# Patient Record
Sex: Female | Born: 1935 | Race: Black or African American | Hispanic: No | Marital: Single | Smoking: Former smoker
Health system: Southern US, Community
[De-identification: ages and names within clinical notes are randomized; demographics above are authoritative.]

## PROBLEM LIST (undated history)

## (undated) DIAGNOSIS — I639 Cerebral infarction, unspecified: Secondary | ICD-10-CM

## (undated) DIAGNOSIS — F32A Depression, unspecified: Secondary | ICD-10-CM

## (undated) DIAGNOSIS — F419 Anxiety disorder, unspecified: Secondary | ICD-10-CM

## (undated) DIAGNOSIS — J45909 Unspecified asthma, uncomplicated: Secondary | ICD-10-CM

## (undated) DIAGNOSIS — M069 Rheumatoid arthritis, unspecified: Secondary | ICD-10-CM

## (undated) DIAGNOSIS — K219 Gastro-esophageal reflux disease without esophagitis: Secondary | ICD-10-CM

## (undated) DIAGNOSIS — M810 Age-related osteoporosis without current pathological fracture: Secondary | ICD-10-CM

## (undated) DIAGNOSIS — T7840XA Allergy, unspecified, initial encounter: Secondary | ICD-10-CM

## (undated) DIAGNOSIS — I10 Essential (primary) hypertension: Secondary | ICD-10-CM

## (undated) DIAGNOSIS — F329 Major depressive disorder, single episode, unspecified: Secondary | ICD-10-CM

## (undated) DIAGNOSIS — E7800 Pure hypercholesterolemia, unspecified: Secondary | ICD-10-CM

## (undated) DIAGNOSIS — H9190 Unspecified hearing loss, unspecified ear: Secondary | ICD-10-CM

## (undated) DIAGNOSIS — M1990 Unspecified osteoarthritis, unspecified site: Secondary | ICD-10-CM

## (undated) HISTORY — DX: Unspecified osteoarthritis, unspecified site: M19.90

## (undated) HISTORY — PX: REPLACEMENT TOTAL KNEE BILATERAL: SUR1225

## (undated) HISTORY — DX: Allergy, unspecified, initial encounter: T78.40XA

## (undated) HISTORY — DX: Age-related osteoporosis without current pathological fracture: M81.0

## (undated) HISTORY — PX: WRIST SURGERY: SHX841

## (undated) HISTORY — PX: KNEE ARTHROPLASTY: SHX992

## (undated) HISTORY — DX: Anxiety disorder, unspecified: F41.9

## (undated) HISTORY — DX: Unspecified hearing loss, unspecified ear: H91.90

## (undated) HISTORY — DX: Rheumatoid arthritis, unspecified: M06.9

## (undated) HISTORY — DX: Unspecified asthma, uncomplicated: J45.909

## (undated) HISTORY — DX: Depression, unspecified: F32.A

## (undated) HISTORY — DX: Major depressive disorder, single episode, unspecified: F32.9

## (undated) HISTORY — PX: ABDOMINAL HYSTERECTOMY: SHX81

---

## 2014-10-02 ENCOUNTER — Other Ambulatory Visit: Payer: Self-pay

## 2014-10-02 DIAGNOSIS — Z1231 Encounter for screening mammogram for malignant neoplasm of breast: Secondary | ICD-10-CM

## 2014-10-22 ENCOUNTER — Emergency Department (INDEPENDENT_AMBULATORY_CARE_PROVIDER_SITE_OTHER)
Admission: EM | Admit: 2014-10-22 | Discharge: 2014-10-22 | Disposition: A | Discharge status: Home / Self Care | Payer: Medicare Other | Source: Home / Self Care | Attending: Family Medicine | Admitting: Family Medicine

## 2014-10-22 ENCOUNTER — Encounter (HOSPITAL_COMMUNITY): Payer: Self-pay

## 2014-10-22 DIAGNOSIS — K0500 Acute gingivitis, plaque induced: Secondary | ICD-10-CM | POA: Diagnosis not present

## 2014-10-22 HISTORY — DX: Cerebral infarction, unspecified: I63.9

## 2014-10-22 HISTORY — DX: Gastro-esophageal reflux disease without esophagitis: K21.9

## 2014-10-22 HISTORY — DX: Essential (primary) hypertension: I10

## 2014-10-22 HISTORY — DX: Pure hypercholesterolemia, unspecified: E78.00

## 2014-10-22 MED ORDER — CHLORHEXIDINE GLUCONATE 0.12 % MT SOLN: 15.0000 mL | Freq: Four times a day (QID) | OROMUCOSAL | Status: DC

## 2014-10-22 MED ORDER — CLINDAMYCIN HCL 150 MG PO CAPS: 150.0000 mg | ORAL_CAPSULE | Freq: Four times a day (QID) | ORAL | Status: DC

## 2014-10-22 NOTE — Discharge Instructions (Signed)
Take medicine as prescribed, see your dentist as soon as possible °

## 2014-10-22 NOTE — ED Provider Notes (Signed)
CSN: [id number]     Arrival date & time 10/22/14  1433 History   First MD Initiated Contact with Patient 10/22/14 1503     Chief Complaint  Patient presents with  . Dental Pain   (Consider location/radiation/quality/duration/timing/severity/associated sxs/prior Treatment) Patient is a 81 y.o. female presenting with tooth pain. The history is provided by the patient and a relative.  Dental Pain Location:  Upper Upper teeth location:  6/RU cuspid Quality:  Burning and throbbing Severity:  Mild Onset quality:  Sudden Duration:  4 days Progression:  Worsening Chronicity:  New Context: poor dentition   Context comment:  Denture polygrip irritation of gum trying to remove., now gum swollen.   Past Medical History  Diagnosis Date  . GERD (gastroesophageal reflux disease)   . Hypertension   . Stroke   . High cholesterol    History reviewed. No pertinent past surgical history. History reviewed. No pertinent family history. History  Substance Use Topics  . Smoking status: Never Smoker   . Smokeless tobacco: Not on file  . Alcohol Use: Not on file   OB History    No data available     Review of Systems  Constitutional: Negative.   HENT: Positive for dental problem.     Allergies  Penicillins  Home Medications   Prior to Admission medications   Medication Sig Start Date End Date Taking? Authorizing Provider  lisinopril (PRINIVIL,ZESTRIL) 10 MG tablet Take 10 mg by mouth daily.   Yes Historical Provider, MD  omeprazole (PRILOSEC) 10 MG capsule Take 10 mg by mouth daily.   Yes Historical Provider, MD  simvastatin (ZOCOR) 10 MG tablet Take 10 mg by mouth daily.   Yes Historical Provider, MD  thyroid (ARMOUR) 120 MG tablet Take 120 mg by mouth daily before breakfast.   Yes Historical Provider, MD  chlorhexidine (PERIDEX) 0.12 % solution Use as directed 15 mLs in the mouth or throat 4 (four) times daily. 10/22/14   Linna Hoff, MD  clindamycin (CLEOCIN) 150 MG capsule Take 1  capsule (150 mg total) by mouth 4 (four) times daily. 10/22/14   Linna Hoff, MD   BP 140/78 mmHg  Pulse 63  Temp(Src) 98.2 F (36.8 C) (Oral)  Resp 16  SpO2 100% Physical Exam  Constitutional: She appears well-developed and well-nourished. No distress.  HENT:  Right Ear: External ear normal.  Left Ear: External ear normal.  Mouth/Throat:    Nursing note and vitals reviewed.   ED Course  Procedures (including critical care time) Labs Review Labs Reviewed - No data to display  Imaging Review No results found.   MDM   1. Acute gingivitis        Linna Hoff, MD 10/22/14 1538

## 2014-10-22 NOTE — ED Notes (Signed)
Recently relocated  to area, but has no dentist. Has a lot of dental pain

## 2014-10-29 ENCOUNTER — Ambulatory Visit
Admission: RE | Admit: 2014-10-29 | Discharge: 2014-10-29 | Disposition: A | Discharge status: Home / Self Care | Payer: Medicare Other | Source: Ambulatory Visit

## 2014-10-29 DIAGNOSIS — Z1231 Encounter for screening mammogram for malignant neoplasm of breast: Secondary | ICD-10-CM

## 2015-03-31 ENCOUNTER — Emergency Department (HOSPITAL_COMMUNITY): Payer: Medicare Other

## 2015-03-31 ENCOUNTER — Emergency Department (HOSPITAL_COMMUNITY)
Admission: EM | Admit: 2015-03-31 | Discharge: 2015-03-31 | Disposition: A | Discharge status: Home / Self Care | Payer: Medicare Other | Attending: Emergency Medicine | Admitting: Emergency Medicine

## 2015-03-31 ENCOUNTER — Encounter (HOSPITAL_COMMUNITY): Payer: Self-pay | Admitting: Emergency Medicine

## 2015-03-31 DIAGNOSIS — H409 Unspecified glaucoma: Secondary | ICD-10-CM | POA: Diagnosis not present

## 2015-03-31 DIAGNOSIS — Z880 Allergy status to penicillin: Secondary | ICD-10-CM | POA: Diagnosis not present

## 2015-03-31 DIAGNOSIS — R51 Headache: Secondary | ICD-10-CM | POA: Diagnosis not present

## 2015-03-31 DIAGNOSIS — Z8673 Personal history of transient ischemic attack (TIA), and cerebral infarction without residual deficits: Secondary | ICD-10-CM | POA: Insufficient documentation

## 2015-03-31 DIAGNOSIS — J45901 Unspecified asthma with (acute) exacerbation: Secondary | ICD-10-CM | POA: Insufficient documentation

## 2015-03-31 DIAGNOSIS — R079 Chest pain, unspecified: Secondary | ICD-10-CM | POA: Diagnosis not present

## 2015-03-31 DIAGNOSIS — R109 Unspecified abdominal pain: Secondary | ICD-10-CM | POA: Diagnosis not present

## 2015-03-31 DIAGNOSIS — R0789 Other chest pain: Secondary | ICD-10-CM | POA: Diagnosis not present

## 2015-03-31 DIAGNOSIS — E782 Mixed hyperlipidemia: Secondary | ICD-10-CM | POA: Insufficient documentation

## 2015-03-31 DIAGNOSIS — Z79899 Other long term (current) drug therapy: Secondary | ICD-10-CM | POA: Insufficient documentation

## 2015-03-31 DIAGNOSIS — I10 Essential (primary) hypertension: Secondary | ICD-10-CM | POA: Insufficient documentation

## 2015-03-31 DIAGNOSIS — R0602 Shortness of breath: Secondary | ICD-10-CM | POA: Diagnosis not present

## 2015-03-31 DIAGNOSIS — Z7982 Long term (current) use of aspirin: Secondary | ICD-10-CM | POA: Insufficient documentation

## 2015-03-31 DIAGNOSIS — R001 Bradycardia, unspecified: Secondary | ICD-10-CM | POA: Insufficient documentation

## 2015-03-31 DIAGNOSIS — K219 Gastro-esophageal reflux disease without esophagitis: Secondary | ICD-10-CM | POA: Diagnosis not present

## 2015-03-31 LAB — I-STAT ARTERIAL BLOOD GAS, ED
Bicarbonate: 23.3 mEq/L (ref 20.0–24.0)
O2 Saturation: 98 %
TCO2: 24 mmol/L (ref 0–100)
pCO2 arterial: 32.5 mmHg — ABNORMAL LOW (ref 35.0–45.0)
pH, Arterial: 7.464 — ABNORMAL HIGH (ref 7.350–7.450)
pO2, Arterial: 103 mmHg — ABNORMAL HIGH (ref 80.0–100.0)

## 2015-03-31 LAB — CBC
HCT: 30 % — ABNORMAL LOW (ref 36.0–46.0)
Hemoglobin: 9.8 g/dL — ABNORMAL LOW (ref 12.0–15.0)
MCH: 21.3 pg — ABNORMAL LOW (ref 26.0–34.0)
MCHC: 32.7 g/dL (ref 30.0–36.0)
MCV: 65.1 fL — ABNORMAL LOW (ref 78.0–100.0)
Platelets: 406 10*3/uL — ABNORMAL HIGH (ref 150–400)
RBC: 4.61 MIL/uL (ref 3.87–5.11)
RDW: 18.4 % — ABNORMAL HIGH (ref 11.5–15.5)
WBC: 8.5 10*3/uL (ref 4.0–10.5)

## 2015-03-31 LAB — BASIC METABOLIC PANEL
Anion gap: 8 (ref 5–15)
BUN: 16 mg/dL (ref 6–20)
CO2: 26 mmol/L (ref 22–32)
Calcium: 9.4 mg/dL (ref 8.9–10.3)
Chloride: 102 mmol/L (ref 101–111)
Creatinine, Ser: 1.45 mg/dL — ABNORMAL HIGH (ref 0.44–1.00)
GFR calc Af Amer: 39 mL/min — ABNORMAL LOW (ref >60)
GFR calc non Af Amer: 33 mL/min — ABNORMAL LOW (ref >60)
Glucose, Bld: 78 mg/dL (ref 65–99)
Potassium: 4.7 mmol/L (ref 3.5–5.1)
Sodium: 136 mmol/L (ref 135–145)

## 2015-03-31 LAB — TROPONIN I
Troponin I: <0.03 ng/mL (ref <0.031)
Troponin I: <0.03 ng/mL (ref <0.031)

## 2015-03-31 LAB — D-DIMER, QUANTITATIVE: D-Dimer, Quant: 5.05 ug/mL-FEU — ABNORMAL HIGH (ref 0.00–0.48)

## 2015-03-31 MED ORDER — HYDROCODONE-ACETAMINOPHEN 5-325 MG PO TABS
1.0000 | ORAL_TABLET | Freq: Once | ORAL | Status: AC
  Administered 2015-03-31: 1 via ORAL
  Filled 2015-03-31: qty 1

## 2015-03-31 MED ORDER — HYDROCODONE-ACETAMINOPHEN 7.5-325 MG PO TABS: 1.0000 | ORAL_TABLET | Freq: Four times a day (QID) | ORAL | Status: DC | PRN

## 2015-03-31 MED ORDER — IOHEXOL 350 MG/ML SOLN
100.0000 mL | Freq: Once | INTRAVENOUS | Status: AC | PRN
  Administered 2015-03-31: 80 mL via INTRAVENOUS

## 2015-03-31 NOTE — ED Provider Notes (Signed)
CSN: [id number]     Arrival date & time 03/31/15  0731 History   First MD Initiated Contact with Patient 03/31/15 [phone]     Chief Complaint  Patient presents with  . Chest Pain   HPI:  Patient is a 81 y.o. female presenting with chest pain.  Chest Pain Pain location:  Substernal area Pain quality: pressure and sharp   Pain radiates to:  Does not radiate Pain radiates to the back: yes   Pain severity:  Mild Onset quality:  Sudden Duration:  1 day Timing:  Constant Chronicity:  New Context: at rest   Worsened by:  Coughing Associated symptoms: abdominal pain, cough, fatigue, headache and shortness of breath   Associated symptoms: no fever   Associated symptoms comment:  Cold Chest pain was relieved by Norco.  Patient does have history of GERD and takes medication daily. However, states that symptoms are still present despite medications.   Of note, patient says she has been stressed lately due to lifestyle changes. She lost her vision and is no longer able to dribve due to glaucoma.   Past Medical History  Diagnosis Date  . GERD (gastroesophageal reflux disease)   . Hypertension   . Stroke   . High cholesterol   Rheumatoid arthritis Asthma Glaucoma  History reviewed. No pertinent past surgical history. History reviewed. No pertinent family history. Social History  Substance Use Topics  . Smoking status: Never Smoker   . Smokeless tobacco: None  . Alcohol Use: No   OB History    No data available     Review of Systems  Constitutional: Positive for fatigue. Negative for fever.  Respiratory: Positive for cough and shortness of breath.   Cardiovascular: Positive for chest pain.  Gastrointestinal: Positive for abdominal pain.  Neurological: Positive for headaches.  Also per HPI  Allergies  Penicillins  Home Medications   Prior to Admission medications   Medication Sig Start Date End Date Taking? Authorizing Provider  albuterol (PROVENTIL HFA;VENTOLIN HFA) 108  (90 BASE) MCG/ACT inhaler Inhale 2 puffs into the lungs every 6 (six) hours as needed for wheezing or shortness of breath.   Yes Historical Provider, MD  aspirin 81 MG tablet Take 81 mg by mouth daily.   Yes Historical Provider, MD  brimonidine-timolol (COMBIGAN) 0.2-0.5 % ophthalmic solution Place 1 drop into both eyes every 12 (twelve) hours.   Yes Historical Provider, MD  Cholecalciferol (VITAMIN D PO) Take 1 tablet by mouth daily.   Yes Historical Provider, MD  etanercept (ENBREL) 50 MG/ML injection Inject 50 mg into the skin once a week.   Yes Historical Provider, MD  fosinopril (MONOPRIL) 40 MG tablet Take 40 mg by mouth daily.   Yes Historical Provider, MD  HYDROcodone-acetaminophen (NORCO) 7.5-325 MG per tablet Take 1 tablet by mouth every 6 (six) hours as needed for moderate pain.   Yes Historical Provider, MD  ibuprofen (ADVIL,MOTRIN) 200 MG tablet Take 800 mg by mouth every 6 (six) hours as needed (pain).   Yes Historical Provider, MD  latanoprost (XALATAN) 0.005 % ophthalmic solution Place 1 drop into both eyes at bedtime.   Yes Historical Provider, MD  levothyroxine (SYNTHROID, LEVOTHROID) 75 MCG tablet Take 75 mcg by mouth daily before breakfast.   Yes Historical Provider, MD  memantine (NAMENDA) 10 MG tablet Take 10 mg by mouth 2 (two) times daily.   Yes Historical Provider, MD  Multiple Vitamins-Minerals (MULTIVITAL PO) Take 1 tablet by mouth daily.   Yes Historical Provider, MD  omeprazole (PRILOSEC) 20 MG capsule Take 20 mg by mouth daily.   Yes Historical Provider, MD  simvastatin (ZOCOR) 40 MG tablet Take 40 mg by mouth daily.   Yes Historical Provider, MD   BP 145/61 mmHg  Pulse 54  Temp(Src) 97.3 F (36.3 C) (Oral)  Resp 15  SpO2 100% Physical Exam  Constitutional: She is oriented to person, place, and time. She appears well-developed and well-nourished. No distress.  Cardiovascular: Regular rhythm, normal heart sounds and normal pulses.  Bradycardia present.    Pulmonary/Chest: Effort normal and breath sounds normal. She exhibits tenderness.  Abdominal: Soft. Bowel sounds are normal. There is no splenomegaly or hepatomegaly. There is tenderness.  Musculoskeletal: Normal range of motion. She exhibits no edema.  Neurological: She is alert and oriented to person, place, and time.  Skin: Skin is warm and dry.    ED Course  Procedures (including critical care time) Labs Review Labs Reviewed  BASIC METABOLIC PANEL - Abnormal; Notable for the following:    Creatinine, Ser 1.45 (*)    GFR calc non Af Amer 33 (*)    GFR calc Af Amer 39 (*)    All other components within normal limits  CBC - Abnormal; Notable for the following:    Hemoglobin 9.8 (*)    HCT 30.0 (*)    MCV 65.1 (*)    MCH 21.3 (*)    RDW 18.4 (*)    Platelets 406 (*)    All other components within normal limits  D-DIMER, QUANTITATIVE (NOT AT [facility]) - Abnormal; Notable for the following:    D-Dimer, Quant 5.05 (*)    All other components within normal limits  I-STAT ARTERIAL BLOOD GAS, ED - Abnormal; Notable for the following:    pH, Arterial 7.464 (*)    pCO2 arterial 32.5 (*)    pO2, Arterial 103.0 (*)    All other components within normal limits  TROPONIN I    Imaging Review Dg Chest 2 View  03/31/2015   CLINICAL DATA:  Mid chest pain.  Shortness of breath.  EXAM: CHEST  2 VIEW  COMPARISON:  None.  FINDINGS: Enlarged cardiac silhouette and mediastinal contours with atherosclerotic plaque within the thoracic aorta. There is mild diffuse slightly nodular thickening of the pulmonary interstitium. There is a punctate (approximately 4 mm) nodular opacity overlying the peripheral aspect the right mid lung. No focal airspace opacities. There is mild elevation/eventration the anterior aspect the right hemidiaphragm. No pleural effusion or pneumothorax. No evidence of edema. No acute osseus abnormalities.  IMPRESSION: 1. Mild bronchitic change without acute cardiopulmonary disease. 2.  Indeterminate punctate (approximately 4 mm) nodular opacity overlying the peripheral aspect the right mid lung - potentially artifactual secondary to the confluence of overlying osseous structures though a discrete underlying pulmonary nodule is not excluded. Correlation with prior outside examinations (if available) is recommended. Otherwise, follow-up chest CT is recommended. These results will be called to the ordering clinician or representative by the Radiologist Assistant, and communication documented in the PACS or zVision Dashboard.   Electronically Signed   By: Simonne Come M.D.   On: 03/31/2015 09:55   I have personally reviewed and evaluated these images and lab results as part of my medical decision-making.   EKG Interpretation   Date/Time:  Tuesday March 31 2015 07:45:19 EDT Ventricular Rate:  46 PR Interval:  118 QRS Duration: 78 QT Interval:  472 QTC Calculation: 413 R Axis:   66 Text Interpretation:  Sinus bradycardia Borderline short  PR interval  Minimal ST elevation, inferior leads No old tracing to compare Confirmed  by CAMPOS  MD, KEVIN ([id number]) on 03/31/2015 7:53:01 AM      MDM   Final diagnoses:  Chest pain, unspecified chest pain type   Patient with chest pain. R/o ACS with negative troponin x2 and EKG without ischemic changes. CXR with no signs of active cardiopulmonary disease; does have signs of bronchitic changes and possible pulmonary nodule. D-dimer returned elevated. CT Angio ordered to r/o PE. Patient initially thought to be hypoxia but was not. Ambulated without drop in oxygen saturation.   Patient was noted to have anemia on blood work consistent with iron deficiency. Other labs unremarkable. Vitals stable.   Will send her home with list of providers in area so patient can follow-up with a PCP.    Caryl Ada, DO PGY-2, [facility]      Pincus Large, DO 03/31/15 1527  Azalia Bilis, MD 03/31/15 [phone]

## 2015-03-31 NOTE — ED Notes (Signed)
Pt given warm packs for hands -- pulse ox increased to 100% on room air.

## 2015-03-31 NOTE — Discharge Instructions (Signed)
Chest Pain (Nonspecific) It is often hard to give a diagnosis for the cause of chest pain. There is always a chance that your pain could be related to something serious, such as a heart attack or a blood clot in the lungs. You need to follow up with your doctor. HOME CARE  If antibiotic medicine was given, take it as directed by your doctor. Finish the medicine even if you start to feel better.  For the next few days, avoid activities that bring on chest pain. Continue physical activities as told by your doctor.  Do not use any tobacco products. This includes cigarettes, chewing tobacco, and e-cigarettes.  Avoid drinking alcohol.  Only take medicine as told by your doctor.  Follow your doctor's suggestions for more testing if your chest pain does not go away.  Keep all doctor visits you made. GET HELP IF:  Your chest pain does not go away, even after treatment.  You have a rash with blisters on your chest.  You have a fever. GET HELP RIGHT AWAY IF:   You have more pain or pain that spreads to your arm, neck, jaw, back, or belly (abdomen).  You have shortness of breath.  You cough more than usual or cough up blood.  You have very bad back or belly pain.  You feel sick to your stomach (nauseous) or throw up (vomit).  You have very bad weakness.  You pass out (faint).  You have chills. This is an emergency. Do not wait to see if the problems will go away. Call your local emergency services (911 in U.S.). Do not drive yourself to the hospital. MAKE SURE YOU:   Understand these instructions.  Will watch your condition.  Will get help right away if you are not doing well or get worse. Document Released: 12/28/2007 Document Revised: 07/16/2013 Document Reviewed: 12/28/2007 [facility] Patient Information 2015 [place], [place]. This information is not intended to replace advice given to you by your health care provider. Make sure you discuss any questions you have with  your health care provider.    Emergency Department Resource Guide 1) Find a Doctor and Pay Out of Pocket Although you won't have to find out who is covered by your insurance plan, it is a good idea to ask around and get recommendations. You will then need to call the office and see if the doctor you have chosen will accept you as a new patient and what types of options they offer for patients who are self-pay. Some doctors offer discounts or will set up payment plans for their patients who do not have insurance, but you will need to ask so you aren't surprised when you get to your appointment.  2) Contact Your Local Health Department Not all health departments have doctors that can see patients for sick visits, but many do, so it is worth a call to see if yours does. If you don't know where your local health department is, you can check in your phone book. The CDC also has a tool to help you locate your state's health department, and many state websites also have listings of all of their local health departments.  3) Find a Walk-in Clinic If your illness is not likely to be very severe or complicated, you may want to try a walk in clinic. These are popping up all over the country in pharmacies, drugstores, and shopping centers. They're usually staffed by nurse practitioners or physician assistants that have been  trained to treat common illnesses and complaints. They're usually fairly quick and inexpensive. However, if you have serious medical issues or chronic medical problems, these are probably not your best option.  No Primary Care Doctor: - Call Health Connect at  [phone] - they can help you locate a primary care doctor that  accepts your insurance, provides certain services, etc. - Physician Referral Service- [phone]  Chronic Pain Problems: Organization         Address  Phone   Notes  [facility]  [phone] Patients need to be referred by their primary care  doctor.   Medication Assistance: Organization         Address  Phone   Notes  [facility] Medication [facility] [address] [place], [place] [address] [phone] --Must be a resident of [facility] -- Must have NO insurance coverage whatsoever (no Medicaid/ Medicare, etc.) -- The pt. MUST have a primary care doctor that directs their care regularly and follows them in the community   [facility]  [phone]   [employer]  [phone]    Agencies that provide inexpensive medical care: Organization         Address  Phone   Notes  [facility]  [phone]   [facility]    [phone]   [facility] [address] [place], [place] [address] [phone]   [facility] [address] [place] [place] [phone]   Planned Parenthood    [phone]   [facility]    [phone]   [facility]  [address], [place] Phone:  [phone], Fax:  [phone] Hours of Operation:  9 am - 6 pm, M-F.  Also accepts Medicaid/Medicare and self-pay.  [facility]  [address], [place] Phone: [phone], Fax: [phone]. Hours of Operation:  8:30 am - 5:30 pm, M-F.  Also accepts Medicaid and self-pay.  [facility] [place] [address] [place] Phone: [phone]   [facility] [address] Natasha Bence [place], [place] [phone], Ext. [phone] Mondays & Thursdays: 7-9 AM.  First 15 patients are seen on a first come, first serve basis.    Medicaid-accepting [facility] Providers:  Organization         Address  Phone   Notes  [facility] [address], [place] [phone] Also accepts self-pay patients.  [facility] [address] Laurell Josephs [place], [place]  [phone]   [facility] [address], [place] [phone]   [facility] [address], [place] [phone]   Renaye Rakers [address], [place]   [phone] Only accepts [place] Access [place] patients after they have their name applied to their card.   Self-Pay (no insurance) in [facility]:  Organization         Address  Phone   Notes  Sickle Cell Patients, [facility] Internal Medicine [address] [place], [place] [phone]   [facility] [address] [place], [place] [phone]   [facility] [place]  [address], [place] [phone]   [facility]/Dr. Osei-Bonsu  [address], [place] or [place]  [address] Laure[address], [place] [phone] Phone number for both [facility] and [place] locations is the same.  [facility] [address], [place] [phone]   [facility] [address], [place] or [address] [phone]   [facility] [address] [place] [phone], phone; [phone], fax Sees patients 1st and 3rd Saturday of every month.  Must not qualify for public or private insurance (i.e. Medicaid, Medicare, [place] Health Choice, Veterans' Benefits)  Household income should be no more than 200% of the poverty level The clinic cannot treat you if you are pregnant or think you are pregnant  Sexually transmitted diseases are not treated at the clinic.    Dental Care: Organization         Address  Phone  Notes  [facility] Department of [facility] [address] [place], [place] [phone] Accepts children up to age 63 who are enrolled in [place] or [place] Health Choice; pregnant women with a Medicaid card; and children who have applied for Medicaid or [place] Health Choice, but were declined, whose parents can pay a reduced fee at time of  service.  [facility] of [facility]  [address], [place] [phone] Accepts children up to age 59 who are enrolled in [place] or [place] Health Choice; pregnant women with a Medicaid card; and children who have applied for Medicaid or [place] Health Choice, but were declined, whose parents can pay a reduced fee at time of service.  [facility]  [address] [place], [place] [phone] Patients are seen by appointment only. Walk-ins are not accepted. [facility] will see patients 53 years of age and older. Monday - Tuesday (8am-5pm) Most Wednesdays (8:30-5pm) $30 per visit, cash only  [facility]  [address], [facility] [phone] Patients are seen by appointment only. Walk-ins are not accepted. [facility] will see patients 20 years of age and older. One Wednesday Evening (Monthly: Volunteer Based).  $30 per visit, cash only  [employer]  [phone] for adults; Children under age 1, call Graduate Pediatric Dentistry at [phone]. Children aged 35-14, please call [phone] to request a pediatric application.  Dental services are provided in all areas of dental care including fillings, crowns and bridges, complete and partial dentures, implants, gum treatment, root canals, and extractions. Preventive care is also provided. Treatment is provided to both adults and children. Patients are selected via a lottery and there is often a waiting list.   [facility] [address], [place]  [phone] [email or web address]   [facility] [address] [place], [place] [phone], Ext. [phone] Second and Fourth Thursday of each month, opens at 6:30 AM; Clinic ends at 9 AM.  Patients are seen on a first-come first-served basis, and a limited number are seen during each clinic.   [facility]  [address] Ether Griffins [place],  [place] [phone]   Eligibility Requirements You must have lived in [place], [place], or [place] counties for at least the last three months.   You cannot be eligible for state or federal sponsored [place], including [employer], [place], or [employer].   You generally cannot be eligible for healthcare insurance through your employer.    How to apply: Eligibility screenings are  held every Tuesday and Wednesday afternoon from 1:00 pm until 4:00 pm. You do not need an appointment for the interview!  [facility] [address] [place], [place] [phone]   [facility] Health Department  [phone]   [facility] Health Department  [phone]   [facility] Health Department  [phone]    Behavioral Health Resources in the Community: Intensive Outpatient Programs Organization         Address  Phone  Notes  [facility] Services [address] [place], [place] [phone]   [facility] Outpatient [address] [place], [place] [phone]   [facility] [address], [place], [place]  [phone]   [facility] Mental Health [address],  [place], [place] [phone] or [phone]   Substance Abuse Resources Organization         Address  Phone  Notes  [facility]  [phone]   [facility]  [phone]   [facility] [place]  [phone]   Floydene Flock  [phone]   Residential & Outpatient Substance Abuse Program  [phone]   Psychological Services Organization         Address  Phone  Notes  [facility] Health  [phone]   [facility]  [phone]   [facility] Mental Health [address] [place] [phone] or [phone]    Mobile Crisis Teams Organization         Address  Phone  Notes  [facility], Mobile Crisis Care Unit  [phone]   [facility]  [address] [place], [place] [phone]   Doristine Locks [address] [place] [phone]    Self-Help/Support Groups Organization         Address  Phone             Notes  [facility] [place] - variety of support groups  [phone]- [phone] Call for more information  Narcotics Anonymous (NA), [facility] [address], [employer] [place]  2 meetings at this location   Statistician         Address  Phone  Notes  [facility] [address],    [place]  [phone]   [facility]  [address] [place] [address], [place], [place] [phone]   [facility] [address] [place], [place] [phone] Admissions: 8am-3pm M-F  [facility] [address]    [place], [place] [phone]   [facility] [address] [place], [place], [place] [phone]   [facility] [address],  [place], [place] [phone]   [facility] - Intensive Outpatient [address] Laurell [address], [place], [place] [phone]   [facility] ([facility] [address] [place]  [place], [place] [phone] or [phone]   [facility] ([facility]) [address], [place], [place] [phone] Accepts Medicaid  [facility] [address],  [place] [phone] Substance Abuse/Addiction Treatment   [facility] Organization         Address  Phone  Notes  [facility]  [phone]   Angie Fava, PhD [address] Ervin Knack [place], [place]   [phone] or [phone]   [facility] Behavioral   [address] [place] [address] [place], [place] [phone]   [facility] [address] [place], [place] [phone] Insurance/Medicaid/sponsorship through  [facility]  [facility] [address]                                    [place], [place] [phone]  Therapy/tele-psych/case  [facility] [address]   [place], [place] [phone]    Dr. Lolly Mustache  [phone]   [facility] [place]  [employer] [facility] Dept. 1) [address],  2) [address] [place] 3)  [address], [place] [phone]  [phone]   [facility] Child Abuse Hotline 902-124-6465 or [phone] (After Hours)     Chest Pain (Nonspecific) It is often hard to give a specific diagnosis for the cause of chest pain. There is always a chance that your pain could be related to something serious, such as a heart attack or a blood clot in the lungs. You need to follow up with your health care provider for further evaluation. CAUSES   Heartburn.  Pneumonia or bronchitis.  Anxiety or stress.  Inflammation around your heart (pericarditis) or lung (pleuritis or pleurisy).  A blood clot in the lung.  A collapsed lung (pneumothorax). It can develop suddenly on its own (spontaneous pneumothorax) or from trauma to the chest.  Shingles infection (herpes zoster virus). The chest wall is composed of bones, muscles, and cartilage. Any of these can be the source of the pain.  The bones can be bruised by injury.  The muscles or cartilage can be strained by coughing or overwork.  The cartilage can be affected by inflammation and become sore (costochondritis). DIAGNOSIS  Lab tests or other studies may be needed to find the cause of your pain. Your health care provider may have you take a test called an ambulatory electrocardiogram (ECG). An ECG records your heartbeat patterns over a 24-hour period. You may also have other tests, such as:  Transthoracic echocardiogram (TTE). During echocardiography, sound waves are used to evaluate how blood flows through your heart.  Transesophageal echocardiogram (TEE).  Cardiac monitoring. This allows your health care provider to monitor your heart rate and  rhythm in real time.  Holter monitor. This is a portable device that records your heartbeat and can help diagnose heart arrhythmias. It allows your health care provider to track your heart activity for several days, if needed.  Stress tests by exercise or by giving medicine that makes the heart beat faster. TREATMENT   Treatment depends on what may be causing your chest pain. Treatment may include:  Acid blockers for heartburn.  Anti-inflammatory medicine.  Pain medicine for inflammatory conditions.  Antibiotics if an infection is present.  You may be advised to change lifestyle habits. This includes stopping smoking and avoiding alcohol, caffeine, and chocolate.  You may be advised to keep your head raised (elevated) when sleeping. This reduces the chance of acid going backward from your stomach into your esophagus. Most of the time, nonspecific chest pain will improve within 2-3 days with rest and mild pain medicine.  HOME CARE INSTRUCTIONS   If antibiotics were prescribed, take them as directed. Finish them even if you start to feel better.  For the next few days, avoid physical activities that bring on chest pain. Continue physical activities as directed.  Do not use any tobacco products, including cigarettes, chewing tobacco, or electronic cigarettes.  Avoid drinking alcohol.  Only take medicine  as directed by your health care provider.  Follow your health care provider's suggestions for further testing if your chest pain does not go away.  Keep any follow-up appointments you made. If you do not go to an appointment, you could develop lasting (chronic) problems with pain. If there is any problem keeping an appointment, call to reschedule. SEEK MEDICAL CARE IF:   Your chest pain does not go away, even after treatment.  You have a rash with blisters on your chest.  You have a fever. SEEK IMMEDIATE MEDICAL CARE IF:   You have increased chest pain or pain that spreads to  your arm, neck, jaw, back, or abdomen.  You have shortness of breath.  You have an increasing cough, or you cough up blood.  You have severe back or abdominal pain.  You feel nauseous or vomit.  You have severe weakness.  You faint.  You have chills. This is an emergency. Do not wait to see if the pain will go away. Get medical help at once. Call your local emergency services (911 in [place].S.). Do not drive yourself to the hospital. MAKE SURE YOU:   Understand these instructions.  Will watch your condition.  Will get help right away if you are not doing well or get worse. Document Released: 04/20/2005 Document Revised: 07/16/2013 Document Reviewed: 02/14/2008 [facility] Patient Information 2015 [place], [place]. This information is not intended to replace advice given to you by your health care provider. Make sure you discuss any questions you have with your health care provider.

## 2015-03-31 NOTE — ED Notes (Signed)
Notified CT patient has an [place] IV left arm.

## 2015-03-31 NOTE — ED Notes (Signed)
Pt ambulated well, O2 was 100% while ambulating on room air.

## 2015-03-31 NOTE — ED Notes (Signed)
Pt returned from CT °

## 2015-03-31 NOTE — ED Notes (Signed)
Pt transported to CT ?

## 2015-03-31 NOTE — ED Notes (Signed)
Pt to ED with son, with multiple c/o's-- right jaw pain, with eating, poor apatite. Pt's son states pt has been complaining of chest pain, intermittently for the past few days. Pt has no local physician, moved here from [place] in December. Pt is alert/oriented x 4, w/d.  Pt's O2 sats on room air = 77%, placed on 3L/m/[place].  O2 sats increased to 100%.

## 2015-03-31 NOTE — ED Notes (Signed)
Pt used bedside commode without difficulty.

## 2015-06-10 DIAGNOSIS — H6121 Impacted cerumen, right ear: Secondary | ICD-10-CM | POA: Diagnosis not present

## 2015-06-10 DIAGNOSIS — M069 Rheumatoid arthritis, unspecified: Secondary | ICD-10-CM | POA: Diagnosis not present

## 2015-06-10 DIAGNOSIS — J069 Acute upper respiratory infection, unspecified: Secondary | ICD-10-CM | POA: Diagnosis not present

## 2015-06-17 DIAGNOSIS — H44512 Absolute glaucoma, left eye: Secondary | ICD-10-CM | POA: Diagnosis not present

## 2015-06-17 DIAGNOSIS — H401113 Primary open-angle glaucoma, right eye, severe stage: Secondary | ICD-10-CM | POA: Diagnosis not present

## 2015-06-17 DIAGNOSIS — H548 Legal blindness, as defined in USA: Secondary | ICD-10-CM | POA: Diagnosis not present

## 2015-08-25 DIAGNOSIS — E039 Hypothyroidism, unspecified: Secondary | ICD-10-CM | POA: Diagnosis not present

## 2015-08-25 DIAGNOSIS — H6123 Impacted cerumen, bilateral: Secondary | ICD-10-CM | POA: Diagnosis not present

## 2015-08-25 DIAGNOSIS — M2550 Pain in unspecified joint: Secondary | ICD-10-CM | POA: Diagnosis not present

## 2015-08-25 DIAGNOSIS — E782 Mixed hyperlipidemia: Secondary | ICD-10-CM | POA: Diagnosis not present

## 2015-08-25 DIAGNOSIS — I10 Essential (primary) hypertension: Secondary | ICD-10-CM | POA: Diagnosis not present

## 2015-10-14 DIAGNOSIS — E039 Hypothyroidism, unspecified: Secondary | ICD-10-CM | POA: Diagnosis not present

## 2015-10-14 DIAGNOSIS — M069 Rheumatoid arthritis, unspecified: Secondary | ICD-10-CM | POA: Diagnosis not present

## 2015-10-14 DIAGNOSIS — Z0000 Encounter for general adult medical examination without abnormal findings: Secondary | ICD-10-CM | POA: Diagnosis not present

## 2015-12-18 DIAGNOSIS — M79672 Pain in left foot: Secondary | ICD-10-CM | POA: Diagnosis not present

## 2015-12-18 DIAGNOSIS — Z79899 Other long term (current) drug therapy: Secondary | ICD-10-CM | POA: Diagnosis not present

## 2015-12-18 DIAGNOSIS — R300 Dysuria: Secondary | ICD-10-CM | POA: Diagnosis not present

## 2015-12-18 DIAGNOSIS — M79641 Pain in right hand: Secondary | ICD-10-CM | POA: Diagnosis not present

## 2015-12-18 DIAGNOSIS — R5381 Other malaise: Secondary | ICD-10-CM | POA: Diagnosis not present

## 2015-12-18 DIAGNOSIS — M79642 Pain in left hand: Secondary | ICD-10-CM | POA: Diagnosis not present

## 2015-12-18 DIAGNOSIS — R52 Pain, unspecified: Secondary | ICD-10-CM | POA: Diagnosis not present

## 2015-12-18 DIAGNOSIS — M25552 Pain in left hip: Secondary | ICD-10-CM | POA: Diagnosis not present

## 2015-12-18 DIAGNOSIS — M25551 Pain in right hip: Secondary | ICD-10-CM | POA: Diagnosis not present

## 2015-12-18 DIAGNOSIS — M25511 Pain in right shoulder: Secondary | ICD-10-CM | POA: Diagnosis not present

## 2015-12-18 DIAGNOSIS — M25572 Pain in left ankle and joints of left foot: Secondary | ICD-10-CM | POA: Diagnosis not present

## 2015-12-18 DIAGNOSIS — M2550 Pain in unspecified joint: Secondary | ICD-10-CM | POA: Diagnosis not present

## 2015-12-18 DIAGNOSIS — M79671 Pain in right foot: Secondary | ICD-10-CM | POA: Diagnosis not present

## 2015-12-23 DIAGNOSIS — Z79899 Other long term (current) drug therapy: Secondary | ICD-10-CM | POA: Diagnosis not present

## 2015-12-23 DIAGNOSIS — R5381 Other malaise: Secondary | ICD-10-CM | POA: Diagnosis not present

## 2015-12-23 DIAGNOSIS — R52 Pain, unspecified: Secondary | ICD-10-CM | POA: Diagnosis not present

## 2015-12-23 DIAGNOSIS — M2550 Pain in unspecified joint: Secondary | ICD-10-CM | POA: Diagnosis not present

## 2015-12-23 DIAGNOSIS — R300 Dysuria: Secondary | ICD-10-CM | POA: Diagnosis not present

## 2015-12-24 DIAGNOSIS — N390 Urinary tract infection, site not specified: Secondary | ICD-10-CM | POA: Diagnosis not present

## 2015-12-24 DIAGNOSIS — E039 Hypothyroidism, unspecified: Secondary | ICD-10-CM | POA: Diagnosis not present

## 2015-12-24 DIAGNOSIS — M2550 Pain in unspecified joint: Secondary | ICD-10-CM | POA: Diagnosis not present

## 2016-01-20 DIAGNOSIS — M25561 Pain in right knee: Secondary | ICD-10-CM | POA: Diagnosis not present

## 2016-01-20 DIAGNOSIS — M0579 Rheumatoid arthritis with rheumatoid factor of multiple sites without organ or systems involvement: Secondary | ICD-10-CM | POA: Diagnosis not present

## 2016-01-20 DIAGNOSIS — Z09 Encounter for follow-up examination after completed treatment for conditions other than malignant neoplasm: Secondary | ICD-10-CM | POA: Diagnosis not present

## 2016-01-20 DIAGNOSIS — N390 Urinary tract infection, site not specified: Secondary | ICD-10-CM | POA: Diagnosis not present

## 2016-01-20 DIAGNOSIS — M79641 Pain in right hand: Secondary | ICD-10-CM | POA: Diagnosis not present

## 2016-06-28 ENCOUNTER — Other Ambulatory Visit: Payer: Self-pay | Admitting: Rheumatology

## 2016-06-30 ENCOUNTER — Telehealth: Payer: Self-pay | Admitting: Rheumatology

## 2016-06-30 NOTE — Telephone Encounter (Signed)
Last Visit: 03/15/16 Next Visit: 08/18/16 Labs: 06/27/16 WNL  Okay to refill Enbrel?

## 2016-06-30 NOTE — Telephone Encounter (Signed)
Prescription sent to the pharmacy.

## 2016-06-30 NOTE — Telephone Encounter (Signed)
Ok, Pl check TB gold status.

## 2016-06-30 NOTE — Telephone Encounter (Signed)
Patient's daughter called to let you know pt's PCP will be faxing [place] her lab results.

## 2016-06-30 NOTE — Telephone Encounter (Signed)
Please send in rx.  

## 2016-07-01 NOTE — Telephone Encounter (Signed)
TB Gold: 12/2015 Neg

## 2016-07-01 NOTE — Telephone Encounter (Signed)
ok 

## 2016-07-25 ENCOUNTER — Other Ambulatory Visit: Payer: Self-pay | Admitting: Rheumatology

## 2016-07-26 NOTE — Telephone Encounter (Signed)
Last Visit: 03/15/16 Next Visit: 08/18/16 Labs: 06/27/16 WNL Ok to refill per Dr Corliss Skains

## 2016-07-28 ENCOUNTER — Other Ambulatory Visit: Payer: Self-pay | Admitting: Rheumatology

## 2016-07-29 NOTE — Telephone Encounter (Signed)
Last Visit: 03/15/16 Next visit: 08/18/16 Labs: 06/27/16 WNL TB Gold: 12/24/15 Neg  Okay to refill Enbrel?

## 2016-08-17 DIAGNOSIS — Z8669 Personal history of other diseases of the nervous system and sense organs: Secondary | ICD-10-CM | POA: Insufficient documentation

## 2016-08-17 DIAGNOSIS — M47816 Spondylosis without myelopathy or radiculopathy, lumbar region: Secondary | ICD-10-CM | POA: Insufficient documentation

## 2016-08-17 DIAGNOSIS — Z8709 Personal history of other diseases of the respiratory system: Secondary | ICD-10-CM | POA: Insufficient documentation

## 2016-08-17 DIAGNOSIS — Z96653 Presence of artificial knee joint, bilateral: Secondary | ICD-10-CM | POA: Insufficient documentation

## 2016-08-17 DIAGNOSIS — Z79899 Other long term (current) drug therapy: Secondary | ICD-10-CM | POA: Insufficient documentation

## 2016-08-17 DIAGNOSIS — M0579 Rheumatoid arthritis with rheumatoid factor of multiple sites without organ or systems involvement: Secondary | ICD-10-CM | POA: Insufficient documentation

## 2016-08-17 DIAGNOSIS — Z8639 Personal history of other endocrine, nutritional and metabolic disease: Secondary | ICD-10-CM | POA: Insufficient documentation

## 2016-08-17 DIAGNOSIS — Z87898 Personal history of other specified conditions: Secondary | ICD-10-CM | POA: Insufficient documentation

## 2016-08-17 DIAGNOSIS — Z8679 Personal history of other diseases of the circulatory system: Secondary | ICD-10-CM | POA: Insufficient documentation

## 2016-08-17 DIAGNOSIS — Z8719 Personal history of other diseases of the digestive system: Secondary | ICD-10-CM | POA: Insufficient documentation

## 2016-08-17 NOTE — Progress Notes (Signed)
Office Visit Note  Patient: Jordan [name]             Date of Birth: [birth year]           MRN: [id number]             PCP: Zachery Dauer, FNP Referring: Zachery Dauer, FNP Visit Date: 08/18/2016 Occupation: @GUAROCC @    Subjective:  Left knee pain   History of Present Illness: Jordan [name] is a 83 y.o. female with history of rheumatoid arthritis. She states she's been in a lot of discomfort. She describes pain in her elbow left wrist and left hand. Left knee has been painful as well. She continues to have some lower back pain. Her left knee joint has been swollen. She's been having intermittent swelling of her left hand and left wrist as well.  Activities of Daily Living:  Patient reports morning stiffness for 2 hours.   Patient Reports nocturnal pain.  Difficulty dressing/grooming: Reports Difficulty climbing stairs: Reports Difficulty getting out of chair: Reports Difficulty using hands for taps, buttons, cutlery, and/or writing: reports   Review of Systems  Constitutional: Positive for fatigue. Negative for night sweats, weight gain, weight loss and weakness.  HENT: Negative for mouth sores, trouble swallowing, trouble swallowing, mouth dryness and nose dryness.   Eyes: Negative for pain, redness, visual disturbance and dryness.  Respiratory: Negative for cough, shortness of breath and difficulty breathing.   Cardiovascular: Negative for chest pain, palpitations, hypertension, irregular heartbeat and swelling in legs/feet.  Gastrointestinal: Positive for constipation. Negative for blood in stool and diarrhea.  Endocrine: Negative for increased urination.  Genitourinary: Negative for vaginal dryness.  Musculoskeletal: Positive for arthralgias, joint pain, joint swelling and morning stiffness. Negative for myalgias, muscle weakness, muscle tenderness and myalgias.  Skin: Negative for color change, rash, hair loss, skin tightness, ulcers and sensitivity to sunlight.    Allergic/Immunologic: Negative for susceptible to infections.  Neurological: Negative for dizziness, memory loss and night sweats.  Hematological: Negative for swollen glands.  Psychiatric/Behavioral: Positive for depressed mood and sleep disturbance. The patient is not nervous/anxious.     PMFS History:  Patient Active Problem List   Diagnosis Date Noted  . Rheumatoid arthritis involving multiple sites with positive rheumatoid factor (HCC) 08/17/2016  . High risk medication use 08/17/2016  . H/O total knee replacement, bilateral 08/17/2016  . Spondylosis of lumbar region without myelopathy or radiculopathy 08/17/2016  . History of glaucoma/ patient is legally blind 08/17/2016  . History of gastroesophageal reflux (GERD) 08/17/2016  . History of diverticulosis 08/17/2016  . History of hypothyroidism 08/17/2016  . history of asthmatic bronchitis 08/17/2016  . History of memory loss 08/17/2016  . History of atherosclerosis 08/17/2016    Past Medical History:  Diagnosis Date  . GERD (gastroesophageal reflux disease)   . High cholesterol   . Hypertension   . Stroke [facility])     History reviewed. No pertinent family history. Past Surgical History:  Procedure Laterality Date  . ABDOMINAL HYSTERECTOMY    . REPLACEMENT TOTAL KNEE BILATERAL Bilateral   . WRIST SURGERY Left    Social History   Social History Narrative  . No narrative on file     Objective: Vital Signs: BP (!) 172/74 (BP Location: Left Arm, Patient Position: Sitting, Cuff Size: Small)   Pulse (!) 51   Resp 12   Ht 5\' 1"  (1.549 m)   Wt 128 lb (58.1 kg)   BMI 24.19 kg/m    Physical Exam  Constitutional: She is oriented to person, place, and time. She appears well-developed and well-nourished.  HENT:  Head: Normocephalic and atraumatic.  Eyes: Conjunctivae and EOM are normal.  Neck: Normal range of motion.  Cardiovascular: Normal rate, regular rhythm, normal heart sounds and intact distal pulses.    Pulmonary/Chest: Effort normal and breath sounds normal.  Abdominal: Soft. Bowel sounds are normal.  Lymphadenopathy:    She has no cervical adenopathy.  Neurological: She is alert and oriented to person, place, and time.  Skin: Skin is warm and dry. Capillary refill takes less than 2 seconds.  Psychiatric: She has a normal mood and affect. Her behavior is normal.  Nursing note and vitals reviewed.    Musculoskeletal Exam: C-spine and thoracic spine limited range of motion. She has good range of motion of her shoulder joints and elbow joints. She has some swelling on palpation of bilateral wrist joints bilateral MCPs and PIP joints as shown. She has bilateral total knee replacement. Some warmth in the left knee joint was noted. Ankle joints are good range of motion.  CDAI Exam: CDAI Homunculus Exam:   Tenderness:  RUE: wrist LUE: ulnohumeral and radiohumeral and wrist Right hand: 2nd MCP, 3rd MCP and 3rd PIP Left hand: 2nd MCP and 3rd MCP LLE: tibiofemoral  Swelling:  RUE: wrist LUE: wrist Right hand: 3rd MCP and 3rd PIP Left hand: 2nd MCP  Joint Counts:  CDAI Tender Joint count: 9 CDAI Swollen Joint count: 5  Global Assessments:  Patient Global Assessment: 8 Provider Global Assessment: 6  CDAI Calculated Score: 28    Investigation: Findings:   Labs from 03/11/2016 show CBC with diff is normal except for moderate anemia with hemoglobin at 9.4, hematocrit at 29.8.  CMP with GFR is normal except for elevated creatinine at 1.01 and decreased albumin at 3.5.  As a result of the abnormal kidney function, we will decrease her methotrexate to 4 per week, and we educated the patient at length regarding this, and patient is agreeable.  Labs from 12/18/2015 shows CMP with GFR normal except for AST low at 4.  CBC with diff is normal except for hemoglobin slightly low at 10.  MCV low at 68.  Hematocrit low at 32.  MCH low at 22.  Sed rate is elevated at 105.  Acute hepatitis panel  is negative.  HIV 1 and 2 are negative.  IgA, IgM are normal but IgG is elevated at 1886.  SPEP is negative.  Urinalysis shows a positive nitrite and positive leukocyte estrace.  Microscopic shows many bacteria and 10-20 WBCs.  TB Gold is negative.  12/18/2015 X-ray of her left ankle joint, 2 views today, was within normal limits with a small calcaneal spur.  Bilateral feet x-ray showed PIP/DIP narrowing, bilateral 1st MTP narrowing, but no erosive changes were noted.  There was a questionable cyst or erosive change in her right 1st MTP joint and screws were noted in bilateral 1st MTP joint from bunionectomy.  Bilateral hand x-rays showed bilateral PIP/DIP narrowing.  No MCP changes.  Mild intracarpal joint space and narrowing and right radius hardware was noted.  Left hip joint x-ray, 2 views, were within normal limits.      Imaging: No results found.  Speciality Comments: No specialty comments available.    Procedures:  No procedures performed Allergies: Penicillins   Assessment / Plan:     Visit Diagnoses: Rheumatoid arthritis involving multiple sites with positive rheumatoid factor (HCC) - RF positive, CCP positive. She she is  having a flare with increased pain and swelling in multiple joints. We discussed a short course of prednisone taper. I spoke with patient and her daughter on her phone and also her knees to complete her. Indications CONTRAINDICATIONS were discussed. The plan is to start her on prednisone 20 mg for 2 days, 15 for 2 days, 10 for 2 days, 5 for 2 days and 2.5 mg for 2 days.  High risk medication use - Enbrel 50 mg sq qwk, MTX 4 tablets every week, Folic acid . Her labs from 06/27/2016 were within normal limits which were done by her PCP. Her TB gold was done in May 2017. We will check her labs and March and every 3 months to monitor for drug toxicity.  H/O total knee replacement, bilateral: She's been having increased pain in her left knee.  DDD Lumbar spine: She has  chronic lower back pain.  History of glaucoma/ patient is legally blind  Iridocyclitis - Chronic  History of gastroesophageal reflux (GERD)  History of diverticulosis  History of hypothyroidism  history of asthmatic bronchitis  History of memory loss  History of atherosclerosis  Her other medical problems include history of hypertension, dyslipidemia, chronic anemia, lactose intolerance, abdominal aneurysm, history of bunionectomy history of right wrist fracture  Orders: Orders Placed This Encounter  Procedures  . CBC with Differential/Platelet  . COMPLETE METABOLIC PANEL WITH GFR   Meds ordered this encounter  Medications  . predniSONE (DELTASONE) 5 MG tablet    Sig: Take 20 mg x 2 days, then 15 mg x 2 days, then 10 mg x 2 days, 5 mg x 2 days then 2.5 mg x 2 days.    Dispense:  21 tablet    Refill:  0    Face-to-face time spent with patient was 30 minutes. 50% of time was spent in counseling and coordination of care.  Follow-Up Instructions: Return in about 4 months (around 12/16/2016) for Rheumatoid arthritis.   Pollyann Savoy, MD  Note - This record has been created using Animal nutritionist.  Chart creation errors have been sought, but may not always  have been located. Such creation errors do not reflect on  the standard of medical care.

## 2016-08-18 ENCOUNTER — Ambulatory Visit (INDEPENDENT_AMBULATORY_CARE_PROVIDER_SITE_OTHER): Payer: Medicare Other | Admitting: Rheumatology

## 2016-08-18 ENCOUNTER — Encounter: Payer: Self-pay | Admitting: Rheumatology

## 2016-08-18 VITALS — BP 172/74 | HR 51 | Resp 12 | Ht 61.0 in | Wt 128.0 lb

## 2016-08-18 DIAGNOSIS — Z8679 Personal history of other diseases of the circulatory system: Secondary | ICD-10-CM

## 2016-08-18 DIAGNOSIS — H209 Unspecified iridocyclitis: Secondary | ICD-10-CM | POA: Diagnosis not present

## 2016-08-18 DIAGNOSIS — M0579 Rheumatoid arthritis with rheumatoid factor of multiple sites without organ or systems involvement: Secondary | ICD-10-CM

## 2016-08-18 DIAGNOSIS — Z87898 Personal history of other specified conditions: Secondary | ICD-10-CM

## 2016-08-18 DIAGNOSIS — Z8669 Personal history of other diseases of the nervous system and sense organs: Secondary | ICD-10-CM

## 2016-08-18 DIAGNOSIS — Z8659 Personal history of other mental and behavioral disorders: Secondary | ICD-10-CM

## 2016-08-18 DIAGNOSIS — Z8709 Personal history of other diseases of the respiratory system: Secondary | ICD-10-CM

## 2016-08-18 DIAGNOSIS — Z79899 Other long term (current) drug therapy: Secondary | ICD-10-CM | POA: Diagnosis not present

## 2016-08-18 DIAGNOSIS — Z96653 Presence of artificial knee joint, bilateral: Secondary | ICD-10-CM

## 2016-08-18 DIAGNOSIS — M47816 Spondylosis without myelopathy or radiculopathy, lumbar region: Secondary | ICD-10-CM | POA: Diagnosis not present

## 2016-08-18 DIAGNOSIS — Z8719 Personal history of other diseases of the digestive system: Secondary | ICD-10-CM

## 2016-08-18 DIAGNOSIS — Z8639 Personal history of other endocrine, nutritional and metabolic disease: Secondary | ICD-10-CM | POA: Diagnosis not present

## 2016-08-18 MED ORDER — PREDNISONE 5 MG PO TABS: ORAL_TABLET | ORAL | 0 refills | Status: DC

## 2016-08-18 NOTE — Patient Instructions (Signed)
Standing Labs We placed an order today for your standing lab work.    Please come back and get your standing labs in March and every 3 months  We have open lab Monday through Friday from 8:30-11:30 AM and 1:30-4 PM at the office of Dr. Sameka Bagent/Naitik Panwala, PA.   The office is located at [address], [address], [place], [place] [address] No appointment is necessary.   Labs are drawn by [facility].  You may receive a bill from [facility] for your lab work.    

## 2016-08-26 ENCOUNTER — Other Ambulatory Visit: Payer: Self-pay | Admitting: Rheumatology

## 2016-08-26 NOTE — Telephone Encounter (Signed)
Last Visit: 08/18/16 Next Visit: 12/14/16 Labs: 06/27/16 WNL TB Gold: 11/2015 neg  Okay to refill Enbrel?

## 2016-08-29 ENCOUNTER — Other Ambulatory Visit: Payer: Self-pay | Admitting: Rheumatology

## 2016-08-29 NOTE — Telephone Encounter (Signed)
Last Visit: 08/18/16 Next Visit: 12/14/16 Labs: 06/27/16 WNL  Okay to refill MTX?

## 2016-10-03 ENCOUNTER — Other Ambulatory Visit: Payer: Self-pay | Admitting: Rheumatology

## 2016-10-04 NOTE — Telephone Encounter (Signed)
Last Visit: 08/18/16 Next Visit: 12/14/16 Labs: 06/27/16 WNL TB Gold: 11/2015 neg  Left message for patient to advise her she is due for labs.   Okay to refill Enbrel?

## 2016-10-04 NOTE — Telephone Encounter (Signed)
ok 

## 2016-10-05 ENCOUNTER — Other Ambulatory Visit: Payer: Self-pay | Admitting: *Deleted

## 2016-10-05 DIAGNOSIS — Z79899 Other long term (current) drug therapy: Secondary | ICD-10-CM

## 2016-10-05 LAB — CBC WITH DIFFERENTIAL/PLATELET
Basophils Absolute: 0 cells/uL (ref 0–200)
Basophils Relative: 0 %
Eosinophils Absolute: 59 cells/uL (ref 15–500)
Eosinophils Relative: 1 %
HCT: 37.2 % (ref 35.0–45.0)
Hemoglobin: 12.1 g/dL (ref 11.7–15.5)
Lymphocytes Relative: 49 %
Lymphs Abs: 2891 cells/uL (ref 850–3900)
MCH: 24.3 pg — ABNORMAL LOW (ref 27.0–33.0)
MCHC: 32.5 g/dL (ref 32.0–36.0)
MCV: 74.7 fL — ABNORMAL LOW (ref 80.0–100.0)
MPV: 10.4 fL (ref 7.5–12.5)
Monocytes Absolute: 590 cells/uL (ref 200–950)
Monocytes Relative: 10 %
Neutro Abs: 2360 cells/uL (ref 1500–7800)
Neutrophils Relative %: 40 %
Platelets: 194 10*3/uL (ref 140–400)
RBC: 4.98 MIL/uL (ref 3.80–5.10)
RDW: 18.2 % — ABNORMAL HIGH (ref 11.0–15.0)
WBC: 5.9 10*3/uL (ref 3.8–10.8)

## 2016-10-05 LAB — COMPLETE METABOLIC PANEL WITH GFR
ALT: 7 U/L (ref 6–29)
AST: 15 U/L (ref 10–35)
Albumin: 4.1 g/dL (ref 3.6–5.1)
Alkaline Phosphatase: 48 U/L (ref 33–130)
BUN: 17 mg/dL (ref 7–25)
CO2: 31 mmol/L (ref 20–31)
Calcium: 10 mg/dL (ref 8.6–10.4)
Chloride: 102 mmol/L (ref 98–110)
Creat: 1.2 mg/dL — ABNORMAL HIGH (ref 0.60–0.88)
GFR, Est African American: 49 mL/min — ABNORMAL LOW (ref >=60)
GFR, Est Non African American: 43 mL/min — ABNORMAL LOW (ref >=60)
Glucose, Bld: 90 mg/dL (ref 65–99)
Potassium: 4.9 mmol/L (ref 3.5–5.3)
Sodium: 139 mmol/L (ref 135–146)
Total Bilirubin: 0.6 mg/dL (ref 0.2–1.2)
Total Protein: 7.3 g/dL (ref 6.1–8.1)

## 2016-10-05 NOTE — Progress Notes (Signed)
Labs stable

## 2016-10-10 DIAGNOSIS — I10 Essential (primary) hypertension: Secondary | ICD-10-CM | POA: Diagnosis not present

## 2016-10-10 DIAGNOSIS — E782 Mixed hyperlipidemia: Secondary | ICD-10-CM | POA: Diagnosis not present

## 2016-10-10 DIAGNOSIS — E039 Hypothyroidism, unspecified: Secondary | ICD-10-CM | POA: Diagnosis not present

## 2016-10-10 DIAGNOSIS — J309 Allergic rhinitis, unspecified: Secondary | ICD-10-CM | POA: Diagnosis not present

## 2016-10-24 ENCOUNTER — Other Ambulatory Visit: Payer: Self-pay | Admitting: Rheumatology

## 2016-10-25 ENCOUNTER — Other Ambulatory Visit: Payer: Self-pay | Admitting: Rheumatology

## 2016-10-25 MED ORDER — METHOTREXATE 2.5 MG PO TABS: 10.0000 mg | ORAL_TABLET | ORAL | 0 refills | Status: DC

## 2016-10-25 NOTE — Telephone Encounter (Signed)
Last Visit: 08/18/16 Next Visit: 12/14/16 Labs: 10/05/16 Stable TB Gold: 11/2015 neg  Okay to refill Enbrel?

## 2016-10-25 NOTE — Telephone Encounter (Signed)
ok 

## 2016-10-25 NOTE — Telephone Encounter (Signed)
Last Visit: 08/18/16 Next Visit: 12/14/16 Labs: 10/05/16 Stable  Okay to refill MTX?

## 2016-10-25 NOTE — Telephone Encounter (Signed)
Patient's daughter called requesting refill of the medication the patient takes on Thursday (she takes 4 pills every Thursday). She did not know the name of the medication or anything else about it. Please send to [facility] on [address]

## 2016-11-22 ENCOUNTER — Ambulatory Visit (INDEPENDENT_AMBULATORY_CARE_PROVIDER_SITE_OTHER): Payer: Medicare Other | Admitting: Rheumatology

## 2016-11-22 ENCOUNTER — Encounter: Payer: Self-pay | Admitting: Rheumatology

## 2016-11-22 VITALS — BP 90/45 | HR 50 | Resp 15 | Ht 60.0 in | Wt 111.0 lb

## 2016-11-22 DIAGNOSIS — M0579 Rheumatoid arthritis with rheumatoid factor of multiple sites without organ or systems involvement: Secondary | ICD-10-CM | POA: Diagnosis not present

## 2016-11-22 DIAGNOSIS — M65342 Trigger finger, left ring finger: Secondary | ICD-10-CM

## 2016-11-22 DIAGNOSIS — M47816 Spondylosis without myelopathy or radiculopathy, lumbar region: Secondary | ICD-10-CM

## 2016-11-22 DIAGNOSIS — Z8639 Personal history of other endocrine, nutritional and metabolic disease: Secondary | ICD-10-CM

## 2016-11-22 DIAGNOSIS — Z8679 Personal history of other diseases of the circulatory system: Secondary | ICD-10-CM | POA: Diagnosis not present

## 2016-11-22 DIAGNOSIS — Z8719 Personal history of other diseases of the digestive system: Secondary | ICD-10-CM | POA: Diagnosis not present

## 2016-11-22 DIAGNOSIS — Z8669 Personal history of other diseases of the nervous system and sense organs: Secondary | ICD-10-CM | POA: Diagnosis not present

## 2016-11-22 DIAGNOSIS — Z96653 Presence of artificial knee joint, bilateral: Secondary | ICD-10-CM | POA: Diagnosis not present

## 2016-11-22 DIAGNOSIS — M65332 Trigger finger, left middle finger: Secondary | ICD-10-CM | POA: Diagnosis not present

## 2016-11-22 DIAGNOSIS — Z87898 Personal history of other specified conditions: Secondary | ICD-10-CM | POA: Diagnosis not present

## 2016-11-22 DIAGNOSIS — Z79899 Other long term (current) drug therapy: Secondary | ICD-10-CM | POA: Diagnosis not present

## 2016-11-22 DIAGNOSIS — Z8709 Personal history of other diseases of the respiratory system: Secondary | ICD-10-CM | POA: Diagnosis not present

## 2016-11-22 MED ORDER — FOLIC ACID 1 MG PO TABS: 1.0000 mg | ORAL_TABLET | Freq: Every day | ORAL | 3 refills | Status: DC

## 2016-11-22 NOTE — Progress Notes (Signed)
Rheumatology Medication Review by a Pharmacist Treva is a pleasant 83 yo F who presents for follow up of rheumatoid arthritis.  She has dementia and has very poor recall of her medications.  Patient is accompanied by her niece, Burna Mortimer, who reports patient's son comes to the house every day to give patient her medications.  Patient's son was at work and was unable to talk during patient's visit.  I have asked patient to bring all of her medications or a list with her to her next appointment.  Patient voiced understanding. Patient reports she is taking Enbrel 50 mg every week and methotrexate 4 tablets weekly.  She cannot recall if she is taking folic acid.  Will send in folic acid refill today.  Counseled patient on importance of taking folic acid daily with methotrexate.  Most recent standing labs were on 10/05/16.  She will be due for standing labs again in June 2018.  Most recent TB Gold negative on 12/18/15.  Will get TB Gold with next labs.  Patient denies any questions or concerns regarding her medications at this time.  Lilla Shook, Pharm.D., BCPS, CPP Clinical Pharmacist Pager: [phone] Phone: [phone] 11/22/2016 12:33 PM

## 2016-11-22 NOTE — Patient Instructions (Signed)
Standing Labs We placed an order today for your standing lab work.    Please come back and get your standing labs in June 2018 and every 3 months.    We have open lab Monday through Friday from 8:30-11:30 AM and 1:30-4 PM at the office of Dr. Shaili Deveshwar/Naitik Panwala, PA.   The office is located at [address], [address], [place], [place] [address] No appointment is necessary.   Labs are drawn by [facility].  You may receive a bill from [facility] for your lab work.     

## 2016-11-22 NOTE — Progress Notes (Signed)
Office Visit Note  Patient: Jordan [name]             Date of Birth: May 12, 1934           MRN: [id number]             PCP: Zachery Dauer, FNP Referring: Zachery Dauer, FNP Visit Date: 11/22/2016 Occupation: @GUAROCC @    Subjective:  Pain and swelling in hands and knees   History of Present Illness: Jordan [name] is a 83 y.o. female with history of rheumatoid arthritis. She states she's been having increased pain and discomfort in her shoulders, hands and knee joints. She also complains of swelling in her hands and knee joints. Lower back pain is tolerable. She states she's been taking Enbrel and methotrexate on a weekly basis. She has not missed any doses. She continues to have some discomfort in her bilateral total knee replacement.   Activities of Daily Living:  Patient reports morning stiffness for 4 hours.   Patient Reports nocturnal pain.  Difficulty dressing/grooming: Reports Difficulty climbing stairs: Reports Difficulty getting out of chair: Reports Difficulty using hands for taps, buttons, cutlery, and/or writing: Reports   Review of Systems  Constitutional: Positive for fatigue. Negative for night sweats, weight gain, weight loss and weakness.  HENT: Positive for mouth dryness. Negative for mouth sores, trouble swallowing, trouble swallowing and nose dryness.   Eyes: Negative for pain, redness, visual disturbance and dryness.  Respiratory: Negative for cough, shortness of breath and difficulty breathing.   Cardiovascular: Positive for palpitations. Negative for chest pain, hypertension, irregular heartbeat and swelling in legs/feet.  Gastrointestinal: Positive for diarrhea. Negative for blood in stool and constipation.  Endocrine: Negative for increased urination.  Genitourinary: Negative for vaginal dryness.  Musculoskeletal: Positive for arthralgias, joint pain, joint swelling and morning stiffness. Negative for myalgias, muscle weakness, muscle tenderness and  myalgias.  Skin: Negative for color change, rash, hair loss, skin tightness, ulcers and sensitivity to sunlight.  Allergic/Immunologic: Negative for susceptible to infections.  Neurological: Negative for dizziness, memory loss and night sweats.  Hematological: Negative for swollen glands.  Psychiatric/Behavioral: Positive for depressed mood and sleep disturbance. The patient is nervous/anxious.     PMFS History:  Patient Active Problem List   Diagnosis Date Noted  . Rheumatoid arthritis involving multiple sites with positive rheumatoid factor (HCC) 08/17/2016  . High risk medication use 08/17/2016  . H/O total knee replacement, bilateral 08/17/2016  . Spondylosis of lumbar region without myelopathy or radiculopathy 08/17/2016  . History of glaucoma/ patient is legally blind 08/17/2016  . History of gastroesophageal reflux (GERD) 08/17/2016  . History of diverticulosis 08/17/2016  . History of hypothyroidism 08/17/2016  . history of asthmatic bronchitis 08/17/2016  . History of memory loss 08/17/2016  . History of atherosclerosis 08/17/2016    Past Medical History:  Diagnosis Date  . GERD (gastroesophageal reflux disease)   . High cholesterol   . Hypertension   . Stroke [facility])     History reviewed. No pertinent family history. Past Surgical History:  Procedure Laterality Date  . ABDOMINAL HYSTERECTOMY    . REPLACEMENT TOTAL KNEE BILATERAL Bilateral   . WRIST SURGERY Left    Social History   Social History Narrative  . No narrative on file     Objective: Vital Signs: BP (!) 90/45 (BP Location: Left Arm, Patient Position: Sitting, Cuff Size: Normal)   Pulse (!) 50   Resp 15   Ht 5' (1.524 m)   Wt 111 lb (50.3  kg)   BMI 21.68 kg/m    Physical Exam  Constitutional: She is oriented to person, place, and time. She appears well-developed and well-nourished.  HENT:  Head: Normocephalic and atraumatic.  Eyes: Conjunctivae are normal.  She is blind due to glaucoma    Neck: Normal range of motion.  Cardiovascular: Normal rate, regular rhythm, normal heart sounds and intact distal pulses.   Pulmonary/Chest: Effort normal and breath sounds normal.  Abdominal: Soft. Bowel sounds are normal.  Lymphadenopathy:    She has no cervical adenopathy.  Neurological: She is alert and oriented to person, place, and time.  Skin: Skin is warm and dry. Capillary refill takes less than 2 seconds.  Psychiatric: She has a normal mood and affect. Her behavior is normal.  Nursing note and vitals reviewed.    Musculoskeletal Exam: C-spine and thoracic lumbar spine limitation of range of motion. She had good range of motion of her shoulder joints with discomfort. Elbow joints wrist joints are good range of motion. She is some synovial thickening over bilateral second MCP joints. She has DIP PIP thickening. No synovitis was noted. Hip joints knee joints are good range of motion in her bilateral total knee replacement is doing well. Ankle joints MTPs PIPs with good range of motion with no synovitis. She has flexor tendon thickening of her left third and fourth fingers.  CDAI Exam: No CDAI exam completed.    Investigation: Findings:  10/05/2016 CBC normal, CMP 1.20 and GFR 49. Her last TB gold was negative on 12/18/2015.    Imaging: No results found.  Speciality Comments: No specialty comments available.    Procedures:  No procedures performed Allergies: Penicillins   Assessment / Plan:     Visit Diagnoses: Rheumatoid arthritis involving multiple sites with positive rheumatoid factor (HCC): She is doing very well as regards to rheumatoid arthritis she has some synovial thickening but no synovitis on examination today. She is some underlying osteoarthritis. She continues to have some chronic pain.  High risk medication use - Enbrel 50 mg sq  qweek, MTX 4tabs po qwk, Folic acid 1mg  po qd - Plan: Quantiferon tb gold assay (blood). Her labs were within normal limits. We  will check her labs again in June and then every 3 months to monitor for drug toxicity.  Trigger middle finger and ring finger of left hand: We'll schedule ultrasound-guided injection in June.  Spondylosis of lumbar region without myelopathy or radiculopathy: Chronic pain  H/O total knee replacement, bilateral: Doing fairly well  Other medical problems are as follows  History of glaucoma/ patient is legally blind  History of gastroesophageal reflux (GERD)  History of diverticulosis  History of hypothyroidism  history of asthmatic bronchitis  History of memory loss  History of atherosclerosis    Orders: Orders Placed This Encounter  Procedures  . Quantiferon tb gold assay (blood)   Meds ordered this encounter  Medications  . folic acid (FOLVITE) 1 MG tablet    Sig: Take 1 tablet (1 mg total) by mouth daily.    Dispense:  90 tablet    Refill:  3    Face-to-face time spent with patient was 30 minutes. 50% of time was spent in counseling and coordination of care.  Follow-Up Instructions: Return in about 6 months (around 05/25/2017) for Rheumatoid arthritis.   13/07/2016, MD  Note - This record has been created using Pollyann Savoy.  Chart creation errors have been sought, but may not always  have been located. Such creation  errors do not reflect on  the standard of medical care.

## 2016-11-23 ENCOUNTER — Ambulatory Visit: Payer: Medicare Other | Admitting: Rheumatology

## 2016-11-24 ENCOUNTER — Other Ambulatory Visit: Payer: Self-pay | Admitting: Rheumatology

## 2016-11-24 NOTE — Telephone Encounter (Signed)
ok 

## 2016-11-24 NOTE — Telephone Encounter (Signed)
Last Visit: 11/22/16 Next Visit: 01/04/17 Labs: 10/05/16 Stable TB Gold: 12/18/15 Neg   Okay to refill Enbrel?

## 2016-11-30 DIAGNOSIS — E039 Hypothyroidism, unspecified: Secondary | ICD-10-CM | POA: Diagnosis not present

## 2016-11-30 DIAGNOSIS — Z0000 Encounter for general adult medical examination without abnormal findings: Secondary | ICD-10-CM | POA: Diagnosis not present

## 2016-11-30 DIAGNOSIS — E782 Mixed hyperlipidemia: Secondary | ICD-10-CM | POA: Diagnosis not present

## 2016-11-30 DIAGNOSIS — I10 Essential (primary) hypertension: Secondary | ICD-10-CM | POA: Diagnosis not present

## 2016-11-30 DIAGNOSIS — M069 Rheumatoid arthritis, unspecified: Secondary | ICD-10-CM | POA: Diagnosis not present

## 2016-12-14 ENCOUNTER — Ambulatory Visit: Payer: Medicare Other | Admitting: Rheumatology

## 2016-12-16 ENCOUNTER — Ambulatory Visit: Payer: Medicare Other | Admitting: Rheumatology

## 2016-12-30 NOTE — Progress Notes (Deleted)
Assessment / Plan:     Visit Diagnoses: Rheumatoid arthritis involving multiple sites with positive rheumatoid factor (HCC): She is doing very well as regards to rheumatoid arthritis she has some synovial thickening but no synovitis on examination today. She is some underlying osteoarthritis. She continues to have some chronic pain.  High risk medication use - Enbrel 50 mg sq  qweek, MTX 4tabs po qwk, Folic acid 1mg  po qd - Plan: Quantiferon tb gold assay (blood). Her labs were within normal limits. We will check her labs again in June and then every 3 months to monitor for drug toxicity.  Trigger middle finger and ring finger of left hand: We'll schedule ultrasound-guided injection in June.  Spondylosis of lumbar region without myelopathy or radiculopathy: Chronic pain  H/O total knee replacement, bilateral: Doing fairly well  Other medical problems are as follows  History of glaucoma/ patient is legally blind  History of gastroesophageal reflux (GERD)  History of diverticulosis  History of hypothyroidism  history of asthmatic bronchitis  History of memory loss  History of atherosclerosis

## 2017-01-02 DIAGNOSIS — H44512 Absolute glaucoma, left eye: Secondary | ICD-10-CM | POA: Diagnosis not present

## 2017-01-02 DIAGNOSIS — H401113 Primary open-angle glaucoma, right eye, severe stage: Secondary | ICD-10-CM | POA: Diagnosis not present

## 2017-01-04 ENCOUNTER — Ambulatory Visit: Payer: Medicare Other | Admitting: Rheumatology

## 2017-01-05 NOTE — Progress Notes (Deleted)
Office Visit Note  Patient: Jordan [name]             Date of Birth: [birth year]           MRN: [id number]             PCP: Zachery Dauer, FNP Referring: Zachery Dauer, FNP Visit Date: 01/09/2017 Occupation: @GUAROCC @    Subjective:  No chief complaint on file.   History of Present Illness: Jordan [name] is a 83 y.o. female ***   Activities of Daily Living:  Patient reports morning stiffness for *** {minute/hour:19697}.   Patient {ACTIONS;DENIES/REPORTS:21021675::"Denies"} nocturnal pain.  Difficulty dressing/grooming: {ACTIONS;DENIES/REPORTS:21021675::"Denies"} Difficulty climbing stairs: {ACTIONS;DENIES/REPORTS:21021675::"Denies"} Difficulty getting out of chair: {ACTIONS;DENIES/REPORTS:21021675::"Denies"} Difficulty using hands for taps, buttons, cutlery, and/or writing: {ACTIONS;DENIES/REPORTS:21021675::"Denies"}   No Rheumatology ROS completed.   PMFS History:  Patient Active Problem List   Diagnosis Date Noted  . Rheumatoid arthritis involving multiple sites with positive rheumatoid factor (HCC) 08/17/2016  . High risk medication use 08/17/2016  . H/O total knee replacement, bilateral 08/17/2016  . Spondylosis of lumbar region without myelopathy or radiculopathy 08/17/2016  . History of glaucoma/ patient is legally blind 08/17/2016  . History of gastroesophageal reflux (GERD) 08/17/2016  . History of diverticulosis 08/17/2016  . History of hypothyroidism 08/17/2016  . history of asthmatic bronchitis 08/17/2016  . History of memory loss 08/17/2016  . History of atherosclerosis 08/17/2016    Past Medical History:  Diagnosis Date  . GERD (gastroesophageal reflux disease)   . High cholesterol   . Hypertension   . Stroke [facility])     No family history on file. Past Surgical History:  Procedure Laterality Date  . ABDOMINAL HYSTERECTOMY    . REPLACEMENT TOTAL KNEE BILATERAL Bilateral   . WRIST SURGERY Left    Social History   Social History Narrative  .  No narrative on file     Objective: Vital Signs: There were no vitals taken for this visit.   Physical Exam   Musculoskeletal Exam: ***  CDAI Exam: No CDAI exam completed.    Investigation: Findings:  TB gold negative 12/2015   CBC Latest Ref Rng & Units 10/05/2016 03/31/2015  WBC 3.8 - 10.8 K/uL 5.9 8.5  Hemoglobin 11.7 - 15.5 g/dL 05/31/2015 26.8)  Hematocrit 35.0 - 45.0 % 37.2 30.0(L)  Platelets 140 - 400 K/uL 194 406(H)   CMP Latest Ref Rng & Units 10/05/2016 03/31/2015  Glucose 65 - 99 mg/dL 90 78  BUN 7 - 25 mg/dL 17 16  Creatinine 05/31/2015 - 0.88 mg/dL 9.62) 2.29(N)  Sodium 135 - 146 mmol/L 139 136  Potassium 3.5 - 5.3 mmol/L 4.9 4.7  Chloride 98 - 110 mmol/L 102 102  CO2 20 - 31 mmol/L 31 26  Calcium 8.6 - 10.4 mg/dL 9.89(Q 9.4  Total Protein 6.1 - 8.1 g/dL 7.3 -  Total Bilirubin 0.2 - 1.2 mg/dL 0.6 -  Alkaline Phos 33 - 130 U/L 48 -  AST 10 - 35 U/L 15 -  ALT 6 - 29 U/L 7 -     Imaging: No results found.  Speciality Comments: No specialty comments available.    Procedures:  No procedures performed Allergies: Penicillins   Assessment / Plan:     Visit Diagnoses: Rheumatoid arthritis involving multiple sites with positive rheumatoid factor (HCC)  High risk medication use  H/O total knee replacement, bilateral  Spondylosis of lumbar region without myelopathy or radiculopathy  History of glaucoma/ patient is legally blind  History of gastroesophageal reflux (  GERD)  History of diverticulosis  History of hypothyroidism  history of asthmatic bronchitis  History of memory loss  History of atherosclerosis    Orders: No orders of the defined types were placed in this encounter.  No orders of the defined types were placed in this encounter.   Face-to-face time spent with patient was *** minutes. 50% of time was spent in counseling and coordination of care.  Follow-Up Instructions: No Follow-up on file.   Glenna Brunkow, RT  Note - This record has  been created using [employer].  Chart creation errors have been sought, but may not always  have been located. Such creation errors do not reflect on  the standard of medical care.

## 2017-01-05 NOTE — Progress Notes (Signed)
Visit Diagnoses: Rheumatoid arthritis involving multiple sites with positive rheumatoid factor (HCC): She is doing very well as regards to rheumatoid arthritis she has some synovial thickening but no synovitis on examination today. She is some underlying osteoarthritis. She continues to have some chronic pain.  High risk medication use - Enbrel 50 mg sq  qweek, MTX 4tabs po qwk, Folic acid 1mg  po qd - Plan: Quantiferon tb gold assay (blood). Her labs were within normal limits. We will check her labs again in June and then every 3 months to monitor for drug toxicity.  Trigger middle finger and ring finger of left hand: We'll schedule ultrasound-guided injection in June.

## 2017-01-09 ENCOUNTER — Ambulatory Visit (INDEPENDENT_AMBULATORY_CARE_PROVIDER_SITE_OTHER): Payer: Medicare Other | Admitting: Rheumatology

## 2017-01-09 DIAGNOSIS — Z79899 Other long term (current) drug therapy: Secondary | ICD-10-CM

## 2017-01-09 DIAGNOSIS — M65332 Trigger finger, left middle finger: Secondary | ICD-10-CM

## 2017-01-09 DIAGNOSIS — M65342 Trigger finger, left ring finger: Secondary | ICD-10-CM

## 2017-01-09 LAB — CBC WITH DIFFERENTIAL/PLATELET
Basophils Absolute: 0 cells/uL (ref 0–200)
Basophils Relative: 0 %
Eosinophils Absolute: 0 cells/uL — ABNORMAL LOW (ref 15–500)
Eosinophils Relative: 0 %
HCT: 35 % (ref 35.0–45.0)
Hemoglobin: 11.2 g/dL — ABNORMAL LOW (ref 11.7–15.5)
Lymphocytes Relative: 30 %
Lymphs Abs: 2010 cells/uL (ref 850–3900)
MCH: 24 pg — ABNORMAL LOW (ref 27.0–33.0)
MCHC: 32 g/dL (ref 32.0–36.0)
MCV: 75.1 fL — ABNORMAL LOW (ref 80.0–100.0)
MPV: 10.9 fL (ref 7.5–12.5)
Monocytes Absolute: 268 cells/uL (ref 200–950)
Monocytes Relative: 4 %
Neutro Abs: 4422 cells/uL (ref 1500–7800)
Neutrophils Relative %: 66 %
Platelets: 248 10*3/uL (ref 140–400)
RBC: 4.66 MIL/uL (ref 3.80–5.10)
RDW: 17.1 % — ABNORMAL HIGH (ref 11.0–15.0)
WBC: 6.7 10*3/uL (ref 3.8–10.8)

## 2017-01-09 MED ORDER — TRIAMCINOLONE ACETONIDE 40 MG/ML IJ SUSP
10.0000 mg | INTRAMUSCULAR | Status: AC | PRN
  Administered 2017-01-09: 10 mg

## 2017-01-09 MED ORDER — LIDOCAINE HCL 1 % IJ SOLN
0.5000 mL | INTRAMUSCULAR | Status: AC | PRN
  Administered 2017-01-09: .5 mL

## 2017-01-09 NOTE — Progress Notes (Signed)
   Procedure Note  Patient: Jordan [name]             Date of Birth: 1933-09-22           MRN: [id number]             Visit Date: 01/09/2017  Procedures: Visit Diagnoses: High risk medication use - Plan: CBC with Differential/Platelet, COMPLETE METABOLIC PANEL WITH GFR, Quantiferon tb gold assay (blood)  Hand/UE Inj Date/Time: 01/09/2017 2:22 PM Performed by: Pollyann Savoy Authorized by: Pollyann Savoy   Consent Given by:  Patient Site marked: the procedure site was marked   Timeout: prior to procedure the correct patient, procedure, and site was verified   Indications:  Therapeutic and tendon swelling Condition: trigger finger   Location:  Long finger Site:  L long A1 Prep: patient was prepped and draped in usual sterile fashion   Needle Size:  27 G Approach:  Volar Ultrasound Guidance: Yes   Medications:  0.5 mL lidocaine 1 %; 10 mg triamcinolone acetonide 40 MG/ML Aspirate amount (mL):  0 Patient tolerance:  Patient tolerated the procedure well with no immediate complications Hand/UE Inj Date/Time: 01/09/2017 2:23 PM Performed by: Pollyann Savoy Authorized by: Pollyann Savoy   Consent Given by:  Patient Site marked: the procedure site was marked   Timeout: prior to procedure the correct patient, procedure, and site was verified   Indications:  Therapeutic and tendon swelling Condition: trigger finger   Location:  Ring finger Site:  L ring A1 Prep: patient was prepped and draped in usual sterile fashion   Needle Size:  27 G Approach:  Volar Ultrasound Guidance: Yes   Medications:  0.5 mL lidocaine 1 %; 10 mg triamcinolone acetonide 40 MG/ML Aspirate amount (mL):  0 Patient tolerance:  Patient tolerated the procedure well with no immediate complications   Pollyann Savoy, MD

## 2017-01-10 LAB — COMPLETE METABOLIC PANEL WITH GFR
ALT: 9 U/L (ref 6–29)
AST: 14 U/L (ref 10–35)
Albumin: 4 g/dL (ref 3.6–5.1)
Alkaline Phosphatase: 65 U/L (ref 33–130)
BUN: 14 mg/dL (ref 7–25)
CO2: 26 mmol/L (ref 20–31)
Calcium: 9.4 mg/dL (ref 8.6–10.4)
Chloride: 102 mmol/L (ref 98–110)
Creat: 1.01 mg/dL — ABNORMAL HIGH (ref 0.60–0.88)
GFR, Est African American: 60 mL/min (ref >=60)
GFR, Est Non African American: 52 mL/min — ABNORMAL LOW (ref >=60)
Glucose, Bld: 107 mg/dL — ABNORMAL HIGH (ref 65–99)
Potassium: 4.8 mmol/L (ref 3.5–5.3)
Sodium: 139 mmol/L (ref 135–146)
Total Bilirubin: 0.6 mg/dL (ref 0.2–1.2)
Total Protein: 6.6 g/dL (ref 6.1–8.1)

## 2017-01-11 LAB — QUANTIFERON TB GOLD ASSAY (BLOOD)
Interferon Gamma Release Assay: NEGATIVE
Mitogen-Nil: 4.37 IU/mL
Quantiferon Nil Value: 0.03 IU/mL
Quantiferon Tb Ag Minus Nil Value: 0.02 IU/mL

## 2017-01-11 NOTE — Progress Notes (Signed)
WNL

## 2017-01-17 ENCOUNTER — Telehealth: Payer: Self-pay | Admitting: Radiology

## 2017-01-17 NOTE — Telephone Encounter (Signed)
I have called patient to advise labs are normal  

## 2017-01-17 NOTE — Telephone Encounter (Signed)
-----   Message from Pollyann Savoy, MD sent at 01/11/2017  2:57 PM EDT ----- WNL

## 2017-01-27 ENCOUNTER — Other Ambulatory Visit: Payer: Self-pay | Admitting: Rheumatology

## 2017-01-27 NOTE — Telephone Encounter (Signed)
Last Visit: 01/09/17 Next Visit: 05/25/17 Labs: 01/09/17 WNL  Okay to refill per Dr. Corliss Skains

## 2017-02-01 ENCOUNTER — Telehealth: Payer: Self-pay | Admitting: Rheumatology

## 2017-02-01 NOTE — Telephone Encounter (Signed)
Patient's daughter called in regards to her mother's pain medicine.  CB#[phone].  Thank you.

## 2017-02-01 NOTE — Telephone Encounter (Signed)
Patient's daughter [name]) called wanting to know if Dr. Corliss Skains will call something in for her mother, she is in a lot of pain.  She uses [facility] .  CB#[phone].

## 2017-02-02 ENCOUNTER — Telehealth: Payer: Self-pay | Admitting: Radiology

## 2017-02-02 MED ORDER — PREDNISONE 5 MG PO TABS: ORAL_TABLET | ORAL | 0 refills | Status: DC

## 2017-02-02 NOTE — Telephone Encounter (Signed)
Patient has called to state she has been off Methotrexate  for 2 weeks, and has given me verbal consent to speak to her daughter [name]. I have told her to sign HIPPA form when she comes in for her next visit, she agrees.    Since she has been off MTX she has had a flare, and would like something sent to [facility], please advise

## 2017-02-02 NOTE — Telephone Encounter (Signed)
Discussed with Dr Corliss Skains / ok for pred taper  Will call patient

## 2017-02-02 NOTE — Telephone Encounter (Signed)
We do not have a HIPAA consent on file, patient has been advised multiple times we do not prescribe narcotics at our office. I called daughter to advise can not discuss anything with her since I do not have a signed consent on file.

## 2017-02-03 NOTE — Telephone Encounter (Signed)
Left message to advise patient prednisone taper sent to the pharmacy

## 2017-02-10 ENCOUNTER — Other Ambulatory Visit: Payer: Self-pay | Admitting: Rheumatology

## 2017-02-16 ENCOUNTER — Other Ambulatory Visit: Payer: Self-pay | Admitting: Rheumatology

## 2017-02-16 DIAGNOSIS — G4700 Insomnia, unspecified: Secondary | ICD-10-CM | POA: Diagnosis not present

## 2017-02-16 DIAGNOSIS — Z1389 Encounter for screening for other disorder: Secondary | ICD-10-CM | POA: Diagnosis not present

## 2017-02-16 DIAGNOSIS — R52 Pain, unspecified: Secondary | ICD-10-CM | POA: Diagnosis not present

## 2017-02-16 NOTE — Telephone Encounter (Signed)
Last Visit: 01/09/17 Next Visit: 05/25/17 Labs: 01/09/17 WNL TB Gold: 01/09/17 Neg   Okay to refill per Dr. Corliss Skains

## 2017-03-09 DIAGNOSIS — I10 Essential (primary) hypertension: Secondary | ICD-10-CM | POA: Diagnosis not present

## 2017-03-09 DIAGNOSIS — E039 Hypothyroidism, unspecified: Secondary | ICD-10-CM | POA: Diagnosis not present

## 2017-03-09 DIAGNOSIS — G4700 Insomnia, unspecified: Secondary | ICD-10-CM | POA: Diagnosis not present

## 2017-04-12 ENCOUNTER — Encounter: Payer: Self-pay | Admitting: Neurology

## 2017-04-12 ENCOUNTER — Ambulatory Visit (INDEPENDENT_AMBULATORY_CARE_PROVIDER_SITE_OTHER): Payer: Medicare Other | Admitting: Neurology

## 2017-04-12 VITALS — BP 240/102 | HR 60 | Ht 60.0 in | Wt 119.5 lb

## 2017-04-12 DIAGNOSIS — F329 Major depressive disorder, single episode, unspecified: Secondary | ICD-10-CM | POA: Diagnosis not present

## 2017-04-12 DIAGNOSIS — R413 Other amnesia: Secondary | ICD-10-CM

## 2017-04-12 DIAGNOSIS — F32A Depression, unspecified: Secondary | ICD-10-CM

## 2017-04-12 MED ORDER — LISINOPRIL 20 MG PO TABS: 20.0000 mg | ORAL_TABLET | Freq: Two times a day (BID) | ORAL | 11 refills | Status: DC

## 2017-04-12 MED ORDER — QUETIAPINE FUMARATE 25 MG PO TABS: 50.0000 mg | ORAL_TABLET | Freq: Every day | ORAL | 11 refills | Status: DC

## 2017-04-12 NOTE — Progress Notes (Signed)
PATIENT: Jordan [name] DOB: 06/09/1934  Chief Complaint  Patient presents with  . Depression/Hallucinations    She is here with her neice, Mariann Laster.  She has been experiencing severe depression along with auditory and visual hallucinations.  She has not been evaluated by psychiatry yet.     HISTORICAL  Jordan [name] is 83 year old female, seen in refer by her primary care nurse practitioner [name] for evaluation of depression hallucinations, she is accompanied by her niece Mariann Laster at visit, initial evaluation was on April 12 2017.  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  She complains of depression, sad about her losing her independency, she has irregular sleeping pattern, has difficulty falling to sleep, despite taking Restoril 14m every night,  she was also noted to have mild memory loss.   Laboratory evaluations in 2018 showed normal CMP, CBC, negative Quantiferon TB antibody, normal ESR 31,  REVIEW OF SYSTEMS: Full 14 system review of systems performed and notable only for as above   ALLERGIES: Allergies  Allergen Reactions  . Penicillins Hives    HOME MEDICATIONS: Current Outpatient Prescriptions  Medication Sig Dispense Refill  . aspirin 81 MG tablet Take 81 mg by mouth  daily.    . brimonidine-timolol (COMBIGAN) 0.2-0.5 % ophthalmic solution Place 1 drop into both eyes every 12 (twelve) hours.    . calcium carbonate (CALCIUM 600) 600 MG TABS tablet Take by mouth.    . Cholecalciferol (VITAMIN D PO) Take 1 tablet by mouth daily.    . ENBREL SURECLICK 50 MG/ML injection USE 1 DEVICE TO INJECT UNDER THE SKIN ONCE A WEEK 116.10mL 0  . folic acid (FOLVITE) 1 MG tablet Take 1 tablet (1 mg total) by mouth daily. 90 tablet 3  . fosinopril (MONOPRIL) 40 MG tablet Take 40 mg by mouth daily.    .Marland Kitchengabapentin (NEURONTIN) 100 MG capsule Take 100 mg by mouth.    .Marland Kitchenibuprofen (ADVIL,MOTRIN) 200 MG tablet Take 800 mg by mouth every 6 (six) hours as needed (pain).    .Marland Kitchenipratropium (ATROVENT) 0.06 % nasal spray Place into the nose.    . latanoprost (XALATAN) 0.005 % ophthalmic solution Place 1 drop into both eyes nightly.    . levothyroxine (SYNTHROID, LEVOTHROID) 75 MCG tablet Take 75 mcg by mouth daily before breakfast.    . memantine (NAMENDA) 10 MG tablet Take 10 mg by mouth 2 (two) times daily.    . methotrexate (RHEUMATREX) 2.5 MG tablet TAKE 4 TABLETS BY MOUTH ONCE A WEEK, CAUTION:CHEMOTHERAPY, PROTECT FROM LIGHT 48 tablet 0  . Multiple Vitamins-Minerals (MULTIVITAL PO) Take 1 tablet by mouth daily.    .Marland Kitchenomeprazole (PRILOSEC) 20 MG capsule Take 20 mg by mouth daily.    . predniSONE (DELTASONE) 5 MG tablet 4 tab q am  x4days, 3 tab q am x4d, 2 tab q amx4 days, 1 tab q am x4d ,1/2 tab qam x4d then  d/c 42 tablet 0  . simvastatin (ZOCOR) 40 MG tablet Take 40 mg by mouth daily.     No current facility-administered medications for this visit.     PAST MEDICAL HISTORY: Past Medical History:  Diagnosis Date  . Depression   . GERD (gastroesophageal reflux disease)   . High cholesterol   . Hypertension   . Stroke [facility])     PAST SURGICAL HISTORY: Past Surgical History:  Procedure Laterality Date  . ABDOMINAL HYSTERECTOMY    . REPLACEMENT TOTAL KNEE BILATERAL Bilateral     . WRIST SURGERY Left     FAMILY HISTORY: History reviewed. No pertinent family history.  SOCIAL HISTORY:  Social History   Social History  . Marital status: Single    Spouse name: N/A  . Number of children: 2  . Years of education: 8th   Occupational History  . Retired    Social History Main Topics  . Smoking status: Former Smoker    Packs/day: 0.25    Years: 13.00    Types: Cigarettes    Quit date: 08/18/2006  . Smokeless tobacco: Never Used  . Alcohol use No  . Drug use: No  . Sexual activity: Not on file   Other Topics Concern  . Not on file   Social History Narrative   Lives at home with grandson.   Right-handed.   2-3 cups coffee per day.     PHYSICAL EXAM   Vitals:   04/12/17 1000  BP: (!) 240/102  Pulse: 60  Weight: 119 lb 8 oz (54.2 kg)  Height: 5' (1.524 m)    Not recorded      Body mass index is 23.34 kg/m.  PHYSICAL EXAMNIATION:  Gen: NAD, conversant, well nourised, obese, well groomed                     Cardiovascular: Regular rate rhythm, no peripheral edema, warm, nontender. Eyes: Conjunctivae clear without exudates or hemorrhage Neck: Supple, no carotid bruits. Pulmonary: Clear to auscultation bilaterally   NEUROLOGICAL EXAM:  MENTAL STATUS: Speech:    Speech is normal; fluent and spontaneous with normal comprehension.  Cognition:     Orientation to time, place and person     Missed 3/3 recalls.     Normal Attention span and concentration     Normal Language, naming, repeating,spontaneous speech     Fund of knowledge   CRANIAL NERVES: CN II:  . Pupils are irregular CN III, IV, VI: extraocular movement are normal. No ptosis. CN V: Facial sensation is intact to pinprick in all 3 divisions bilaterally. Corneal responses are intact.  CN VII: Face is symmetric with normal eye closure and smile. CN VIII: Hearing is normal to rubbing fingers CN IX, X: Palate elevates symmetrically. Phonation is normal. CN XI: Head turning  and shoulder shrug are intact CN XII: Tongue is midline with normal movements and no atrophy.  MOTOR: There is no pronator drift of out-stretched arms. Muscle bulk and tone are normal. Muscle strength is normal.  REFLEXES: Reflexes are 2+ and symmetric at the biceps, triceps, knees, and ankles. Plantar responses are flexor.  SENSORY: Intact to light touch, pinprick, positional sensation and vibratory sensation are intact in fingers and toes.  COORDINATION: Rapid alternating movements and fine finger movements are intact. There is no dysmetria on finger-to-nose and heel-knee-shin.  GAIT/STANCE: Need to push on chair to getup, cautious gait.   DIAGNOSTIC DATA (LABS, IMAGING, TESTING) - I reviewed patient records, labs, notes, testing and imaging myself where available.   ASSESSMENT AND PLAN  Jordan [name] is a 83 y.o. female   Mild cognitive impairment  MRI of the brain  Laboratory evaluations  Depression insomnia  Starting seroquel 25 mg titrating to 50 mg every night  Elevated blood pressure  Add on Lisinopril 20 mg twice a day, but continue follow-up with primary care  Marcial Pacas, M.D. Ph.D.  [facility] [address] [place], [place] [address] Ph: [phone] Fax: [phone]  CC: [name]

## 2017-04-14 ENCOUNTER — Telehealth: Payer: Self-pay | Admitting: Neurology

## 2017-04-14 LAB — VITAMIN B12: Vitamin B-12: 622 pg/mL (ref 232–1245)

## 2017-04-14 LAB — RPR, QUANT+TP ABS (REFLEX)
Rapid Plasma Reagin, Quant: 1:1 {titer} — ABNORMAL HIGH
T Pallidum Abs: POSITIVE — AB

## 2017-04-14 LAB — FOLATE: Folate: >20 ng/mL (ref >3.0)

## 2017-04-14 LAB — RPR: RPR Ser Ql: REACTIVE — AB

## 2017-04-14 NOTE — Telephone Encounter (Signed)
Spoke to her daughter - aware of results and verbalized understanding.

## 2017-04-14 NOTE — Telephone Encounter (Signed)
Laboratory evaluations showed low titer positive RPR,  Will have repeat laboratory evaluations at her next follow-up visit, rest of the laboratory evaluations were within normal limit.

## 2017-04-14 NOTE — Telephone Encounter (Signed)
Left message for her daughter, [name] (on HIPAA) to return our call.

## 2017-04-29 ENCOUNTER — Other Ambulatory Visit: Payer: Self-pay | Admitting: Rheumatology

## 2017-05-01 NOTE — Telephone Encounter (Signed)
Patient's daughter returned call and states she will update labs this week.

## 2017-05-01 NOTE — Telephone Encounter (Signed)
Last Visit: 01/09/17 Next Visit: 05/25/17 Labs: 01/09/17 WNL TB Gold: 01/09/17 Neg   Left message to advise patient she is due for labs and to call with plan.   Okay to refill  30 day supply per Dr. Corliss Skains

## 2017-05-04 ENCOUNTER — Other Ambulatory Visit: Payer: Medicare Other

## 2017-05-14 NOTE — Progress Notes (Deleted)
Office Visit Note  Patient: Jordan [name]             Date of Birth: 1934/01/11           MRN: [id number]             PCP: Arnette Felts Referring: Zachery Dauer, FNP Visit Date: 05/25/2017 Occupation: @GUAROCC @    Subjective:  No chief complaint on file.   History of Present Illness: Jordan [name] is a 83 y.o. female ***   Activities of Daily Living:  Patient reports morning stiffness for *** {minute/hour:19697}.   Patient {ACTIONS;DENIES/REPORTS:21021675::"Denies"} nocturnal pain.  Difficulty dressing/grooming: {ACTIONS;DENIES/REPORTS:21021675::"Denies"} Difficulty climbing stairs: {ACTIONS;DENIES/REPORTS:21021675::"Denies"} Difficulty getting out of chair: {ACTIONS;DENIES/REPORTS:21021675::"Denies"} Difficulty using hands for taps, buttons, cutlery, and/or writing: {ACTIONS;DENIES/REPORTS:21021675::"Denies"}   No Rheumatology ROS completed.   PMFS History:  Patient Active Problem List   Diagnosis Date Noted  . Memory loss 04/12/2017  . Depression 04/12/2017  . Rheumatoid arthritis involving multiple sites with positive rheumatoid factor (HCC) 08/17/2016  . High risk medication use 08/17/2016  . H/O total knee replacement, bilateral 08/17/2016  . Spondylosis of lumbar region without myelopathy or radiculopathy 08/17/2016  . History of glaucoma/ patient is legally blind 08/17/2016  . History of gastroesophageal reflux (GERD) 08/17/2016  . History of diverticulosis 08/17/2016  . History of hypothyroidism 08/17/2016  . history of asthmatic bronchitis 08/17/2016  . History of memory loss 08/17/2016  . History of atherosclerosis 08/17/2016    Past Medical History:  Diagnosis Date  . Depression   . GERD (gastroesophageal reflux disease)   . High cholesterol   . Hypertension   . Stroke [facility])     No family history on file. Past Surgical History:  Procedure Laterality Date  . ABDOMINAL HYSTERECTOMY    . REPLACEMENT TOTAL KNEE BILATERAL Bilateral   . WRIST  SURGERY Left    Social History   Social History Narrative   Lives at home with grandson.   Right-handed.   2-3 cups coffee per day.     Objective: Vital Signs: There were no vitals taken for this visit.   Physical Exam   Musculoskeletal Exam: ***  CDAI Exam: No CDAI exam completed.    Investigation: No additional findings.TB Gold: Negative in 12/2016 CBC Latest Ref Rng & Units 01/09/2017 10/05/2016 03/31/2015  WBC 3.8 - 10.8 K/uL 6.7 5.9 8.5  Hemoglobin 11.7 - 15.5 g/dL 11.2(L) 12.1 9.8(L)  Hematocrit 35.0 - 45.0 % 35.0 37.2 30.0(L)  Platelets 140 - 400 K/uL 248 194 406(H)   CMP Latest Ref Rng & Units 01/09/2017 10/05/2016 03/31/2015  Glucose 65 - 99 mg/dL 05/31/2015) 90 78  BUN 7 - 25 mg/dL 14 17 16   Creatinine 0.60 - 0.88 mg/dL 809(X) ) 8.33(A)  Sodium 135 - 146 mmol/L 139 139 136  Potassium 3.5 - 5.3 mmol/L 4.8 4.9 4.7  Chloride 98 - 110 mmol/L 102 102 102  CO2 20 - 31 mmol/L 26 31 26   Calcium 8.6 - 10.4 mg/dL 9.4 2.50(N 9.4  Total Protein 6.1 - 8.1 g/dL 6.6 7.3 -  Total Bilirubin 0.2 - 1.2 mg/dL 0.6 0.6 -  Alkaline Phos 33 - 130 U/L 65 48 -  AST 10 - 35 U/L 14 15 -  ALT 6 - 29 U/L 9 7 -    Imaging: No results found.  Speciality Comments: No specialty comments available.    Procedures:  No procedures performed Allergies: Penicillins   Assessment / Plan:     Visit Diagnoses: No diagnosis found.  Orders: No orders of the defined types were placed in this encounter.  No orders of the defined types were placed in this encounter.   Face-to-face time spent with patient was *** minutes. 50% of time was spent in counseling and coordination of care.  Follow-Up Instructions: No Follow-up on file.   Earnestine Mealing, NT  Note - This record has been created using [employer].  Chart creation errors have been sought, but may not always  have been located. Such creation errors do not reflect on  the standard of medical care.

## 2017-05-18 ENCOUNTER — Ambulatory Visit
Admission: RE | Admit: 2017-05-18 | Discharge: 2017-05-18 | Disposition: A | Discharge status: Home / Self Care | Payer: Medicaid Other | Source: Ambulatory Visit | Attending: Neurology | Admitting: Neurology

## 2017-05-18 DIAGNOSIS — R413 Other amnesia: Secondary | ICD-10-CM

## 2017-05-22 ENCOUNTER — Telehealth: Payer: Self-pay | Admitting: *Deleted

## 2017-05-22 ENCOUNTER — Encounter: Payer: Self-pay | Admitting: Rheumatology

## 2017-05-22 ENCOUNTER — Ambulatory Visit (INDEPENDENT_AMBULATORY_CARE_PROVIDER_SITE_OTHER): Payer: Medicare Other | Admitting: Rheumatology

## 2017-05-22 VITALS — Wt 120.0 lb

## 2017-05-22 DIAGNOSIS — M0579 Rheumatoid arthritis with rheumatoid factor of multiple sites without organ or systems involvement: Secondary | ICD-10-CM

## 2017-05-22 DIAGNOSIS — Z96653 Presence of artificial knee joint, bilateral: Secondary | ICD-10-CM | POA: Diagnosis not present

## 2017-05-22 DIAGNOSIS — Z79899 Other long term (current) drug therapy: Secondary | ICD-10-CM

## 2017-05-22 DIAGNOSIS — Z780 Asymptomatic menopausal state: Secondary | ICD-10-CM | POA: Diagnosis not present

## 2017-05-22 DIAGNOSIS — M19041 Primary osteoarthritis, right hand: Secondary | ICD-10-CM

## 2017-05-22 DIAGNOSIS — M19042 Primary osteoarthritis, left hand: Secondary | ICD-10-CM

## 2017-05-22 NOTE — Telephone Encounter (Signed)
-----   Message from Levert Feinstein, MD sent at 05/19/2017  9:01 AM EDT ----- Please call pt for MRI of the brain showed mild age-related changes there was no acute abnormality.

## 2017-05-22 NOTE — Telephone Encounter (Signed)
Left patient a detailed message, with results, on voicemail.  Provided our number to call back with any questions.  

## 2017-05-22 NOTE — Progress Notes (Signed)
Office Visit Note  Patient: Jordan [name]             Date of Birth: [birth year]           MRN: [id number]             PCP: [name] Referring: Boyce Medici, FNP Visit Date: 05/22/2017 Occupation: @GUAROCC @    Subjective:  No chief complaint on file.   History of Present Illness: Jordan [name] is a 83 y.o. female   who states that she is doing well overall. She's been prescribed methotrexate 4 pills every week and folic acid 1 mg every day. Adequate response.  She continues to complain about bilateral knee joint discomfort. She has a history of bilateral knee replacement  She also complains of swelling to lower legs consistent with peripheral edema. She will discuss this with her PCP and seeif they can treat her. I've advised her that she may benefit from compression stockings.  Patient started seeing our office sometime in 2017. She used to live in [place]. Patient reports that she has not had a bone density done in years. She is definitely not had any since she moved to [place].  Activities of Daily Living:  Patient reports morning stiffness for 15 minutes.   Patient Reports nocturnal pain.  Difficulty dressing/grooming: Reports Difficulty climbing stairs: Reports Difficulty getting out of chair: Reports Difficulty using hands for taps, buttons, cutlery, and/or writing: Reports   Review of Systems  Constitutional: Negative for fatigue.  HENT: Negative for mouth sores and mouth dryness.   Eyes: Negative for dryness.  Respiratory: Negative for shortness of breath.   Gastrointestinal: Negative for constipation and diarrhea.  Musculoskeletal: Negative for myalgias and myalgias.  Skin: Negative for sensitivity to sunlight.  Psychiatric/Behavioral: Negative for decreased concentration and sleep disturbance.    PMFS History:  Patient Active Problem List   Diagnosis Date Noted  . Memory loss 04/12/2017  . Depression 04/12/2017  . Rheumatoid arthritis  involving multiple sites with positive rheumatoid factor ([place]) 08/17/2016  . High risk medication use 08/17/2016  . H/O total knee replacement, bilateral 08/17/2016  . Spondylosis of lumbar region without myelopathy or radiculopathy 08/17/2016  . History of glaucoma/ patient is legally blind 08/17/2016  . History of gastroesophageal reflux (GERD) 08/17/2016  . History of diverticulosis 08/17/2016  . History of hypothyroidism 08/17/2016  . history of asthmatic bronchitis 08/17/2016  . History of memory loss 08/17/2016  . History of atherosclerosis 08/17/2016    Past Medical History:  Diagnosis Date  . Depression   . GERD (gastroesophageal reflux disease)   . High cholesterol   . Hypertension   . Stroke [facility])     History reviewed. No pertinent family history. Past Surgical History:  Procedure Laterality Date  . ABDOMINAL HYSTERECTOMY    . REPLACEMENT TOTAL KNEE BILATERAL Bilateral   . WRIST SURGERY Left    Social History   Social History Narrative   Lives at home with grandson.   Right-handed.   2-3 cups coffee per day.     Objective: Vital Signs: Wt 120 lb (54.4 kg)   BMI 23.44 kg/m    Physical Exam  Constitutional: She is oriented to person, place, and time. She appears well-developed and well-nourished.  HENT:  Head: Normocephalic and atraumatic.  Eyes: Pupils are equal, round, and reactive to light. EOM are normal.  Cardiovascular: Normal rate, regular rhythm and normal heart sounds.  Exam reveals no gallop and no friction rub.   No  murmur heard. Pulmonary/Chest: Effort normal and breath sounds normal. She has no wheezes. She has no rales.  Abdominal: Soft. Bowel sounds are normal. She exhibits no distension. There is no tenderness. There is no guarding. No hernia.  Musculoskeletal: Normal range of motion. She exhibits no edema, tenderness or deformity.  Lymphadenopathy:    She has no cervical adenopathy.  Neurological: She is alert and oriented to person,  place, and time. Coordination normal.  Skin: Skin is warm and dry. Capillary refill takes less than 2 seconds. No rash noted.  Psychiatric: She has a normal mood and affect. Her behavior is normal.  Nursing note and vitals reviewed.    Musculoskeletal Exam:  Full range ofaterally For myalgia tender points are all absent  CDAI Exam: CDAI Homunculus Exam:   Tenderness:  Right hand: 3rd PIP Left hand: 1st MCP and 2nd MCP  Joint Counts:  CDAI Tender Joint count: 3 CDAI Swollen Joint count: 0  left first and second MCP with synovial thickening, right third PIP with synovial thickening. No synovitis on any joints. No ulnar deviation to any MCPs.   Investigation: No additional findings. Office Visit on 04/12/2017  Component Date Value Ref Range Status  . Vitamin B-12 04/12/2017 622  232 - 1,245 pg/mL Final  . RPR Ser Ql 04/12/2017 Reactive* Non Reactive Final  . Folate 04/12/2017 >20.0  >3.0 ng/mL Final   Comment: A serum folate concentration of less than 3.1 ng/mL is considered to represent clinical deficiency.   . Rapid Plasma Reagin, Quant 04/12/2017 1:1* NonRea<1:1 Final  . T Pallidum Abs 04/12/2017 Positive* Negative Final  Office Visit on 01/09/2017  Component Date Value Ref Range Status  . WBC 01/09/2017 6.7  3.8 - 10.8 K/uL Final  . RBC 01/09/2017 4.66  3.80 - 5.10 MIL/uL Final  . Hemoglobin 01/09/2017 11.2* 11.7 - 15.5 g/dL Final  . HCT 01/09/2017 35.0  35.0 - 45.0 % Final  . MCV 01/09/2017 75.1* 80.0 - 100.0 fL Final  . MCH 01/09/2017 24.0* 27.0 - 33.0 pg Final  . MCHC 01/09/2017 32.0  32.0 - 36.0 g/dL Final  . RDW 01/09/2017 17.1* 11.0 - 15.0 % Final  . Platelets 01/09/2017 248  140 - 400 K/uL Final  . MPV 01/09/2017 10.9  7.5 - 12.5 fL Final  . Neutro Abs 01/09/2017 4422  1,500 - 7,800 cells/uL Final  . Lymphs Abs 01/09/2017 2010  850 - 3,900 cells/uL Final  . Monocytes Absolute 01/09/2017 268  200 - 950 cells/uL Final  . Eosinophils Absolute 01/09/2017 0* 15  - 500 cells/uL Final  . Basophils Absolute 01/09/2017 0  0 - 200 cells/uL Final  . Neutrophils Relative % 01/09/2017 66  % Final  . Lymphocytes Relative 01/09/2017 30  % Final  . Monocytes Relative 01/09/2017 4  % Final  . Eosinophils Relative 01/09/2017 0  % Final  . Basophils Relative 01/09/2017 0  % Final  . Smear Review 01/09/2017 Criteria for review not met   Final  . Sodium 01/09/2017 139  135 - 146 mmol/L Final  . Potassium 01/09/2017 4.8  3.5 - 5.3 mmol/L Final  . Chloride 01/09/2017 102  98 - 110 mmol/L Final  . CO2 01/09/2017 26  20 - 31 mmol/L Final  . Glucose, Bld 01/09/2017 107* 65 - 99 mg/dL Final  . BUN 01/09/2017 14  7 - 25 mg/dL Final  . Creat 01/09/2017 1.01* 0.60 - 0.88 mg/dL Final   Comment:   For patients > or = 81 years  of age: The upper reference limit for Creatinine is approximately 13% higher for people identified as African-American.     . Total Bilirubin 01/09/2017 0.6  0.2 - 1.2 mg/dL Final  . Alkaline Phosphatase 01/09/2017 65  33 - 130 U/L Final  . AST 01/09/2017 14  10 - 35 U/L Final  . ALT 01/09/2017 9  6 - 29 U/L Final  . Total Protein 01/09/2017 6.6  6.1 - 8.1 g/dL Final  . Albumin 01/09/2017 4.0  3.6 - 5.1 g/dL Final  . Calcium 01/09/2017 9.4  8.6 - 10.4 mg/dL Final  . GFR, Est African American 01/09/2017 60  >=60 mL/min Final  . GFR, Est Non African American 01/09/2017 52* >=60 mL/min Final  . Interferon Gamma Release Assay 01/09/2017 NEGATIVE  NEGATIVE Final   Negative test result. M. tuberculosis complex infection unlikely.  . Quantiferon Nil Value 01/09/2017 0.03  IU/mL Final  . Mitogen-Nil 01/09/2017 4.37  IU/mL Final  . Quantiferon Tb Ag Minus Nil Value 01/09/2017 0.02  IU/mL Final   Comment:   The Nil tube value is used to determine if the patient has a preexisting immune response which could cause a false-positive reading on the test. In order for a test to be valid, the Nil tube must have a value of less than or equal to 8.0 IU/mL.     The mitogen control tube is used to assure the patient has a healthy immune status and also serves as a control for correct blood handling and incubation. It is used to detect false-negative readings. The mitogen tube must have a gamma interferon value of greater than or equal to 0.5 IU/mL higher than the value of the Nil tube.   The TB antigen tube is coated with the M. tuberculosis specific antigens. For a test to be considered positive, the TB antigen tube value minus the Nil tube value must be greater than or equal to 0.35 IU/mL.   For additional information, please refer to http://education.questdiagnostics.com/faq/QFT (This link is being provided for informational/educational purposes only.)      Imaging:      Mr Brain Wo Contrast  Result Date: 05/18/2017 [facility] NEUROIMAGING REPORT STUDY DATE: 05/18/17 PATIENT NAME: Olinda Nola DOB: Oct 22, 1933 MRN: [id number] ORDERING CLINICIAN: Marcial Pacas, MD PhD CLINICAL HISTORY: 83 year old female with memory loss. EXAM: MRI brain (without) TECHNIQUE: MRI of the brain without contrast was obtained utilizing 5 mm axial slices with T1, T2, T2 flair, SWI and diffusion weighted views.  T1 sagittal and T2 coronal views were obtained. CONTRAST: no COMPARISON: none IMAGING SITE: [facility] [address] (1.5 Tesla MRI)  FINDINGS: No abnormal lesions are seen on diffusion-weighted views to suggest acute ischemia. The cortical sulci, fissures and cisterns are normal in size and appearance. Lateral, third and fourth ventricle are normal in size and appearance. No extra-axial fluid collections are seen. No evidence of mass effect or midline shift. Mild scattered periventricular and subcortical chronic small vessel ischemic disease. On sagittal views the posterior fossa, pituitary gland and corpus callosum are unremarkable. No evidence of intracranial hemorrhage on SWI views. The orbits and their contents, paranasal  sinuses and calvarium are notable for bilateral lens extractions.  Intracranial flow voids are present.   MRI brain (without) demonstrating: 1. Mild scattered periventricular and subcortical non-specific gliosis, likely chronic small vessel ischemic disease. 2. No acute findings. INTERPRETING PHYSICIAN: Penni Bombard, MD Certified in Neurology, Neurophysiology and Neuroimaging [facility] [address]  [place], [place] [address] [phone]    Speciality Comments: No specialty comments available.    Procedures:  No procedures performed Allergies: Penicillins   Assessment / Plan:     Visit Diagnoses: Post-menopausal - Plan: DG Bone Density, VITAMIN D 25 Hydroxy (Vit-D Deficiency, Fractures)  Rheumatoid arthritis involving multiple sites with positive rheumatoid factor ([place]) - Plan: CBC with Differential/Platelet, COMPLETE METABOLIC PANEL WITH GFR, CBC with Differential/Platelet, COMPLETE METABOLIC PANEL WITH GFR  High risk medication use - Plan: CBC with Differential/Platelet, COMPLETE METABOLIC PANEL WITH GFR, CBC with Differential/Platelet, COMPLETE METABOLIC PANEL WITH GFR  Primary osteoarthritis of both hands  History of total knee replacement, bilateral   Plan: #1: Rheumatoid arthritis. Doing well.  #2: High risk prescription on methotrexate 4 pills per week Folic acid 2 mg every day CBC with differential CMP with GFR todayand we will add vitamin D  #3:  Postmenopausal Patient needs an updated bone density. Patient states that she has not had a bone density done since she's been in [place]. She is moved from [place].  #4: Bilateral knee joint pain. Patient has a history of bilateral knee total replacement  #5: We tried to to draw labs today but we were unsuccessful. patient was advised to drink plenty of water prior to the lab draw. Patient is agreeable  Orders: Orders Placed This Encounter  Procedures  . DG Bone Density  .  CBC with Differential/Platelet  . COMPLETE METABOLIC PANEL WITH GFR  . VITAMIN D 25 Hydroxy (Vit-D Deficiency, Fractures)   No orders of the defined types were placed in this encounter.   Face-to-face time spent with patient was 30 minutes. 50% of time was spent in counseling and coordination of care.  Follow-Up Instructions: Return in about 5 months (around 10/20/2017) for RA,mtx 4/wk, folic 2 /d , oa hands, bil knee pain.   Eliezer Lofts, PA-C  Note - This record has been created using [employer].  Chart creation errors have been sought, but may not always  have been located. Such creation errors do not reflect on  the standard of medical care.

## 2017-05-25 ENCOUNTER — Ambulatory Visit: Payer: Medicare Other | Admitting: Rheumatology

## 2017-05-30 ENCOUNTER — Other Ambulatory Visit: Payer: Self-pay | Admitting: Rheumatology

## 2017-05-30 NOTE — Telephone Encounter (Addendum)
Last Visit: 05/22/17 Next Visit: 10/24/17 Labs: 06/01/17 stable TB Gold: 01/09/17 Neg   Okay to refill per Dr. Corliss Skains

## 2017-06-01 ENCOUNTER — Other Ambulatory Visit: Payer: Self-pay

## 2017-06-01 DIAGNOSIS — Z79899 Other long term (current) drug therapy: Secondary | ICD-10-CM

## 2017-06-01 LAB — CBC WITH DIFFERENTIAL/PLATELET
Basophils Absolute: 52 cells/uL (ref 0–200)
Basophils Relative: 0.8 %
Eosinophils Absolute: 52 cells/uL (ref 15–500)
Eosinophils Relative: 0.8 %
HCT: 38.1 % (ref 35.0–45.0)
Hemoglobin: 12.4 g/dL (ref 11.7–15.5)
Lymphs Abs: 2802 cells/uL (ref 850–3900)
MCH: 23.6 pg — ABNORMAL LOW (ref 27.0–33.0)
MCHC: 32.5 g/dL (ref 32.0–36.0)
MCV: 72.6 fL — ABNORMAL LOW (ref 80.0–100.0)
MPV: 11.9 fL (ref 7.5–12.5)
Monocytes Relative: 8.5 %
Neutro Abs: 3042 cells/uL (ref 1500–7800)
Neutrophils Relative %: 46.8 %
Platelets: 199 10*3/uL (ref 140–400)
RBC: 5.25 10*6/uL — ABNORMAL HIGH (ref 3.80–5.10)
RDW: 17.1 % — ABNORMAL HIGH (ref 11.0–15.0)
Total Lymphocyte: 43.1 %
WBC mixed population: 553 cells/uL (ref 200–950)
WBC: 6.5 10*3/uL (ref 3.8–10.8)

## 2017-06-01 LAB — COMPLETE METABOLIC PANEL WITH GFR
AG Ratio: 1.4 (calc) (ref 1.0–2.5)
ALT: 8 U/L (ref 6–29)
AST: 14 U/L (ref 10–35)
Albumin: 4.1 g/dL (ref 3.6–5.1)
Alkaline phosphatase (APISO): 68 U/L (ref 33–130)
BUN: 11 mg/dL (ref 7–25)
CO2: 33 mmol/L — ABNORMAL HIGH (ref 20–32)
Calcium: 9.3 mg/dL (ref 8.6–10.4)
Chloride: 102 mmol/L (ref 98–110)
Creat: 0.85 mg/dL (ref 0.60–0.88)
GFR, Est African American: 74 mL/min/{1.73_m2} (ref >=60)
GFR, Est Non African American: 64 mL/min/{1.73_m2} (ref >=60)
Globulin: 3 g/dL (calc) (ref 1.9–3.7)
Glucose, Bld: 85 mg/dL (ref 65–99)
Potassium: 4.7 mmol/L (ref 3.5–5.3)
Sodium: 141 mmol/L (ref 135–146)
Total Bilirubin: 0.4 mg/dL (ref 0.2–1.2)
Total Protein: 7.1 g/dL (ref 6.1–8.1)

## 2017-06-19 ENCOUNTER — Encounter: Payer: Self-pay | Admitting: Rheumatology

## 2017-06-19 DIAGNOSIS — Z1231 Encounter for screening mammogram for malignant neoplasm of breast: Secondary | ICD-10-CM | POA: Diagnosis not present

## 2017-06-19 DIAGNOSIS — M810 Age-related osteoporosis without current pathological fracture: Secondary | ICD-10-CM | POA: Diagnosis not present

## 2017-06-30 ENCOUNTER — Telehealth: Payer: Self-pay | Admitting: *Deleted

## 2017-06-30 NOTE — Telephone Encounter (Signed)
Left message to advise patient bone density scan show Osteoporosis. Advised patient to call with any questions or concerns.

## 2017-07-13 ENCOUNTER — Ambulatory Visit (INDEPENDENT_AMBULATORY_CARE_PROVIDER_SITE_OTHER): Payer: Medicare Other | Admitting: Neurology

## 2017-07-13 ENCOUNTER — Encounter: Payer: Self-pay | Admitting: Neurology

## 2017-07-13 VITALS — BP 109/63 | HR 58 | Ht 60.0 in | Wt 114.5 lb

## 2017-07-13 DIAGNOSIS — F329 Major depressive disorder, single episode, unspecified: Secondary | ICD-10-CM

## 2017-07-13 DIAGNOSIS — R413 Other amnesia: Secondary | ICD-10-CM | POA: Diagnosis not present

## 2017-07-13 DIAGNOSIS — F32A Depression, unspecified: Secondary | ICD-10-CM

## 2017-07-13 NOTE — Progress Notes (Signed)
PATIENT: Jordan [name] DOB: 03/07/1934  Chief Complaint  Patient presents with  . Hallucinations    She is here with her neice, Jordan [name].  Feels her hallucinations have improved.  She is still having difficulty sleeping.    [name] Hypertension    She has an appointment to see her PCP concerning her BP tomorrow.  They have not been checking it at home.   . Memory Loss    MMSE 25/30 - 5 animals.     HISTORICAL  Jordan [name] is 83 year old female, seen in refer by her primary care nurse practitioner Jordan [name] for evaluation of depression hallucinations, she is accompanied by her niece Jordan [name] at visit, initial evaluation was on April 12 2017.  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  She complains of depression, sad about her losing her independency, she has irregular sleeping pattern, has difficulty falling to sleep, despite taking Restoril 59m every night,  she was also noted to have mild memory loss.   Laboratory evaluations in 2018 showed normal CMP, CBC, negative Quantiferon TB antibody, normal ESR 31,  UPDATE Jul 13 2017: She is accompanied by her caregiver at today's clinical visit, also discussed her medical condition with her daughter Jordan [name] the phone,   She is overall  stable, continue complains of difficulty sleeping, taking Seroquel 25 mg 2 tablets every night, Personally reviewed MRI of the brain in October 2018, mild generalized atrophy, supratentorium small vessel disease. Laboratory evaluation in September 2018, normal vitamin BV85 folic acid, mild low titer positive RPR, FTA antibody,  CMP, CBC showed Hg 12.4  REVIEW OF SYSTEMS: Full 14 system review of systems performed and notable only for as above   ALLERGIES: Allergies  Allergen Reactions  . Penicillins Hives    HOME MEDICATIONS: Current Outpatient Medications  Medication Sig Dispense Refill  . albuterol (PROVENTIL HFA;VENTOLIN HFA) 108 (90 Base) MCG/ACT inhaler Inhale 1 puff into the lungs every 6 (six) hours as needed for wheezing or shortness of breath.    . brimonidine-timolol (COMBIGAN) 0.2-0.5 % ophthalmic solution Place 1 drop into both eyes every 12 (twelve) hours.    . citalopram (CELEXA) 10 MG tablet TK 1 T PO QD  2  . ENBREL SURECLICK 50 MG/ML injection USE 1 DEVICE TO INJECT UNDER THE SKIN ONCE A WEEK 11.76 mL 0  . fluticasone (FLONASE) 50 MCG/ACT nasal spray INHALE 2 SPRAYS  IN EACH NOSTRIL  Q DAY  1  . HYDROcodone-acetaminophen (NORCO) 10-325 MG tablet Take 1 tablet by mouth every 6 (six) hours as needed.    . latanoprost (XALATAN) 0.005 % ophthalmic solution Place 1 drop into both eyes nightly.    . levothyroxine (SYNTHROID, LEVOTHROID) 100 MCG tablet TK 1 T PO QD  2  . lisinopril (PRINIVIL,ZESTRIL) 20 MG  tablet Take 1 tablet (20 mg total) by mouth 2 (two) times daily. Hold blood pressure medication if BP is less than 130/80 60 tablet 11  . loratadine (CLARITIN) 10 MG tablet TK 1 T PO  QD.  1  . methotrexate (RHEUMATREX) 2.5 MG tablet TAKE 4 TABLETS BY MOUTH ONCE A WEEK 48 tablet 0  . NAMZARIC 28-10 MG CP24 TK 1 C PO QPM  2  . omeprazole (PRILOSEC) 20 MG capsule Take 20 mg by mouth daily.    . QUEtiapine (SEROQUEL) 25 MG tablet Take 2 tablets (50 mg total) by mouth at bedtime.  60 tablet 11  . simvastatin (ZOCOR) 40 MG tablet Take 40 mg by mouth daily.    . temazepam (RESTORIL) 15 MG capsule Take 15 mg by mouth at bedtime as needed for sleep.     No current facility-administered medications for this visit.     PAST MEDICAL HISTORY: Past Medical History:  Diagnosis Date  . Depression   . GERD (gastroesophageal reflux disease)   . High cholesterol   . Hypertension   . Stroke [facility])     PAST SURGICAL HISTORY: Past Surgical History:  Procedure Laterality Date  . ABDOMINAL HYSTERECTOMY    . REPLACEMENT TOTAL KNEE BILATERAL Bilateral   . WRIST SURGERY Left     FAMILY HISTORY: History reviewed. No pertinent family history.  SOCIAL HISTORY:  Social History   Socioeconomic History  . Marital status: Single    Spouse name: Not on file  . Number of children: 2  . Years of education: 8th  . Highest education level: Not on file  Social Needs  . Financial resource strain: Not on file  . Food insecurity - worry: Not on file  . Food insecurity - inability: Not on file  . Transportation needs - medical: Not on file  . Transportation needs - non-medical: Not on file  Occupational History  . Occupation: Retired  Tobacco Use  . Smoking status: Former Smoker    Packs/day: 0.25    Years: 13.00    Pack years: 3.25    Types: Cigarettes    Last attempt to quit: 08/18/2006    Years since quitting: 10.9  . Smokeless tobacco: Never Used  Substance and Sexual Activity  . Alcohol use: No  . Drug use: No  . Sexual activity: Not on file  Other Topics Concern  . Not on file  Social History Narrative   Lives at home with grandson.   Right-handed.   2-3 cups coffee per day.     PHYSICAL EXAM   Vitals:   07/13/17 1201  BP: 109/63  Pulse: (!) 58  Weight: 114 lb 8 oz (51.9 kg)  Height: 5' (1.524 m)    Not recorded      Body mass index is 22.36 kg/m.  PHYSICAL EXAMNIATION:  Gen: NAD, conversant, well nourised, obese, well groomed                      Cardiovascular: Regular rate rhythm, no peripheral edema, warm, nontender. Eyes: Conjunctivae clear without exudates or hemorrhage Neck: Supple, no carotid bruits. Pulmonary: Clear to auscultation bilaterally   NEUROLOGICAL EXAM:  MMSE - Mini Mental State Exam 07/13/2017  Orientation to time 3  Orientation to Place 5  Registration 3  Attention/ Calculation 5  Recall 0  Language- name 2 objects 2  Language- repeat 1  Language- follow 3 step command 3  Language- read & follow direction 1  Write  a sentence 1  Copy design 1  Total score 25  animal naming 5.    CRANIAL NERVES: CN II:  . Pupils are irregular CN III, IV, VI: extraocular movement are normal. No ptosis. CN V: Facial sensation is intact to pinprick in all 3 divisions bilaterally. Corneal responses are intact.  CN VII: Face is symmetric with normal eye closure and smile. CN VIII: Hearing is normal to rubbing fingers CN IX, X: Palate elevates symmetrically. Phonation is normal. CN XI: Head turning and shoulder shrug are intact CN XII: Tongue is midline with normal movements and no atrophy.  MOTOR: There is no pronator drift of out-stretched arms. Muscle bulk and tone are normal. Muscle strength is normal.  REFLEXES: Reflexes are 2+ and symmetric at the biceps, triceps, knees, and ankles. Plantar responses are flexor.  SENSORY: Intact to light touch, pinprick, positional sensation and vibratory sensation are intact in fingers and toes.  COORDINATION: Rapid alternating movements and fine finger movements are intact. There is no dysmetria on finger-to-nose and heel-knee-shin.    GAIT/STANCE: Need to push on chair to getup, cautious gait.   DIAGNOSTIC DATA (LABS, IMAGING, TESTING) - I reviewed patient records, labs, notes, testing and imaging myself where available.   ASSESSMENT AND PLAN  Jordan [name] is a 83 y.o. female   Mild cognitive impairment  MRI of the brain showed mild mild generalized  atrophy  Laboratory evaluations showed low titer positive RPR, positive FTA antibody  If she has sudden worsening, may consider extensive evaluation such as lumbar puncture, we will hold it off for right now  Depression insomnia  Keep seroquel 25 mg titrating to 50 mg every night   Marcial Pacas, M.D. Ph.D.  [facility] [address] [place], [place] [address] Ph: [phone] Fax: [phone]  CC: Jordan [name]

## 2017-07-26 DIAGNOSIS — J029 Acute pharyngitis, unspecified: Secondary | ICD-10-CM | POA: Diagnosis not present

## 2017-07-26 DIAGNOSIS — E039 Hypothyroidism, unspecified: Secondary | ICD-10-CM | POA: Diagnosis not present

## 2017-07-26 DIAGNOSIS — R51 Headache: Secondary | ICD-10-CM | POA: Diagnosis not present

## 2017-07-26 DIAGNOSIS — E782 Mixed hyperlipidemia: Secondary | ICD-10-CM | POA: Diagnosis not present

## 2017-07-26 DIAGNOSIS — H409 Unspecified glaucoma: Secondary | ICD-10-CM | POA: Diagnosis not present

## 2017-07-31 DIAGNOSIS — H44512 Absolute glaucoma, left eye: Secondary | ICD-10-CM | POA: Diagnosis not present

## 2017-07-31 DIAGNOSIS — H401113 Primary open-angle glaucoma, right eye, severe stage: Secondary | ICD-10-CM | POA: Diagnosis not present

## 2017-08-22 ENCOUNTER — Other Ambulatory Visit: Payer: Self-pay | Admitting: Rheumatology

## 2017-08-22 NOTE — Telephone Encounter (Signed)
Last Visit: 05/22/17 Next Visit: 10/24/17 Labs: 06/01/18 stable  Okay to refill per Dr. Corliss Skains

## 2017-08-24 ENCOUNTER — Other Ambulatory Visit: Payer: Self-pay | Admitting: Rheumatology

## 2017-08-24 NOTE — Telephone Encounter (Signed)
Last Visit: 05/22/17 Next Visit: 10/24/17 Labs: 06/01/18 stable TB Gold: 01/09/17 Neg   Okay to refill per Dr. Corliss Skains

## 2017-10-20 NOTE — Progress Notes (Deleted)
Office Visit Note  Patient: Jordan [name]             Date of Birth: 28-Aug-1933           MRN: [id number]             PCP: Arnette Felts Referring: Arnette Felts, FNP Visit Date: 10/24/2017 Occupation: @GUAROCC @    Subjective:  No chief complaint on file.   History of Present Illness: Jordan [name] is a 84 y.o. female ***   Activities of Daily Living:  Patient reports morning stiffness for *** {minute/hour:19697}.   Patient {ACTIONS;DENIES/REPORTS:21021675::"Denies"} nocturnal pain.  Difficulty dressing/grooming: {ACTIONS;DENIES/REPORTS:21021675::"Denies"} Difficulty climbing stairs: {ACTIONS;DENIES/REPORTS:21021675::"Denies"} Difficulty getting out of chair: {ACTIONS;DENIES/REPORTS:21021675::"Denies"} Difficulty using hands for taps, buttons, cutlery, and/or writing: {ACTIONS;DENIES/REPORTS:21021675::"Denies"}   No Rheumatology ROS completed.   PMFS History:  Patient Active Problem List   Diagnosis Date Noted  . Memory loss 04/12/2017  . Depression 04/12/2017  . Rheumatoid arthritis involving multiple sites with positive rheumatoid factor (HCC) 08/17/2016  . High risk medication use 08/17/2016  . H/O total knee replacement, bilateral 08/17/2016  . Spondylosis of lumbar region without myelopathy or radiculopathy 08/17/2016  . History of glaucoma/ patient is legally blind 08/17/2016  . History of gastroesophageal reflux (GERD) 08/17/2016  . History of diverticulosis 08/17/2016  . History of hypothyroidism 08/17/2016  . history of asthmatic bronchitis 08/17/2016  . History of memory loss 08/17/2016  . History of atherosclerosis 08/17/2016    Past Medical History:  Diagnosis Date  . Depression   . GERD (gastroesophageal reflux disease)   . High cholesterol   . Hypertension   . Stroke [facility])     No family history on file. Past Surgical History:  Procedure Laterality Date  . ABDOMINAL HYSTERECTOMY    . REPLACEMENT TOTAL KNEE BILATERAL Bilateral   . WRIST  SURGERY Left    Social History   Social History Narrative   Lives at home with grandson.   Right-handed.   2-3 cups coffee per day.     Objective: Vital Signs: There were no vitals taken for this visit.   Physical Exam   Musculoskeletal Exam: ***  CDAI Exam: No CDAI exam completed.    Investigation: No additional findings.TB Gold: 01/09/2017 Negative  CBC Latest Ref Rng & Units 06/01/2017 01/09/2017 10/05/2016  WBC 3.8 - 10.8 Thousand/uL 6.5 6.7 5.9  Hemoglobin 11.7 - 15.5 g/dL 10/07/2016 11.2(L) 12.1  Hematocrit 35.0 - 45.0 % 38.1 35.0 37.2  Platelets 140 - 400 Thousand/uL 199 248 194   CMP Latest Ref Rng & Units 06/01/2017 01/09/2017 10/05/2016  Glucose 65 - 99 mg/dL 85 10/07/2016) 90  BUN 7 - 25 mg/dL 11 14 17   Creatinine 0.60 - 0.88 mg/dL 915(A ) 5.69)  Sodium 135 - 146 mmol/L 141 139 139  Potassium 3.5 - 5.3 mmol/L 4.7 4.8 4.9  Chloride 98 - 110 mmol/L 102 102 102  CO2 20 - 32 mmol/L 33(H) 26 31  Calcium 8.6 - 10.4 mg/dL 9.3 9.4 7.94(I  Total Protein 6.1 - 8.1 g/dL 7.1 6.6 7.3  Total Bilirubin 0.2 - 1.2 mg/dL 0.4 0.6 0.6  Alkaline Phos 33 - 130 U/L - 65 48  AST 10 - 35 U/L 14 14 15   ALT 6 - 29 U/L 8 9 7     Imaging: No results found.  Speciality Comments: Tb Gold: 01/09/17 Neg    Procedures:  No procedures performed Allergies: Penicillins   Assessment / Plan:     Visit Diagnoses: No diagnosis found.  Orders: No orders of the defined types were placed in this encounter.  No orders of the defined types were placed in this encounter.   Face-to-face time spent with patient was *** minutes. 50% of time was spent in counseling and coordination of care.  Follow-Up Instructions: No follow-ups on file.   Ellen Henri, CMA  Note - This record has been created using Animal nutritionist.  Chart creation errors have been sought, but may not always  have been located. Such creation errors do not reflect on  the standard of medical care.

## 2017-10-24 ENCOUNTER — Ambulatory Visit: Payer: Medicare Other | Admitting: Rheumatology

## 2017-10-24 NOTE — Progress Notes (Signed)
Office Visit Note  Patient: Jordan [name]             Date of Birth: 01/01/1934           MRN: [id number]             PCP: Arnette Felts Referring: Arnette Felts, FNP Visit Date: 10/25/2017 Occupation: @GUAROCC @    Subjective:  Trigger fingers   History of Present Illness: Jordan [name] is a 84 y.o. female with history of seropositive rheumatoid arthritis spondylosis of the lumbar spine.  Patient states that she continues to inject Enbrel every week, takes methotrexate 4 tablets every week and folic acid 1 mg daily.  She denies missing any doses of her medications.  She states that she feels like she has been doing well on the combination of Enbrel and methotrexate.  She denies any rheumatoid arthritis flares recently.  She denies any joint swelling at this time.  She states that she has occasional discomfort in her left hand.  She states that she quite a bit of joint stiffness especially in her left hand in the mornings.  She states that she has had recurrent left ringer and middle trigger fingers and has had cortisone injections in the past.  She states that these fingers continue to lock up and she would like a cortisone injection.  She states that her bilateral knee replacements are doing well.  She denies any swelling in these joints.  She states she walks with a cane.  She says that her lower back has been doing well.   Activities of Daily Living:  Patient reports morning stiffness for 2-3  hours.   Patient Reports nocturnal pain.  Difficulty dressing/grooming: Denies Difficulty climbing stairs: Denies Difficulty getting out of chair: Denies Difficulty using hands for taps, buttons, cutlery, and/or writing: Reports   Review of Systems  Constitutional: Positive for fatigue.  HENT: Negative for mouth sores, trouble swallowing, trouble swallowing, mouth dryness and nose dryness.   Eyes: Positive for redness and dryness. Negative for pain and visual disturbance.  Respiratory:  Positive for cough (Dry cough). Negative for hemoptysis, shortness of breath and difficulty breathing.   Cardiovascular: Negative for chest pain, palpitations, hypertension and swelling in legs/feet.  Gastrointestinal: Negative for blood in stool, constipation and diarrhea.  Endocrine: Negative for increased urination.  Genitourinary: Negative for painful urination.  Musculoskeletal: Positive for arthralgias, joint pain and morning stiffness. Negative for joint swelling, myalgias, muscle weakness, muscle tenderness and myalgias.  Skin: Negative for color change, pallor, rash, hair loss, nodules/bumps, skin tightness, ulcers and sensitivity to sunlight.  Allergic/Immunologic: Negative for susceptible to infections.  Neurological: Negative for dizziness, numbness, headaches and weakness.  Hematological: Negative for swollen glands.  Psychiatric/Behavioral: Positive for sleep disturbance. Negative for depressed mood. The patient is not nervous/anxious.     PMFS History:  Patient Active Problem List   Diagnosis Date Noted  . Memory loss 04/12/2017  . Depression 04/12/2017  . Rheumatoid arthritis involving multiple sites with positive rheumatoid factor (HCC) 08/17/2016  . High risk medication use 08/17/2016  . H/O total knee replacement, bilateral 08/17/2016  . Spondylosis of lumbar region without myelopathy or radiculopathy 08/17/2016  . History of glaucoma/ patient is legally blind 08/17/2016  . History of gastroesophageal reflux (GERD) 08/17/2016  . History of diverticulosis 08/17/2016  . History of hypothyroidism 08/17/2016  . history of asthmatic bronchitis 08/17/2016  . History of memory loss 08/17/2016  . History of atherosclerosis 08/17/2016    Past Medical History:  Diagnosis Date  . Depression   . GERD (gastroesophageal reflux disease)   . High cholesterol   . Hypertension   . Stroke [facility])     History reviewed. No pertinent family history. Past Surgical History:    Procedure Laterality Date  . ABDOMINAL HYSTERECTOMY    . REPLACEMENT TOTAL KNEE BILATERAL Bilateral   . WRIST SURGERY Left    Social History   Social History Narrative   Lives at home with grandson.   Right-handed.   2-3 cups coffee per day.     Objective: Vital Signs: BP (!) 155/92 (BP Location: Left Arm, Patient Position: Sitting, Cuff Size: Normal)   Pulse 77   Resp 14   Ht 5\' 1"  (1.549 m)   Wt 116 lb (52.6 kg)   BMI 21.92 kg/m    Physical Exam  Constitutional: She is oriented to person, place, and time. She appears well-developed and well-nourished.  HENT:  Head: Normocephalic and atraumatic.  Eyes: Conjunctivae and EOM are normal.  Neck: Normal range of motion.  Cardiovascular: Normal rate, regular rhythm, normal heart sounds and intact distal pulses.  Pulmonary/Chest: Effort normal. She has wheezes.  Abdominal: Soft. Bowel sounds are normal.  Lymphadenopathy:    She has no cervical adenopathy.  Neurological: She is alert and oriented to person, place, and time.  Skin: Skin is warm and dry. Capillary refill takes less than 2 seconds.  Psychiatric: She has a normal mood and affect. Her behavior is normal.  Nursing note and vitals reviewed.    Musculoskeletal Exam: C-spine some limited range of motion with lateral rotation.  Thoracic kyphosis.  Limited ROM of lumbar spine.  No midline spinal tenderness.  No SI joint tenderness.  Shoulder joints, elbow joints, wrist joints, MCPs, PIPs, and DIPs good ROM with no synovitis.  She has tenderness of all MCPs.  PIP and DIP synovial thickening consistent with osteoarthritis. Trigger finger left ring and middle finger noted.  Hip joints, knee joints, ankle joints, MTPs, PIPs, and DIPs good ROM with no synovitis.  No tenderness of trochanteric bursa.  No tenderness of MTPs.  No warmth or effusion of bilateral knees.     CDAI Exam: CDAI Homunculus Exam:   Swelling:  Right hand: 3rd PIP Left hand: 2nd MCP  Joint Counts:   CDAI Tender Joint count: 0 CDAI Swollen Joint count: 2  Global Assessments:  Patient Global Assessment: 8 Provider Global Assessment: 3  CDAI Calculated Score: 13    Investigation: No additional findings. CBC Latest Ref Rng & Units 06/01/2017 01/09/2017 10/05/2016  WBC 3.8 - 10.8 Thousand/uL 6.5 6.7 5.9  Hemoglobin 11.7 - 15.5 g/dL 61.4 11.2(L) 12.1  Hematocrit 35.0 - 45.0 % 38.1 35.0 37.2  Platelets 140 - 400 Thousand/uL 199 248 194   CMP Latest Ref Rng & Units 06/01/2017 01/09/2017 10/05/2016  Glucose 65 - 99 mg/dL 85 431(V) 90  BUN 7 - 25 mg/dL 11 14 17   Creatinine 0.60 - 0.88 mg/dL 4.00 8.67(Y) 1.95(K)  Sodium 135 - 146 mmol/L 141 139 139  Potassium 3.5 - 5.3 mmol/L 4.7 4.8 4.9  Chloride 98 - 110 mmol/L 102 102 102  CO2 20 - 32 mmol/L 33(H) 26 31  Calcium 8.6 - 10.4 mg/dL 9.3 9.4 93.2  Total Protein 6.1 - 8.1 g/dL 7.1 6.6 7.3  Total Bilirubin 0.2 - 1.2 mg/dL 0.4 0.6 0.6  Alkaline Phos 33 - 130 U/L - 65 48  AST 10 - 35 U/L 14 14 15   ALT 6 - 29  U/L 8 9 7     Imaging: No results found.  Speciality Comments: Tb Gold: 01/09/17 Neg    Procedures:  No procedures performed Allergies: Penicillins   Assessment / Plan:     Visit Diagnoses: Rheumatoid arthritis involving multiple sites with positive rheumatoid factor (HCC): She has no synovitis on exam.  She has some tenderness of her MCP joints.  No tenderness of MTPs.  She has not had any recent flares of her rheumatoid arthritis.  She has not missed any doses of her medications.  She is clinically doing well on Enbrel every week, methotrexate 4 tablets weekly and folic acid 1 mg daily.  High risk medication use - Enbrel 50 mg sq  qweek, MTX 4tabs po qwk, Folic acid 1mg  po qd CBC and CBC BMP will be drawn today to monitor for drug toxicity.  She will return in July and every 3 months for lab work.- Plan: CBC with Differential/Platelet, COMPLETE METABOLIC PANEL WITH GFR  H/O total knee replacement, bilateral: Doing well.  No  warmth or effusion on exam.   Spondylosis of lumbar region without myelopathy or radiculopathy: No midline spinal tenderness or discomfort at this time.   Other medical conditions are listed as follows:  History of glaucoma/ patient is legally blind  History of gastroesophageal reflux (GERD)  History of hypothyroidism  history of asthmatic bronchitis  History of memory loss  History of diverticulosis  History of atherosclerosis  Trigger finger, left middle finger  Trigger finger, left ring finger    Orders: Orders Placed This Encounter  Procedures  . CBC with Differential/Platelet  . COMPLETE METABOLIC PANEL WITH GFR   No orders of the defined types were placed in this encounter.   Face-to-face time spent with patient was 30 minutes. >50% of time was spent in counseling and coordination of care.  Follow-Up Instructions: Return in about 5 months (around 03/27/2018) for Rheumatoid arthritis.   August, PA-C   I examined and evaluated the patient with 05/27/2018 PA. The plan of care was discussed as noted above.  Gearldine Bienenstock, MD  Note - This record has been created using Sherron Ales.  Chart creation errors have been sought, but may not always  have been located. Such creation errors do not reflect on  the standard of medical care.

## 2017-10-25 ENCOUNTER — Encounter: Payer: Self-pay | Admitting: Physician Assistant

## 2017-10-25 ENCOUNTER — Ambulatory Visit (INDEPENDENT_AMBULATORY_CARE_PROVIDER_SITE_OTHER): Payer: Medicare Other | Admitting: Physician Assistant

## 2017-10-25 VITALS — BP 155/92 | HR 77 | Resp 14 | Ht 61.0 in | Wt 116.0 lb

## 2017-10-25 DIAGNOSIS — Z8669 Personal history of other diseases of the nervous system and sense organs: Secondary | ICD-10-CM

## 2017-10-25 DIAGNOSIS — M47816 Spondylosis without myelopathy or radiculopathy, lumbar region: Secondary | ICD-10-CM

## 2017-10-25 DIAGNOSIS — Z87898 Personal history of other specified conditions: Secondary | ICD-10-CM | POA: Diagnosis not present

## 2017-10-25 DIAGNOSIS — Z8639 Personal history of other endocrine, nutritional and metabolic disease: Secondary | ICD-10-CM | POA: Diagnosis not present

## 2017-10-25 DIAGNOSIS — M0579 Rheumatoid arthritis with rheumatoid factor of multiple sites without organ or systems involvement: Secondary | ICD-10-CM | POA: Diagnosis not present

## 2017-10-25 DIAGNOSIS — Z96653 Presence of artificial knee joint, bilateral: Secondary | ICD-10-CM | POA: Diagnosis not present

## 2017-10-25 DIAGNOSIS — Z8719 Personal history of other diseases of the digestive system: Secondary | ICD-10-CM | POA: Diagnosis not present

## 2017-10-25 DIAGNOSIS — Z8679 Personal history of other diseases of the circulatory system: Secondary | ICD-10-CM | POA: Diagnosis not present

## 2017-10-25 DIAGNOSIS — M65342 Trigger finger, left ring finger: Secondary | ICD-10-CM | POA: Diagnosis not present

## 2017-10-25 DIAGNOSIS — Z79899 Other long term (current) drug therapy: Secondary | ICD-10-CM

## 2017-10-25 DIAGNOSIS — M65332 Trigger finger, left middle finger: Secondary | ICD-10-CM

## 2017-10-25 DIAGNOSIS — Z8709 Personal history of other diseases of the respiratory system: Secondary | ICD-10-CM | POA: Diagnosis not present

## 2017-10-25 NOTE — Patient Instructions (Signed)
Standing Labs We placed an order today for your standing lab work.    Please come back and get your standing labs in July and every 3 months     TB gold, CBC, and CMP    We have open lab Monday through Friday from 8:30-11:30 AM and 1:30-4:00 PM  at the office of Dr. Pollyann Savoy.   You may experience shorter wait times on Monday and Friday afternoons. The office is located at [address], [place], [place] [address] No appointment is necessary.   Labs are drawn by [facility].  You may receive a bill from [facility] for your lab work. If you have any questions regarding directions or hours of operation,  please call [phone].

## 2017-11-13 ENCOUNTER — Other Ambulatory Visit: Payer: Self-pay | Admitting: Rheumatology

## 2017-11-14 NOTE — Telephone Encounter (Signed)
Last visit: 10/25/17 Next visit: 03/29/18 Labs: 06/01/17 stable TB gold: 01/09/17 neg   Patient will be in office 11/15/17. Will have patient update labs.   Okay to refill 30 day supply per Dr. Corliss Skains

## 2017-11-15 ENCOUNTER — Other Ambulatory Visit: Payer: Self-pay | Admitting: *Deleted

## 2017-11-15 ENCOUNTER — Ambulatory Visit (INDEPENDENT_AMBULATORY_CARE_PROVIDER_SITE_OTHER): Payer: Medicare Other | Admitting: Rheumatology

## 2017-11-15 DIAGNOSIS — Z79899 Other long term (current) drug therapy: Secondary | ICD-10-CM | POA: Diagnosis not present

## 2017-11-15 DIAGNOSIS — M65332 Trigger finger, left middle finger: Secondary | ICD-10-CM

## 2017-11-15 DIAGNOSIS — M65342 Trigger finger, left ring finger: Secondary | ICD-10-CM

## 2017-11-15 DIAGNOSIS — M0579 Rheumatoid arthritis with rheumatoid factor of multiple sites without organ or systems involvement: Secondary | ICD-10-CM

## 2017-11-15 LAB — CBC WITH DIFFERENTIAL/PLATELET
Basophils Absolute: 58 cells/uL (ref 0–200)
Basophils Relative: 0.8 %
Eosinophils Absolute: 79 cells/uL (ref 15–500)
Eosinophils Relative: 1.1 %
HCT: 36 % (ref 35.0–45.0)
Hemoglobin: 11.5 g/dL — ABNORMAL LOW (ref 11.7–15.5)
Lymphs Abs: 3398 cells/uL (ref 850–3900)
MCH: 24.3 pg — ABNORMAL LOW (ref 27.0–33.0)
MCHC: 31.9 g/dL — ABNORMAL LOW (ref 32.0–36.0)
MCV: 76.1 fL — ABNORMAL LOW (ref 80.0–100.0)
MPV: 12.4 fL (ref 7.5–12.5)
Monocytes Relative: 7.2 %
Neutro Abs: 3146 cells/uL (ref 1500–7800)
Neutrophils Relative %: 43.7 %
Platelets: 227 10*3/uL (ref 140–400)
RBC: 4.73 10*6/uL (ref 3.80–5.10)
RDW: 16.7 % — ABNORMAL HIGH (ref 11.0–15.0)
Total Lymphocyte: 47.2 %
WBC mixed population: 518 cells/uL (ref 200–950)
WBC: 7.2 10*3/uL (ref 3.8–10.8)

## 2017-11-15 LAB — COMPLETE METABOLIC PANEL WITH GFR
AG Ratio: 1.5 (calc) (ref 1.0–2.5)
ALT: 7 U/L (ref 6–29)
AST: 13 U/L (ref 10–35)
Albumin: 3.9 g/dL (ref 3.6–5.1)
Alkaline phosphatase (APISO): 62 U/L (ref 33–130)
BUN/Creatinine Ratio: 9 (calc) (ref 6–22)
BUN: 8 mg/dL (ref 7–25)
CO2: 29 mmol/L (ref 20–32)
Calcium: 9 mg/dL (ref 8.6–10.4)
Chloride: 105 mmol/L (ref 98–110)
Creat: 0.91 mg/dL — ABNORMAL HIGH (ref 0.60–0.88)
GFR, Est African American: 69 mL/min/{1.73_m2} (ref >=60)
GFR, Est Non African American: 59 mL/min/{1.73_m2} — ABNORMAL LOW (ref >=60)
Globulin: 2.6 g/dL (calc) (ref 1.9–3.7)
Glucose, Bld: 74 mg/dL (ref 65–99)
Potassium: 4.5 mmol/L (ref 3.5–5.3)
Sodium: 141 mmol/L (ref 135–146)
Total Bilirubin: 0.4 mg/dL (ref 0.2–1.2)
Total Protein: 6.5 g/dL (ref 6.1–8.1)

## 2017-11-15 MED ORDER — LIDOCAINE HCL 1 % IJ SOLN
0.5000 mL | INTRAMUSCULAR | Status: AC | PRN
  Administered 2017-11-15: .5 mL

## 2017-11-15 MED ORDER — TRIAMCINOLONE ACETONIDE 40 MG/ML IJ SUSP
10.0000 mg | INTRAMUSCULAR | Status: AC | PRN
  Administered 2017-11-15: 10 mg

## 2017-11-15 NOTE — Progress Notes (Signed)
   Procedure Note  Patient: Jordan [name]             Date of Birth: [birth year]           MRN: [id number]             Visit Date: 11/15/2017  Procedures: Visit Diagnoses: Trigger middle finger of right hand  Rheumatoid arthritis involving multiple sites with positive rheumatoid factor (HCC) - Plan: CANCELED: COMPLETE METABOLIC PANEL WITH GFR, CANCELED: CBC with Differential/Platelet  High risk medication use - Plan: CANCELED: COMPLETE METABOLIC PANEL WITH GFR, CANCELED: CBC with Differential/Platelet  Trigger ring finger of right hand  Hand/UE Inj: L long A1 for trigger finger on 11/15/2017 2:09 PM Indications: pain, tendon swelling and therapeutic Details: 27 G needle, ultrasound-guided volar approach Medications: 0.5 mL lidocaine 1 %; 10 mg triamcinolone acetonide 40 MG/ML Aspirate: 0 mL Procedure, treatment alternatives, risks and benefits explained, specific risks discussed. Consent was given by the patient. Immediately prior to procedure a time out was called to verify the correct patient, procedure, equipment, support staff and site/side marked as required. Patient was prepped and draped in the usual sterile fashion.   Hand/UE Inj: L ring A1 for trigger finger on 11/15/2017 2:11 PM Indications: pain, tendon swelling and therapeutic Details: 27 G needle, ultrasound-guided volar approach Medications: 0.5 mL lidocaine 1 %; 10 mg triamcinolone acetonide 40 MG/ML Aspirate: 0 mL; sent for lab analysis Outcome: tolerated well, no immediate complications Procedure, treatment alternatives, risks and benefits explained, specific risks discussed. Consent was given by the patient.    Pollyann Savoy, MD

## 2017-11-16 DIAGNOSIS — H401113 Primary open-angle glaucoma, right eye, severe stage: Secondary | ICD-10-CM | POA: Diagnosis not present

## 2017-11-16 DIAGNOSIS — H44512 Absolute glaucoma, left eye: Secondary | ICD-10-CM | POA: Diagnosis not present

## 2017-11-16 NOTE — Progress Notes (Signed)
GFR is low but is stable

## 2017-12-05 ENCOUNTER — Other Ambulatory Visit: Payer: Self-pay | Admitting: Rheumatology

## 2017-12-05 NOTE — Telephone Encounter (Signed)
Last visit: 10/25/17 Next visit: 03/29/18 Labs: 11/15/17 GFR is low but is stable.  Okay to refill per Dr. Corliss Skains

## 2017-12-08 ENCOUNTER — Telehealth: Payer: Self-pay | Admitting: Rheumatology

## 2017-12-08 MED ORDER — ETANERCEPT 50 MG/ML ~~LOC~~ SOAJ: SUBCUTANEOUS | 0 refills | Status: DC

## 2017-12-08 NOTE — Telephone Encounter (Signed)
Last visit: 10/25/17 Next visit: 03/29/18 Labs: 11/15/17 GFR is low but is stable. TB gold: 01/09/17 neg   Okay to refill per Dr. Corliss Skains

## 2017-12-08 NOTE — Telephone Encounter (Signed)
Patient's daughter called requesting prescription refill of Enbrel to be called into [facility] on [address].  [name] states that she only has 1 Enbrel injection remaining.

## 2017-12-14 DIAGNOSIS — M069 Rheumatoid arthritis, unspecified: Secondary | ICD-10-CM | POA: Diagnosis not present

## 2017-12-14 DIAGNOSIS — E782 Mixed hyperlipidemia: Secondary | ICD-10-CM | POA: Diagnosis not present

## 2017-12-14 DIAGNOSIS — I10 Essential (primary) hypertension: Secondary | ICD-10-CM | POA: Diagnosis not present

## 2017-12-14 DIAGNOSIS — N390 Urinary tract infection, site not specified: Secondary | ICD-10-CM | POA: Diagnosis not present

## 2017-12-14 DIAGNOSIS — R7309 Other abnormal glucose: Secondary | ICD-10-CM | POA: Diagnosis not present

## 2017-12-14 DIAGNOSIS — E039 Hypothyroidism, unspecified: Secondary | ICD-10-CM | POA: Diagnosis not present

## 2017-12-14 NOTE — Progress Notes (Signed)
Office Visit Note  Patient: Jordan [name]             Date of Birth: [birth year]           MRN: [id number]             PCP: Arnette Felts Referring: Arnette Felts, FNP Visit Date: 12/19/2017 Occupation: @GUAROCC @    Subjective:  Knee Pain (Bil knee pain, swelling)   History of Present Illness: Jordan [name] is a 84 y.o. female with history of rheumatoid arthritis and osteoarthritis.  She states she has been having runny nose and cold for the last 2 days.  She is also having discomfort and pain in her left knee which is been replaced.  She states she is been having increased swelling.  None of the other joints are painful or swollen.  She states she is doing really well on combination of Enbrel and methotrexate.  I also had conversation with her daughter 97 through the phone.  She was accompanied by her caretaker.  Activities of Daily Living:  Patient reports morning stiffness for 45 minutes.   Patient Reports nocturnal pain.  Difficulty dressing/grooming: Reports Difficulty climbing stairs: Reports Difficulty getting out of chair: Reports Difficulty using hands for taps, buttons, cutlery, and/or writing: Reports   Review of Systems  Constitutional: Positive for fatigue. Negative for activity change, night sweats, weight gain and weight loss.  HENT: Negative for mouth sores, trouble swallowing, trouble swallowing, mouth dryness and nose dryness.   Eyes: Positive for dryness. Negative for pain, redness, itching and visual disturbance.  Respiratory: Positive for cough. Negative for shortness of breath, wheezing and difficulty breathing.   Cardiovascular: Negative for chest pain, palpitations, hypertension, irregular heartbeat and swelling in legs/feet.  Gastrointestinal: Positive for constipation and nausea. Negative for blood in stool, diarrhea and heartburn.  Endocrine: Positive for excessive thirst. Negative for cold intolerance and increased urination.  Genitourinary:  Negative for difficulty urinating and vaginal dryness.  Musculoskeletal: Positive for arthralgias, gait problem, joint pain, muscle weakness, morning stiffness and muscle tenderness. Negative for joint swelling, myalgias and myalgias.  Skin: Negative for color change, rash, hair loss, skin tightness, ulcers and sensitivity to sunlight.  Allergic/Immunologic: Negative for susceptible to infections.  Neurological: Negative for dizziness, light-headedness, memory loss, night sweats and weakness.  Hematological: Negative for bruising/bleeding tendency and swollen glands.  Psychiatric/Behavioral: Positive for sleep disturbance. Negative for depressed mood. The patient is not nervous/anxious.     PMFS History:  Patient Active Problem List   Diagnosis Date Noted  . Memory loss 04/12/2017  . Depression 04/12/2017  . Rheumatoid arthritis involving multiple sites with positive rheumatoid factor (HCC) 08/17/2016  . High risk medication use 08/17/2016  . H/O total knee replacement, bilateral 08/17/2016  . Spondylosis of lumbar region without myelopathy or radiculopathy 08/17/2016  . History of glaucoma/ patient is legally blind 08/17/2016  . History of gastroesophageal reflux (GERD) 08/17/2016  . History of diverticulosis 08/17/2016  . History of hypothyroidism 08/17/2016  . history of asthmatic bronchitis 08/17/2016  . History of memory loss 08/17/2016  . History of atherosclerosis 08/17/2016    Past Medical History:  Diagnosis Date  . Depression   . GERD (gastroesophageal reflux disease)   . High cholesterol   . Hypertension   . Stroke [facility])     History reviewed. No pertinent family history. Past Surgical History:  Procedure Laterality Date  . ABDOMINAL HYSTERECTOMY    . REPLACEMENT TOTAL KNEE BILATERAL Bilateral   . WRIST SURGERY Left  Social History   Social History Narrative   Lives at home with grandson.   Right-handed.   2-3 cups coffee per day.     Objective: Vital  Signs: BP (!) 144/60 (BP Location: Left Arm, Patient Position: Sitting, Cuff Size: Normal)   Pulse 65   Resp 16   Ht 5' 1.5" (1.562 m)   Wt 117 lb (53.1 kg)   BMI 21.75 kg/m    Physical Exam  Constitutional: She is oriented to person, place, and time. She appears well-developed and well-nourished.  HENT:  Head: Normocephalic and atraumatic.  Eyes: Conjunctivae and EOM are normal.  She is legally blind.  Neck: Normal range of motion.  Cardiovascular: Normal rate, regular rhythm, normal heart sounds and intact distal pulses.  Pulmonary/Chest: Effort normal and breath sounds normal.  Abdominal: Soft. Bowel sounds are normal.  Lymphadenopathy:    She has no cervical adenopathy.  Neurological: She is alert and oriented to person, place, and time.  Skin: Skin is warm and dry. Capillary refill takes less than 2 seconds.  Psychiatric: She has a normal mood and affect. Her behavior is normal.  Nursing note and vitals reviewed.    Musculoskeletal Exam: C-spine thoracic lumbar spine good range of motion.  Shoulder joints elbow joints are good range of motion.  She has some synovial thickening but no active synovitis in her hands.  She is not having much discomfort in her trigger finger and able to extend and flex her hands without much discomfort.  She has bilateral total knee replacement her left knee joint was warm to touch.  Ankle joints MTPs PIPs with good range of motion with no swelling.  CDAI Exam: CDAI Homunculus Exam:   Tenderness:  LLE: tibiofemoral  Joint Counts:  CDAI Tender Joint count: 1 CDAI Swollen Joint count: 0  Global Assessments:  Patient Global Assessment: 5 Provider Global Assessment: 5  CDAI Calculated Score: 11    Investigation: No additional findings.TB Gold: 01/09/2017 Negative  CBC Latest Ref Rng & Units 11/15/2017 06/01/2017 01/09/2017  WBC 3.8 - 10.8 Thousand/uL 7.2 6.5 6.7  Hemoglobin 11.7 - 15.5 g/dL 11.5(L) 12.4 11.2(L)  Hematocrit 35.0 - 45.0 %  36.0 38.1 35.0  Platelets 140 - 400 Thousand/uL 227 199 248   CMP Latest Ref Rng & Units 11/15/2017 06/01/2017 01/09/2017  Glucose 65 - 99 mg/dL 74 85 646(O)  BUN 7 - 25 mg/dL 8 11 14   Creatinine 0.60 - 0.88 mg/dL ) 0.32(Z 2.24)  Sodium 135 - 146 mmol/L 141 141 139  Potassium 3.5 - 5.3 mmol/L 4.5 4.7 4.8  Chloride 98 - 110 mmol/L 105 102 102  CO2 20 - 32 mmol/L 29 33(H) 26  Calcium 8.6 - 10.4 mg/dL 9.0 9.3 9.4  Total Protein 6.1 - 8.1 g/dL 6.5 7.1 6.6  Total Bilirubin 0.2 - 1.2 mg/dL 0.4 0.4 0.6  Alkaline Phos 33 - 130 U/L - - 65  AST 10 - 35 U/L 13 14 14   ALT 6 - 29 U/L 7 8 9     Imaging: No results found.  Speciality Comments: Tb Gold: 01/09/17 Neg    Procedures:  No procedures performed Allergies: Penicillins   Assessment / Plan:     Visit Diagnoses: Rheumatoid arthritis involving multiple sites with positive rheumatoid factor (HCC)-patient had no synovitis in her hands today.  She has some warmth in her left knee joint which is been replaced.  In my opinion her rheumatoid arthritis is very well controlled with Enbrel and methotrexate combination.  She had upper respiratory tract infection currently have advised her to hold Enbrel for the next few days and then resume once the infection clears up.  If infection gets worse she should see her PCP.  High risk medication use - Enbrel 50 mg sq  qweek, MTX 4tabs po qwk, Folic acid 1mg  po qd.  Her labs are stable we will continue to monitor labs every 3 months.  Trigger ring finger of left hand-improved.  Trigger middle finger of left hand-improved.  H/O total knee replacement, bilateral-she had bilateral total knee replacement in some other state.  She has been having increased pain in her left knee joint which is swollen.  She wants to establish with an orthopedic surgeon here.  We will refer her to orthopedics.  DDD (degenerative disc disease), lumbar-she is currently not having much discomfort.  Other medical problems are  listed as follows:  History of glaucoma/ patient is legally blind  History of atherosclerosis  History of gastroesophageal reflux (GERD)  History of diverticulosis  History of hypothyroidism  History of memory loss  history of asthmatic bronchitis    Orders: No orders of the defined types were placed in this encounter.  No orders of the defined types were placed in this encounter.   Face-to-face time spent with patient was 30 minutes.> 50% of time was spent in counseling and coordination of care.  Follow-Up Instructions: Return in about 5 months (around 05/21/2018) for Rheumatoid arthritis, Osteoarthritis.   Pollyann Savoy, MD  Note - This record has been created using Animal nutritionist.  Chart creation errors have been sought, but may not always  have been located. Such creation errors do not reflect on  the standard of medical care.

## 2017-12-19 ENCOUNTER — Encounter: Payer: Self-pay | Admitting: Rheumatology

## 2017-12-19 ENCOUNTER — Ambulatory Visit (INDEPENDENT_AMBULATORY_CARE_PROVIDER_SITE_OTHER): Payer: Medicare Other | Admitting: Rheumatology

## 2017-12-19 VITALS — BP 144/60 | HR 65 | Resp 16 | Ht 61.5 in | Wt 117.0 lb

## 2017-12-19 DIAGNOSIS — Z8719 Personal history of other diseases of the digestive system: Secondary | ICD-10-CM | POA: Diagnosis not present

## 2017-12-19 DIAGNOSIS — Z96653 Presence of artificial knee joint, bilateral: Secondary | ICD-10-CM

## 2017-12-19 DIAGNOSIS — M5136 Other intervertebral disc degeneration, lumbar region: Secondary | ICD-10-CM

## 2017-12-19 DIAGNOSIS — M65332 Trigger finger, left middle finger: Secondary | ICD-10-CM

## 2017-12-19 DIAGNOSIS — Z8679 Personal history of other diseases of the circulatory system: Secondary | ICD-10-CM

## 2017-12-19 DIAGNOSIS — M0579 Rheumatoid arthritis with rheumatoid factor of multiple sites without organ or systems involvement: Secondary | ICD-10-CM

## 2017-12-19 DIAGNOSIS — Z8639 Personal history of other endocrine, nutritional and metabolic disease: Secondary | ICD-10-CM

## 2017-12-19 DIAGNOSIS — M65342 Trigger finger, left ring finger: Secondary | ICD-10-CM

## 2017-12-19 DIAGNOSIS — Z87898 Personal history of other specified conditions: Secondary | ICD-10-CM

## 2017-12-19 DIAGNOSIS — Z8709 Personal history of other diseases of the respiratory system: Secondary | ICD-10-CM | POA: Diagnosis not present

## 2017-12-19 DIAGNOSIS — Z8669 Personal history of other diseases of the nervous system and sense organs: Secondary | ICD-10-CM

## 2017-12-19 DIAGNOSIS — Z79899 Other long term (current) drug therapy: Secondary | ICD-10-CM | POA: Diagnosis not present

## 2017-12-19 NOTE — Patient Instructions (Signed)
Standing Labs We placed an order today for your standing lab work.    Please come back and get your standing labs in July and every 3 months   We have open lab Monday through Friday from 8:30-11:30 AM and 1:30-4:00 PM  at the office of Dr. Karnell Vanderloop.   You may experience shorter wait times on Monday and Friday afternoons. The office is located at [address], [address], [place], [place] [address] No appointment is necessary.   Labs are drawn by [facility].  You may receive a bill from [facility] for your lab work. If you have any questions regarding directions or hours of operation,  please call [phone].    

## 2017-12-21 ENCOUNTER — Other Ambulatory Visit (HOSPITAL_COMMUNITY): Payer: Self-pay | Admitting: Nurse Practitioner

## 2017-12-21 DIAGNOSIS — R1310 Dysphagia, unspecified: Secondary | ICD-10-CM

## 2018-01-02 ENCOUNTER — Other Ambulatory Visit: Payer: Self-pay | Admitting: Rheumatology

## 2018-01-02 NOTE — Telephone Encounter (Signed)
Last Visit: 12/19/17 Next Visit: 05/22/18 Labs: 11/15/17 GFR is low but is stable  Okay to refill per Dr. Corliss Skains

## 2018-01-03 ENCOUNTER — Ambulatory Visit (HOSPITAL_COMMUNITY)
Admission: RE | Admit: 2018-01-03 | Discharge: 2018-01-03 | Disposition: A | Discharge status: Home / Self Care | Payer: Medicare Other | Source: Ambulatory Visit | Attending: Nurse Practitioner | Admitting: Nurse Practitioner

## 2018-01-03 DIAGNOSIS — K224 Dyskinesia of esophagus: Secondary | ICD-10-CM | POA: Diagnosis not present

## 2018-01-03 DIAGNOSIS — K222 Esophageal obstruction: Secondary | ICD-10-CM | POA: Diagnosis not present

## 2018-01-03 DIAGNOSIS — R1310 Dysphagia, unspecified: Secondary | ICD-10-CM | POA: Diagnosis not present

## 2018-01-08 ENCOUNTER — Encounter (INDEPENDENT_AMBULATORY_CARE_PROVIDER_SITE_OTHER): Payer: Self-pay | Admitting: Orthopaedic Surgery

## 2018-01-08 ENCOUNTER — Ambulatory Visit (INDEPENDENT_AMBULATORY_CARE_PROVIDER_SITE_OTHER): Payer: Medicare Other | Admitting: Orthopaedic Surgery

## 2018-01-08 ENCOUNTER — Ambulatory Visit (INDEPENDENT_AMBULATORY_CARE_PROVIDER_SITE_OTHER): Payer: Self-pay

## 2018-01-08 VITALS — BP 154/82 | HR 57 | Resp 18 | Ht 62.0 in | Wt 119.0 lb

## 2018-01-08 DIAGNOSIS — M25562 Pain in left knee: Secondary | ICD-10-CM | POA: Diagnosis not present

## 2018-01-08 DIAGNOSIS — G8929 Other chronic pain: Secondary | ICD-10-CM | POA: Diagnosis not present

## 2018-01-08 NOTE — Progress Notes (Signed)
Office Visit Note   Patient: Jordan [name]           Date of Birth: Sep 17, 1933           MRN: [id number] Visit Date: 01/08/2018              Requested by: Arnette Felts [address] [place], [place] [address] PCP: Arnette Felts   Assessment & Plan: Visit Diagnoses:  1. Chronic pain of left knee     Plan: Painful left total knee replacement for approximately 4 to 6 weeks.  No fever or chills.  No obvious abnormality by exam or films.  We will try a pullover knee support and a course of physical therapy.  Follow-up in several weeks if still symptomatic Follow-Up Instructions: Return if symptoms worsen or fail to improve.   Orders:  Orders Placed This Encounter  Procedures  . XR KNEE 3 VIEW LEFT  . Ambulatory referral to Physical Therapy   No orders of the defined types were placed in this encounter.     Procedures: No procedures performed   Clinical Data: No additional findings.   Subjective: Chief Complaint  Patient presents with  . Left Knee - Pain  . Knee Pain    Left knee pain x 6 weeks, no injury, not diabetic, Total knee replacement surgery in [place] 1990's, difficulty walking at times, swelling, Tylenol helps some  Mrs. Trampe relates about a 4 to 6-week history of left knee pain.  Her past history is significant that she had a knee replacement approximately 20 years ago while living in [place].  She denies any history of injury or trauma but relates that she is a poor historian.  Niece assisted with her history by phone from [place].  He has been experiencing some pain just above her left knee with occasional swelling.  She is not experiencing any sensation of her knee giving way or instability.  No hip thigh or calf pain.  He denies any back pain  HPI  Review of Systems  Constitutional: Positive for activity change and fatigue.  HENT: Negative for trouble swallowing.   Eyes: Positive for pain.  Respiratory: Positive for  shortness of breath.   Cardiovascular: Positive for leg swelling.  Gastrointestinal: Negative for constipation.  Endocrine: Positive for cold intolerance.  Genitourinary: Negative for difficulty urinating.  Musculoskeletal: Positive for gait problem and joint swelling.  Skin: Negative for rash.  Allergic/Immunologic: Negative for food allergies.  Neurological: Positive for weakness and numbness.  Hematological: Bruises/bleeds easily.  Psychiatric/Behavioral: Positive for sleep disturbance.     Objective: Vital Signs: BP (!) 154/82 (BP Location: Right Arm, Patient Position: Sitting, Cuff Size: Normal)   Pulse (!) 57   Resp 18   Ht 5\' 2"  (1.575 m)   Wt 119 lb (54 kg)   BMI 21.77 kg/m   Physical Exam  Constitutional: She is oriented to person, place, and time. She appears well-developed and well-nourished.  HENT:  Mouth/Throat: Oropharynx is clear and moist.  Pulmonary/Chest: Effort normal.  Neurological: She is alert and oriented to person, place, and time.  Skin: Skin is warm and dry.  Psychiatric: She has a normal mood and affect. Her behavior is normal.    Ortho Exam awake alert and oriented x3.  Relates that she is "nearly blind".  No shortness of breath or chest pain.  Right leg raise negative bilaterally.  Painless range of motion left hip.  Seems to have significant thigh atrophy bilaterally.  No  left knee effusion.  Does open up a little bit medially and laterally with a valgus and varus stress.  Mildly positive anterior drawer sign.  Able to fully extend her knee and flex to 107 degrees with a goniometer.  Minimal ankle edema.  Good capillary refill to toes.  Motor exam intact  Specialty Comments:  No specialty comments available.  Imaging: Xr Knee 3 View Left  Result Date: 01/08/2018 Films of the left knee were obtained in 3 projections standing.  There is a total knee replacement in good position.  It appears that the tibia has been cemented in the femur is  noncemented.  Joint spaces are equal.  No evidence of loosening on either AP or lateral film.  Some mild calcification in the popliteal artery.  Does not appear that the patella has been replaced but tracks in the midline.  No obvious acute changes    PMFS History: Patient Active Problem List   Diagnosis Date Noted  . Memory loss 04/12/2017  . Depression 04/12/2017  . Rheumatoid arthritis involving multiple sites with positive rheumatoid factor (HCC) 08/17/2016  . High risk medication use 08/17/2016  . H/O total knee replacement, bilateral 08/17/2016  . Spondylosis of lumbar region without myelopathy or radiculopathy 08/17/2016  . History of glaucoma/ patient is legally blind 08/17/2016  . History of gastroesophageal reflux (GERD) 08/17/2016  . History of diverticulosis 08/17/2016  . History of hypothyroidism 08/17/2016  . history of asthmatic bronchitis 08/17/2016  . History of memory loss 08/17/2016  . History of atherosclerosis 08/17/2016   Past Medical History:  Diagnosis Date  . Depression   . GERD (gastroesophageal reflux disease)   . High cholesterol   . Hypertension   . Stroke [facility])     History reviewed. No pertinent family history.  Past Surgical History:  Procedure Laterality Date  . ABDOMINAL HYSTERECTOMY    . REPLACEMENT TOTAL KNEE BILATERAL Bilateral   . WRIST SURGERY Left    Social History   Occupational History  . Occupation: Retired  Tobacco Use  . Smoking status: Former Smoker    Packs/day: 0.25    Years: 13.00    Pack years: 3.25    Types: Cigarettes    Last attempt to quit: 08/18/2006    Years since quitting: 11.4  . Smokeless tobacco: Never Used  Substance and Sexual Activity  . Alcohol use: No  . Drug use: No  . Sexual activity: Not on file

## 2018-01-16 ENCOUNTER — Encounter: Payer: Self-pay | Admitting: Physical Therapy

## 2018-01-16 ENCOUNTER — Ambulatory Visit: Payer: Medicare Other | Attending: Orthopaedic Surgery | Admitting: Physical Therapy

## 2018-01-16 DIAGNOSIS — M25562 Pain in left knee: Secondary | ICD-10-CM | POA: Diagnosis not present

## 2018-01-16 DIAGNOSIS — G8929 Other chronic pain: Secondary | ICD-10-CM | POA: Insufficient documentation

## 2018-01-16 DIAGNOSIS — R2689 Other abnormalities of gait and mobility: Secondary | ICD-10-CM | POA: Insufficient documentation

## 2018-01-16 DIAGNOSIS — M25662 Stiffness of left knee, not elsewhere classified: Secondary | ICD-10-CM | POA: Diagnosis not present

## 2018-01-16 DIAGNOSIS — M6281 Muscle weakness (generalized): Secondary | ICD-10-CM | POA: Insufficient documentation

## 2018-01-16 NOTE — Therapy (Addendum)
[facility] Outpatient Rehabilitation [facility] [address] [place], [place], [address] Phone: [phone]   Fax:  [phone]  Physical Therapy Evaluation  Patient Details  Name: Jordan [name] MRN: [id number] Date of Birth: 1933/08/06 Referring Provider: Valeria Batman, MD   Encounter Date: 01/16/2018  PT End of Session - 01/16/18 1025    Visit Number  1    Number of Visits  13    Date for PT Re-Evaluation  02/27/18    PT Start Time  0930    PT Stop Time  1025    PT Time Calculation (min)  55 min    Activity Tolerance  Patient tolerated treatment well    Behavior During Therapy  [facility] for tasks assessed/performed       Past Medical History:  Diagnosis Date  . Allergy   . Anxiety    per pt's daughter  . Arthritis   . Asthma    pt reported  . Depression   . GERD (gastroesophageal reflux disease)   . Hearing deficit L  . High cholesterol   . Hypertension   . Osteoporosis   . Stroke [facility])     Past Surgical History:  Procedure Laterality Date  . ABDOMINAL HYSTERECTOMY    . REPLACEMENT TOTAL KNEE BILATERAL Bilateral   . WRIST SURGERY Left     There were no vitals filed for this visit.   Subjective Assessment - 01/16/18 0945    Subjective  Pt reports burning sensation that travels from knee into thigh. She has difficulty walking and sleeping. The pain is aching and continous. Pain has been bothering for her for 3-4 months. She states pain is getting worse since the onset. She reports numbness/tingling in L foot and ankle.     Patient is accompained by:  -- caretaker, daughter on phone during eval    Pertinent History  h/o TKA    Limitations  Walking;Standing;Lifting;Sitting;House hold activities    How long can you sit comfortably?  15 min    How long can you stand comfortably?  a couple minutes    How long can you walk comfortably?  5 minutes straight point cane    Patient Stated Goals  get around better, be independent    Currently in  Pain?  Yes unsure of last time pain medication was taken    Pain Score  5     Pain Location  Knee    Pain Orientation  Left;Lateral    Pain Descriptors / Indicators  Burning;Aching;Throbbing    Pain Type  Chronic pain    Pain Radiating Towards  distal thigh    Pain Onset  More than a month ago    Pain Frequency  Constant    Aggravating Factors   all activity    Pain Relieving Factors  laying down, pain medication, knee brace         [facility] PT Assessment - 01/16/18 0001      Assessment   Medical Diagnosis  chronic pain of L knee    Referring Provider  Valeria Batman, MD    Onset Date/Surgical Date  -- 3-4 months ago    Hand Dominance  Right    Next MD Visit  -- unsure    Prior Therapy  yes      Precautions   Precautions  None      Restrictions   Weight Bearing Restrictions  No      Balance Screen   Has the patient fallen in  the past 6 months  Yes    How many times?  2    Has the patient had a decrease in activity level because of a fear of falling?   Yes    Is the patient reluctant to leave their home because of a fear of falling?   Yes      Home Nurse, mental health  Private residence    Living Arrangements  Other (Comment);Other relatives niece    Available Help at Discharge  -- niece but doesn't get home until late    Type of Home  House    Home Access  Stairs to enter    Entrance Stairs-Number of Steps  4    Entrance Stairs-Rails  None    Home Layout  One level    Home Equipment  [place] - single point;Walker - 2 wheels;Toilet riser      Prior Function   Level of Independence  Needs assistance with ADLs    Dressing  Moderate    Grooming  Moderate    Vocation  Retired      Copy Status  Within Functional Limits for tasks assessed      Observation/Other Assessments   Focus on Therapeutic Outcomes (FOTO)   90% limited expected 69% limited      Posture/Postural Control   Posture/Postural Control  Postural limitations     Postural Limitations  Rounded Shoulders;Forward head      AROM   Right Knee Extension  0    Right Knee Flexion  111    Left Knee Extension  0    Left Knee Flexion  55      PROM   Right Knee Extension  0    Right Knee Flexion  124    Left Knee Extension  0    Left Knee Flexion  103      Strength   Right Knee Flexion  4-/5    Right Knee Extension  4-/5    Left Knee Flexion  3+/5    Left Knee Extension  3+/5      Palpation   Patella mobility  limited and painful, especially on lateral aspect    Palpation comment  TTP on lateral aspect of patella, on medial & lateral hamstring tendons      Ambulation/Gait   Gait Pattern  Decreased stride length;Step-to pattern;Antalgic;Narrow base of support;Decreased hip/knee flexion - left straight point cane                 Objective measurements completed on examination: See above findings.      [facility] Adult PT Treatment/Exercise - 01/16/18 0001      Manual Therapy   Manual Therapy  Taping    McConnell  Patellar taping with medial pull             PT Education - 01/16/18 1030    Education Details  examination findings, POC, HEP, patellar tracking, McConnell taping    Person(s) Educated  Patient;Caregiver(s)    Methods  Explanation;Verbal cues;Tactile cues;Handout    Comprehension  Verbalized understanding       PT Short Term Goals - 01/16/18 1218      PT SHORT TERM GOAL #1   Title  pt will be I with HEP    Time  3    Period  Weeks    Status  New    Target Date  02/06/18      PT SHORT TERM GOAL #2  Title  Pt will verbalize/demo improved posture to decrease pain and improve functional mobility    Time  3    Period  Weeks    Status  New    Target Date  02/06/18      PT SHORT TERM GOAL #3   Title  Pt will report decreased pain in L knee to </= 2/10 to improve function and QOL    Time  3    Period  Weeks    Status  New    Target Date  02/06/18        PT Long Term Goals - 01/16/18 1219      PT LONG  TERM GOAL #1   Title  Pt will increase L knee flexion AROM >/= 110 degrees to improve functional mobility and decrease pain to </= 2/10    Time  6    Period  Weeks    Status  New    Target Date  02/27/18      PT LONG TERM GOAL #2   Title  Pt will be able to stand >/= 20 minutes with LRAD in order to complete requirements for ADLs    Time  6    Period  Weeks    Status  New    Target Date  02/27/18      PT LONG TERM GOAL #3   Title  Pt will increase knee strength to >/= 4/5 to improve functional mobility and decrease pain </= 2/10     Time  6    Period  Weeks    Status  New    Target Date  02/27/18      PT LONG TERM GOAL #4   Title  Pt will improve FOTO score to </= 69% limited to improve function and QOL    Time  6    Period  Weeks    Status  New    Target Date  02/27/18      PT LONG TERM GOAL #5   Title  Pt will be I with HEP by last visit to improve function and mobility    Time  6    Period  Weeks    Status  New    Target Date  02/27/18             Plan - 01/16/18 1045    Clinical Impression Statement  Pt is a 84 yo female who presents to OPPT with cc of L knee pain. She has had a L TKA in the late 1990s. Pt also has vision problems and requires a caretaker to guide her. She ambulates with a straight point cane. Upon evaluation, pt presents with impaired posture, decreased L knee strength and AROM. She is tender to palpation over the lateral aspect of her L patella and has decreased patellar mobility. Based on assessment, pt has abnormal patellar tracking and would benefit from OPPT services to address pain, posture, ROM, strength, and functional mobility.     History and Personal Factors relevant to plan of care:  age, blindness, multiple comorbidities, h/o L TKA and pain worsening    Clinical Presentation  Unstable    Clinical Presentation due to:  increased pain, decreased ROM and strength, impaired mobility    Clinical Decision Making  High    Rehab Potential   Good    PT Frequency  2x / week    PT Duration  6 weeks    PT Treatment/Interventions  ADLs/Self Care Home Management;Cryotherapy;Lobbyist  Stimulation;Moist Heat;Ultrasound;Gait training;Stair training;Functional mobility training;Therapeutic activities;Therapeutic exercise;Balance training;Neuromuscular re-education;Patient/family education;Manual techniques;Passive range of motion;Dry needling;Taping    PT Next Visit Plan  Pt has limited vision, HEP update/review, taping prn, assess hip strength, hip and knee strengthening, manual therapy prn    PT Home Exercise Plan  quad sets, heel slides, SAQs, sidelying clamshell    Consulted and Agree with Plan of Care  Patient;Family member/caregiver       Patient will benefit from skilled therapeutic intervention in order to improve the following deficits and impairments:  Abnormal gait, Improper body mechanics, Pain, Decreased mobility, Postural dysfunction, Decreased endurance, Decreased activity tolerance, Decreased range of motion, Decreased strength, Hypomobility, Decreased balance, Difficulty walking, Impaired flexibility, Impaired vision/preception  Visit Diagnosis: Chronic pain of left knee  Other abnormalities of gait and mobility  Muscle weakness (generalized)  Decreased ROM of left knee     Problem List Patient Active Problem List   Diagnosis Date Noted  . Memory loss 04/12/2017  . Depression 04/12/2017  . Rheumatoid arthritis involving multiple sites with positive rheumatoid factor (HCC) 08/17/2016  . High risk medication use 08/17/2016  . H/O total knee replacement, bilateral 08/17/2016  . Spondylosis of lumbar region without myelopathy or radiculopathy 08/17/2016  . History of glaucoma/ patient is legally blind 08/17/2016  . History of gastroesophageal reflux (GERD) 08/17/2016  . History of diverticulosis 08/17/2016  . History of hypothyroidism 08/17/2016  . history of asthmatic bronchitis 08/17/2016  . History of memory  loss 08/17/2016  . History of atherosclerosis 08/17/2016   Laurelyn Sickle, SPT 01/16/18 1:29 PM   [facility] Outpatient Rehabilitation [facility] [address] [place], [place], [address] Phone: [phone]   Fax:  [phone]  Name: Janeece Graefe MRN: [id number] Date of Birth: June 25, 1934

## 2018-01-17 ENCOUNTER — Other Ambulatory Visit: Payer: Self-pay | Admitting: Rheumatology

## 2018-01-17 NOTE — Telephone Encounter (Signed)
Last Visit: 12/19/17 Next Visit: 05/22/18  Okay to refill per Dr. Deveshwar 

## 2018-01-18 ENCOUNTER — Ambulatory Visit: Payer: Medicare Other | Admitting: Physical Therapy

## 2018-01-18 DIAGNOSIS — M25562 Pain in left knee: Secondary | ICD-10-CM | POA: Diagnosis not present

## 2018-01-18 DIAGNOSIS — M25662 Stiffness of left knee, not elsewhere classified: Secondary | ICD-10-CM

## 2018-01-18 DIAGNOSIS — R2689 Other abnormalities of gait and mobility: Secondary | ICD-10-CM | POA: Diagnosis not present

## 2018-01-18 DIAGNOSIS — M6281 Muscle weakness (generalized): Secondary | ICD-10-CM

## 2018-01-18 DIAGNOSIS — G8929 Other chronic pain: Secondary | ICD-10-CM

## 2018-01-18 NOTE — Therapy (Addendum)
[facility] Outpatient Rehabilitation [address] [facility] [address] [place], [place], [address] Phone: [phone]   Fax:  [phone]  Physical Therapy Treatment  Patient Details  Name: Jordan [name] MRN: [id number] Date of Birth: 1933-08-18 Referring Provider: Valeria Batman, MD   Encounter Date: 01/18/2018  PT End of Session - 01/18/18 1529    Visit Number  2    Number of Visits  13    Date for PT Re-Evaluation  02/27/18    PT Start Time  1425 pt arrived 10 min late    PT Stop Time  1500    PT Time Calculation (min)  35 min    Activity Tolerance  Patient tolerated treatment well    Behavior During Therapy  [facility] for tasks assessed/performed       Past Medical History:  Diagnosis Date  . Allergy   . Anxiety    per pt's daughter  . Arthritis   . Asthma    pt reported  . Depression   . GERD (gastroesophageal reflux disease)   . Hearing deficit L  . High cholesterol   . Hypertension   . Osteoporosis   . Stroke [facility])     Past Surgical History:  Procedure Laterality Date  . ABDOMINAL HYSTERECTOMY    . REPLACEMENT TOTAL KNEE BILATERAL Bilateral   . WRIST SURGERY Left     There were no vitals filed for this visit.  Subjective Assessment - 01/18/18 1430    Subjective  "I am hurting today. I have been doing my exercises at home."    Pain Score  6     Pain Location  Knee    Pain Orientation  Left    Pain Descriptors / Indicators  Nagging    Pain Type  Chronic pain    Pain Onset  More than a month ago         [facility] PT Assessment - 01/18/18 0001      ROM / Strength   AROM / PROM / Strength  Strength      Strength   Right Hip Flexion  3+/5    Right Hip Extension  3/5    Right Hip ABduction  3/5    Left Hip Flexion  3+/5    Left Hip Extension  3/5    Left Hip ABduction  3-/5                   [facility] Adult PT Treatment/Exercise - 01/18/18 0001      Knee/Hip Exercises: Seated   Sit to Sand  5 reps;without UE support Cues for  controlled descent      Knee/Hip Exercises: Supine   Quad Sets  AROM;Strengthening;Left;2 sets x 5 reps    Short Arc Quad Sets  AROM;AAROM;Strengthening;Left;1 set;10 reps AAROM final 5 reps. With ball squeeze    Heel Slides  AROM;Strengthening;Left;5 reps      Knee/Hip Exercises: Sidelying   Clams  x 10 reps. Cues for correct technique      Manual Therapy   Manual Therapy  Taping    McConnell  patellar taping with medial pull             PT Education - 01/18/18 1528    Education Details  HEP review, patellar tracking    Person(s) Educated  Patient;Caregiver(s)    Methods  Explanation;Verbal cues    Comprehension  Verbalized understanding       PT Short Term Goals - 01/16/18 1218  PT SHORT TERM GOAL #1   Title  pt will be I with HEP    Time  3    Period  Weeks    Status  New    Target Date  02/06/18      PT SHORT TERM GOAL #2   Title  Pt will verbalize/demo improved posture to decrease pain and improve functional mobility    Time  3    Period  Weeks    Status  New    Target Date  02/06/18      PT SHORT TERM GOAL #3   Title  Pt will report decreased pain in L knee to </= 2/10 to improve function and QOL    Time  3    Period  Weeks    Status  New    Target Date  02/06/18        PT Long Term Goals - 01/16/18 1219      PT LONG TERM GOAL #1   Title  Pt will increase L knee flexion AROM >/= 110 degrees to improve functional mobility and decrease pain to </= 2/10    Time  6    Period  Weeks    Status  New    Target Date  02/27/18      PT LONG TERM GOAL #2   Title  Pt will be able to stand >/= 20 minutes with LRAD in order to complete requirements for ADLs    Time  6    Period  Weeks    Status  New    Target Date  02/27/18      PT LONG TERM GOAL #3   Title  Pt will increase knee strength to >/= 4/5 to improve functional mobility and decrease pain </= 2/10     Time  6    Period  Weeks    Status  New    Target Date  02/27/18      PT LONG TERM  GOAL #4   Title  Pt will improve FOTO score to </= 69% limited to improve function and QOL    Time  6    Period  Weeks    Status  New    Target Date  02/27/18      PT LONG TERM GOAL #5   Title  Pt will be I with HEP by last visit to improve function and mobility    Time  6    Period  Weeks    Status  New    Target Date  02/27/18            Plan - 01/18/18 1531    Clinical Impression Statement  Pt states her knee is bothering her today. Pt states the McConnell taping for her patella helped her pain for the rest of the day. Hip strength assessment reveal weakness of the hip flexors, extensors, and abductors. She continues to be tender to palpation on the lateral aspect of her patella and is hesitant to move the knee. Treatment today focused on review of HEP and strengthening of hips and quads. Reapplied McConnell patellar taping. Pt reports the tape relieves her pain. She states her pain is less at the end of the session as compared to the beginning (4-5/10).      PT Treatment/Interventions  ADLs/Self Care Home Management;Cryotherapy;Electrical Stimulation;Moist Heat;Ultrasound;Gait training;Stair training;Functional mobility training;Therapeutic activities;Therapeutic exercise;Balance training;Neuromuscular re-education;Patient/family education;Manual techniques;Passive range of motion;Dry needling;Taping    PT Next Visit Plan  Pt has  limited vision, HEP update/review, taping prn, hip and knee strengthening, manual therapy prn, balance, hamstring stretching    PT Home Exercise Plan  quad sets, heel slides, SAQs, sidelying clamshell, sit to stand from elevated surface    Consulted and Agree with Plan of Care  Patient;Family member/caregiver       Patient will benefit from skilled therapeutic intervention in order to improve the following deficits and impairments:  Abnormal gait, Improper body mechanics, Pain, Decreased mobility, Postural dysfunction, Decreased endurance, Decreased  activity tolerance, Decreased range of motion, Decreased strength, Hypomobility, Decreased balance, Difficulty walking, Impaired flexibility, Impaired vision/preception  Visit Diagnosis: Chronic pain of left knee  Other abnormalities of gait and mobility  Muscle weakness (generalized)  Decreased ROM of left knee     Problem List Patient Active Problem List   Diagnosis Date Noted  . Memory loss 04/12/2017  . Depression 04/12/2017  . Rheumatoid arthritis involving multiple sites with positive rheumatoid factor (HCC) 08/17/2016  . High risk medication use 08/17/2016  . H/O total knee replacement, bilateral 08/17/2016  . Spondylosis of lumbar region without myelopathy or radiculopathy 08/17/2016  . History of glaucoma/ patient is legally blind 08/17/2016  . History of gastroesophageal reflux (GERD) 08/17/2016  . History of diverticulosis 08/17/2016  . History of hypothyroidism 08/17/2016  . history of asthmatic bronchitis 08/17/2016  . History of memory loss 08/17/2016  . History of atherosclerosis 08/17/2016   Laurelyn Sickle, SPT 01/18/18 4:18 PM   [facility] Outpatient Rehabilitation [facility] [address] [place], [place], [address] Phone: [phone]   Fax:  [phone]  Name: Jordan [name] MRN: [id number] Date of Birth: Mar 05, 1934

## 2018-01-30 ENCOUNTER — Ambulatory Visit: Payer: Medicare Other | Attending: Orthopaedic Surgery | Admitting: Physical Therapy

## 2018-01-30 DIAGNOSIS — M25562 Pain in left knee: Secondary | ICD-10-CM | POA: Insufficient documentation

## 2018-01-30 DIAGNOSIS — M6281 Muscle weakness (generalized): Secondary | ICD-10-CM | POA: Insufficient documentation

## 2018-01-30 DIAGNOSIS — R2689 Other abnormalities of gait and mobility: Secondary | ICD-10-CM | POA: Insufficient documentation

## 2018-01-30 DIAGNOSIS — M25662 Stiffness of left knee, not elsewhere classified: Secondary | ICD-10-CM | POA: Insufficient documentation

## 2018-01-30 DIAGNOSIS — G8929 Other chronic pain: Secondary | ICD-10-CM | POA: Insufficient documentation

## 2018-02-05 ENCOUNTER — Ambulatory Visit: Payer: Medicare Other | Admitting: Physical Therapy

## 2018-02-07 ENCOUNTER — Ambulatory Visit: Payer: Medicare Other | Admitting: Physical Therapy

## 2018-02-07 DIAGNOSIS — M25562 Pain in left knee: Secondary | ICD-10-CM | POA: Diagnosis not present

## 2018-02-07 DIAGNOSIS — G8929 Other chronic pain: Secondary | ICD-10-CM

## 2018-02-07 DIAGNOSIS — R2689 Other abnormalities of gait and mobility: Secondary | ICD-10-CM | POA: Diagnosis not present

## 2018-02-07 DIAGNOSIS — M25662 Stiffness of left knee, not elsewhere classified: Secondary | ICD-10-CM

## 2018-02-07 DIAGNOSIS — M6281 Muscle weakness (generalized): Secondary | ICD-10-CM | POA: Diagnosis not present

## 2018-02-07 NOTE — Therapy (Signed)
[facility] Rehabilitation [facility] [address] [place], [place], [address] Phone: [phone]   Fax:  [phone]  Physical Therapy Treatment  Patient Details  Name: Jordan [name] MRN: [id number] Date of Birth: Nov 30, 1933 Referring Provider: Valeria Batman, MD   Encounter Date: 02/07/2018  PT End of Session - 02/07/18 1005    Visit Number  3    Number of Visits  13    Date for PT Re-Evaluation  02/27/18    PT Start Time  0946 pt 15 min late    PT Stop Time  1016    PT Time Calculation (min)  30 min    Activity Tolerance  Patient tolerated treatment well;Patient limited by pain    Behavior During Therapy  [facility] for tasks assessed/performed       Past Medical History:  Diagnosis Date  . Allergy   . Anxiety    per pt's daughter  . Arthritis   . Asthma    pt reported  . Depression   . GERD (gastroesophageal reflux disease)   . Hearing deficit L  . High cholesterol   . Hypertension   . Osteoporosis   . Stroke [facility])     Past Surgical History:  Procedure Laterality Date  . ABDOMINAL HYSTERECTOMY    . REPLACEMENT TOTAL KNEE BILATERAL Bilateral   . WRIST SURGERY Left     There were no vitals filed for this visit.  Subjective Assessment - 02/07/18 0948    Subjective  "I feel fine today except my knee hurts. My knee swelled this morning. I have been doing my exercises at home."    Currently in Pain?  Yes    Pain Score  7     Pain Location  Knee    Pain Orientation  Left    Pain Descriptors / Indicators  Aching    Pain Type  Chronic pain    Pain Onset  More than a month ago    Pain Frequency  Constant                       [facility] Adult PT Treatment/Exercise - 02/07/18 0001      Knee/Hip Exercises: Stretches   Passive Hamstring Stretch  2 reps;30 seconds;Left    Hip Flexor Stretch  1 rep;30 seconds;Left      Knee/Hip Exercises: Supine   Short Arc Quad Sets  AROM;2 sets;10 reps    Straight Leg Raises  2 sets;5  reps;AAROM with quad set      Knee/Hip Exercises: Sidelying   Clams  x 10 reps. Cues for hips rolled forward      Manual Therapy   McConnell  patellar taping with medial pull             PT Education - 02/07/18 1017    Education Details  HEP review and update    Person(s) Educated  Patient;Caregiver(s)    Methods  Explanation;Handout    Comprehension  Verbalized understanding       PT Short Term Goals - 01/16/18 1218      PT SHORT TERM GOAL #1   Title  pt will be I with HEP    Time  3    Period  Weeks    Status  New    Target Date  02/06/18      PT SHORT TERM GOAL #2   Title  Pt will verbalize/demo improved posture to decrease pain and improve functional mobility  Time  3    Period  Weeks    Status  New    Target Date  02/06/18      PT SHORT TERM GOAL #3   Title  Pt will report decreased pain in L knee to </= 2/10 to improve function and QOL    Time  3    Period  Weeks    Status  New    Target Date  02/06/18        PT Long Term Goals - 01/16/18 1219      PT LONG TERM GOAL #1   Title  Pt will increase L knee flexion AROM >/= 110 degrees to improve functional mobility and decrease pain to </= 2/10    Time  6    Period  Weeks    Status  New    Target Date  02/27/18      PT LONG TERM GOAL #2   Title  Pt will be able to stand >/= 20 minutes with LRAD in order to complete requirements for ADLs    Time  6    Period  Weeks    Status  New    Target Date  02/27/18      PT LONG TERM GOAL #3   Title  Pt will increase knee strength to >/= 4/5 to improve functional mobility and decrease pain </= 2/10     Time  6    Period  Weeks    Status  New    Target Date  02/27/18      PT LONG TERM GOAL #4   Title  Pt will improve FOTO score to </= 69% limited to improve function and QOL    Time  6    Period  Weeks    Status  New    Target Date  02/27/18      PT LONG TERM GOAL #5   Title  Pt will be I with HEP by last visit to improve function and mobility     Time  6    Period  Weeks    Status  New    Target Date  02/27/18            Plan - 02/07/18 1018    Clinical Impression Statement  Pt arrived 15 min late to session. Reports increased swelling of L knee. She received Methotrexate injection for RA last week on Thursday in L quadriceps. Caretaker instructed to ask if pt can receive injection in different area to decrease swelling and pain in L knee. Limited treatment time today, but focused on strengthening of quadriceps and hip musculature. Pt reports taping helps tremendously, therefore, taped pt's L knee again. Noted increased tightness of hip flexors during treatment. 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 Educated on importance of strengthening hip musculature to help reduce knee pain.    PT Treatment/Interventions  ADLs/Self Care Home Management;Cryotherapy;Electrical Stimulation;Moist Heat;Ultrasound;Gait training;Stair training;Functional mobility training;Therapeutic activities;Therapeutic exercise;Balance training;Neuromuscular re-education;Patient/family education;Manual techniques;Passive range of motion;Dry needling;Taping    PT Next Visit Plan  Pt has limited vision, HEP update/review, taping prn, hip and knee strengthening, manual therapy prn, balance, hamstring stretching, hip flexor stretching    PT Home Exercise Plan  quad sets, heel slides, SAQs, sidelying clamshell, sit to stand from elevated surface    Consulted and Agree with Plan of Care  Patient;Family member/caregiver  Patient will benefit from skilled therapeutic intervention in order to improve the following deficits and impairments:  Abnormal gait, Improper body mechanics, Pain, Decreased mobility, Postural dysfunction, Decreased endurance, Decreased activity tolerance, Decreased range of motion, Decreased strength, Hypomobility, Decreased balance, Difficulty  walking, Impaired flexibility, Impaired vision/preception  Visit Diagnosis: Chronic pain of left knee  Other abnormalities of gait and mobility  Muscle weakness (generalized)  Decreased ROM of left knee     Problem List Patient Active Problem List   Diagnosis Date Noted  . Memory loss 04/12/2017  . Depression 04/12/2017  . Rheumatoid arthritis involving multiple sites with positive rheumatoid factor (HCC) 08/17/2016  . High risk medication use 08/17/2016  . H/O total knee replacement, bilateral 08/17/2016  . Spondylosis of lumbar region without myelopathy or radiculopathy 08/17/2016  . History of glaucoma/ patient is legally blind 08/17/2016  . History of gastroesophageal reflux (GERD) 08/17/2016  . History of diverticulosis 08/17/2016  . History of hypothyroidism 08/17/2016  . history of asthmatic bronchitis 08/17/2016  . History of memory loss 08/17/2016  . History of atherosclerosis 08/17/2016   Laurelyn Sickle, SPT 02/07/18 10:27 AM    [facility] [address] [place], [place], [address] Phone: [phone]   Fax:  [phone]  Name: Jordan [name] MRN: [id number] Date of Birth: 10/12/1933

## 2018-02-12 ENCOUNTER — Ambulatory Visit: Payer: Medicare Other | Admitting: Physical Therapy

## 2018-02-14 ENCOUNTER — Ambulatory Visit: Payer: Medicare Other | Admitting: Physical Therapy

## 2018-02-14 ENCOUNTER — Encounter: Payer: Self-pay | Admitting: Physical Therapy

## 2018-02-14 DIAGNOSIS — G8929 Other chronic pain: Secondary | ICD-10-CM | POA: Diagnosis not present

## 2018-02-14 DIAGNOSIS — M6281 Muscle weakness (generalized): Secondary | ICD-10-CM | POA: Diagnosis not present

## 2018-02-14 DIAGNOSIS — H5201 Hypermetropia, right eye: Secondary | ICD-10-CM | POA: Diagnosis not present

## 2018-02-14 DIAGNOSIS — M25662 Stiffness of left knee, not elsewhere classified: Secondary | ICD-10-CM | POA: Diagnosis not present

## 2018-02-14 DIAGNOSIS — R2689 Other abnormalities of gait and mobility: Secondary | ICD-10-CM

## 2018-02-14 DIAGNOSIS — H524 Presbyopia: Secondary | ICD-10-CM | POA: Diagnosis not present

## 2018-02-14 DIAGNOSIS — H52201 Unspecified astigmatism, right eye: Secondary | ICD-10-CM | POA: Diagnosis not present

## 2018-02-14 DIAGNOSIS — M25562 Pain in left knee: Principal | ICD-10-CM

## 2018-02-14 NOTE — Therapy (Signed)
[facility] Outpatient Rehabilitation [facility] [address] [place], [place], [address] Phone: [phone]   Fax:  [phone]  Physical Therapy Treatment  Patient Details  Name: Jordan [name] MRN: [id number] Date of Birth: 17-Aug-1933 Referring Provider: Valeria Batman, MD   Encounter Date: 02/14/2018  PT End of Session - 02/14/18 1034    Visit Number  4    Number of Visits  13    Date for PT Re-Evaluation  02/27/18    PT Start Time  1017    PT Stop Time  1055    PT Time Calculation (min)  38 min    Activity Tolerance  Patient tolerated treatment well    Behavior During Therapy  [facility] for tasks assessed/performed       Past Medical History:  Diagnosis Date  . Allergy   . Anxiety    per pt's daughter  . Arthritis   . Asthma    pt reported  . Depression   . GERD (gastroesophageal reflux disease)   . Hearing deficit L  . High cholesterol   . Hypertension   . Osteoporosis   . Stroke [facility])     Past Surgical History:  Procedure Laterality Date  . ABDOMINAL HYSTERECTOMY    . REPLACEMENT TOTAL KNEE BILATERAL Bilateral   . WRIST SURGERY Left     There were no vitals filed for this visit.  Subjective Assessment - 02/14/18 1022    Subjective  "I'm okay today. I have been doing my exercises about every other day. The tape definitely helped."    Currently in Pain?  Yes    Pain Score  3     Pain Location  Knee    Pain Orientation  Left    Pain Descriptors / Indicators  Aching    Pain Onset  More than a month ago    Pain Frequency  Constant    Pain Relieving Factors  exercises         [facility] PT Assessment - 02/14/18 0001      PROM   Left Knee Flexion  122                   [facility] Adult PT Treatment/Exercise - 02/14/18 0001      Knee/Hip Exercises: Stretches   Hip Flexor Stretch  2 reps;30 seconds with knee flexion to target quad      Knee/Hip Exercises: Standing   Hip Abduction  2 sets;10 reps;AROM;Stengthening verbal and  tactile cues to not bring leg forward    Stairs  4 steps x 1 set; pt utilized step to pattern with SPC +1 for guidance    Other Standing Knee Exercises  sit to stand 2 x 6; cues for forward weight shift and controlled descent      Knee/Hip Exercises: Supine   Bridges  2 sets;10 reps;AROM;Strengthening;Both    Straight Leg Raises  2 sets;5 reps;AROM;AAROM assistance required for first 3 reps               PT Short Term Goals - 01/16/18 1218      PT SHORT TERM GOAL #1   Title  pt will be I with HEP    Time  3    Period  Weeks    Status  New    Target Date  02/06/18      PT SHORT TERM GOAL #2   Title  Pt will verbalize/demo improved posture to decrease pain and improve functional mobility  Time  3    Period  Weeks    Status  New    Target Date  02/06/18      PT SHORT TERM GOAL #3   Title  Pt will report decreased pain in L knee to </= 2/10 to improve function and QOL    Time  3    Period  Weeks    Status  New    Target Date  02/06/18        PT Long Term Goals - 01/16/18 1219      PT LONG TERM GOAL #1   Title  Pt will increase L knee flexion AROM >/= 110 degrees to improve functional mobility and decrease pain to </= 2/10    Time  6    Period  Weeks    Status  New    Target Date  02/27/18      PT LONG TERM GOAL #2   Title  Pt will be able to stand >/= 20 minutes with LRAD in order to complete requirements for ADLs    Time  6    Period  Weeks    Status  New    Target Date  02/27/18      PT LONG TERM GOAL #3   Title  Pt will increase knee strength to >/= 4/5 to improve functional mobility and decrease pain </= 2/10     Time  6    Period  Weeks    Status  New    Target Date  02/27/18      PT LONG TERM GOAL #4   Title  Pt will improve FOTO score to </= 69% limited to improve function and QOL    Time  6    Period  Weeks    Status  New    Target Date  02/27/18      PT LONG TERM GOAL #5   Title  Pt will be I with HEP by last visit to improve function  and mobility    Time  6    Period  Weeks    Status  New    Target Date  02/27/18            Plan - 02/14/18 1056    Clinical Impression Statement  Treatment focused on stretcing of hip flexors and quads, strengthening of hip and knee musculature, and stair training, Pt progressing and showing inc strength with exercises. Measured L knee AROM; pt achieved 122 degrees, an increase from 103 degrees on day of evaluation. Pt demonstrates functional step to pattern on stairs while using SPC.    PT Treatment/Interventions  ADLs/Self Care Home Management;Cryotherapy;Electrical Stimulation;Moist Heat;Ultrasound;Gait training;Stair training;Functional mobility training;Therapeutic activities;Therapeutic exercise;Balance training;Neuromuscular re-education;Patient/family education;Manual techniques;Passive range of motion;Dry needling;Taping    PT Next Visit Plan  Pt has limited vision, HEP update/review, taping prn, hip and knee strengthening, manual therapy prn, balance, hamstring stretching, hip flexor stretching, standing hip and knee strengthening    PT Home Exercise Plan  quad sets, heel slides, SAQs, sidelying clamshell, sit to stand from elevated surface    Consulted and Agree with Plan of Care  Patient;Family member/caregiver       Patient will benefit from skilled therapeutic intervention in order to improve the following deficits and impairments:  Abnormal gait, Improper body mechanics, Pain, Decreased mobility, Postural dysfunction, Decreased endurance, Decreased activity tolerance, Decreased range of motion, Decreased strength, Hypomobility, Decreased balance, Difficulty walking, Impaired flexibility, Impaired vision/preception  Visit Diagnosis: Chronic pain of  left knee  Other abnormalities of gait and mobility  Muscle weakness (generalized)  Decreased ROM of left knee     Problem List Patient Active Problem List   Diagnosis Date Noted  . Memory loss 04/12/2017  .  Depression 04/12/2017  . Rheumatoid arthritis involving multiple sites with positive rheumatoid factor (HCC) 08/17/2016  . High risk medication use 08/17/2016  . H/O total knee replacement, bilateral 08/17/2016  . Spondylosis of lumbar region without myelopathy or radiculopathy 08/17/2016  . History of glaucoma/ patient is legally blind 08/17/2016  . History of gastroesophageal reflux (GERD) 08/17/2016  . History of diverticulosis 08/17/2016  . History of hypothyroidism 08/17/2016  . history of asthmatic bronchitis 08/17/2016  . History of memory loss 08/17/2016  . History of atherosclerosis 08/17/2016   Laurelyn Sickle, SPT 02/14/18 11:01 AM   [facility] [address] [place], [place], [address] Phone: [phone]   Fax:  [phone]  Name: Jordan [name] MRN: [id number] Date of Birth: 30-Sep-1933

## 2018-02-19 ENCOUNTER — Ambulatory Visit: Payer: Medicare Other | Admitting: Physical Therapy

## 2018-02-21 ENCOUNTER — Ambulatory Visit: Payer: Medicare Other | Admitting: Physical Therapy

## 2018-02-21 ENCOUNTER — Encounter: Payer: Self-pay | Admitting: Physical Therapy

## 2018-02-21 DIAGNOSIS — M25662 Stiffness of left knee, not elsewhere classified: Secondary | ICD-10-CM

## 2018-02-21 DIAGNOSIS — M25562 Pain in left knee: Secondary | ICD-10-CM | POA: Diagnosis not present

## 2018-02-21 DIAGNOSIS — R2689 Other abnormalities of gait and mobility: Secondary | ICD-10-CM

## 2018-02-21 DIAGNOSIS — M6281 Muscle weakness (generalized): Secondary | ICD-10-CM

## 2018-02-21 DIAGNOSIS — G8929 Other chronic pain: Secondary | ICD-10-CM

## 2018-02-21 NOTE — Therapy (Addendum)
[place], [place], [address] Phone: [phone]   Fax:  [phone]  Physical Therapy Treatment / Discharge Summary  Patient Details  Name: Jordan [name] MRN: [id number] Date of Birth: 02-19-1934 Referring Provider: Garald Balding, MD   Encounter Date: 02/21/2018  PT End of Session - 02/21/18 1106    Visit Number  5    Number of Visits  13    Date for PT Re-Evaluation  02/27/18    PT Start Time  1020 pt 5 min late    PT Stop Time  1059    PT Time Calculation (min)  39 min    Activity Tolerance  Patient tolerated treatment well    Behavior During Therapy  [facility] for tasks assessed/performed       Past Medical History:  Diagnosis Date  . Allergy   . Anxiety    per pt's daughter  . Arthritis   . Asthma    pt reported  . Depression   . GERD (gastroesophageal reflux disease)   . Hearing deficit L  . High cholesterol   . Hypertension   . Osteoporosis   . Stroke [facility])     Past Surgical History:  Procedure Laterality Date  . ABDOMINAL HYSTERECTOMY    . REPLACEMENT TOTAL KNEE BILATERAL Bilateral   . WRIST SURGERY Left     There were no vitals filed for this visit.  Subjective Assessment - 02/21/18 1023    Subjective  "I am so-so today. I have been doing my exercises."    Currently in Pain?  Yes    Pain Score  5     Pain Location  Knee    Pain Orientation  Left    Pain Descriptors / Indicators  Aching    Pain Type  Chronic pain    Pain Radiating Towards  a little pain in the back    Pain Onset  More than a month ago    Pain Frequency  Constant    Aggravating Factors   activity    Pain Relieving Factors  bengay, brace                       [facility] Adult PT Treatment/Exercise - 02/21/18 0001      Knee/Hip Exercises: Stretches   Quad Stretch  2 reps;30 seconds sidelying      Knee/Hip Exercises: Aerobic   Nustep  L2 x 5 min      Knee/Hip Exercises: Standing    Forward Step Up  2 sets;10 reps;Left;Hand Hold: 2;Hand Hold: 1 2 hand hold on 1st set, 1 hand hold on 2nd set    Other Standing Knee Exercises  sit to stand 2 x 10; cues for controlled descent      Knee/Hip Exercises: Seated   Long Arc Quad  Left;2 sets;10 reps;5 reps emphasis on eccentric control      Knee/Hip Exercises: Supine   Bridges  1 set;10 reps    Straight Leg Raises  1 set;Left;10 reps      Knee/Hip Exercises: Sidelying   Hip ABduction  Left;1 set;10 reps cues to roll hips forward               PT Short Term Goals - 01/16/18 1218      PT SHORT TERM GOAL #1   Title  pt will be I with HEP    Time  3    Period  Weeks  Status  New    Target Date  02/06/18      PT SHORT TERM GOAL #2   Title  Pt will verbalize/demo improved posture to decrease pain and improve functional mobility    Time  3    Period  Weeks    Status  New    Target Date  02/06/18      PT SHORT TERM GOAL #3   Title  Pt will report decreased pain in L knee to </= 2/10 to improve function and QOL    Time  3    Period  Weeks    Status  New    Target Date  02/06/18        PT Long Term Goals - 01/16/18 1219      PT LONG TERM GOAL #1   Title  Pt will increase L knee flexion AROM >/= 110 degrees to improve functional mobility and decrease pain to </= 2/10    Time  6    Period  Weeks    Status  New    Target Date  02/27/18      PT LONG TERM GOAL #2   Title  Pt will be able to stand >/= 20 minutes with LRAD in order to complete requirements for ADLs    Time  6    Period  Weeks    Status  New    Target Date  02/27/18      PT LONG TERM GOAL #3   Title  Pt will increase knee strength to >/= 4/5 to improve functional mobility and decrease pain </= 2/10     Time  6    Period  Weeks    Status  New    Target Date  02/27/18      PT LONG TERM GOAL #4   Title  Pt will improve FOTO score to </= 69% limited to improve function and QOL    Time  6    Period  Weeks    Status  New    Target  Date  02/27/18      PT LONG TERM GOAL #5   Title  Pt will be I with HEP by last visit to improve function and mobility    Time  6    Period  Weeks    Status  New    Target Date  02/27/18            Plan - 02/21/18 1249    Clinical Impression Statement  Treatment focused on strengthening of hip and knee musculature. Performed sit to stand from lower surface today with minimal hand support. Also practiced foward step-ups on 4 in step. Pt reports some increased pain with this exercise, but performs well., Pt continuing to show progress and reports she feels better since she started physical therapy.     PT Treatment/Interventions  ADLs/Self Care Home Management;Cryotherapy;Electrical Stimulation;Moist Heat;Ultrasound;Gait training;Stair training;Functional mobility training;Therapeutic activities;Therapeutic exercise;Balance training;Neuromuscular re-education;Patient/family education;Manual techniques;Passive range of motion;Dry needling;Taping    PT Next Visit Plan  Pt has limited vision, HEP update/review, taping prn, hip and knee strengthening, manual therapy prn, balance, hamstring stretching, hip flexor stretching, standing hip and knee strengthening; functional standing exercises, finalize HEP    PT Home Exercise Plan  quad sets, heel slides, SAQs, sidelying clamshell, sit to stand from elevated surface    Consulted and Agree with Plan of Care  Patient;Family member/caregiver       Patient will benefit from skilled therapeutic intervention  in order to improve the following deficits and impairments:  Abnormal gait, Improper body mechanics, Pain, Decreased mobility, Postural dysfunction, Decreased endurance, Decreased activity tolerance, Decreased range of motion, Decreased strength, Hypomobility, Decreased balance, Difficulty walking, Impaired flexibility, Impaired vision/preception  Visit Diagnosis: Chronic pain of left knee  Other abnormalities of gait and mobility  Muscle  weakness (generalized)  Decreased ROM of left knee     Problem List Patient Active Problem List   Diagnosis Date Noted  . Memory loss 04/12/2017  . Depression 04/12/2017  . Rheumatoid arthritis involving multiple sites with positive rheumatoid factor ([place]) 08/17/2016  . High risk medication use 08/17/2016  . H/O total knee replacement, bilateral 08/17/2016  . Spondylosis of lumbar region without myelopathy or radiculopathy 08/17/2016  . History of glaucoma/ patient is legally blind 08/17/2016  . History of gastroesophageal reflux (GERD) 08/17/2016  . History of diverticulosis 08/17/2016  . History of hypothyroidism 08/17/2016  . history of asthmatic bronchitis 08/17/2016  . History of memory loss 08/17/2016  . History of atherosclerosis 08/17/2016   Worthy Flank, SPT 02/21/18 12:54 PM   [facility] [address] [place], [place], [address] Phone: [phone]   Fax:  [phone]  Name: Jordan [name] MRN: [id number] Date of Birth: 05-Sep-1933         PHYSICAL THERAPY DISCHARGE SUMMARY  Visits from Start of Care: 5  Current functional level related to goals / functional outcomes: See goals   Remaining deficits: unknown   Education / Equipment: HEP, theraband  Plan: Patient agrees to discharge.  Patient goals were partially met. Patient is being discharged due to not returning since the last visit.  ?????         Kristoffer Leamon PT, DPT, LAT, ATC  04/17/18  3:35 PM

## 2018-03-29 ENCOUNTER — Ambulatory Visit: Payer: Medicare Other | Admitting: Rheumatology

## 2018-04-02 ENCOUNTER — Telehealth: Payer: Self-pay | Admitting: Rheumatology

## 2018-04-02 DIAGNOSIS — Z9225 Personal history of immunosupression therapy: Secondary | ICD-10-CM

## 2018-04-02 NOTE — Telephone Encounter (Signed)
Patient's daughter Jordan [name] advised Jordan [name] is over due for labs. Jordan [name] states she will have her in tomorrow for labs. After labs will refill medication. Patient's daughter advised okay to give MTX even if out of Enbrel.

## 2018-04-02 NOTE — Telephone Encounter (Signed)
Attempted to contact [name] regarding her mother Jordan [name]. Left message for her to contact the office. Patient is due for labs.

## 2018-04-02 NOTE — Telephone Encounter (Signed)
Nelva Bush, patient's daughter, called requesting prescription refill of Enbrel and Methotrexate to be sent to [facility] on [address] in [place].  Imojean stated that her mom is out of medication and was not able to get the injection last Thursday.  She stated that her brother did not give Jonnie the Methotrexate because she was out of Enbrel.

## 2018-04-03 ENCOUNTER — Other Ambulatory Visit: Payer: Self-pay

## 2018-04-03 DIAGNOSIS — Z9225 Personal history of immunosupression therapy: Secondary | ICD-10-CM

## 2018-04-03 DIAGNOSIS — Z79899 Other long term (current) drug therapy: Secondary | ICD-10-CM

## 2018-04-05 DIAGNOSIS — Z79899 Other long term (current) drug therapy: Secondary | ICD-10-CM | POA: Diagnosis not present

## 2018-04-05 DIAGNOSIS — Z9225 Personal history of immunosupression therapy: Secondary | ICD-10-CM | POA: Diagnosis not present

## 2018-04-05 MED ORDER — ETANERCEPT 50 MG/ML ~~LOC~~ SOAJ: SUBCUTANEOUS | 0 refills | Status: DC

## 2018-04-05 MED ORDER — METHOTREXATE 2.5 MG PO TABS: ORAL_TABLET | ORAL | 0 refills | Status: DC

## 2018-04-05 NOTE — Telephone Encounter (Signed)
Prescriptions sent to the pharmacy. Patient came to the office to update labs.

## 2018-04-05 NOTE — Addendum Note (Signed)
Addended by: Henriette Combs on: 04/05/2018 02:25 PM   Modules accepted: Orders

## 2018-04-06 NOTE — Progress Notes (Signed)
Patient was seen yesterday and was clinically doing well.  Her creatinine has gone up.  Please advise her to reduce methotrexate to 3 tablets p.o. weekly.

## 2018-04-10 LAB — COMPLETE METABOLIC PANEL WITH GFR
AG Ratio: 1.5 (calc) (ref 1.0–2.5)
ALT: 6 U/L (ref 6–29)
AST: 15 U/L (ref 10–35)
Albumin: 4.2 g/dL (ref 3.6–5.1)
Alkaline phosphatase (APISO): 72 U/L (ref 33–130)
BUN/Creatinine Ratio: 16 (calc) (ref 6–22)
BUN: 19 mg/dL (ref 7–25)
CO2: 26 mmol/L (ref 20–32)
Calcium: 9.5 mg/dL (ref 8.6–10.4)
Chloride: 100 mmol/L (ref 98–110)
Creat: 1.16 mg/dL — ABNORMAL HIGH (ref 0.60–0.88)
GFR, Est African American: 51 mL/min/{1.73_m2} — ABNORMAL LOW (ref >=60)
GFR, Est Non African American: 44 mL/min/{1.73_m2} — ABNORMAL LOW (ref >=60)
Globulin: 2.8 g/dL (calc) (ref 1.9–3.7)
Glucose, Bld: 77 mg/dL (ref 65–99)
Potassium: 4.7 mmol/L (ref 3.5–5.3)
Sodium: 138 mmol/L (ref 135–146)
Total Bilirubin: 0.6 mg/dL (ref 0.2–1.2)
Total Protein: 7 g/dL (ref 6.1–8.1)

## 2018-04-10 LAB — CBC WITH DIFFERENTIAL/PLATELET
Basophils Absolute: 72 cells/uL (ref 0–200)
Basophils Relative: 0.9 %
Eosinophils Absolute: 40 cells/uL (ref 15–500)
Eosinophils Relative: 0.5 %
HCT: 40.7 % (ref 35.0–45.0)
Hemoglobin: 12.9 g/dL (ref 11.7–15.5)
Lymphs Abs: 2616 cells/uL (ref 850–3900)
MCH: 24.1 pg — ABNORMAL LOW (ref 27.0–33.0)
MCHC: 31.7 g/dL — ABNORMAL LOW (ref 32.0–36.0)
MCV: 76.1 fL — ABNORMAL LOW (ref 80.0–100.0)
MPV: 12.2 fL (ref 7.5–12.5)
Monocytes Relative: 9.7 %
Neutro Abs: 4496 cells/uL (ref 1500–7800)
Neutrophils Relative %: 56.2 %
Platelets: 218 10*3/uL (ref 140–400)
RBC: 5.35 10*6/uL — ABNORMAL HIGH (ref 3.80–5.10)
RDW: 17.1 % — ABNORMAL HIGH (ref 11.0–15.0)
Total Lymphocyte: 32.7 %
WBC mixed population: 776 cells/uL (ref 200–950)
WBC: 8 10*3/uL (ref 3.8–10.8)

## 2018-04-10 LAB — QUANTIFERON-TB GOLD PLUS
Mitogen-NIL: >10 IU/mL
NIL: 0.04 IU/mL
QuantiFERON-TB Gold Plus: NEGATIVE
TB1-NIL: 0 IU/mL
TB2-NIL: 0 IU/mL

## 2018-04-15 ENCOUNTER — Other Ambulatory Visit: Payer: Self-pay | Admitting: Rheumatology

## 2018-04-15 ENCOUNTER — Other Ambulatory Visit: Payer: Self-pay | Admitting: Neurology

## 2018-04-16 DIAGNOSIS — F0390 Unspecified dementia without behavioral disturbance: Secondary | ICD-10-CM

## 2018-04-16 DIAGNOSIS — N390 Urinary tract infection, site not specified: Secondary | ICD-10-CM | POA: Diagnosis not present

## 2018-04-16 DIAGNOSIS — J309 Allergic rhinitis, unspecified: Secondary | ICD-10-CM | POA: Diagnosis not present

## 2018-04-16 DIAGNOSIS — E039 Hypothyroidism, unspecified: Secondary | ICD-10-CM | POA: Diagnosis not present

## 2018-04-16 NOTE — Telephone Encounter (Signed)
Last Visit: 12/19/17 Next Visit: 05/22/18  Okay to refill per Dr. Corliss Skains

## 2018-04-28 ENCOUNTER — Other Ambulatory Visit: Payer: Self-pay | Admitting: Rheumatology

## 2018-05-08 NOTE — Progress Notes (Signed)
Office Visit Note  Patient: Jordan [name]             Date of Birth: 1934-04-19           MRN: [id number]             PCP: Arnette Felts, FNP Referring: Arnette Felts, FNP Visit Date: 05/22/2018 Occupation: @GUAROCC @  Subjective:  Medication monitoring   History of Present Illness: Jordan [name] is a 84 y.o. female with history of seropositive rheumatoid arthritis and DDD.  She is on Enbrel 50 mg sq injections once weekly, MTX 3 tablets by mouth once weekly, and folic acid 1 mg by mouth daily.  She denies any recent rheumatoid arthritis flares.  She denies missing any doses of methotrexate or Enbrel recently.  She states she has not noticed any difference since decreasing her dose of methotrexate from 4 tablets by mouth once weekly to 3 tablets by mouth once weekly.  She denies any joint pain or joint swelling.  She reports that her lower back is doing well.  She states her bilateral knee replacements are doing well.  She states that she experiences some left knee stiffness at times.    Activities of Daily Living:  Patient reports morning stiffness for 0 minutes.   Patient Denies nocturnal pain.  Difficulty dressing/grooming: Reports Difficulty climbing stairs: Reports Difficulty getting out of chair: Denies Difficulty using hands for taps, buttons, cutlery, and/or writing: Reports  Review of Systems  Constitutional: Negative for fatigue.  HENT: Negative for mouth sores, trouble swallowing, trouble swallowing, mouth dryness and nose dryness.   Eyes: Negative for pain, redness, itching, visual disturbance and dryness.  Respiratory: Negative for cough, hemoptysis, shortness of breath, wheezing and difficulty breathing.   Cardiovascular: Negative for chest pain, palpitations, hypertension and swelling in legs/feet.  Gastrointestinal: Negative for abdominal pain, blood in stool, constipation, diarrhea, nausea and vomiting.  Endocrine: Negative for increased urination.    Genitourinary: Negative for painful urination, nocturia and pelvic pain.  Musculoskeletal: Negative for arthralgias, joint pain, joint swelling, myalgias, muscle weakness, morning stiffness, muscle tenderness and myalgias.  Skin: Negative for color change, pallor, rash, hair loss, nodules/bumps, skin tightness, ulcers and sensitivity to sunlight.  Allergic/Immunologic: Negative for susceptible to infections.  Neurological: Positive for memory loss. Negative for dizziness, fainting, light-headedness, numbness, headaches and weakness.  Hematological: Negative for swollen glands.  Psychiatric/Behavioral: Negative for depressed mood and sleep disturbance. The patient is not nervous/anxious.     PMFS History:  Patient Active Problem List   Diagnosis Date Noted  . Memory loss 04/12/2017  . Depression 04/12/2017  . Rheumatoid arthritis involving multiple sites with positive rheumatoid factor (HCC) 08/17/2016  . High risk medication use 08/17/2016  . H/O total knee replacement, bilateral 08/17/2016  . Spondylosis of lumbar region without myelopathy or radiculopathy 08/17/2016  . History of glaucoma/ patient is legally blind 08/17/2016  . History of gastroesophageal reflux (GERD) 08/17/2016  . History of diverticulosis 08/17/2016  . History of hypothyroidism 08/17/2016  . history of asthmatic bronchitis 08/17/2016  . History of memory loss 08/17/2016  . History of atherosclerosis 08/17/2016    Past Medical History:  Diagnosis Date  . Allergy   . Anxiety    per pt's daughter  . Arthritis   . Asthma    pt reported  . Depression   . GERD (gastroesophageal reflux disease)   . Hearing deficit L  . High cholesterol   . Hypertension   . Osteoporosis   . Stroke [facility])  History reviewed. No pertinent family history. Past Surgical History:  Procedure Laterality Date  . ABDOMINAL HYSTERECTOMY    . REPLACEMENT TOTAL KNEE BILATERAL Bilateral   . WRIST SURGERY Left    Social History    Social History Narrative   Lives at home with grandson.   Right-handed.   2-3 cups coffee per day.    Objective: Vital Signs: BP (!) 153/88 (BP Location: Left Arm, Patient Position: Sitting, Cuff Size: Normal)   Pulse (!) 51   Resp 12   Ht 5' 1.5" (1.562 m)   Wt 107 lb (48.5 kg)   BMI 19.89 kg/m    Physical Exam  Constitutional: She is oriented to person, place, and time. She appears well-developed and well-nourished.  HENT:  Head: Normocephalic and atraumatic.  Eyes: Conjunctivae and EOM are normal.  Neck: Normal range of motion.  Cardiovascular: Normal rate, regular rhythm, normal heart sounds and intact distal pulses.  Pulmonary/Chest: Effort normal and breath sounds normal.  Abdominal: Soft. Bowel sounds are normal.  Lymphadenopathy:    She has no cervical adenopathy.  Neurological: She is alert and oriented to person, place, and time.  Skin: Skin is warm and dry. Capillary refill takes less than 2 seconds.  Psychiatric: She has a normal mood and affect. Her behavior is normal.  Nursing note and vitals reviewed.    Musculoskeletal Exam: C-spine good ROM with no discomfort.  Thoracic and lumbar spine good ROM.  No midline spinal tenderness.  No SI joint tenderness.  Shoulder joints, elbow joints, wrist joints, MCPs, PIPs, and DIPs good ROM with no synovitis.  Synovial thickening of MCP joints but no synovitis.  Hip joints, knee joints, ankle joints, MTPs, PIPs, and DIPs good ROM with no synovitis.  No warmth or effusion of knee replacements. No tenderness or swelling of ankle joints.   CDAI Exam: CDAI Score: 1  Patient Global Assessment: 5 (mm); Provider Global Assessment: 5 (mm) Swollen: 0 ; Tender: 0  Joint Exam   Not documented   There is currently no information documented on the homunculus. Go to the Rheumatology activity and complete the homunculus joint exam.  Investigation: No additional findings.  Imaging: No results found.  Recent Labs: Lab Results   Component Value Date   WBC 8.0 04/05/2018   HGB 12.9 04/05/2018   PLT 218 04/05/2018   NA 138 04/05/2018   K 4.7 04/05/2018   CL 100 04/05/2018   CO2 26 04/05/2018   GLUCOSE 77 04/05/2018   BUN 19 04/05/2018   CREATININE 1.16 (H) 04/05/2018   BILITOT 0.6 04/05/2018   ALKPHOS 65 01/09/2017   AST 15 04/05/2018   ALT 6 04/05/2018   PROT 7.0 04/05/2018   ALBUMIN 4.0 01/09/2017   CALCIUM 9.5 04/05/2018   GFRAA 51 (L) 04/05/2018   QFTBGOLDPLUS NEGATIVE 04/05/2018    Procedures:  No procedures performed Allergies: Penicillins   Assessment / Plan:     Visit Diagnoses: Rheumatoid arthritis involving multiple sites with positive rheumatoid factor (HCC) - She has no synovitis on exam.  She has synovial thickening of all MCP joints but no tenderness. Lab work obtained on 04/05/18 revealed elevated creatinine.  She was advised to reduce the dose of MTX from 4 tablets by mouth once weekly to 3 tablets by mouth once weekly.  She has not noticed any increased joint pain or joint swelling since reducing the dose.  She will continue on Enbrel 50 mg sq injections once weekly, MTX 3 tablets by mouth once weekly,  and folic acid 1 mg by mouth daily.  A refill of MTX was sent to the pharmacy.  She was encouraged to get the yearly influenza vaccination.  She was advised to notify us if she develops increased joint pain or joint swelling.  She will follow up in the office in 5 months. Plan: methotrexate (RHEUMATREX) 2.5 MG tablet  High risk medication use - Enbrel 50 mg sq  qweek, MTX 3 tabs po qwk, Folic acid 1mg  po qd. CBC and CMP were drawn on 04/05/18.  Creatinine continues to trend up.  She was advised to reduce the dose of MTX from 4 tablets once weekly to 3 tablets once weekly.  She will return for lab work in December and every 3 months to monitor for drug toxicity.  Last TB gold negative on 04/05/18.   Trigger middle finger of left hand -Resolved   Trigger ring finger of left hand -Resolved   H/O  total knee replacement, bilateral: No warmth or effusion of knee joint replacements.  She has good ROM.  She has occasional left knee joint stiffness.   DDD (degenerative disc disease), lumbar: No midline spinal tenderness.  She has not discomfort at this time.   Other medical conditions are listed as follows:   History of glaucoma/ patient is legally blind  history of asthmatic bronchitis  History of memory loss  History of diverticulosis  History of atherosclerosis  History of gastroesophageal reflux (GERD)  History of hypothyroidism    Orders: No orders of the defined types were placed in this encounter.  Meds ordered this encounter  Medications  . methotrexate (RHEUMATREX) 2.5 MG tablet    Sig: Take 3 tablets (7.5 mg total) by mouth once a week. Caution:Chemotherapy. Protect from light.    Dispense:  36 tablet    Refill:  0    Dose Change     Follow-Up Instructions: Return in about 5 months (around 10/21/2018) for Rheumatoid arthritis, DDD.   10/23/2018, MD  Note - This record has been created using Pollyann Savoy.  Chart creation errors have been sought, but may not always  have been located. Such creation errors do not reflect on  the standard of medical care.

## 2018-05-22 ENCOUNTER — Ambulatory Visit (INDEPENDENT_AMBULATORY_CARE_PROVIDER_SITE_OTHER): Payer: Medicare Other | Admitting: Rheumatology

## 2018-05-22 ENCOUNTER — Encounter: Payer: Self-pay | Admitting: Rheumatology

## 2018-05-22 VITALS — BP 153/88 | HR 51 | Resp 12 | Ht 61.5 in | Wt 107.0 lb

## 2018-05-22 DIAGNOSIS — M65332 Trigger finger, left middle finger: Secondary | ICD-10-CM | POA: Diagnosis not present

## 2018-05-22 DIAGNOSIS — Z8639 Personal history of other endocrine, nutritional and metabolic disease: Secondary | ICD-10-CM

## 2018-05-22 DIAGNOSIS — Z8679 Personal history of other diseases of the circulatory system: Secondary | ICD-10-CM

## 2018-05-22 DIAGNOSIS — M0579 Rheumatoid arthritis with rheumatoid factor of multiple sites without organ or systems involvement: Secondary | ICD-10-CM | POA: Diagnosis not present

## 2018-05-22 DIAGNOSIS — Z8719 Personal history of other diseases of the digestive system: Secondary | ICD-10-CM

## 2018-05-22 DIAGNOSIS — Z8709 Personal history of other diseases of the respiratory system: Secondary | ICD-10-CM

## 2018-05-22 DIAGNOSIS — Z8669 Personal history of other diseases of the nervous system and sense organs: Secondary | ICD-10-CM

## 2018-05-22 DIAGNOSIS — Z96653 Presence of artificial knee joint, bilateral: Secondary | ICD-10-CM

## 2018-05-22 DIAGNOSIS — Z79899 Other long term (current) drug therapy: Secondary | ICD-10-CM

## 2018-05-22 DIAGNOSIS — Z87898 Personal history of other specified conditions: Secondary | ICD-10-CM

## 2018-05-22 DIAGNOSIS — M65342 Trigger finger, left ring finger: Secondary | ICD-10-CM

## 2018-05-22 DIAGNOSIS — M5136 Other intervertebral disc degeneration, lumbar region: Secondary | ICD-10-CM

## 2018-05-22 MED ORDER — METHOTREXATE 2.5 MG PO TABS: 7.5000 mg | ORAL_TABLET | ORAL | 0 refills | Status: DC

## 2018-05-22 NOTE — Patient Instructions (Signed)
Standing Labs We placed an order today for your standing lab work.    Please come back and get your standing labs in December and every 3 months   We have open lab Monday through Friday from 8:30-11:30 AM and 1:30-4:00 PM  at the office of Dr. Shaili Deveshwar.   You may experience shorter wait times on Monday and Friday afternoons. The office is located at [address], [address], [place], [place] [address] No appointment is necessary.   Labs are drawn by [facility].  You may receive a bill from [facility] for your lab work. If you have any questions regarding directions or hours of operation,  please call [phone].   Just as a reminder please drink plenty of water prior to coming for your lab work. Thanks!   

## 2018-06-05 ENCOUNTER — Other Ambulatory Visit: Payer: Self-pay | Admitting: Nurse Practitioner

## 2018-06-08 ENCOUNTER — Other Ambulatory Visit: Payer: Self-pay | Admitting: Nurse Practitioner

## 2018-06-11 ENCOUNTER — Other Ambulatory Visit: Payer: Self-pay | Admitting: Nurse Practitioner

## 2018-06-14 ENCOUNTER — Other Ambulatory Visit: Payer: Self-pay | Admitting: Neurology

## 2018-06-26 ENCOUNTER — Other Ambulatory Visit: Payer: Self-pay

## 2018-06-26 NOTE — Patient Outreach (Signed)
[facility]) Care Management  06/26/2018  [name] 17-Aug-1933 [id number]   Medication Adherence call to [name] left a message for patient to call back patient is due on Lisinopril 20 mg. [name] is showing past due under [facility] Ins.   Lillia Abed CPhT Pharmacy Technician [facility] Management Direct Dial [phone]  Fax [phone] [email or web address]

## 2018-07-04 ENCOUNTER — Other Ambulatory Visit: Payer: Self-pay | Admitting: *Deleted

## 2018-07-04 MED ORDER — ETANERCEPT 50 MG/ML ~~LOC~~ SOAJ: SUBCUTANEOUS | 0 refills | Status: DC

## 2018-07-04 NOTE — Telephone Encounter (Signed)
Refill request received via fax  Last Visit: 05/22/18 Next visit: 10/24/18 Labs: 04/05/18 elevated Creat.  TB Gold: 04/05/18 Neg   Left message to advise patient she is due for labs this month.   Okay to refill 30 day supply per Dr. Corliss Skains

## 2018-07-06 ENCOUNTER — Other Ambulatory Visit: Payer: Self-pay | Admitting: Nurse Practitioner

## 2018-07-09 ENCOUNTER — Other Ambulatory Visit: Payer: Self-pay | Admitting: Nurse Practitioner

## 2018-07-09 MED ORDER — NAMZARIC 28-10 MG PO CP24: 1.0000 | ORAL_CAPSULE | Freq: Every day | ORAL | 2 refills | Status: DC

## 2018-07-10 ENCOUNTER — Other Ambulatory Visit: Payer: Self-pay

## 2018-07-10 DIAGNOSIS — Z79899 Other long term (current) drug therapy: Secondary | ICD-10-CM

## 2018-07-11 ENCOUNTER — Other Ambulatory Visit: Payer: Self-pay | Admitting: Nurse Practitioner

## 2018-07-11 ENCOUNTER — Other Ambulatory Visit: Payer: Self-pay | Admitting: Rheumatology

## 2018-07-11 DIAGNOSIS — M0579 Rheumatoid arthritis with rheumatoid factor of multiple sites without organ or systems involvement: Secondary | ICD-10-CM

## 2018-07-11 LAB — COMPLETE METABOLIC PANEL WITH GFR
AG Ratio: 1.8 (calc) (ref 1.0–2.5)
ALT: 5 U/L — ABNORMAL LOW (ref 6–29)
AST: 13 U/L (ref 10–35)
Albumin: 4.3 g/dL (ref 3.6–5.1)
Alkaline phosphatase (APISO): 48 U/L (ref 33–130)
BUN: 14 mg/dL (ref 7–25)
CO2: 28 mmol/L (ref 20–32)
Calcium: 9.7 mg/dL (ref 8.6–10.4)
Chloride: 102 mmol/L (ref 98–110)
Creat: 0.79 mg/dL (ref 0.60–0.88)
GFR, Est African American: 81 mL/min/{1.73_m2} (ref >=60)
GFR, Est Non African American: 70 mL/min/{1.73_m2} (ref >=60)
Globulin: 2.4 g/dL (calc) (ref 1.9–3.7)
Glucose, Bld: 69 mg/dL (ref 65–99)
Potassium: 4.5 mmol/L (ref 3.5–5.3)
Sodium: 141 mmol/L (ref 135–146)
Total Bilirubin: 0.5 mg/dL (ref 0.2–1.2)
Total Protein: 6.7 g/dL (ref 6.1–8.1)

## 2018-07-11 LAB — CBC WITH DIFFERENTIAL/PLATELET
Absolute Monocytes: 745 cells/uL (ref 200–950)
Basophils Absolute: 51 cells/uL (ref 0–200)
Basophils Relative: 0.7 %
Eosinophils Absolute: 29 cells/uL (ref 15–500)
Eosinophils Relative: 0.4 %
HCT: 37.8 % (ref 35.0–45.0)
Hemoglobin: 12 g/dL (ref 11.7–15.5)
Lymphs Abs: 2840 cells/uL (ref 850–3900)
MCH: 24.6 pg — ABNORMAL LOW (ref 27.0–33.0)
MCHC: 31.7 g/dL — ABNORMAL LOW (ref 32.0–36.0)
MCV: 77.5 fL — ABNORMAL LOW (ref 80.0–100.0)
MPV: 12.4 fL (ref 7.5–12.5)
Monocytes Relative: 10.2 %
Neutro Abs: 3635 cells/uL (ref 1500–7800)
Neutrophils Relative %: 49.8 %
Platelets: 203 10*3/uL (ref 140–400)
RBC: 4.88 10*6/uL (ref 3.80–5.10)
RDW: 16.1 % — ABNORMAL HIGH (ref 11.0–15.0)
Total Lymphocyte: 38.9 %
WBC: 7.3 10*3/uL (ref 3.8–10.8)

## 2018-07-11 NOTE — Telephone Encounter (Signed)
Last Visit: 05/22/18 Next visit: 10/24/18 Labs: 07/10/18 Stable  Okay to refill per Dr. Corliss Skains

## 2018-07-12 ENCOUNTER — Other Ambulatory Visit: Payer: Self-pay | Admitting: Rheumatology

## 2018-07-12 NOTE — Telephone Encounter (Signed)
Last Visit: 05/22/18 Next visit: 10/24/18  Okay to refill per Dr. Corliss Skains

## 2018-07-16 ENCOUNTER — Telehealth: Payer: Self-pay

## 2018-07-16 NOTE — Telephone Encounter (Signed)
Patient's daughter called stating she would like for either [name] or her nurse to give her a call she had some questions regarding th patient's levothyroxine and why she is having some much weight loss. [phone]  Returned her call after speaking with Arnette Felts FNP-BC and advised pt to take the medication once daily mon-sat and hold the medicine on sundays. Patient was supposed to return to the office within 6 weeks of her last appointment to have bloodwork but she did not. Advised her daughter to have the patient start taking the medication as directed and we will recheck her levels on 01/20 and due to her not taking the medications as directed that may be the reason she is having weight loss. YRL,RMA

## 2018-08-04 ENCOUNTER — Other Ambulatory Visit: Payer: Self-pay | Admitting: Neurology

## 2018-08-04 ENCOUNTER — Other Ambulatory Visit: Payer: Self-pay | Admitting: Rheumatology

## 2018-08-06 NOTE — Telephone Encounter (Signed)
Last Visit: 05/22/18 Next visit: 10/24/18 Labs: 07/10/18 stable TB Gold: 04/05/18 Neg   Okay to refill per Dr. Corliss Skains

## 2018-08-13 ENCOUNTER — Other Ambulatory Visit: Payer: Self-pay

## 2018-08-13 ENCOUNTER — Ambulatory Visit (INDEPENDENT_AMBULATORY_CARE_PROVIDER_SITE_OTHER): Payer: Medicare Other | Admitting: Nurse Practitioner

## 2018-08-13 ENCOUNTER — Encounter: Payer: Self-pay | Admitting: Nurse Practitioner

## 2018-08-13 VITALS — BP 172/88 | HR 68 | Temp 98.1°F | Ht 59.8 in | Wt 105.0 lb

## 2018-08-13 DIAGNOSIS — Z9114 Patient's other noncompliance with medication regimen: Secondary | ICD-10-CM

## 2018-08-13 DIAGNOSIS — I10 Essential (primary) hypertension: Secondary | ICD-10-CM | POA: Insufficient documentation

## 2018-08-13 DIAGNOSIS — R21 Rash and other nonspecific skin eruption: Secondary | ICD-10-CM | POA: Diagnosis not present

## 2018-08-13 DIAGNOSIS — J069 Acute upper respiratory infection, unspecified: Secondary | ICD-10-CM | POA: Diagnosis not present

## 2018-08-13 DIAGNOSIS — E039 Hypothyroidism, unspecified: Secondary | ICD-10-CM | POA: Insufficient documentation

## 2018-08-13 DIAGNOSIS — Z760 Encounter for issue of repeat prescription: Secondary | ICD-10-CM

## 2018-08-13 MED ORDER — BACITRACIN ZINC 500 UNIT/GM EX OINT: TOPICAL_OINTMENT | CUTANEOUS | 0 refills | Status: AC

## 2018-08-13 MED ORDER — DOXYCYCLINE HYCLATE 100 MG PO TABS: 100.0000 mg | ORAL_TABLET | Freq: Two times a day (BID) | ORAL | 0 refills | Status: DC

## 2018-08-13 MED ORDER — LISINOPRIL 20 MG PO TABS: 20.0000 mg | ORAL_TABLET | Freq: Every day | ORAL | 1 refills | Status: DC

## 2018-08-13 NOTE — Progress Notes (Signed)
Subjective:     Patient ID: Jordan [name] , female    DOB: 10/04/1933 , 85 y.o.   MRN: [id number]   Chief Complaint  Patient presents with  . URI    3 weeks   . Nasal Congestion  . chest congestion  . Cough  . Medication Refill    all meds    HPI  Hypertension  This is a chronic (has been without her medications for about a week.  ) problem. The current episode started more than 1 year ago. The problem has been gradually worsening since onset. The problem is uncontrolled. Pertinent negatives include no anxiety, chest pain, headaches, palpitations, peripheral edema or shortness of breath. There are no known risk factors for coronary artery disease. Identifiable causes of hypertension include a thyroid problem.  URI   This is a new problem. The current episode started 1 to 4 weeks ago. The problem has been gradually worsening. There has been no fever. Associated symptoms include congestion, coughing (dry cough) and a sore throat. Pertinent negatives include no chest pain or headaches. Treatments tried: cough syrup. The treatment provided mild relief.  Thyroid Problem  Presents for follow-up visit. Patient reports no anxiety, constipation, fatigue or palpitations. The symptoms have been improving.     Past Medical History:  Diagnosis Date  . Allergy   . Anxiety    per pt's daughter  . Arthritis   . Asthma    pt reported  . Depression   . GERD (gastroesophageal reflux disease)   . Hearing deficit L  . High cholesterol   . Hypertension   . Osteoporosis   . Stroke [facility])      No family history on file.   Current Outpatient Medications:  .  albuterol (PROVENTIL HFA;VENTOLIN HFA) 108 (90 Base) MCG/ACT inhaler, Inhale 1 puff into the lungs every 6 (six) hours as needed for wheezing or shortness of breath., Disp: , Rfl:  .  atropine 1 % ophthalmic solution, INSTILL 1 GTT INTO OS D, Disp: , Rfl: 5 .  brimonidine-timolol (COMBIGAN) 0.2-0.5 % ophthalmic solution, Place 1 drop  into both eyes every 12 (twelve) hours., Disp: , Rfl:  .  citalopram (CELEXA) 10 MG tablet, TK 1 T PO QD, Disp: , Rfl: 2 .  etanercept (ENBREL SURECLICK) 50 MG/ML injection, INJECT CONTENTS OF 1 SYRINGE UNDER SKIN ONCE A WEEK, Disp: 12 Syringe, Rfl: 0 .  fluticasone (FLONASE) 50 MCG/ACT nasal spray, INSTILL 2 SPRAYS IN EACH NOSTRIL EVERY DAY, Disp: 16 g, Rfl: 1 .  latanoprost (XALATAN) 0.005 % ophthalmic solution, Place 1 drop into both eyes nightly., Disp: , Rfl:  .  levothyroxine (SYNTHROID, LEVOTHROID) 100 MCG tablet, TK 1 T PO QD, Disp: , Rfl: 2 .  lisinopril (PRINIVIL,ZESTRIL) 20 MG tablet, Take 1 tablet (20 mg total) by mouth 2 (two) times daily. Hold blood pressure medication if BP is less than 130/80, Disp: 60 tablet, Rfl: 11 .  loratadine (CLARITIN) 10 MG tablet, TK 1 T PO  QD., Disp: , Rfl: 1 .  methotrexate (RHEUMATREX) 2.5 MG tablet, Take 3 tablets by mouth once weekly. Caution:Chemotherapy. Protect from light. , Disp: , Rfl:  .  methotrexate (RHEUMATREX) 2.5 MG tablet, TAKE 4 TABLETS BY MOUTH 1 TIME A WEEK, Disp: 48 tablet, Rfl: 0 .  NAMZARIC 28-10 MG CP24, Take 1 capsule by mouth daily., Disp: 30 capsule, Rfl: 2 .  omeprazole (PRILOSEC) 20 MG capsule, Take 20 mg by mouth daily., Disp: , Rfl:  .  QUEtiapine (  SEROQUEL) 25 MG tablet, Take 2 tablets (50 mg total) by mouth at bedtime., Disp: 60 tablet, Rfl: 11 .  simvastatin (ZOCOR) 40 MG tablet, Take 40 mg by mouth daily., Disp: , Rfl:  .  temazepam (RESTORIL) 15 MG capsule, Take 15 mg by mouth at bedtime as needed for sleep., Disp: , Rfl:  .  traZODone (DESYREL) 50 MG tablet, TAKE 1/2 TO 1 TABLET BY MOUTH EVERY NIGHT AT BEDTIME, Disp: 30 tablet, Rfl: 0 .  folic acid (FOLVITE) 1 MG tablet, TAKE 1 TABLET(1 MG) BY MOUTH DAILY (Patient not taking: Reported on 08/13/2018), Disp: 90 tablet, Rfl: 0 .  HYDROcodone-acetaminophen (NORCO) 10-325 MG tablet, Take 1 tablet by mouth every 6 (six) hours as needed., Disp: , Rfl:    Allergies  Allergen  Reactions  . Penicillins Hives     Review of Systems  Constitutional: Negative.  Negative for fatigue.  HENT: Positive for congestion and sore throat. Negative for sinus pressure.   Eyes: Negative for visual disturbance.  Respiratory: Positive for cough (dry cough). Negative for shortness of breath.   Cardiovascular: Negative.  Negative for chest pain, palpitations and leg swelling.  Gastrointestinal: Negative.  Negative for constipation.  Endocrine: Negative.   Musculoskeletal: Negative.   Skin: Negative.   Neurological: Negative for dizziness, weakness and headaches.  Psychiatric/Behavioral: Negative for confusion. The patient is not nervous/anxious.      Today's Vitals   08/13/18 1122  BP: (!) 170/90  Pulse: 68  Temp: 98.1 F (36.7 C)  TempSrc: Oral  SpO2: 98%  Weight: 105 lb (47.6 kg)  Height: 4' 11.8" (1.519 m)   Body mass index is 20.64 kg/m.   Objective:  Physical Exam Vitals signs reviewed.  Constitutional:      General: She is not in acute distress.    Appearance: Normal appearance. She is well-developed.  HENT:     Head: Normocephalic and atraumatic.     Right Ear: Tympanic membrane, ear canal and external ear normal. There is no impacted cerumen.     Left Ear: Tympanic membrane, ear canal and external ear normal. There is no impacted cerumen.     Nose: Nose normal. No congestion.     Mouth/Throat:     Mouth: Mucous membranes are moist.  Neck:     Musculoskeletal: Normal range of motion and neck supple. No muscular tenderness.  Cardiovascular:     Rate and Rhythm: Normal rate and regular rhythm.     Pulses: Normal pulses.     Heart sounds: Normal heart sounds. No murmur.  Pulmonary:     Effort: Pulmonary effort is normal.     Breath sounds: Normal breath sounds.  Musculoskeletal:        General: Tenderness: cervical and low back.  Skin:    General: Skin is warm and dry.     Capillary Refill: Capillary refill takes less than 2 seconds.   Neurological:     General: No focal deficit present.     Mental Status: She is alert and oriented to person, place, and time.     Cranial Nerves: No cranial nerve deficit.  Psychiatric:        Mood and Affect: Mood normal.         Assessment And Plan:      1. Essential hypertension . B/P is controlled.  . CMP ordered to check renal function.  . The importance of regular exercise and dietary modification was stressed to the patient.  . Stressed importance of losing  ten percent of her body weight to help with B/P control.  . The weight loss would help with decreasing cardiac and cancer risk as well.  - lisinopril (PRINIVIL,ZESTRIL) 20 MG tablet; Take 1 tablet (20 mg total) by mouth daily. Hold blood pressure medication if BP is less than 130/80  Dispense: 90 tablet; Refill: 1 - BMP8+eGFR  2. Acquired hypothyroidism  Chronic, controlled  Continue with current medications  Instructed family to call pharmacy and have them to send a refill request for any medication refills.  dgt is on phone here with caregiver - TSH - T3 - T4, Free  3. Rash and nonspecific skin eruption  Right side of neck has pimple present she is to apply bacitracin - bacitracin ointment; Apply to affected area daily  Dispense: 30 g; Refill: 0  4. Upper respiratory tract infection, unspecified type  Otherwise symptom management with HBP [place] - doxycycline (VIBRA-TABS) 100 MG tablet; Take 1 tablet (100 mg total) by mouth 2 (two) times daily.  Dispense: 14 tablet; Refill: 0   [name], FNP

## 2018-08-14 LAB — BMP8+EGFR
BUN/Creatinine Ratio: 10 — ABNORMAL LOW (ref 12–28)
BUN: 7 mg/dL — ABNORMAL LOW (ref 8–27)
CO2: 25 mmol/L (ref 20–29)
Calcium: 9.2 mg/dL (ref 8.7–10.3)
Chloride: 104 mmol/L (ref 96–106)
Creatinine, Ser: 0.73 mg/dL (ref 0.57–1.00)
GFR calc Af Amer: 89 mL/min/{1.73_m2} (ref >59)
GFR calc non Af Amer: 77 mL/min/{1.73_m2} (ref >59)
Glucose: 71 mg/dL (ref 65–99)
Potassium: 4.3 mmol/L (ref 3.5–5.2)
Sodium: 145 mmol/L — ABNORMAL HIGH (ref 134–144)

## 2018-08-14 LAB — TSH: TSH: 0.046 u[IU]/mL — ABNORMAL LOW (ref 0.450–4.500)

## 2018-08-14 LAB — T4, FREE: Free T4: 1.5 ng/dL (ref 0.82–1.77)

## 2018-08-14 LAB — T3: T3, Total: 105 ng/dL (ref 71–180)

## 2018-08-22 ENCOUNTER — Other Ambulatory Visit: Payer: Self-pay

## 2018-08-22 NOTE — Patient Outreach (Signed)
[facility]) Care Management  08/22/2018  Jordan [name] Jul 21, 1934 [id number]   Medication Adherence call to Lesia Hausen patient did not answer patient is due on Lisinopril 20 mg. [facility] said patient last pick up was on 08/14/18 for a 90 days supply. Jordan [name] is showing past due under [facility] Ins.   Lillia Abed CPhT Pharmacy Technician [facility] Care Management Direct Dial [phone]  Fax [phone] [name]

## 2018-09-11 ENCOUNTER — Telehealth: Payer: Self-pay

## 2018-09-11 NOTE — Telephone Encounter (Signed)
Called and spoke with the patient's daughter to see if she has been having the lisinopril due to her insurance sending a letter stating she may not be taking it as directed. She stated she is taking her medication daily YRL,RMA

## 2018-10-08 ENCOUNTER — Other Ambulatory Visit: Payer: Self-pay

## 2018-10-15 ENCOUNTER — Ambulatory Visit: Payer: Self-pay | Admitting: Nurse Practitioner

## 2018-10-24 ENCOUNTER — Ambulatory Visit: Payer: Medicare Other | Admitting: Rheumatology

## 2018-10-24 ENCOUNTER — Other Ambulatory Visit: Payer: Self-pay | Admitting: Nurse Practitioner

## 2018-11-12 ENCOUNTER — Other Ambulatory Visit: Payer: Self-pay | Admitting: Rheumatology

## 2018-11-12 NOTE — Telephone Encounter (Signed)
Last Visit: 05/22/2018 Next Visit: 11/26/2018 Labs: 07/10/2018 stable  TB Gold: 04/05/2018 negative   Advised patient she is due to update labs, patient verbalized understanding but does not want to go out right now to have them drawn. Patient states she will get them drawn next month.   Okay to refill 30 day supply, per Dr. Corliss Skains.

## 2018-11-13 ENCOUNTER — Other Ambulatory Visit: Payer: Self-pay | Admitting: Rheumatology

## 2018-11-13 DIAGNOSIS — M0579 Rheumatoid arthritis with rheumatoid factor of multiple sites without organ or systems involvement: Secondary | ICD-10-CM

## 2018-11-13 NOTE — Telephone Encounter (Signed)
Last Visit: 05/22/2018 Next Visit: 11/26/2018 Labs: 07/10/2018 stable   Advised patient yesterday she is due for labs.   Okay to refill per Dr. Corliss Skains.

## 2018-11-26 ENCOUNTER — Ambulatory Visit: Payer: Medicare Other | Admitting: Rheumatology

## 2018-12-03 ENCOUNTER — Other Ambulatory Visit: Payer: Self-pay

## 2018-12-03 NOTE — Patient Outreach (Signed)
[facility]) Care Management  12/03/2018  Rashiya Rafiq 1933-08-24 [id number]   Medication Adherence call to Lesia Hausen [facility]ify spoke with patient she is due on Lisinopril 20 mg and Simvastatin 40 mg patient is taking 1 tablet daily on both medication she explain she receives help with her medication because she is blind. Mrs. Castaneda is showing past due under [facility] Ins.   Lillia Abed CPhT Pharmacy Technician [facility] Care Management Direct Dial [phone]  Fax [phone] [email or web address]

## 2018-12-07 ENCOUNTER — Other Ambulatory Visit: Payer: Self-pay | Admitting: Rheumatology

## 2018-12-07 NOTE — Telephone Encounter (Signed)
Last Visit: 05/22/2018 Next Visit: 01/04/2019 Labs: 07/11/19 stable TB Gold: 04/05/2018 negative   Left message to advise patient she is due for labs.   Okay to refill 30 day supply of Enbrel?

## 2018-12-10 NOTE — Telephone Encounter (Signed)
ok 

## 2018-12-12 ENCOUNTER — Other Ambulatory Visit: Payer: Self-pay | Admitting: Rheumatology

## 2018-12-12 DIAGNOSIS — M0579 Rheumatoid arthritis with rheumatoid factor of multiple sites without organ or systems involvement: Secondary | ICD-10-CM

## 2018-12-13 NOTE — Telephone Encounter (Signed)
Patient needs labs prior to giving her refill on methotrexate.

## 2018-12-13 NOTE — Telephone Encounter (Signed)
Last Visit:05/22/2018 Next Visit: 01/04/2019 Labs: 07/11/19 stable  Patient advised she is due for labs. Patient will she is she can get some one to bring her for labs.   Okay to refill 30 day supply MTX?

## 2018-12-19 ENCOUNTER — Encounter: Payer: Medicare Other | Admitting: Nurse Practitioner

## 2018-12-19 ENCOUNTER — Ambulatory Visit: Payer: Medicare Other

## 2018-12-21 ENCOUNTER — Telehealth: Payer: Self-pay

## 2018-12-21 NOTE — Progress Notes (Deleted)
Office Visit Note  Patient: Jordan [name]             Date of Birth: [birth year]           MRN: [id number]             PCP: Arnette Felts, FNP Referring: Arnette Felts, FNP Visit Date: 01/04/2019 Occupation: @GUAROCC @  Subjective:  No chief complaint on file.  Does she want to switch to the Enbrel mini?  She is taking Enbrel SureClick 50 mg every 7 days, methotrexate 4 tablets every 7 days, and folic acid 1 mg daily.  Last TB gold negative on 04/05/2018 and will monitor yearly.  Most recent BMP within normal limits except for slightly elevated sodium on 08/13/2018.  Most recent CBC/CMP stable on 07/10/2018.  Due for CBC/CMP today and will monitor every 3 months.  Standing orders are in place. Recommend annual influenza, Pneumovax 23, Prevnar 13, and Shingrix as indicated.  Recommend yearly skin exam.  History of Present Illness: Jordan [name] is a 85 y.o. female ***   Activities of Daily Living:  Patient reports morning stiffness for *** {minute/hour:19697}.   Patient {ACTIONS;DENIES/REPORTS:21021675::"Denies"} nocturnal pain.  Difficulty dressing/grooming: {ACTIONS;DENIES/REPORTS:21021675::"Denies"} Difficulty climbing stairs: {ACTIONS;DENIES/REPORTS:21021675::"Denies"} Difficulty getting out of chair: {ACTIONS;DENIES/REPORTS:21021675::"Denies"} Difficulty using hands for taps, buttons, cutlery, and/or writing: {ACTIONS;DENIES/REPORTS:21021675::"Denies"}  No Rheumatology ROS completed.   PMFS History:  Patient Active Problem List   Diagnosis Date Noted  . Essential hypertension 08/13/2018  . Acquired hypothyroidism 08/13/2018  . Rash and nonspecific skin eruption 08/13/2018  . Memory loss 04/12/2017  . Depression 04/12/2017  . Rheumatoid arthritis involving multiple sites with positive rheumatoid factor (HCC) 08/17/2016  . High risk medication use 08/17/2016  . H/O total knee replacement, bilateral 08/17/2016  . Spondylosis of lumbar region without myelopathy or  radiculopathy 08/17/2016  . History of glaucoma/ patient is legally blind 08/17/2016  . History of gastroesophageal reflux (GERD) 08/17/2016  . History of diverticulosis 08/17/2016  . History of hypothyroidism 08/17/2016  . history of asthmatic bronchitis 08/17/2016  . History of memory loss 08/17/2016  . History of atherosclerosis 08/17/2016    Past Medical History:  Diagnosis Date  . Allergy   . Anxiety    per pt's daughter  . Arthritis   . Asthma    pt reported  . Depression   . GERD (gastroesophageal reflux disease)   . Hearing deficit L  . High cholesterol   . Hypertension   . Osteoporosis   . Stroke [facility])     No family history on file. Past Surgical History:  Procedure Laterality Date  . ABDOMINAL HYSTERECTOMY    . REPLACEMENT TOTAL KNEE BILATERAL Bilateral   . WRIST SURGERY Left    Social History   Social History Narrative   Lives at home with grandson.   Right-handed.   2-3 cups coffee per day.    There is no immunization history on file for this patient.   Objective: Vital Signs: There were no vitals taken for this visit.   Physical Exam   Musculoskeletal Exam: ***  CDAI Exam: CDAI Score: Not documented Patient Global Assessment: Not documented; Provider Global Assessment: Not documented Swollen: Not documented; Tender: Not documented Joint Exam   Not documented   There is currently no information documented on the homunculus. Go to the Rheumatology activity and complete the homunculus joint exam.  Investigation: No additional findings.  Imaging: No results found.  Recent Labs: Lab Results  Component Value Date   WBC 7.3 07/10/2018  HGB 12.0 07/10/2018   PLT 203 07/10/2018   NA 145 (H) 08/13/2018   K 4.3 08/13/2018   CL 104 08/13/2018   CO2 25 08/13/2018   GLUCOSE 71 08/13/2018   BUN 7 (L) 08/13/2018   CREATININE 0.73 08/13/2018   BILITOT 0.5 07/10/2018   ALKPHOS 65 01/09/2017   AST 13 07/10/2018   ALT 5 (L) 07/10/2018   PROT  6.7 07/10/2018   ALBUMIN 4.0 01/09/2017   CALCIUM 9.2 08/13/2018   GFRAA 89 08/13/2018   QFTBGOLDPLUS NEGATIVE 04/05/2018    Speciality Comments: No specialty comments available.  Procedures:  No procedures performed Allergies: Penicillins   Assessment / Plan:     Visit Diagnoses: No diagnosis found.   Orders: No orders of the defined types were placed in this encounter.  No orders of the defined types were placed in this encounter.   Face-to-face time spent with patient was *** minutes. Greater than 50% of time was spent in counseling and coordination of care.  Follow-Up Instructions: No follow-ups on file.   Ellen Henri, CMA  Note - This record has been created using Animal nutritionist.  Chart creation errors have been sought, but may not always  have been located. Such creation errors do not reflect on  the standard of medical care.

## 2018-12-21 NOTE — Telephone Encounter (Signed)
I called pt to see If she is taking her lisinopril insurance sent over a letter stating she is not filling it as directed. Left v/m to call office [facility]

## 2018-12-27 ENCOUNTER — Ambulatory Visit: Payer: Medicare Other

## 2018-12-27 ENCOUNTER — Encounter: Payer: Medicare Other | Admitting: Nurse Practitioner

## 2018-12-28 ENCOUNTER — Telehealth: Payer: Self-pay | Admitting: Nurse Practitioner

## 2018-12-28 NOTE — Telephone Encounter (Signed)
I called the patient and provided her with the option to have AWV in office on 01/22/2019 or virtually with Nickeah.  She requested to have it virtually, so I scheduled it and explained that she will still come into the office on 01/22/2019 for her physical with [name]. VDM (DD)

## 2019-01-04 ENCOUNTER — Other Ambulatory Visit: Payer: Self-pay | Admitting: Nurse Practitioner

## 2019-01-04 ENCOUNTER — Ambulatory Visit: Payer: Self-pay | Admitting: Rheumatology

## 2019-01-08 ENCOUNTER — Other Ambulatory Visit: Payer: Self-pay

## 2019-01-08 ENCOUNTER — Ambulatory Visit (INDEPENDENT_AMBULATORY_CARE_PROVIDER_SITE_OTHER): Payer: Medicare Other

## 2019-01-08 VITALS — Ht 60.0 in | Wt 105.0 lb

## 2019-01-08 DIAGNOSIS — Z0000 Encounter for general adult medical examination without abnormal findings: Secondary | ICD-10-CM | POA: Diagnosis not present

## 2019-01-08 NOTE — Progress Notes (Signed)
Subjective:   Jordan [name] is a 85 y.o. female who presents for Medicare Annual (Subsequent) preventive examination.  This visit type was conducted due to national recommendations for restrictions regarding the COVID-19 Pandemic (e.g. social distancing). This format is felt to be most appropriate for this patient at this time. All issues noted in this document were discussed and addressed. No physical exam was performed (except for noted visual exam findings with Video Visits). This patient, Jordan [name] and her daughter Jordan [name], has given permission to perform this visit via telephone. Vital signs may be absent or patient reported.  Patient location:   At home  Nurse location:  [place] office    Review of Systems:  n/a Cardiac Risk Factors include: advanced age (>58men, >67 women);hypertension;sedentary lifestyle     Objective:     Vitals: Ht 5' (1.524 m) Comment: per patient  Wt 105 lb (47.6 kg) Comment: per patient  BMI 20.51 kg/m   Body mass index is 20.51 kg/m.  Advanced Directives 01/08/2019 01/16/2018  Does Patient Have a Medical Advance Directive? Yes Yes  Type of Advance Directive Healthcare Power of [place] in Chart? No - copy requested -    Tobacco Social History   Tobacco Use  Smoking Status Former Smoker  . Packs/day: 0.25  . Years: 13.00  . Pack years: 3.25  . Types: Cigarettes  . Quit date: 08/18/2006  . Years since quitting: 12.4  Smokeless Tobacco Never Used     Counseling given: Not Answered   Clinical Intake:  Pre-visit preparation completed: Yes  Pain : No/denies pain Pain Score: 0-No pain     Nutritional Status: BMI of 19-24  Normal Nutritional Risks: None Diabetes: No  How often do you need to have someone help you when you read instructions, pamphlets, or other written materials from your doctor or pharmacy?: 1 - Never What is the last grade level  you completed in school?: 12th grade  Interpreter Needed?: No  Information entered by :: NAllen LPN  Past Medical History:  Diagnosis Date  . Allergy   . Anxiety    per pt's daughter  . Arthritis   . Asthma    pt reported  . Depression   . GERD (gastroesophageal reflux disease)   . Hearing deficit L  . High cholesterol   . Hypertension   . Osteoporosis   . Stroke [facility])    Past Surgical History:  Procedure Laterality Date  . ABDOMINAL HYSTERECTOMY    . REPLACEMENT TOTAL KNEE BILATERAL Bilateral   . WRIST SURGERY Left    History reviewed. No pertinent family history. Social History   Socioeconomic History  . Marital status: Single    Spouse name: Not on file  . Number of children: 2  . Years of education: 8th  . Highest education level: Not on file  Occupational History  . Occupation: Retired  Scientific laboratory technician  . Financial resource strain: Not on file  . Food insecurity    Worry: Never true    Inability: Never true  . Transportation needs    Medical: No    Non-medical: No  Tobacco Use  . Smoking status: Former Smoker    Packs/day: 0.25    Years: 13.00    Pack years: 3.25    Types: Cigarettes    Quit date: 08/18/2006    Years since quitting: 12.4  . Smokeless tobacco: Never Used  Substance and Sexual  Activity  . Alcohol use: No  . Drug use: Yes    Types: Hydrocodone  . Sexual activity: Not Currently  Lifestyle  . Physical activity    Days per week: 0 days    Minutes per session: 0 min  . Stress: Not at all  Relationships  . Social Musician on phone: Not on file    Gets together: Not on file    Attends religious service: Not on file    Active member of club or organization: Not on file    Attends meetings of clubs or organizations: Not on file    Relationship status: Not on file  Other Topics Concern  . Not on file  Social History Narrative   Lives at home with grandson.   Right-handed.   2-3 cups coffee per day.    Outpatient  Encounter Medications as of 01/08/2019  Medication Sig  . albuterol (PROVENTIL HFA;VENTOLIN HFA) 108 (90 Base) MCG/ACT inhaler Inhale 1 puff into the lungs every 6 (six) hours as needed for wheezing or shortness of breath.  [name] atropine 1 % ophthalmic solution INSTILL 1 GTT INTO OS D  . bacitracin ointment Apply to affected area daily  . brimonidine-timolol (COMBIGAN) 0.2-0.5 % ophthalmic solution Place 1 drop into both eyes every 12 (twelve) hours.  . citalopram (CELEXA) 10 MG tablet TK 1 T PO QD  . doxycycline (VIBRA-TABS) 100 MG tablet Take 1 tablet (100 mg total) by mouth 2 (two) times daily.  [name] etanercept (ENBREL SURECLICK) 50 MG/ML injection INJECT CONTENTS OF 1 SYRINGE UNDER THE SKIN ONCE A WEEK  . fluticasone (FLONASE) 50 MCG/ACT nasal spray INSTILL 2 SPRAYS IN EACH NOSTRIL EVERY DAY  . HYDROcodone-acetaminophen (NORCO) 10-325 MG tablet Take 1 tablet by mouth every 6 (six) hours as needed.  . latanoprost (XALATAN) 0.005 % ophthalmic solution Place 1 drop into both eyes nightly.  . levothyroxine (SYNTHROID, LEVOTHROID) 100 MCG tablet TK 1 T PO QD  . lisinopril (PRINIVIL,ZESTRIL) 20 MG tablet Take 1 tablet (20 mg total) by mouth daily. Hold blood pressure medication if BP is less than 130/80  . loratadine (CLARITIN) 10 MG tablet TAKE 1 TABLET BY MOUTH EVERY DAY  . methotrexate (RHEUMATREX) 2.5 MG tablet Take 3 tablets by mouth once weekly. Caution:Chemotherapy. Protect from light.   . methotrexate (RHEUMATREX) 2.5 MG tablet TAKE 4 TABLETS BY MOUTH 1 TIME PER WEEK  . NAMZARIC 28-10 MG CP24 Take 1 capsule by mouth daily.  [name] omeprazole (PRILOSEC) 20 MG capsule TAKE 1 CAPSULE BY MOUTH EVERY DAY  . QUEtiapine (SEROQUEL) 25 MG tablet Take 2 tablets (50 mg total) by mouth at bedtime.  . simvastatin (ZOCOR) 40 MG tablet Take 40 mg by mouth daily.  . temazepam (RESTORIL) 15 MG capsule Take 15 mg by mouth at bedtime as needed for sleep.  . traZODone (DESYREL) 50 MG tablet TAKE 1/2 TO 1 TABLET BY MOUTH  EVERY NIGHT AT BEDTIME  . folic acid (FOLVITE) 1 MG tablet TAKE 1 TABLET(1 MG) BY MOUTH DAILY (Patient not taking: Reported on 08/13/2018)   No facility-administered encounter medications on file as of 01/08/2019.     Activities of Daily Living In your present state of health, do you have any difficulty performing the following activities: 01/08/2019  Hearing? N  Vision? Y  Comment legally blind  Difficulty concentrating or making decisions? Y  Comment some forgetfulness  Walking or climbing stairs? Y  Dressing or bathing? Y  Comment aide helps with bathing  Doing errands, shopping? Y  Comment someone is with her  Preparing Food and eating ? Y  Comment meals on wheels  Using the Toilet? N  In the past six months, have you accidently leaked urine? N  Do you have problems with loss of bowel control? N  Managing your Medications? Y  Comment someone sets up  Managing your Finances? Y  Housekeeping or managing your Housekeeping? Y  Some recent data might be hidden    Patient Care Team: Arnette FeltsMoore, [name], FNP as PCP - General (General Practice)    Assessment:   This is a routine wellness examination for Jordan [name].  Exercise Activities and Dietary recommendations Current Exercise Habits: The patient does not participate in regular exercise at present  Goals    . Patient Stated     No goals       Fall Risk Fall Risk  01/08/2019 08/13/2018  Falls in the past year? 1 1  Number falls in past yr: 0 1  Comment tripped over something -  Injury with Fall? - 1  Risk for fall due to : Medication side effect;Impaired balance/gait;Impaired vision -  Follow up Falls prevention discussed -   Is the patient's home free of loose throw rugs in walkways, pet beds, electrical cords, etc?   yes      Grab bars in the bathroom? yes      Handrails on the stairs?   no      Adequate lighting?   yes  Timed Get Up and Go performed: n/a  Depression Screen PHQ 2/9 Scores 01/08/2019 08/13/2018  PHQ - 2  Score 0 0     Cognitive Function MMSE - Mini Mental State Exam 07/13/2017  Orientation to time 3  Orientation to Place 5  Registration 3  Attention/ Calculation 5  Recall 0  Language- name 2 objects 2  Language- repeat 1  Language- follow 3 step command 3  Language- read & follow direction 1  Write a sentence 1  Copy design 1  Total score 25     6CIT Screen 01/08/2019  What Year? 0 points  What month? 0 points  What time? 3 points  Count back from 20 2 points  Months in reverse 4 points  Repeat phrase 10 points  Total Score 19     There is no immunization history on file for this patient.  Qualifies for Shingles Vaccine? yes  Screening Tests Health Maintenance  Topic Date Due  . TETANUS/TDAP  11/29/1954  . PNA vac Low Risk Adult (1 of 2 - PCV13) 11/28/2000  . INFLUENZA VACCINE  02/23/2019  . DEXA SCAN  Completed    Cancer Screenings: Lung: Low Dose CT Chest recommended if Age 28-80 years, 30 pack-year currently smoking OR have quit w/in 15years. Patient does not qualify. Breast:  Up to date on Mammogram? Yes   Up to date of Bone Density/Dexa? Yes Colorectal: not required  Additional Screenings: : Hepatitis C Screening: n/a     Plan:    No goals. Thinks had pneumonia vaccines.   I have personally reviewed and noted the following in the patient's chart:   . Medical and social history . Use of alcohol, tobacco or illicit drugs  . Current medications and supplements . Functional ability and status . Nutritional status . Physical activity . Advanced directives . List of other physicians . Hospitalizations, surgeries, and ER visits in previous 12 months . Vitals . Screenings to include cognitive, depression, and falls . Referrals and  appointments  In addition, I have reviewed and discussed with patient certain preventive protocols, quality metrics, and best practice recommendations. A written personalized care plan for preventive services as well as  general preventive health recommendations were provided to patient.     Barb Merinoickeah E Naturi Alarid, LPN  1/[id number]/82956/16/2020

## 2019-01-08 NOTE — Patient Instructions (Signed)
Jordan [name] , Thank you for taking time to come for your Medicare Wellness Visit. I appreciate your ongoing commitment to your health goals. Please review the following plan we discussed and let me know if I can assist you in the future.   Screening recommendations/referrals: Colonoscopy: not required Mammogram: not required Bone Density: 05/2017 Recommended yearly ophthalmology/optometry visit for glaucoma screening and checkup Recommended yearly dental visit for hygiene and checkup  Vaccinations: Influenza vaccine: doesn't remember Pneumococcal vaccine: doesn't remember Tdap vaccine: due Shingles vaccine: 3 years ago per daughter    Advanced directives: Please bring a copy of your POA (Power of Attorney) and/or Living Will to your next appointment.    Conditions/risks identified: blind  Next appointment: 01/22/2019 at 9:00   Preventive Care 39 Years and Older, Female Preventive care refers to lifestyle choices and visits with your health care provider that can promote health and wellness. What does preventive care include?  A yearly physical exam. This is also called an annual well check.  Dental exams once or twice a year.  Routine eye exams. Ask your health care provider how often you should have your eyes checked.  Personal lifestyle choices, including:  Daily care of your teeth and gums.  Regular physical activity.  Eating a healthy diet.  Avoiding tobacco and drug use.  Limiting alcohol use.  Practicing safe sex.  Taking low-dose aspirin every day.  Taking vitamin and mineral supplements as recommended by your health care provider. What happens during an annual well check? The services and screenings done by your health care provider during your annual well check will depend on your age, overall health, lifestyle risk factors, and family history of disease. Counseling  Your health care provider may ask you questions about your:  Alcohol use.  Tobacco  use.  Drug use.  Emotional well-being.  Home and relationship well-being.  Sexual activity.  Eating habits.  History of falls.  Memory and ability to understand (cognition).  Work and work Statistician.  Reproductive health. Screening  You may have the following tests or measurements:  Height, weight, and BMI.  Blood pressure.  Lipid and cholesterol levels. These may be checked every 5 years, or more frequently if you are over 33 years old.  Skin check.  Lung cancer screening. You may have this screening every year starting at age 57 if you have a 30-pack-year history of smoking and currently smoke or have quit within the past 15 years.  Fecal occult blood test (FOBT) of the stool. You may have this test every year starting at age 61.  Flexible sigmoidoscopy or colonoscopy. You may have a sigmoidoscopy every 5 years or a colonoscopy every 10 years starting at age 15.  Hepatitis C blood test.  Hepatitis B blood test.  Sexually transmitted disease (STD) testing.  Diabetes screening. This is done by checking your blood sugar (glucose) after you have not eaten for a while (fasting). You may have this done every 1-3 years.  Bone density scan. This is done to screen for osteoporosis. You may have this done starting at age 40.  Mammogram. This may be done every 1-2 years. Talk to your health care provider about how often you should have regular mammograms. Talk with your health care provider about your test results, treatment options, and if necessary, the need for more tests. Vaccines  Your health care provider may recommend certain vaccines, such as:  Influenza vaccine. This is recommended every year.  Tetanus, diphtheria, and acellular pertussis (Tdap,  Td) vaccine. You may need a Td booster every 10 years.  Zoster vaccine. You may need this after age 15.  Pneumococcal 13-valent conjugate (PCV13) vaccine. One dose is recommended after age 73.  Pneumococcal  polysaccharide (PPSV23) vaccine. One dose is recommended after age 62. Talk to your health care provider about which screenings and vaccines you need and how often you need them. This information is not intended to replace advice given to you by your health care provider. Make sure you discuss any questions you have with your health care provider. Document Released: 08/07/2015 Document Revised: 03/30/2016 Document Reviewed: 05/12/2015 Elsevier Interactive Patient Education  2017 [employer] Prevention in the Home Falls can cause injuries. They can happen to people of all ages. There are many things you can do to make your home safe and to help prevent falls. What can I do on the outside of my home?  Regularly fix the edges of walkways and driveways and fix any cracks.  Remove anything that might make you trip as you walk through a door, such as a raised step or threshold.  Trim any bushes or trees on the path to your home.  Use bright outdoor lighting.  Clear any walking paths of anything that might make someone trip, such as rocks or tools.  Regularly check to see if handrails are loose or broken. Make sure that both sides of any steps have handrails.  Any raised decks and porches should have guardrails on the edges.  Have any leaves, snow, or ice cleared regularly.  Use sand or salt on walking paths during winter.  Clean up any spills in your garage right away. This includes oil or grease spills. What can I do in the bathroom?  Use night lights.  Install grab bars by the toilet and in the tub and shower. Do not use towel bars as grab bars.  Use non-skid mats or decals in the tub or shower.  If you need to sit down in the shower, use a plastic, non-slip stool.  Keep the floor dry. Clean up any water that spills on the floor as soon as it happens.  Remove soap buildup in the tub or shower regularly.  Attach bath mats securely with double-sided non-slip rug tape.   Do not have throw rugs and other things on the floor that can make you trip. What can I do in the bedroom?  Use night lights.  Make sure that you have a light by your bed that is easy to reach.  Do not use any sheets or blankets that are too big for your bed. They should not hang down onto the floor.  Have a firm chair that has side arms. You can use this for support while you get dressed.  Do not have throw rugs and other things on the floor that can make you trip. What can I do in the kitchen?  Clean up any spills right away.  Avoid walking on wet floors.  Keep items that you use a lot in easy-to-reach places.  If you need to reach something above you, use a strong step stool that has a grab bar.  Keep electrical cords out of the way.  Do not use floor polish or wax that makes floors slippery. If you must use wax, use non-skid floor wax.  Do not have throw rugs and other things on the floor that can make you trip. What can I do with my stairs?  Do not  leave any items on the stairs.  Make sure that there are handrails on both sides of the stairs and use them. Fix handrails that are broken or loose. Make sure that handrails are as long as the stairways.  Check any carpeting to make sure that it is firmly attached to the stairs. Fix any carpet that is loose or worn.  Avoid having throw rugs at the top or bottom of the stairs. If you do have throw rugs, attach them to the floor with carpet tape.  Make sure that you have a light switch at the top of the stairs and the bottom of the stairs. If you do not have them, ask someone to add them for you. What else can I do to help prevent falls?  Wear shoes that:  Do not have high heels.  Have rubber bottoms.  Are comfortable and fit you well.  Are closed at the toe. Do not wear sandals.  If you use a stepladder:  Make sure that it is fully opened. Do not climb a closed stepladder.  Make sure that both sides of the  stepladder are locked into place.  Ask someone to hold it for you, if possible.  Clearly mark and make sure that you can see:  Any grab bars or handrails.  First and last steps.  Where the edge of each step is.  Use tools that help you move around (mobility aids) if they are needed. These include:  Canes.  Walkers.  Scooters.  Crutches.  Turn on the lights when you go into a dark area. Replace any light bulbs as soon as they burn out.  Set up your furniture so you have a clear path. Avoid moving your furniture around.  If any of your floors are uneven, fix them.  If there are any pets around you, be aware of where they are.  Review your medicines with your doctor. Some medicines can make you feel dizzy. This can increase your chance of falling. Ask your doctor what other things that you can do to help prevent falls. This information is not intended to replace advice given to you by your health care provider. Make sure you discuss any questions you have with your health care provider. Document Released: 05/07/2009 Document Revised: 12/17/2015 Document Reviewed: 08/15/2014 Elsevier Interactive Patient Education  2017 [employer].

## 2019-01-11 ENCOUNTER — Telehealth: Payer: Self-pay | Admitting: Rheumatology

## 2019-01-11 NOTE — Telephone Encounter (Signed)
Spoke with [name] and advised patient will need to update labs prior to medication refill. Patient is [place] due for a follow up visit. [name] will check with family and call back to schedule a virtual visit. She states she will call to have caregiver bring patient for labs today or Monday.

## 2019-01-11 NOTE — Telephone Encounter (Signed)
Patient's daughter [name] called requesting prescription refill of Enbrel and Methotrexate to be sent to [facility] at [address].   Patient is out of medication and due to take her injection today.

## 2019-01-22 ENCOUNTER — Encounter: Payer: Medicare Other | Admitting: Nurse Practitioner

## 2019-01-22 ENCOUNTER — Ambulatory Visit: Payer: Medicare Other

## 2019-01-29 DIAGNOSIS — D899 Disorder involving the immune mechanism, unspecified: Secondary | ICD-10-CM | POA: Diagnosis not present

## 2019-02-05 ENCOUNTER — Encounter: Payer: Medicare Other | Admitting: Nurse Practitioner

## 2019-02-14 ENCOUNTER — Other Ambulatory Visit: Payer: Self-pay

## 2019-02-14 ENCOUNTER — Telehealth: Payer: Self-pay | Admitting: Rheumatology

## 2019-02-14 DIAGNOSIS — H44512 Absolute glaucoma, left eye: Secondary | ICD-10-CM | POA: Diagnosis not present

## 2019-02-14 DIAGNOSIS — H401113 Primary open-angle glaucoma, right eye, severe stage: Secondary | ICD-10-CM | POA: Diagnosis not present

## 2019-02-14 DIAGNOSIS — Z79899 Other long term (current) drug therapy: Secondary | ICD-10-CM

## 2019-02-14 MED ORDER — METHOTREXATE 2.5 MG PO TABS: 7.5000 mg | ORAL_TABLET | ORAL | 0 refills | Status: DC

## 2019-02-14 MED ORDER — ENBREL SURECLICK 50 MG/ML ~~LOC~~ SOAJ: SUBCUTANEOUS | 0 refills | Status: DC

## 2019-02-14 NOTE — Telephone Encounter (Signed)
Patient's daughter requesting refill on patient's MTX, and Enbrel sent to [facility] on [address]. Patient's daughter wanting to pick it up this pm while they are out, if possible.

## 2019-02-14 NOTE — Telephone Encounter (Signed)
Last Visit:05/22/2018 Next Visit: patient and family aware she is due for a follow up . Patient's daughter is supposed to call to schedule  Labs: 07/10/18 (stable patient updated today) TB Gold: 04/05/18   Okay to refill MTX and Enbrel per Hazel Sams, PA-C

## 2019-02-15 LAB — COMPLETE METABOLIC PANEL WITH GFR
AG Ratio: 1 (calc) (ref 1.0–2.5)
ALT: 5 U/L — ABNORMAL LOW (ref 6–29)
AST: 10 U/L (ref 10–35)
Albumin: 3.2 g/dL — ABNORMAL LOW (ref 3.6–5.1)
Alkaline phosphatase (APISO): 61 U/L (ref 37–153)
BUN/Creatinine Ratio: 13 (calc) (ref 6–22)
BUN: 12 mg/dL (ref 7–25)
CO2: 27 mmol/L (ref 20–32)
Calcium: 8.4 mg/dL — ABNORMAL LOW (ref 8.6–10.4)
Chloride: 97 mmol/L — ABNORMAL LOW (ref 98–110)
Creat: 0.93 mg/dL — ABNORMAL HIGH (ref 0.60–0.88)
GFR, Est African American: 66 mL/min/{1.73_m2} (ref >=60)
GFR, Est Non African American: 57 mL/min/{1.73_m2} — ABNORMAL LOW (ref >=60)
Globulin: 3.2 g/dL (calc) (ref 1.9–3.7)
Glucose, Bld: 83 mg/dL (ref 65–99)
Potassium: 4.4 mmol/L (ref 3.5–5.3)
Sodium: 137 mmol/L (ref 135–146)
Total Bilirubin: 0.3 mg/dL (ref 0.2–1.2)
Total Protein: 6.4 g/dL (ref 6.1–8.1)

## 2019-02-15 LAB — CBC WITH DIFFERENTIAL/PLATELET
Absolute Monocytes: 928 cells/uL (ref 200–950)
Basophils Absolute: 47 cells/uL (ref 0–200)
Basophils Relative: 0.6 %
Eosinophils Absolute: 31 cells/uL (ref 15–500)
Eosinophils Relative: 0.4 %
HCT: 35.1 % (ref 35.0–45.0)
Hemoglobin: 11.4 g/dL — ABNORMAL LOW (ref 11.7–15.5)
Lymphs Abs: 2324 cells/uL (ref 850–3900)
MCH: 24.2 pg — ABNORMAL LOW (ref 27.0–33.0)
MCHC: 32.5 g/dL (ref 32.0–36.0)
MCV: 74.4 fL — ABNORMAL LOW (ref 80.0–100.0)
MPV: 12.3 fL (ref 7.5–12.5)
Monocytes Relative: 11.9 %
Neutro Abs: 4469 cells/uL (ref 1500–7800)
Neutrophils Relative %: 57.3 %
Platelets: 353 10*3/uL (ref 140–400)
RBC: 4.72 10*6/uL (ref 3.80–5.10)
RDW: 16.2 % — ABNORMAL HIGH (ref 11.0–15.0)
Total Lymphocyte: 29.8 %
WBC: 7.8 10*3/uL (ref 3.8–10.8)

## 2019-02-18 ENCOUNTER — Telehealth: Payer: Self-pay | Admitting: *Deleted

## 2019-02-18 NOTE — Progress Notes (Signed)
Patient was clinically doing very well.  She has low creatinine.  Please advise her to discontinue methotrexate.

## 2019-02-18 NOTE — Telephone Encounter (Signed)
-----   Message from Bo Merino, MD sent at 02/18/2019 12:48 PM EDT ----- Patient was clinically doing very well.  She has low creatinine.  Please advise her to discontinue methotrexate.

## 2019-02-20 ENCOUNTER — Ambulatory Visit (INDEPENDENT_AMBULATORY_CARE_PROVIDER_SITE_OTHER): Payer: Medicare Other | Admitting: Nurse Practitioner

## 2019-02-20 ENCOUNTER — Other Ambulatory Visit: Payer: Self-pay

## 2019-02-20 ENCOUNTER — Encounter: Payer: Self-pay | Admitting: Nurse Practitioner

## 2019-02-20 VITALS — BP 98/56 | HR 59 | Temp 98.1°F | Ht 60.2 in | Wt 95.0 lb

## 2019-02-20 DIAGNOSIS — G4700 Insomnia, unspecified: Secondary | ICD-10-CM

## 2019-02-20 DIAGNOSIS — Z0000 Encounter for general adult medical examination without abnormal findings: Secondary | ICD-10-CM | POA: Diagnosis not present

## 2019-02-20 DIAGNOSIS — E559 Vitamin D deficiency, unspecified: Secondary | ICD-10-CM | POA: Diagnosis not present

## 2019-02-20 DIAGNOSIS — E039 Hypothyroidism, unspecified: Secondary | ICD-10-CM

## 2019-02-20 DIAGNOSIS — I10 Essential (primary) hypertension: Secondary | ICD-10-CM | POA: Diagnosis not present

## 2019-02-20 MED ORDER — MIRTAZAPINE 15 MG PO TABS: 15.0000 mg | ORAL_TABLET | Freq: Every day | ORAL | 2 refills | Status: DC

## 2019-02-20 MED ORDER — LORATADINE 10 MG PO TABS: 10.0000 mg | ORAL_TABLET | Freq: Every day | ORAL | 0 refills | Status: DC

## 2019-02-20 MED ORDER — TRAZODONE HCL 50 MG PO TABS: ORAL_TABLET | ORAL | 0 refills | Status: DC

## 2019-02-20 MED ORDER — NAMZARIC 28-10 MG PO CP24: 1.0000 | ORAL_CAPSULE | Freq: Every day | ORAL | 0 refills | Status: DC

## 2019-02-20 NOTE — Progress Notes (Addendum)
Subjective:     Patient ID: Jordan [name] , female    DOB: 04/29/1934 , 85 y.o.   MRN: [id number]   Chief Complaint  Patient presents with  . Annual Exam   The patient states she uses status post hysterectomy for birth control. Last LMP was No LMP recorded. Patient has had a hysterectomy.. Negative for Dysmenorrhea and Negative for Menorrhagia No longer getting Mammograms.  Negative for: breast discharge, breast lump(s), breast pain and breast self exam.  Pertinent negatives include abnormal bleeding (hematology), anxiety, decreased libido, depression, difficulty falling sleep, dyspareunia, history of infertility, nocturia, sexual dysfunction, sleep disturbances, urinary incontinence, urinary urgency, vaginal discharge and vaginal itching. Diet regular. She has a low appetite.  The patient states her exercise level is  none.      The patient's tobacco use is:  Social History   Tobacco Use  Smoking Status Former Smoker  . Packs/day: 0.25  . Years: 13.00  . Pack years: 3.25  . Types: Cigarettes  . Quit date: 08/18/2006  . Years since quitting: 12.5  Smokeless Tobacco Never Used   She has been exposed to passive smoke. The patient's alcohol use is:  Social History   Substance and Sexual Activity  Alcohol Use No   HPI  Here for HM.  Has seen the opthamologist last week. She has also seen Dr. Vertis Kelcheveswhar  Wt Readings from Last 3 Encounters: 02/20/19 : 95 lb (43.1 kg) 01/08/19 : 105 lb (47.6 kg) 08/13/18 : 105 lb (47.6 kg)   Thyroid Problem Presents for follow-up visit. Patient reports no anxiety or fatigue. The symptoms have been stable.     Past Medical History:  Diagnosis Date  . Allergy   . Anxiety    per pt's daughter  . Arthritis   . Asthma    pt reported  . Depression   . GERD (gastroesophageal reflux disease)   . Hearing deficit L  . High cholesterol   . Hypertension   . Osteoporosis   . Stroke [facility])      No family history on file.   Current Outpatient  Medications:  .  albuterol (PROVENTIL HFA;VENTOLIN HFA) 108 (90 Base) MCG/ACT inhaler, Inhale 1 puff into the lungs every 6 (six) hours as needed for wheezing or shortness of breath., Disp: , Rfl:  .  atropine 1 % ophthalmic solution, INSTILL 1 GTT INTO OS D, Disp: , Rfl: 5 .  bacitracin ointment, Apply to affected area daily, Disp: 30 g, Rfl: 0 .  brimonidine-timolol (COMBIGAN) 0.2-0.5 % ophthalmic solution, Place 1 drop into both eyes every 12 (twelve) hours., Disp: , Rfl:  .  citalopram (CELEXA) 10 MG tablet, TK 1 T PO QD, Disp: , Rfl: 2 .  doxycycline (VIBRA-TABS) 100 MG tablet, Take 1 tablet (100 mg total) by mouth 2 (two) times daily., Disp: 14 tablet, Rfl: 0 .  etanercept (ENBREL SURECLICK) 50 MG/ML injection, INJECT CONTENTS OF 1 SYRINGE UNDER THE SKIN ONCE A WEEK, Disp: 4 mL, Rfl: 0 .  fluticasone (FLONASE) 50 MCG/ACT nasal spray, INSTILL 2 SPRAYS IN EACH NOSTRIL EVERY DAY, Disp: 16 g, Rfl: 1 .  folic acid (FOLVITE) 1 MG tablet, TAKE 1 TABLET(1 MG) BY MOUTH DAILY, Disp: 90 tablet, Rfl: 0 .  HYDROcodone-acetaminophen (NORCO) 10-325 MG tablet, Take 1 tablet by mouth every 6 (six) hours as needed., Disp: , Rfl:  .  latanoprost (XALATAN) 0.005 % ophthalmic solution, Place 1 drop into both eyes nightly., Disp: , Rfl:  .  levothyroxine (SYNTHROID, LEVOTHROID)  100 MCG tablet, TK 1 T PO QD, Disp: , Rfl: 2 .  lisinopril (PRINIVIL,ZESTRIL) 20 MG tablet, Take 1 tablet (20 mg total) by mouth daily. Hold blood pressure medication if BP is less than 130/80, Disp: 90 tablet, Rfl: 1 .  loratadine (CLARITIN) 10 MG tablet, TAKE 1 TABLET BY MOUTH EVERY DAY, Disp: 90 tablet, Rfl: 0 .  NAMZARIC 28-10 MG CP24, Take 1 capsule by mouth daily., Disp: 30 capsule, Rfl: 2 .  omeprazole (PRILOSEC) 20 MG capsule, TAKE 1 CAPSULE BY MOUTH EVERY DAY, Disp: 90 capsule, Rfl: 0 .  QUEtiapine (SEROQUEL) 25 MG tablet, Take 2 tablets (50 mg total) by mouth at bedtime., Disp: 60 tablet, Rfl: 11 .  simvastatin (ZOCOR) 40 MG  tablet, Take 40 mg by mouth daily., Disp: , Rfl:  .  temazepam (RESTORIL) 15 MG capsule, Take 15 mg by mouth at bedtime as needed for sleep., Disp: , Rfl:  .  traZODone (DESYREL) 50 MG tablet, TAKE 1/2 TO 1 TABLET BY MOUTH EVERY NIGHT AT BEDTIME, Disp: 30 tablet, Rfl: 0   Allergies  Allergen Reactions  . Penicillins Hives     Review of Systems  Constitutional: Negative for fatigue and fever.  HENT: Negative.   Eyes: Negative.   Respiratory: Negative.   Cardiovascular: Negative.   Gastrointestinal: Negative.   Endocrine: Negative.   Genitourinary: Negative.   Musculoskeletal: Negative.   Skin: Negative.   Allergic/Immunologic: Negative.   Neurological: Negative.   Hematological: Negative.   Psychiatric/Behavioral: Negative.  The patient is not nervous/anxious.      Today's Vitals   02/20/19 1523  BP: (!) 98/56  Pulse: (!) 59  Temp: 98.1 F (36.7 C)  TempSrc: Oral  Weight: 95 lb (43.1 kg)  Height: 5' 0.2" (1.529 m)  PainSc: 0-No pain   Body mass index is 18.43 kg/m.   Objective:  Physical Exam Vitals signs reviewed.  Constitutional:      Appearance: Normal appearance. She is well-developed.  HENT:     Head: Normocephalic and atraumatic.     Right Ear: Hearing, tympanic membrane, ear canal and external ear normal.     Left Ear: Hearing, tympanic membrane, ear canal and external ear normal.     Nose: Nose normal.     Mouth/Throat:     Mouth: Mucous membranes are moist.  Eyes:     General: Lids are normal.     Conjunctiva/sclera: Conjunctivae normal.     Pupils: Pupils are equal, round, and reactive to light.     Funduscopic exam:    Right eye: No papilledema.        Left eye: No papilledema.  Neck:     Musculoskeletal: Full passive range of motion without pain, normal range of motion and neck supple.     Thyroid: No thyroid mass.     Vascular: No carotid bruit.  Cardiovascular:     Rate and Rhythm: Normal rate and regular rhythm.     Pulses: Normal pulses.      Heart sounds: Normal heart sounds. No murmur.  Pulmonary:     Effort: Pulmonary effort is normal.     Breath sounds: Normal breath sounds.  Chest:     Breasts:        Right: Normal. No mass, nipple discharge or tenderness.        Left: Normal. No mass, nipple discharge or tenderness.  Abdominal:     General: Abdomen is flat. Bowel sounds are normal.     Palpations: Abdomen  is soft.  Musculoskeletal: Normal range of motion.        General: No swelling.     Right lower leg: No edema.     Left lower leg: No edema.  Skin:    General: Skin is warm and dry.     Capillary Refill: Capillary refill takes less than 2 seconds.  Neurological:     General: No focal deficit present.     Mental Status: She is alert and oriented to person, place, and time.     Cranial Nerves: No cranial nerve deficit.     Sensory: No sensory deficit.  Psychiatric:        Mood and Affect: Mood normal.        Behavior: Behavior normal.        Thought Content: Thought content normal.        Judgment: Judgment normal.         Assessment And Plan:     1. Health maintenance examination . Behavior modifications discussed and diet history reviewed.   . Pt will continue to exercise regularly and modify diet with low GI, plant based foods and decrease intake of processed foods.  . Recommend intake of daily multivitamin, Vitamin D, and calcium.  . Recommend  for preventive screenings, as well as recommend immunizations that include influenza, TDAP  2. Essential hypertension . B/P is controlled.  . CMP ordered to check renal function.  . The importance of regular exercise and dietary modification was stressed to the patient.  . Stressed importance of losing ten percent of her body weight to help with B/P control.  . The weight loss would help with decreasing cardiac and cancer risk as well.  . EKG done Sinus bradycardia HR 54 - POCT Urinalysis Dipstick (81002) - POCT UA - Microalbumin - EKG 12-Lead  3.  Acquired hypothyroidism  Chronic, uncontrolled  She has not been taking her medication as she is supposed to take. Her levels were over productive last visit  Her daughter is here today with her and we are going to get her set up with CCM  Continue with current medications  4. Insomnia, unspecified type  Will change to mirtazapine to help with appetite however this may be related to her overproducing thyroid  5. Vitamin D deficiency  Will check vitamin D level and supplement as needed.     Also encouraged to spend 15 minutes in the sun daily.     [name], FNP    THE PATIENT IS ENCOURAGED TO PRACTICE SOCIAL DISTANCING DUE TO THE COVID-19 PANDEMIC.

## 2019-02-20 NOTE — Patient Instructions (Addendum)
STOP TRAZADONE AND CITALOPRAM  SHE WILL START MIRTZAPINE  REMOVE THE SYNTHROID 100 MG FROM YOUR PILL BOX  Health Maintenance  Topic Date Due  . TETANUS/TDAP  [birth year]/29/1954  . PNA vac Low Risk Adult (1 of 2 - PCV13) 11/28/2000  . INFLUENZA VACCINE  02/23/2019  . DEXA SCAN  Completed    Health Maintenance, Female Adopting a healthy lifestyle and getting preventive care are important in promoting health and wellness. Ask your health care provider about:  The right schedule for you to have regular tests and exams.  Things you can do on your own to prevent diseases and keep yourself healthy. What should I know about diet, weight, and exercise? Eat a healthy diet   Eat a diet that includes plenty of vegetables, fruits, low-fat dairy products, and lean protein.  Do not eat a lot of foods that are high in solid fats, added sugars, or sodium. Maintain a healthy weight Body mass index (BMI) is used to identify weight problems. It estimates body fat based on height and weight. Your health care provider can help determine your BMI and help you achieve or maintain a healthy weight. Get regular exercise Get regular exercise. This is one of the most important things you can do for your health. Most adults should:  Exercise for at least 150 minutes each week. The exercise should increase your heart rate and make you sweat (moderate-intensity exercise).  Do strengthening exercises at least twice a week. This is in addition to the moderate-intensity exercise.  Spend less time sitting. Even light physical activity can be beneficial. Watch cholesterol and blood lipids Have your blood tested for lipids and cholesterol at 83 years of age, then have this test every 5 years. Have your cholesterol levels checked more often if:  Your lipid or cholesterol levels are high.  You are older than 83 years of age.  You are at high risk for heart disease. What should I know about cancer  screening? Depending on your health history and family history, you may need to have cancer screening at various ages. This may include screening for:  Breast cancer.  Cervical cancer.  Colorectal cancer.  Skin cancer.  Lung cancer. What should I know about heart disease, diabetes, and high blood pressure? Blood pressure and heart disease  High blood pressure causes heart disease and increases the risk of stroke. This is more likely to develop in people who have high blood pressure readings, are of African descent, or are overweight.  Have your blood pressure checked: ? Every 3-5 years if you are 74-26 years of age. ? Every year if you are 27 years old or older. Diabetes Have regular diabetes screenings. This checks your fasting blood sugar level. Have the screening done:  Once every three years after age 40 if you are at a normal weight and have a low risk for diabetes.  More often and at a younger age if you are overweight or have a high risk for diabetes. What should I know about preventing infection? Hepatitis B If you have a higher risk for hepatitis B, you should be screened for this virus. Talk with your health care provider to find out if you are at risk for hepatitis B infection. Hepatitis C Testing is recommended for:  Everyone born from [birth year] through 1963.  Anyone with known risk factors for hepatitis C. Sexually transmitted infections (STIs)  Get screened for STIs, including gonorrhea and chlamydia, if: ? You are sexually active and  are younger than 83 years of age. ? You are older than 83 years of age and your health care provider tells you that you are at risk for this type of infection. ? Your sexual activity has changed since you were last screened, and you are at increased risk for chlamydia or gonorrhea. Ask your health care provider if you are at risk.  Ask your health care provider about whether you are at high risk for HIV. Your health care provider may  recommend a prescription medicine to help prevent HIV infection. If you choose to take medicine to prevent HIV, you should first get tested for HIV. You should then be tested every 3 months for as long as you are taking the medicine. Pregnancy  If you are about to stop having your period (premenopausal) and you may become pregnant, seek counseling before you get pregnant.  Take 400 to 800 micrograms (mcg) of folic acid every day if you become pregnant.  Ask for birth control (contraception) if you want to prevent pregnancy. Osteoporosis and menopause Osteoporosis is a disease in which the bones lose minerals and strength with aging. This can result in bone fractures. If you are 52 years old or older, or if you are at risk for osteoporosis and fractures, ask your health care provider if you should:  Be screened for bone loss.  Take a calcium or vitamin D supplement to lower your risk of fractures.  Be given hormone replacement therapy (HRT) to treat symptoms of menopause. Follow these instructions at home: Lifestyle  Do not use any products that contain nicotine or tobacco, such as cigarettes, e-cigarettes, and chewing tobacco. If you need help quitting, ask your health care provider.  Do not use street drugs.  Do not share needles.  Ask your health care provider for help if you need support or information about quitting drugs. Alcohol use  Do not drink alcohol if: ? Your health care provider tells you not to drink. ? You are pregnant, may be pregnant, or are planning to become pregnant.  If you drink alcohol: ? Limit how much you use to 0-1 drink a day. ? Limit intake if you are breastfeeding.  Be aware of how much alcohol is in your drink. In the [place] one drink equals one 12 oz bottle of beer (355 mL), one 5 oz glass of wine (148 mL), or one 1 oz glass of hard liquor (44 mL). General instructions  Schedule regular health, dental, and eye exams.  Stay current with your  vaccines.  Tell your health care provider if: ? You often feel depressed. ? You have ever been abused or do not feel safe at home. Summary  Adopting a healthy lifestyle and getting preventive care are important in promoting health and wellness.  Follow your health care provider's instructions about healthy diet, exercising, and getting tested or screened for diseases.  Follow your health care provider's instructions on monitoring your cholesterol and blood pressure. This information is not intended to replace advice given to you by your health care provider. Make sure you discuss any questions you have with your health care provider. Document Released: 01/24/2011 Document Revised: 07/04/2018 Document Reviewed: 07/04/2018 Elsevier Patient Education  2020 [employer].

## 2019-02-21 LAB — POCT URINALYSIS DIPSTICK
Bilirubin, UA: NEGATIVE
Blood, UA: NEGATIVE
Glucose, UA: NEGATIVE
Ketones, UA: NEGATIVE
Nitrite, UA: NEGATIVE
Protein, UA: NEGATIVE
Spec Grav, UA: 1.02 (ref 1.010–1.025)
Urobilinogen, UA: 2 E.U./dL — AB
pH, UA: 7 (ref 5.0–8.0)

## 2019-02-21 LAB — POCT UA - MICROALBUMIN
Albumin/Creatinine Ratio, Urine, POC: 30
Creatinine, POC: 200 mg/dL
Microalbumin Ur, POC: 30 mg/L

## 2019-02-26 ENCOUNTER — Ambulatory Visit (INDEPENDENT_AMBULATORY_CARE_PROVIDER_SITE_OTHER): Payer: Medicare Other

## 2019-02-26 ENCOUNTER — Ambulatory Visit: Payer: Self-pay

## 2019-02-26 ENCOUNTER — Encounter: Payer: Medicare Other | Admitting: Nurse Practitioner

## 2019-02-26 DIAGNOSIS — I10 Essential (primary) hypertension: Secondary | ICD-10-CM

## 2019-02-26 DIAGNOSIS — E559 Vitamin D deficiency, unspecified: Secondary | ICD-10-CM

## 2019-02-26 DIAGNOSIS — R413 Other amnesia: Secondary | ICD-10-CM

## 2019-02-26 DIAGNOSIS — M0579 Rheumatoid arthritis with rheumatoid factor of multiple sites without organ or systems involvement: Secondary | ICD-10-CM

## 2019-02-26 NOTE — Patient Instructions (Signed)
Social Worker Visit Information  Goals we discussed today:  Goals Addressed            This Visit's Progress   . "it would be easier if she had pill packs"       Daughter stated:  Current Barriers:  . iADL limitations related to patients impaired vision . Limited access to caregiver- the patient's son check on her daily to provide medication management after work, reported difficulty with medication management surrounding time frames . Memory Deficits - daughter reports the patient has early signs of dementia  Chronic Care Management Goal(s):  [name] Over the next 45 days, patient will work with embedded PharmD to address needs related to medication management  CCM SW Interventions: Completed 02/26/2019 . Outbound call placed to the patient to introduce CCM SW and obtain consent for enrollment. The patient gave verbal consent for CCM SW to contact her daughter [name] regarding enrollment . Outbound call to the patients daughter [name] who gives consent for enrollment . Assessed for patient needs- daughter requests assistance with pill packaging to alleviate stress on the patients son with medication management. [name] reports she lives in [place] but she and her brother work together to meet the patients needs. The patients son check on her daily after work and assure medications have been taken appropriately. [name] feels some stress could be alleviated if the patient were to receive pill packaging . Educated [name] on the role of each CCM team member . Advised [name] to expect a follow up call from embedded PharmD to assist with patient goal . Collaboration with CCM RN Case Manager and PharmD to communicate patients enrollment status  Patient Self Care Activities:  . Currently UNABLE TO independently administer own medications  Initial goal documentation         Materials provided: Verbal education about CCM program provided by phone  Ms.  Delia was given information about Chronic Care Management services today including:  1. CCM service includes personalized support from designated clinical staff supervised by her physician, including individualized plan of care and coordination with other care providers 2. 24/7 contact phone numbers for assistance for urgent and routine care needs. 3. Service will only be billed when office clinical staff spend 20 minutes or more in a month to coordinate care. 4. Only one practitioner may furnish and bill the service in a calendar month. 5. The patient may stop CCM services at any time (effective at the end of the month) by phone call to the office staff. 6. The patient will be responsible for cost sharing (co-pay) of up to 20% of the service fee (after annual deductible is met).  Patient agreed to services and verbal consent obtained.   The patient verbalized understanding of instructions provided today and declined a print copy of patient instruction materials.   Follow up plan: SW will follow up with patient by phone over the next 4 weeks.   Daneen Schick, BSW, CDP Social Worker, Certified Dementia Practitioner [place] [facility] [phone]

## 2019-02-26 NOTE — Chronic Care Management (AMB) (Signed)
Chronic Care Management    Social Work General Note  02/26/2019 Name: Jordan [name] MRN: [id number] DOB: 1934/06/17  Jordan [name] is a 85 y.o. year old female who is a primary care patient of Jordan [name], Jordan [name]. The CCM was consulted to assist the patient with disease management.   I placed an outbound call to the patient to discuss referral to the Chronic Care Management program. The patient gave verbal permission for CCM team to contact her daughter Jordan [name] regarding enrollment decisions and to assess for care management needs.  [name] was given information about Chronic Care Management services today in relation to the patients care including:  1. CCM service includes personalized support from designated clinical staff supervised by her physician, including individualized plan of care and coordination with other care providers 2. 24/7 contact phone numbers for assistance for urgent and routine care needs. 3. Service will only be billed when office clinical staff spend 20 minutes or more in a month to coordinate care. 4. Only one practitioner may furnish and bill the service in a calendar month. 5. The patient may stop CCM services at any time (effective at the end of the month) by phone call to the office staff. 6. The patient will be responsible for cost sharing (co-pay) of up to 20% of the service fee (after annual deductible is met).  [name] agreed to services on the patients behalf and verbal consent obtained.   Review of patient status, including review of consultants reports, relevant laboratory and other test results, and collaboration with appropriate care team members and the patient's provider was performed as part of comprehensive patient evaluation and provision of chronic care management services.    SDOH (Social Determinants of Health) screening performed today. At this time there are no SDOH challenges indicated. It is reported the patient lives alone and  has a PCS caregiver 7 days per week through Jordan [name]. The patient receives 4 hours of assistance on Monday's and 3 hours of assistance Tuesday - Sunday. In total, the patient is reported to have 110 PCS hours awarded per month. The daughter reports wanting hours increased but acknowledges the patient just had an assessment approximately two weeks ago and no changes in abilities.   The patient is reported to be legally blind and have beginning stages of dementia. The patients family has installed cameras throughout the house to monitor the patient for safety while in the home alone. The patients son has plan to install "Ring" camera's at both the front and back entrance within the next week.  The patients daughter, Jordan [name] reports currently living in [place]. The patients son lives locally and checks on the patient daily. He also assists with medication management. It is reported the son is experiencing increased caregiver stress in relation to medication management. The family feels some of this stress could be avoided if pill packaging were initiated. See below care plan: Goals Addressed            This Visit's Progress   . "it would be easier if she had pill packs"       Daughter stated:  Current Barriers:  . iADL limitations related to patients impaired vision . Limited access to caregiver- the patient's son check on her daily to provide medication management after work, reported difficulty with medication management surrounding time frames . Memory Deficits - daughter reports the patient has early signs of dementia  Chronic Care Management Goal(s):  [name] Over the next  45 days, patient will work with embedded PharmD to address needs related to medication management  CCM SW Interventions: Completed 02/26/2019 . Outbound call placed to the patient to introduce CCM SW and obtain consent for enrollment. The patient gave verbal consent for CCM SW to contact her daughter Jordan [name] regarding enrollment . Outbound call to the patients daughter Jordan [name] who gives consent for enrollment . Assessed for patient needs- daughter requests assistance with pill packaging to alleviate stress on the patients son with medication management. [name] reports she lives in [place] but she and her brother work together to meet the patients needs. The patients son check on her daily after work and assure medications have been taken appropriately. [name] feels some stress could be alleviated if the patient were to receive pill packaging . Educated [name] on the role of each CCM team member . Advised [name] to expect a follow up call from embedded PharmD to assist with patient goal . Collaboration with CCM RN Case Manager and PharmD to communicate patients enrollment status  Patient Self Care Activities:  . Currently UNABLE TO independently administer own medications  Initial goal documentation         Follow Up Plan: CCM SW will follow up with the patients daughter over the next four weeks to assess for SW related goals prior to signing off. The patient will be engaged by embedded PharmD to assist with pill packaging.       Jordan [name], BSW, CDP Social Worker, Certified Dementia Practitioner [place] [facility] [phone]  Total time spent performing care coordination and/or care management activities with the patient by phone or face to face = 30 minutes.

## 2019-02-26 NOTE — Chronic Care Management (AMB) (Signed)
  Chronic Care Management   Initial Visit Note  02/26/2019 Name: Jordan [name] MRN: [id number] DOB: 06-13-1934  Referred by: [name], FNP Reason for referral : Chronic Care Management (CCM RNCM Case Collaboration )   Jordan [name] is a 85 y.o. year old female who is a primary care patient of [name], [place]. The care management team was consulted for assistance with chronic disease management and care coordination needs.   Review of patient status, including review of consultants reports, relevant laboratory and other test results, and collaboration with appropriate care team members and the patient's provider was performed as part of comprehensive patient evaluation and provision of chronic care management services.    I initiated and established the plan of care for Jordan [name] during one on one collaboration with my clinical care management colleague Jordan [name] BSW who is also engaged with this patient to address social work needs.   Goals Addressed    . Assist with Chronic Care Management and Care Coordination needs       Current Barriers:  [name] Knowledge Barriers related to resources and support available to address needs related to Chronic Care Management, Medication management and Care Coordination   Case Manager Clinical Goal(s):  [name] Over the next 30 days, patient will work with the CCM team to address needs related to Chronic Care Management and assistance with medication management.   Interventions:  . Collaborated with BSW and initiated plan of care to address needs related to Chronic Care Management and assistance with medication management  Patient Self Care Activities:  . Self administers medications as prescribed . Attends all scheduled provider appointments . Calls pharmacy for medication refills . Calls provider office for new concerns or questions  Initial goal documentation         Telephone follow up appointment with care management team  member scheduled for: 03/07/19   Barb Merino, RN, BSN, CCM Care Management Coordinator [facility]  Direct Phone: [phone]

## 2019-02-27 ENCOUNTER — Other Ambulatory Visit: Payer: Self-pay | Admitting: Nurse Practitioner

## 2019-02-27 DIAGNOSIS — I10 Essential (primary) hypertension: Secondary | ICD-10-CM

## 2019-02-28 ENCOUNTER — Other Ambulatory Visit: Payer: Self-pay | Admitting: Nurse Practitioner

## 2019-03-04 ENCOUNTER — Telehealth: Payer: Self-pay

## 2019-03-04 DIAGNOSIS — D899 Disorder involving the immune mechanism, unspecified: Secondary | ICD-10-CM | POA: Diagnosis not present

## 2019-03-06 ENCOUNTER — Telehealth: Payer: Self-pay

## 2019-03-07 ENCOUNTER — Telehealth: Payer: Self-pay

## 2019-03-08 ENCOUNTER — Telehealth: Payer: Self-pay

## 2019-03-10 ENCOUNTER — Other Ambulatory Visit: Payer: Self-pay | Admitting: Rheumatology

## 2019-03-11 NOTE — Telephone Encounter (Signed)
Please schedule patient a follow up appointment. Patient was due May 2020. Thanks!

## 2019-03-11 NOTE — Telephone Encounter (Signed)
Last Visit: 05/22/18 Next Visit: was due in May 2020 Labs: 02/14/19 Hgb 11.4 MCV 74.4 MCH 24.2 RDW 16.2 Creat. 0.93 Chloride 97 Calcium 8.4 Albumin 3.2 TB Gold: 04/05/18 Neg   Okay to refill Enbrel?

## 2019-03-11 NOTE — Telephone Encounter (Signed)
Okay to give her 30-day supply and to schedule an appointment.

## 2019-03-12 ENCOUNTER — Telehealth: Payer: Self-pay

## 2019-03-12 ENCOUNTER — Other Ambulatory Visit: Payer: Self-pay | Admitting: Nurse Practitioner

## 2019-03-12 NOTE — Progress Notes (Signed)
Office Visit Note  Patient: Jordan [name]             Date of Birth: [birth year]           MRN: [id number]             PCP: Arnette Felts, FNP Referring: Arnette Felts, FNP Visit Date: 03/14/2019 Occupation: @GUAROCC @  Subjective:  Pain in both 1st MCP joints   History of Present Illness: Jordan [name] is a 85 y.o. female with history of seropositive rheumatoid arthritis and DDD.  Patient is on Enbrel 50 mg subcutaneous injections every week.  She has not missed any doses of Enbrel.  She discontinued methotrexate due to elevated creatinine revealed on lab work from 02/14/2019.  She has not noticed any increased joint pain or joint swelling since discontinuing methotrexate.  She continues to have chronic pain in bilateral first MCP joints.  She states she is had some difficulty using her cane due to discomfort.  She has chronic pain in bilateral knees which are replaced.  She denies any joint swelling at this time.  She continues to have intermittent lower back pain.  She denies any symptoms of radiculopathy. She is not taking a vitamin D or calcium supplement.      Activities of Daily Living:  Patient reports morning stiffness for 30-60 minutes.   Patient Denies nocturnal pain.  Difficulty dressing/grooming: Reports Difficulty climbing stairs: Reports Difficulty getting out of chair: Reports Difficulty using hands for taps, buttons, cutlery, and/or writing: Reports  Review of Systems  Constitutional: Positive for fatigue.  HENT: Negative for mouth sores, mouth dryness and nose dryness.   Eyes: Negative for pain, itching, visual disturbance and dryness.  Respiratory: Negative for cough, hemoptysis, shortness of breath, wheezing and difficulty breathing.   Cardiovascular: Negative for chest pain, palpitations, hypertension and swelling in legs/feet.  Gastrointestinal: Positive for constipation. Negative for abdominal pain, blood in stool and diarrhea.  Endocrine: Negative for  increased urination.  Genitourinary: Negative for difficulty urinating and painful urination.  Musculoskeletal: Positive for arthralgias, joint pain, joint swelling and morning stiffness. Negative for myalgias, muscle weakness, muscle tenderness and myalgias.  Skin: Negative for color change, pallor, rash, hair loss, nodules/bumps, redness, skin tightness, ulcers and sensitivity to sunlight.  Allergic/Immunologic: Negative for susceptible to infections.  Neurological: Negative for dizziness, light-headedness and headaches.  Hematological: Negative for swollen glands.  Psychiatric/Behavioral: Positive for depressed mood and sleep disturbance. The patient is nervous/anxious.     PMFS History:  Patient Active Problem List   Diagnosis Date Noted  . Essential hypertension 08/13/2018  . Acquired hypothyroidism 08/13/2018  . Rash and nonspecific skin eruption 08/13/2018  . Memory loss 04/12/2017  . Depression 04/12/2017  . Rheumatoid arthritis involving multiple sites with positive rheumatoid factor (HCC) 08/17/2016  . High risk medication use 08/17/2016  . H/O total knee replacement, bilateral 08/17/2016  . Spondylosis of lumbar region without myelopathy or radiculopathy 08/17/2016  . History of glaucoma/ patient is legally blind 08/17/2016  . History of gastroesophageal reflux (GERD) 08/17/2016  . History of diverticulosis 08/17/2016  . History of hypothyroidism 08/17/2016  . history of asthmatic bronchitis 08/17/2016  . History of memory loss 08/17/2016  . History of atherosclerosis 08/17/2016    Past Medical History:  Diagnosis Date  . Allergy   . Anxiety    per pt's daughter  . Arthritis   . Asthma    pt reported  . Depression   . GERD (gastroesophageal reflux disease)   . Hearing  deficit L  . High cholesterol   . Hypertension   . Osteoporosis   . Stroke [facility])     History reviewed. No pertinent family history. Past Surgical History:  Procedure Laterality Date  .  ABDOMINAL HYSTERECTOMY    . REPLACEMENT TOTAL KNEE BILATERAL Bilateral   . WRIST SURGERY Left    Social History   Social History Narrative   Lives at home with grandson.   Right-handed.   2-3 cups coffee per day.    There is no immunization history on file for this patient.   Objective: Vital Signs: BP (!) 157/86 (BP Location: Left Arm, Patient Position: Sitting, Cuff Size: Normal)   Pulse (!) 101   Resp 12   Ht 5\' 1"  (1.549 m)   Wt 94 lb 6.4 oz (42.8 kg)   BMI 17.84 kg/m    Physical Exam Vitals signs and nursing note reviewed.  Constitutional:      Appearance: She is well-developed.  HENT:     Head: Normocephalic and atraumatic.  Eyes:     Conjunctiva/sclera: Conjunctivae normal.  Neck:     Musculoskeletal: Normal range of motion.  Cardiovascular:     Rate and Rhythm: Normal rate and regular rhythm.     Heart sounds: Normal heart sounds.  Pulmonary:     Effort: Pulmonary effort is normal.     Breath sounds: Normal breath sounds.  Abdominal:     General: Bowel sounds are normal.     Palpations: Abdomen is soft.  Lymphadenopathy:     Cervical: No cervical adenopathy.  Skin:    General: Skin is warm and dry.     Capillary Refill: Capillary refill takes less than 2 seconds.  Neurological:     Mental Status: She is alert and oriented to person, place, and time.  Psychiatric:        Behavior: Behavior normal.      Musculoskeletal Exam: C-spine limited ROM.  Thoracic kyphosis noted.  Limited ROM of lumbar spine.  Shoulder joints, elbow joints, wrist joints, MCPs, PIPs, and DIPs good ROM.  Synovial thickening of bilateral 1st MCP joints.  Tenderness of both 1st MCPs.  Tenderness and inflammation of the right 3rd PIP joint.  Hip joints difficult to assess while in seated position.  Knee replacements are warmth but no effusion noted.  No tenderness or inflammation of ankle joints.   CDAI Exam: CDAI Score: 4.8  Patient Global: 4 mm; Provider Global: 4 mm Swollen: 1 ;  Tender: 3  Joint Exam      Right  Left  MCP 1   Tender   Tender  PIP 3  Swollen Tender        Investigation: No additional findings.  Imaging: No results found.  Recent Labs: Lab Results  Component Value Date   WBC 7.8 02/14/2019   HGB 11.4 (L) 02/14/2019   PLT 353 02/14/2019   NA 137 02/14/2019   K 4.4 02/14/2019   CL 97 (L) 02/14/2019   CO2 27 02/14/2019   GLUCOSE 83 02/14/2019   BUN 12 02/14/2019   CREATININE 0.93 (H) 02/14/2019   BILITOT 0.3 02/14/2019   ALKPHOS 65 01/09/2017   AST 10 02/14/2019   ALT 5 (L) 02/14/2019   PROT 6.4 02/14/2019   ALBUMIN 4.0 01/09/2017   CALCIUM 8.4 (L) 02/14/2019   GFRAA 66 02/14/2019   QFTBGOLDPLUS NEGATIVE 04/05/2018    Speciality Comments: No specialty comments available.  Procedures:  No procedures performed Allergies: Penicillins  Assessment / Plan:     Visit Diagnoses: Rheumatoid arthritis involving multiple sites with positive rheumatoid factor (HCC): She has synovitis and tenderness of the right 3rd PIP joint and tenderness of bilateral 1st MCP joints.  She has some difficulty using her cane due to discomfort in bilateral first MCPs.  She has chronic pain in bilateral knees which are replaced.  Warmth but no effusion was noted in bilateral knee replacements.  Overall she is clinically doing well on Enbrel 50 mg subcutaneous injections every week.  She was taken off of methotrexate 3 tablets by mouth once weekly after having lab work on 02/14/2019 which revealed elevated creatinine.  She has not noticed any increased joint pain or joint swelling since discontinuing methotrexate.  She will continue on Enbrel monotherapy.  She was advised to notify us if she develops increased joint pain or joint swelling.  She will follow-up in the office in 5 months.  High risk medication use -  Enbrel Sureclick 50 mg every 7 days.Last TB gold negative on 04/05/2018 and will monitor yearly.  Future TB gold order placed.  Most recent CBC  stable and CMP showed low calcium and elevated creatinine on 02/14/2019.  She was advised to discontinue methotrexate. Due for CBC/CMP in October and will monitor every 3 months. Standing orders are in place.  I discussed the importance of holding Enbrel if she develops any signs or symptoms of an infection and to resume once the infection has completely cleared.  - Plan: QuantiFERON-TB Gold Plus  H/O total knee replacement, bilateral: She has chronic pain in both knee joints which are replaced. She has warmth but no effusion on exam.  She continues to walk with a cane.   DDD (degenerative disc disease), lumbar: She has intermittent lower back pain.  She does not have any symptoms of radiculopathy.    Age-related osteoporosis without current pathological fracture -Last DEXA ordered by our office.  Scan on 06/19/2017 showed T-score of -3 at left femur neck and no baseline for comparison of BMD change. Patient notified of diagnosis of osteoporosis in November 2018.  She is not taking anything for the treatment of osteoporosis.  She is not taking calcium or vitamin D.  A future order for DXA was placed today. Plan: DG BONE DENSITY (DXA)  Other medical conditions are listed as follows:   History of glaucoma/ patient is legally blind  history of asthmatic bronchitis  History of memory loss  History of diverticulosis  History of atherosclerosis  History of gastroesophageal reflux (GERD)  History of hypothyroidism    Orders: Orders Placed This Encounter  Procedures  . DG BONE DENSITY (DXA)  . QuantiFERON-TB Gold Plus   No orders of the defined types were placed in this encounter.     Follow-Up Instructions: Return in about 5 months (around 08/14/2019) for Rheumatoid arthritis, DDD.   Gearldine Bienenstock, PA-C   I examined and evaluated the patient with Sherron Ales PA.  Patient had no active synovitis on examination.  Although she does have synovial thickening and changes consistent with  osteoarthritis.  She will continue current regimen.  The concern today was her elevated blood pressure.  We took her blood pressure several times.  She was advised to go to the emergency room.  Patient preferred going to the PCP.  She will go to PCP directly from the office.  The plan of care was discussed as noted above.  Pollyann Savoy, MD  Note - This record has been  created using Dragon software.  Chart creation errors have been sought, but may not always  have been located. Such creation errors do not reflect on  the standard of medical care.  

## 2019-03-13 ENCOUNTER — Telehealth: Payer: Self-pay

## 2019-03-13 NOTE — Telephone Encounter (Signed)
Patient's daughter called stating the pt bp is running high I have returned her call to see what the reading was and she was unable to tell me exactly what it is she stated it was in the 100s over 200s or vice versa. She stated pt has an appointment tomorrow with Dr.Devenshawr and they will check her bp and she will give [place] a call back and let us know what it is. YRL,RMA

## 2019-03-14 ENCOUNTER — Encounter: Payer: Self-pay | Admitting: Rheumatology

## 2019-03-14 ENCOUNTER — Other Ambulatory Visit: Payer: Self-pay

## 2019-03-14 ENCOUNTER — Ambulatory Visit (INDEPENDENT_AMBULATORY_CARE_PROVIDER_SITE_OTHER): Payer: Medicare Other | Admitting: Rheumatology

## 2019-03-14 ENCOUNTER — Encounter: Payer: Self-pay | Admitting: Nurse Practitioner

## 2019-03-14 ENCOUNTER — Ambulatory Visit (INDEPENDENT_AMBULATORY_CARE_PROVIDER_SITE_OTHER): Payer: Medicare Other | Admitting: Nurse Practitioner

## 2019-03-14 VITALS — BP 164/102 | HR 98 | Temp 98.3°F | Ht 60.2 in | Wt 93.4 lb

## 2019-03-14 VITALS — BP 202/113 | HR 99 | Resp 12 | Ht 61.0 in | Wt 94.4 lb

## 2019-03-14 DIAGNOSIS — Z8679 Personal history of other diseases of the circulatory system: Secondary | ICD-10-CM

## 2019-03-14 DIAGNOSIS — I10 Essential (primary) hypertension: Secondary | ICD-10-CM | POA: Diagnosis not present

## 2019-03-14 DIAGNOSIS — E039 Hypothyroidism, unspecified: Secondary | ICD-10-CM | POA: Diagnosis not present

## 2019-03-14 DIAGNOSIS — M5136 Other intervertebral disc degeneration, lumbar region: Secondary | ICD-10-CM | POA: Diagnosis not present

## 2019-03-14 DIAGNOSIS — Z8719 Personal history of other diseases of the digestive system: Secondary | ICD-10-CM

## 2019-03-14 DIAGNOSIS — Z87898 Personal history of other specified conditions: Secondary | ICD-10-CM

## 2019-03-14 DIAGNOSIS — H548 Legal blindness, as defined in USA: Secondary | ICD-10-CM

## 2019-03-14 DIAGNOSIS — Z8669 Personal history of other diseases of the nervous system and sense organs: Secondary | ICD-10-CM | POA: Diagnosis not present

## 2019-03-14 DIAGNOSIS — M0579 Rheumatoid arthritis with rheumatoid factor of multiple sites without organ or systems involvement: Secondary | ICD-10-CM | POA: Diagnosis not present

## 2019-03-14 DIAGNOSIS — Z79899 Other long term (current) drug therapy: Secondary | ICD-10-CM | POA: Diagnosis not present

## 2019-03-14 DIAGNOSIS — M810 Age-related osteoporosis without current pathological fracture: Secondary | ICD-10-CM

## 2019-03-14 DIAGNOSIS — Z8709 Personal history of other diseases of the respiratory system: Secondary | ICD-10-CM

## 2019-03-14 DIAGNOSIS — Z8639 Personal history of other endocrine, nutritional and metabolic disease: Secondary | ICD-10-CM

## 2019-03-14 DIAGNOSIS — Z96653 Presence of artificial knee joint, bilateral: Secondary | ICD-10-CM

## 2019-03-14 MED ORDER — LISINOPRIL 30 MG PO TABS: 30.0000 mg | ORAL_TABLET | Freq: Every day | ORAL | 1 refills | Status: DC

## 2019-03-14 NOTE — Progress Notes (Signed)
Subjective:     Patient ID: Jordan [name] , female    DOB: 12/15/1933 , 85 y.o.   MRN: [id number]   Chief Complaint  Patient presents with  . Hypertension    patient's daughter called stating her blood pressure has been running really high    HPI  [facility] - blood pressure was up to 200/110, took this am was normal at Dr. Bronson Curb took again was 200/110 rechecked   Hypertension This is a chronic problem. The current episode started more than 1 year ago. The problem has been gradually worsening since onset. The problem is uncontrolled. Pertinent negatives include no anxiety or headaches. Agents associated with hypertension include thyroid hormones. Risk factors for coronary artery disease include sedentary lifestyle. Past treatments include ACE inhibitors. There is no history of angina. Identifiable causes of hypertension include a thyroid problem.     Past Medical History:  Diagnosis Date  . Allergy   . Anxiety    per pt's daughter  . Arthritis   . Asthma    pt reported  . Depression   . GERD (gastroesophageal reflux disease)   . Hearing deficit L  . High cholesterol   . Hypertension   . Osteoporosis   . Stroke [facility])      No family history on file.   Current Outpatient Medications:  .  albuterol (PROVENTIL HFA;VENTOLIN HFA) 108 (90 Base) MCG/ACT inhaler, Inhale 1 puff into the lungs every 6 (six) hours as needed for wheezing or shortness of breath., Disp: , Rfl:  .  atropine 1 % ophthalmic solution, INSTILL 1 GTT INTO OS D, Disp: , Rfl: 5 .  bacitracin ointment, Apply to affected area daily, Disp: 30 g, Rfl: 0 .  brimonidine-timolol (COMBIGAN) 0.2-0.5 % ophthalmic solution, Place 1 drop into both eyes every 12 (twelve) hours., Disp: , Rfl:  .  doxycycline (VIBRA-TABS) 100 MG tablet, Take 1 tablet (100 mg total) by mouth 2 (two) times daily., Disp: 14 tablet, Rfl: 0 .  etanercept (ENBREL SURECLICK) 50 MG/ML injection, INJECT CONTENTS OF 1 SYRINGE UNDER THE  SKIN ONCE WEEKLY, Disp: 4 mL, Rfl: 0 .  fluticasone (FLONASE) 50 MCG/ACT nasal spray, INSTILL 2 SPRAYS IN EACH NOSTRIL EVERY DAY, Disp: 16 g, Rfl: 1 .  folic acid (FOLVITE) 1 MG tablet, TAKE 1 TABLET(1 MG) BY MOUTH DAILY, Disp: 90 tablet, Rfl: 0 .  HYDROcodone-acetaminophen (NORCO) 10-325 MG tablet, Take 1 tablet by mouth every 6 (six) hours as needed., Disp: , Rfl:  .  latanoprost (XALATAN) 0.005 % ophthalmic solution, Place 1 drop into both eyes nightly., Disp: , Rfl:  .  levothyroxine (SYNTHROID) 100 MCG tablet, TAKE 1 TABLET BY MOUTH EVERY DAY, Disp: 90 tablet, Rfl: 0 .  lisinopril (ZESTRIL) 20 MG tablet, TAKE 1 TABLET BY MOUTH DAILY. HOLD IF BLOOD PRESSURE IS LESS THAN 130/80, Disp: 90 tablet, Rfl: 1 .  loratadine (CLARITIN) 10 MG tablet, Take 1 tablet (10 mg total) by mouth daily., Disp: 90 tablet, Rfl: 0 .  mirtazapine (REMERON) 15 MG tablet, Take 1 tablet (15 mg total) by mouth at bedtime., Disp: 30 tablet, Rfl: 2 .  NAMZARIC 28-10 MG CP24, Take 1 capsule by mouth daily., Disp: 90 capsule, Rfl: 0 .  omeprazole (PRILOSEC) 20 MG capsule, TAKE 1 CAPSULE BY MOUTH EVERY DAY, Disp: 90 capsule, Rfl: 0 .  QUEtiapine (SEROQUEL) 25 MG tablet, Take 2 tablets (50 mg total) by mouth at bedtime., Disp: 60 tablet, Rfl: 11 .  simvastatin (ZOCOR) 40 MG  tablet, TAKE 1 TABLET BY MOUTH AT BEDTIME, Disp: 90 tablet, Rfl: 1   Allergies  Allergen Reactions  . Penicillins Hives     Review of Systems  Constitutional: Negative.   HENT:       Legally blind  Respiratory: Negative.   Cardiovascular: Negative.   Neurological: Negative for dizziness, syncope, weakness and headaches.  Psychiatric/Behavioral: Negative.      Today's Vitals   03/14/19 1218  BP: (!) 164/102  Pulse: 98  Temp: 98.3 F (36.8 C)  TempSrc: Oral  Weight: 93 lb 6.4 oz (42.4 kg)  Height: 5' 0.2" (1.529 m)  PainSc: 0-No pain   Body mass index is 18.12 kg/m.   Objective:  Physical Exam Vitals signs reviewed.  Constitutional:       Appearance: Normal appearance.  Cardiovascular:     Rate and Rhythm: Normal rate and regular rhythm.     Pulses: Normal pulses.     Heart sounds: Normal heart sounds. No murmur.  Pulmonary:     Effort: Pulmonary effort is normal. No respiratory distress.     Breath sounds: Normal breath sounds.  Skin:    Capillary Refill: Capillary refill takes less than 2 seconds.  Neurological:     General: No focal deficit present.     Mental Status: She is alert and oriented to person, place, and time.  Psychiatric:        Mood and Affect: Mood normal.        Behavior: Behavior normal.        Thought Content: Thought content normal.        Judgment: Judgment normal.         Assessment And Plan:     1. Acquired hypothyroidism  She did not stop for labs at her visit in July, will recheck thyroid levels today  My concern is her thyroid levels are abnormal  Her daughter reports her brother has been administering her medications  Will refer to CCM for assistance with medications - TSH - T3 - T4, Free  2. Uncontrolled hypertension  This has increased in recent weeks, advised her to restart her lisinopril as the last visit her blood pressure was low.  She is to return in 2 weeks for blood pressure check - BMP8+eGFR - lisinopril (ZESTRIL) 30 MG tablet; Take 1 tablet (30 mg total) by mouth daily.  Dispense: 90 tablet; Refill: 1   [name], FNP    THE PATIENT IS ENCOURAGED TO PRACTICE SOCIAL DISTANCING DUE TO THE COVID-19 PANDEMIC.

## 2019-03-14 NOTE — Patient Instructions (Addendum)
Standing Labs We placed an order today for your standing lab work.    Please come back and get your standing labs in October and every 3 months   We have open lab daily Monday through Thursday from 8:30-12:30 PM and 1:30-4:30 PM and Friday from 8:30-12:30 PM and 1:30 -4:00 PM at the office of Dr. Bo Merino.   You may experience shorter wait times on Monday and Friday afternoons. The office is located at [address], [place], [place], [place] [address] No appointment is necessary.   Labs are drawn by [facility].  You may receive a bill from [facility] for your lab work.  If you wish to have your labs drawn at another location, please call the office 24 hours in advance to send orders.  If you have any questions regarding directions or hours of operation,  please call [phone].   Just as a reminder please drink plenty of water prior to coming for your lab work. Thanks!       Bone density is due in November 2020.  Future order will be placed today.

## 2019-03-14 NOTE — Patient Instructions (Signed)

## 2019-03-15 LAB — BMP8+EGFR
BUN/Creatinine Ratio: 15 (ref 12–28)
BUN: 11 mg/dL (ref 8–27)
CO2: 25 mmol/L (ref 20–29)
Calcium: 9.1 mg/dL (ref 8.7–10.3)
Chloride: 103 mmol/L (ref 96–106)
Creatinine, Ser: 0.71 mg/dL (ref 0.57–1.00)
GFR calc Af Amer: 91 mL/min/{1.73_m2} (ref >59)
GFR calc non Af Amer: 79 mL/min/{1.73_m2} (ref >59)
Glucose: 65 mg/dL (ref 65–99)
Potassium: 3.4 mmol/L — ABNORMAL LOW (ref 3.5–5.2)
Sodium: 143 mmol/L (ref 134–144)

## 2019-03-15 LAB — PTH, INTACT AND CALCIUM: PTH: 50 pg/mL (ref 15–65)

## 2019-03-15 LAB — T4, FREE: Free T4: 2.04 ng/dL — ABNORMAL HIGH (ref 0.82–1.77)

## 2019-03-15 LAB — T3: T3, Total: 139 ng/dL (ref 71–180)

## 2019-03-15 LAB — TSH: TSH: 0.031 u[IU]/mL — ABNORMAL LOW (ref 0.450–4.500)

## 2019-03-18 ENCOUNTER — Encounter: Payer: Medicare Other | Admitting: Nurse Practitioner

## 2019-03-18 ENCOUNTER — Telehealth: Payer: Self-pay

## 2019-03-19 ENCOUNTER — Telehealth: Payer: Self-pay | Admitting: Pharmacist

## 2019-03-19 ENCOUNTER — Telehealth: Payer: Self-pay

## 2019-03-19 NOTE — Telephone Encounter (Signed)
Patient's daughter [name] called wanting to confirm patient's appointment she wasn't sure if it was tomorrow or Thursday I advised her that the appointment is for tomorrow and it is a lab visit. She requested that I move the appointment until 12 I have rescheduled it and also told her to make sure she is here by 12 because  The office closes at 1 for lunch. YRL,RMA

## 2019-03-20 ENCOUNTER — Other Ambulatory Visit: Payer: Medicare Other

## 2019-03-20 ENCOUNTER — Other Ambulatory Visit: Payer: Self-pay

## 2019-03-26 ENCOUNTER — Telehealth: Payer: Self-pay

## 2019-03-26 ENCOUNTER — Ambulatory Visit: Payer: Self-pay

## 2019-03-26 DIAGNOSIS — E559 Vitamin D deficiency, unspecified: Secondary | ICD-10-CM

## 2019-03-26 DIAGNOSIS — R413 Other amnesia: Secondary | ICD-10-CM

## 2019-03-26 DIAGNOSIS — I10 Essential (primary) hypertension: Secondary | ICD-10-CM

## 2019-03-26 NOTE — Chronic Care Management (AMB) (Signed)
  Chronic Care Management   Social Work Follow Up Note  03/26/2019 Name: Jordan [name] MRN: [id number] DOB: [birth year]  CCM SW placed an unsuccessful outbound call to the patients daughter to assess for SW resource needs. SW left a HIPAA compliant voice message requesting a return call.  Follow Up Plan: SW will follow up with the patients daughter over the next two weeks if no return call is received.   Daneen Schick, BSW, CDP Social Worker, Certified Dementia Practitioner [place] [facility] [phone]

## 2019-04-03 ENCOUNTER — Ambulatory Visit: Payer: Medicare Other | Admitting: Nurse Practitioner

## 2019-04-04 ENCOUNTER — Telehealth: Payer: Self-pay

## 2019-04-04 ENCOUNTER — Ambulatory Visit: Payer: Self-pay

## 2019-04-04 DIAGNOSIS — D899 Disorder involving the immune mechanism, unspecified: Secondary | ICD-10-CM | POA: Diagnosis not present

## 2019-04-04 DIAGNOSIS — I10 Essential (primary) hypertension: Secondary | ICD-10-CM

## 2019-04-04 DIAGNOSIS — E559 Vitamin D deficiency, unspecified: Secondary | ICD-10-CM

## 2019-04-04 DIAGNOSIS — R413 Other amnesia: Secondary | ICD-10-CM

## 2019-04-04 DIAGNOSIS — Z049 Encounter for examination and observation for unspecified reason: Secondary | ICD-10-CM | POA: Diagnosis not present

## 2019-04-04 NOTE — Chronic Care Management (AMB) (Signed)
  Chronic Care Management   Social Work Note  04/04/2019 Name: Jordan [name] MRN: [id number] DOB: [birth year]  Outbound call placed to the patients daughter to assess progression of patient goals. [name] reports she is currently driving and unable to hear well. Rescheduled appointment for Friday 04/05/2019.  Daneen Schick, BSW, CDP Social Worker, Certified Dementia Practitioner [place] [facility] [phone]

## 2019-04-05 ENCOUNTER — Telehealth: Payer: Self-pay

## 2019-04-05 ENCOUNTER — Ambulatory Visit: Payer: Self-pay

## 2019-04-05 DIAGNOSIS — R413 Other amnesia: Secondary | ICD-10-CM

## 2019-04-05 DIAGNOSIS — I10 Essential (primary) hypertension: Secondary | ICD-10-CM

## 2019-04-05 DIAGNOSIS — E559 Vitamin D deficiency, unspecified: Secondary | ICD-10-CM

## 2019-04-05 NOTE — Patient Instructions (Signed)
Social Worker Visit Information  Goals we discussed today:  Goals Addressed            This Visit's Progress   . "it would be easier if she had pill packs"       Daughter stated:  Current Barriers:  . iADL limitations related to patients impaired vision . Limited access to caregiver- the patient's son check on her daily to provide medication management after work, reported difficulty with medication management surrounding time frames . Memory Deficits - daughter reports the patient has early signs of dementia  Chronic Care Management Goal(s):  [name] Over the next 45 days, patient will work with embedded PharmD to address needs related to medication management  CCM SW Interventions: Completed 04/05/2019 . Outbound call placed to the patients daughter Jordan [name] to assess progression of patient goal. . Determined Jordan [name] has not yet received an e-mail from embedded pharmD and reports leaving Jordan [name] a voice message requesting follow up on pill packaging system. . Collaboration with Jordan [name] via in basket message requesting phone call follow up to the patients daughter.  Patient Self Care Activities:  . Currently UNABLE TO independently administer own medications  Please see past updates related to this goal by clicking on the "Past Updates" button in the selected goal          Follow Up Plan: No further SW follow up needed. Please contact me for future resource needs.   Jordan [name], BSW, CDP Social Worker, Certified Dementia Practitioner [place] [facility] [place] [facility] [phone]

## 2019-04-05 NOTE — Chronic Care Management (AMB) (Signed)
Chronic Care Management    Social Work Follow Up Note  04/05/2019 Name: Jordan [name] MRN: [id number] DOB: 16-Aug-1933  Jordan [name] is a 85 y.o. year old female who is a primary care patient of [name], [place]. The CCM team was consulted for assistance with chronic care management.   Review of patient status, including review of consultants reports, other relevant assessments, and collaboration with appropriate care team members and the patient's provider was performed as part of comprehensive patient evaluation and provision of chronic care management services.      Outpatient Encounter Medications as of 04/05/2019  Medication Sig  . albuterol (PROVENTIL HFA;VENTOLIN HFA) 108 (90 Base) MCG/ACT inhaler Inhale 1 puff into the lungs every 6 (six) hours as needed for wheezing or shortness of breath.  [name] atropine 1 % ophthalmic solution INSTILL 1 GTT INTO OS D  . bacitracin ointment Apply to affected area daily  . brimonidine-timolol (COMBIGAN) 0.2-0.5 % ophthalmic solution Place 1 drop into both eyes every 12 (twelve) hours.  [name] doxycycline (VIBRA-TABS) 100 MG tablet Take 1 tablet (100 mg total) by mouth 2 (two) times daily.  [name] etanercept (ENBREL SURECLICK) 50 MG/ML injection INJECT CONTENTS OF 1 SYRINGE UNDER THE SKIN ONCE WEEKLY  . fluticasone (FLONASE) 50 MCG/ACT nasal spray INSTILL 2 SPRAYS IN EACH NOSTRIL EVERY DAY  . folic acid (FOLVITE) 1 MG tablet TAKE 1 TABLET(1 MG) BY MOUTH DAILY  . HYDROcodone-acetaminophen (NORCO) 10-325 MG tablet Take 1 tablet by mouth every 6 (six) hours as needed.  . latanoprost (XALATAN) 0.005 % ophthalmic solution Place 1 drop into both eyes nightly.  . levothyroxine (SYNTHROID) 100 MCG tablet TAKE 1 TABLET BY MOUTH EVERY DAY  . lisinopril (ZESTRIL) 30 MG tablet Take 1 tablet (30 mg total) by mouth daily.  [name] loratadine (CLARITIN) 10 MG tablet Take 1 tablet (10 mg total) by mouth daily.  . mirtazapine (REMERON) 15 MG tablet Take 1 tablet (15 mg total) by  mouth at bedtime.  [name] NAMZARIC 28-10 MG CP24 Take 1 capsule by mouth daily.  [name] omeprazole (PRILOSEC) 20 MG capsule TAKE 1 CAPSULE BY MOUTH EVERY DAY  . QUEtiapine (SEROQUEL) 25 MG tablet Take 2 tablets (50 mg total) by mouth at bedtime.  . simvastatin (ZOCOR) 40 MG tablet TAKE 1 TABLET BY MOUTH AT BEDTIME   No facility-administered encounter medications on file as of 04/05/2019.      Goals Addressed            This Visit's Progress   . "it would be easier if she had pill packs"       Daughter stated:  Current Barriers:  . iADL limitations related to patients impaired vision . Limited access to caregiver- the patient's son check on her daily to provide medication management after work, reported difficulty with medication management surrounding time frames . Memory Deficits - daughter reports the patient has early signs of dementia  Chronic Care Management Goal(s):  [name] Over the next 45 days, patient will work with embedded PharmD to address needs related to medication management  CCM SW Interventions: Completed 04/05/2019 . Outbound call placed to the patients daughter Jordan [name] to assess progression of patient goal. . Determined Jordan [name] has not yet received an e-mail from embedded pharmD and reports leaving Jordan [name] a voice message requesting follow up on pill packaging system. . Collaboration with Jordan [name] via in basket message requesting phone call follow up to the patients daughter.  Patient Self Care Activities:  . Currently  UNABLE TO independently administer own medications  Please see past updates related to this goal by clicking on the "Past Updates" button in the selected goal          Follow Up Plan: No SW follow up planned at this time. The patient will be contacted by embedded PharmD in the coming weeks.   Jordan [name], BSW, CDP Social Worker, Certified Dementia Practitioner [facility] Management [phone]  Total time spent performing  care coordination and/or care management activities with the patient by phone or face to face = 5 minutes.

## 2019-04-08 ENCOUNTER — Other Ambulatory Visit: Payer: Self-pay | Admitting: Nurse Practitioner

## 2019-04-08 ENCOUNTER — Telehealth: Payer: Self-pay

## 2019-04-15 ENCOUNTER — Other Ambulatory Visit: Payer: Self-pay | Admitting: Rheumatology

## 2019-04-15 NOTE — Telephone Encounter (Signed)
Last visit: 03/14/19 Next Visit: due January 2021. Message sent to the front to schedule patient. Labs: 02/14/19 She has low creatinine TB Gold: 04/05/18 Neg   Patient reminded she is due to update her TB Gold.  Okay to refill 30 day supply per Dr. Estanislado Pandy

## 2019-04-17 ENCOUNTER — Telehealth: Payer: Self-pay

## 2019-04-23 ENCOUNTER — Ambulatory Visit: Payer: Self-pay | Admitting: Nurse Practitioner

## 2019-04-25 ENCOUNTER — Telehealth: Payer: Self-pay

## 2019-04-26 ENCOUNTER — Telehealth: Payer: Self-pay

## 2019-05-03 DIAGNOSIS — Z049 Encounter for examination and observation for unspecified reason: Secondary | ICD-10-CM | POA: Diagnosis not present

## 2019-05-09 ENCOUNTER — Telehealth: Payer: Self-pay

## 2019-05-10 ENCOUNTER — Other Ambulatory Visit: Payer: Self-pay | Admitting: Rheumatology

## 2019-05-10 NOTE — Telephone Encounter (Signed)
Ok to send in 30-day supply.

## 2019-05-10 NOTE — Telephone Encounter (Signed)
Last Visit: 03/14/19 Next Visit: 08/15/19  Labs: 02/14/19 She has low creatinine TB Gold: 04/05/18 Neg   Patient advised she is due to update labs. Patient will come 05/13/19 to update labs.   Okay to refill 30 day supply Enbrel?

## 2019-05-15 ENCOUNTER — Other Ambulatory Visit: Payer: Self-pay | Admitting: Physician Assistant

## 2019-05-15 ENCOUNTER — Other Ambulatory Visit: Payer: Self-pay | Admitting: Rheumatology

## 2019-05-15 DIAGNOSIS — M0579 Rheumatoid arthritis with rheumatoid factor of multiple sites without organ or systems involvement: Secondary | ICD-10-CM

## 2019-05-28 ENCOUNTER — Ambulatory Visit: Payer: Self-pay | Admitting: Pharmacist

## 2019-05-28 DIAGNOSIS — I10 Essential (primary) hypertension: Secondary | ICD-10-CM

## 2019-05-28 DIAGNOSIS — R413 Other amnesia: Secondary | ICD-10-CM

## 2019-06-03 ENCOUNTER — Other Ambulatory Visit: Payer: Self-pay

## 2019-06-03 ENCOUNTER — Telehealth: Payer: Self-pay

## 2019-06-03 DIAGNOSIS — Z049 Encounter for examination and observation for unspecified reason: Secondary | ICD-10-CM | POA: Diagnosis not present

## 2019-06-03 DIAGNOSIS — I10 Essential (primary) hypertension: Secondary | ICD-10-CM

## 2019-06-03 DIAGNOSIS — G4700 Insomnia, unspecified: Secondary | ICD-10-CM

## 2019-06-03 MED ORDER — SIMVASTATIN 40 MG PO TABS: 40.0000 mg | ORAL_TABLET | Freq: Every day | ORAL | 1 refills | Status: DC

## 2019-06-03 MED ORDER — LISINOPRIL 30 MG PO TABS: 30.0000 mg | ORAL_TABLET | Freq: Every day | ORAL | 1 refills | Status: DC

## 2019-06-03 MED ORDER — ALBUTEROL SULFATE HFA 108 (90 BASE) MCG/ACT IN AERS: 1.0000 | INHALATION_SPRAY | Freq: Four times a day (QID) | RESPIRATORY_TRACT | 5 refills | Status: AC | PRN

## 2019-06-03 MED ORDER — MIRTAZAPINE 15 MG PO TABS: 15.0000 mg | ORAL_TABLET | Freq: Every day | ORAL | 1 refills | Status: DC

## 2019-06-03 MED ORDER — OMEPRAZOLE 20 MG PO CPDR: DELAYED_RELEASE_CAPSULE | ORAL | 1 refills | Status: DC

## 2019-06-03 MED ORDER — LORATADINE 10 MG PO TABS: 10.0000 mg | ORAL_TABLET | Freq: Every day | ORAL | 0 refills | Status: DC

## 2019-06-03 MED ORDER — LEVOTHYROXINE SODIUM 100 MCG PO TABS: 100.0000 ug | ORAL_TABLET | Freq: Every day | ORAL | 1 refills | Status: DC

## 2019-06-03 MED ORDER — NAMZARIC 28-10 MG PO CP24: 1.0000 | ORAL_CAPSULE | Freq: Every day | ORAL | 0 refills | Status: DC

## 2019-06-03 NOTE — Patient Instructions (Signed)
Visit Information  Goals Addressed            This Visit's Progress   . "it would be easier if she had pill packs"       Daughter stated:  Current Barriers:  . iADL limitations related to patients impaired vision . Limited access to caregiver- the patient's son check on her daily to provide medication management after work, reported difficulty with medication management surrounding time frames . Memory Deficits - daughter reports the patient has early signs of dementia  Chronic Care Management Goal(s):  [name] Over the next 45 days, patient will work with embedded PharmD to address needs related to medication management  CCM SW Interventions: Completed 04/05/2019 . Outbound call placed to the patients daughter [name] to assess progression of patient goal. . Determined Jordan [name] has not yet received an e-mail from embedded pharmD and reports leaving Jordan [name] a voice message requesting follow up on pill packaging system. . Collaboration with [name] via in basket message requesting phone call follow up to the patients daughter.   CCM PharmD Interventions: Completed call with patient's daughter on 05/28/2019 . Patient's daughter is requesting pill packaging for patient  . Will explore options and patient currently gets 90 day fills.  Do not want copay to increase for pill packaging as they only run monthly copays. . Patient's daughter would like to try [facility] as she likes their pill card system.  Patient is stable on current medications and dosing. [name] ERXs sent to [facility] pharmacy.  Asked the staff to transfer the remaining Rxs from [facility].   . Will f/u in 5 business days   Patient Self Care Activities:  . Currently UNABLE TO independently administer own medications  Please see past updates related to this goal by clicking on the "Past Updates" button in the selected goal         The patient verbalized understanding of instructions provided today and  declined a print copy of patient instruction materials.   The care management team will reach out to the patient again over the next 5 days.   SIGNATURE Jordan [name], PharmD, BCPS Clinical Pharmacist, [facility] [place]: [phone]

## 2019-06-03 NOTE — Progress Notes (Signed)
Chronic Care Management   Visit Note  05/28/2019 Name: Jordan [name] MRN: [id number] DOB: November 14, 1933  Referred by: Arnette Felts, FNP Reason for referral : Chronic Care Management   Jordan [name] is a 85 y.o. year old female who is a primary care patient of Arnette Felts, FNP. The CCM team was consulted for assistance with chronic disease management and care coordination needs related to Dementia  Review of patient status, including review of consultants reports, relevant laboratory and other test results, and collaboration with appropriate care team members and the patient's provider was performed as part of comprehensive patient evaluation and provision of chronic care management services.    I spoke with patient's daughter by telephone today  Medications: Outpatient Encounter Medications as of 05/28/2019  Medication Sig  . atropine 1 % ophthalmic solution INSTILL 1 GTT INTO OS D  . bacitracin ointment Apply to affected area daily  . brimonidine-timolol (COMBIGAN) 0.2-0.5 % ophthalmic solution Place 1 drop into both eyes every 12 (twelve) hours.  [name] doxycycline (VIBRA-TABS) 100 MG tablet Take 1 tablet (100 mg total) by mouth 2 (two) times daily.  . ENBREL SURECLICK 50 MG/ML injection INJECT CONTENTS OF 1 SYRINGE UNDER THE SKIN ONCE WEEKLY  . fluticasone (FLONASE) 50 MCG/ACT nasal spray INSTILL 2 SPRAYS IN EACH NOSTRIL EVERY DAY  . folic acid (FOLVITE) 1 MG tablet TAKE 1 TABLET(1 MG) BY MOUTH DAILY  . HYDROcodone-acetaminophen (NORCO) 10-325 MG tablet Take 1 tablet by mouth every 6 (six) hours as needed.  . latanoprost (XALATAN) 0.005 % ophthalmic solution Place 1 drop into both eyes nightly.  . QUEtiapine (SEROQUEL) 25 MG tablet Take 2 tablets (50 mg total) by mouth at bedtime.  . [DISCONTINUED] albuterol (PROVENTIL HFA;VENTOLIN HFA) 108 (90 Base) MCG/ACT inhaler Inhale 1 puff into the lungs every 6 (six) hours as needed for wheezing or shortness of breath.  . [DISCONTINUED]  levothyroxine (SYNTHROID) 100 MCG tablet TAKE 1 TABLET BY MOUTH EVERY DAY  . [DISCONTINUED] lisinopril (ZESTRIL) 30 MG tablet Take 1 tablet (30 mg total) by mouth daily.  . [DISCONTINUED] loratadine (CLARITIN) 10 MG tablet Take 1 tablet (10 mg total) by mouth daily.  . [DISCONTINUED] mirtazapine (REMERON) 15 MG tablet Take 1 tablet (15 mg total) by mouth at bedtime.  . [DISCONTINUED] NAMZARIC 28-10 MG CP24 Take 1 capsule by mouth daily.  . [DISCONTINUED] omeprazole (PRILOSEC) 20 MG capsule TAKE 1 CAPSULE BY MOUTH EVERY DAY  . [DISCONTINUED] simvastatin (ZOCOR) 40 MG tablet TAKE 1 TABLET BY MOUTH AT BEDTIME   No facility-administered encounter medications on file as of 05/28/2019.      Objective:   Goals Addressed            This Visit's Progress   . "it would be easier if she had pill packs"       Daughter stated:  Current Barriers:  . iADL limitations related to patients impaired vision . Limited access to caregiver- the patient's son check on her daily to provide medication management after work, reported difficulty with medication management surrounding time frames . Memory Deficits - daughter reports the patient has early signs of dementia  Chronic Care Management Goal(s):  [name] Over the next 45 days, patient will work with embedded PharmD to address needs related to medication management  CCM SW Interventions: Completed 04/05/2019 . Outbound call placed to the patients daughter Jordan [name] to assess progression of patient goal. . Determined Jordan [name] has not yet received an e-mail from embedded pharmD and reports leaving Jordan.  Jordan [name] a voice message requesting follow up on pill packaging system. . Collaboration with [name] via in basket message requesting phone call follow up to the patients daughter.   CCM PharmD Interventions: Completed call with patient's daughter on 05/28/2019 . Patient's daughter is requesting pill packaging for patient  . Will explore options and  patient currently gets 90 day fills.  Do not want copay to increase for pill packaging as they only run monthly copays. . Patient's daughter would like to try [facility] as she likes their pill card system.  Patient is stable on current medications and dosing. [name] ERXs sent to [facility] pharmacy.  Asked the staff to transfer the remaining Rxs from [facility].   . Will f/u in 5 business days   Patient Self Care Activities:  . Currently UNABLE TO independently administer own medications  Please see past updates related to this goal by clicking on the "Past Updates" button in the selected goal          Plan:   The care management team will reach out to the patient again over the next 5 days.     Provider Signature Regina Eck, PharmD, BCPS Clinical Pharmacist, [facility] [place]: [phone]

## 2019-06-04 ENCOUNTER — Ambulatory Visit (INDEPENDENT_AMBULATORY_CARE_PROVIDER_SITE_OTHER): Payer: Medicare Other | Admitting: Pharmacist

## 2019-06-04 DIAGNOSIS — I10 Essential (primary) hypertension: Secondary | ICD-10-CM

## 2019-06-04 DIAGNOSIS — E039 Hypothyroidism, unspecified: Secondary | ICD-10-CM

## 2019-06-04 DIAGNOSIS — R413 Other amnesia: Secondary | ICD-10-CM

## 2019-06-05 ENCOUNTER — Telehealth: Payer: Self-pay | Admitting: Pharmacist

## 2019-06-06 NOTE — Progress Notes (Signed)
Chronic Care Management   Visit Note  06/04/2019 Name: Jordan [name] MRN: [id number] DOB: 1933-11-26  Referred by: Jordan Brine, FNP Reason for referral : Chronic Care Management   Jordan [name] is a 85 y.o. year old female who is a primary care patient of Jordan [name], [name]. The CCM team was consulted for assistance with chronic disease management and care coordination needs related to Dementia  Review of patient status, including review of consultants reports, relevant laboratory and other test results, and collaboration with appropriate care team members and the patient's provider was performed as part of comprehensive patient evaluation and provision of chronic care management services.    I spoke with Jordan [name] daughter by telephone today.  Medications: Outpatient Encounter Medications as of 06/04/2019  Medication Sig  . albuterol (VENTOLIN HFA) 108 (90 Base) MCG/ACT inhaler Inhale 1 puff into the lungs every 6 (six) hours as needed for wheezing or shortness of breath.  [name] atropine 1 % ophthalmic solution INSTILL 1 GTT INTO OS D  . bacitracin ointment Apply to affected area daily  . brimonidine-timolol (COMBIGAN) 0.2-0.5 % ophthalmic solution Place 1 drop into both eyes every 12 (twelve) hours.  . ENBREL SURECLICK 50 MG/ML injection INJECT CONTENTS OF 1 SYRINGE UNDER THE SKIN ONCE WEEKLY  . fluticasone (FLONASE) 50 MCG/ACT nasal spray INSTILL 2 SPRAYS IN EACH NOSTRIL EVERY DAY  . folic acid (FOLVITE) 1 MG tablet TAKE 1 TABLET(1 MG) BY MOUTH DAILY  . HYDROcodone-acetaminophen (NORCO) 10-325 MG tablet Take 1 tablet by mouth every 6 (six) hours as needed.  . latanoprost (XALATAN) 0.005 % ophthalmic solution Place 1 drop into both eyes nightly.  . levothyroxine (SYNTHROID) 100 MCG tablet Take 1 tablet (100 mcg total) by mouth daily.  [name] lisinopril (ZESTRIL) 30 MG tablet Take 1 tablet (30 mg total) by mouth daily.  [name] loratadine (CLARITIN) 10 MG tablet Take 1 tablet (10 mg  total) by mouth daily.  . mirtazapine (REMERON) 15 MG tablet Take 1 tablet (15 mg total) by mouth at bedtime.  [name] NAMZARIC 28-10 MG CP24 Take 1 capsule by mouth daily.  [name] omeprazole (PRILOSEC) 20 MG capsule TAKE 1 CAPSULE BY MOUTH EVERY DAY  . simvastatin (ZOCOR) 40 MG tablet Take 1 tablet (40 mg total) by mouth at bedtime.  . [DISCONTINUED] doxycycline (VIBRA-TABS) 100 MG tablet Take 1 tablet (100 mg total) by mouth 2 (two) times daily.  . [DISCONTINUED] QUEtiapine (SEROQUEL) 25 MG tablet Take 2 tablets (50 mg total) by mouth at bedtime.   No facility-administered encounter medications on file as of 06/04/2019.      Objective:   Goals Addressed            This Visit's Progress   . "it would be easier if she had pill packs"       Daughter stated:  Current Barriers:  . iADL limitations related to patients impaired vision . Limited access to caregiver- the patient's son check on her daily to provide medication management after work, reported difficulty with medication management surrounding time frames . Memory Deficits - daughter reports the patient has early signs of dementia  Chronic Care Management Goal(s):  [name] Over the next 45 days, patient will work with embedded PharmD to address needs related to medication management  CCM SW Interventions: Completed 04/05/2019 . Outbound call placed to the patients daughter Jordan [name] to assess progression of patient goal. . Determined Jordan [name] has not yet received an e-mail from embedded pharmD and reports leaving Jordan [name]  a voice message requesting follow up on pill packaging system. . Collaboration with Jordan [name] via in basket message requesting phone call follow up to the patients daughter.   CCM PharmD Interventions: Completed call with patient's daughter, Jordan [name], on 06/03/2019 . Patient's daughter is requesting pill packaging for patient  . Will explore options and patient currently gets 90 day fills.  Do not want  copay to increase for pill packaging as they only run monthly copays vs 47-month supplies. . Patient's daughter would like to try [name] as she likes their pill card system.  Patient is stable on current medications and dosing. [name] ERXs sent to [name].  Asked the staff to transfer the remaining Rxs from [name].   o Pill packaging ready to be delivered to the patient o [name] has short filled 1-week supply for patient to get started.  They will then deliver 3 week supply in pill packaging next week.  I spoke with Jordan [name], CPhT, who stated everything was in line. o Will need to get enbrel RX called in by Rheum.  Patient is no longer on MTX/folic acid. o Medication list updated in EMR.   Patient Self Care Activities:  . Currently UNABLE TO independently administer own medications  Please see past updates related to this goal by clicking on the "Past Updates" button in the selected goal         Plan:   The care management team will reach out to the patient again over the next 30 days.   Provider Signature Jordan [name], PharmD, BCPS Clinical Pharmacist, [name]  [name]  Direct Dial: [phone]

## 2019-06-06 NOTE — Patient Instructions (Signed)
Visit Information  Goals Addressed            This Visit's Progress   . "it would be easier if she had pill packs"       Daughter stated:  Current Barriers:  . iADL limitations related to patients impaired vision . Limited access to caregiver- the patient's son check on her daily to provide medication management after work, reported difficulty with medication management surrounding time frames . Memory Deficits - daughter reports the patient has early signs of dementia  Chronic Care Management Goal(s):  [name] Over the next 45 days, patient will work with embedded PharmD to address needs related to medication management  CCM SW Interventions: Completed 04/05/2019 . Outbound call placed to the patients daughter [name] to assess progression of patient goal. . Determined [name] has not yet received an e-mail from embedded pharmD and reports leaving Mrs. Rojean Ige a voice message requesting follow up on pill packaging system. . Collaboration with [name] via in basket message requesting phone call follow up to the patients daughter.   CCM PharmD Interventions: Completed call with patient's daughter, [name], on 06/03/2019 . Patient's daughter is requesting pill packaging for patient  . Will explore options and patient currently gets 90 day fills.  Do not want copay to increase for pill packaging as they only run monthly copays vs 25-month supplies. . Patient's daughter would like to try [facility] as she likes their pill card system.  Patient is stable on current medications and dosing. [name] ERXs sent to [facility] pharmacy.  Asked the staff to transfer the remaining Rxs from [facility].   o Pill packaging ready to be delivered to the patient o [facility] [address] has short filled 1-week supply for patient to get started.  They will then deliver 3 week supply in pill packaging next week.  I spoke with [name], CPhT, who stated everything was in  line. o Will need to get enbrel RX called in by Rheum.  Patient is no longer on MTX/folic acid. o Medication list updated in EMR.   Patient Self Care Activities:  . Currently UNABLE TO independently administer own medications  Please see past updates related to this goal by clicking on the "Past Updates" button in the selected goal         The patient verbalized understanding of instructions provided today and declined a print copy of patient instruction materials.   The care management team will reach out to the patient again over the next 30 days.   SIGNATURE Regina Eck, PharmD, BCPS Clinical Pharmacist, [facility] [place]: [phone]

## 2019-06-12 ENCOUNTER — Other Ambulatory Visit: Payer: Self-pay | Admitting: *Deleted

## 2019-06-12 MED ORDER — ENBREL SURECLICK 50 MG/ML ~~LOC~~ SOAJ: 50.0000 mg | SUBCUTANEOUS | 0 refills | Status: DC

## 2019-06-12 NOTE — Telephone Encounter (Signed)
ok 

## 2019-06-12 NOTE — Telephone Encounter (Signed)
Refill request received via fax  Last Visit: 03/14/19 Next Visit: 08/15/19  Labs: 7/23/20She has low creatinine TB Gold: 04/05/18 Neg   Patient advised she is due to update labs. Patient states she will talk to her daughter and come to update.   Okay to refill 30 day supply Enbrel?

## 2019-06-13 ENCOUNTER — Telehealth: Payer: Self-pay | Admitting: Rheumatology

## 2019-06-13 NOTE — Telephone Encounter (Signed)
Attempted to contact patient and left message for patient to call the office. Refill request was received from [facility]. Refill was sent to [facility]. Need to verify with patient her pharmacy.

## 2019-06-13 NOTE — Telephone Encounter (Signed)
Jordan [name] from [facility] left a voicemail stating they have been trying to fax a new prescription of Enbrel to [phone] and they are not able to get it to go through.  Jordan [name] states patient needs a refill by Saturday, 06/15/19 and is requesting a return call at [phone]

## 2019-06-14 ENCOUNTER — Other Ambulatory Visit: Payer: Self-pay

## 2019-06-14 DIAGNOSIS — Z79899 Other long term (current) drug therapy: Secondary | ICD-10-CM

## 2019-06-16 LAB — CBC WITH DIFFERENTIAL/PLATELET
Absolute Monocytes: 330 cells/uL (ref 200–950)
Basophils Absolute: 60 cells/uL (ref 0–200)
Basophils Relative: 0.6 %
Eosinophils Absolute: 30 cells/uL (ref 15–500)
Eosinophils Relative: 0.3 %
HCT: 39.9 % (ref 35.0–45.0)
Hemoglobin: 12.8 g/dL (ref 11.7–15.5)
Lymphs Abs: 5080 cells/uL — ABNORMAL HIGH (ref 850–3900)
MCH: 24.5 pg — ABNORMAL LOW (ref 27.0–33.0)
MCHC: 32.1 g/dL (ref 32.0–36.0)
MCV: 76.3 fL — ABNORMAL LOW (ref 80.0–100.0)
MPV: 11.2 fL (ref 7.5–12.5)
Monocytes Relative: 3.3 %
Neutro Abs: 4500 cells/uL (ref 1500–7800)
Neutrophils Relative %: 45 %
Platelets: 249 10*3/uL (ref 140–400)
RBC: 5.23 10*6/uL — ABNORMAL HIGH (ref 3.80–5.10)
RDW: 19.6 % — ABNORMAL HIGH (ref 11.0–15.0)
Total Lymphocyte: 50.8 %
WBC: 10 10*3/uL (ref 3.8–10.8)

## 2019-06-16 LAB — COMPLETE METABOLIC PANEL WITH GFR
AG Ratio: 1.3 (calc) (ref 1.0–2.5)
ALT: 15 U/L (ref 6–29)
AST: 28 U/L (ref 10–35)
Albumin: 3.9 g/dL (ref 3.6–5.1)
Alkaline phosphatase (APISO): 50 U/L (ref 37–153)
BUN/Creatinine Ratio: 11 (calc) (ref 6–22)
BUN: 17 mg/dL (ref 7–25)
CO2: 25 mmol/L (ref 20–32)
Calcium: 8.7 mg/dL (ref 8.6–10.4)
Chloride: 99 mmol/L (ref 98–110)
Creat: 1.49 mg/dL — ABNORMAL HIGH (ref 0.60–0.88)
GFR, Est African American: 37 mL/min/{1.73_m2} — ABNORMAL LOW (ref >=60)
GFR, Est Non African American: 32 mL/min/{1.73_m2} — ABNORMAL LOW (ref >=60)
Globulin: 3 g/dL (calc) (ref 1.9–3.7)
Glucose, Bld: 76 mg/dL (ref 65–99)
Potassium: 4.8 mmol/L (ref 3.5–5.3)
Sodium: 136 mmol/L (ref 135–146)
Total Bilirubin: 0.5 mg/dL (ref 0.2–1.2)
Total Protein: 6.9 g/dL (ref 6.1–8.1)

## 2019-06-16 LAB — QUANTIFERON-TB GOLD PLUS
Mitogen-NIL: >10 IU/mL
NIL: 0.03 IU/mL
QuantiFERON-TB Gold Plus: NEGATIVE
TB1-NIL: 0.02 IU/mL
TB2-NIL: 0.02 IU/mL

## 2019-06-17 NOTE — Progress Notes (Signed)
Her creatinine has increased.  It is unlikely to be due to Enbrel as she has been taking it for a while.  Please have her contact her PCP's office for evaluation.

## 2019-06-17 NOTE — Progress Notes (Signed)
TB gold negative

## 2019-06-18 ENCOUNTER — Telehealth: Payer: Self-pay

## 2019-06-18 MED ORDER — ENBREL SURECLICK 50 MG/ML ~~LOC~~ SOAJ: 50.0000 mg | SUBCUTANEOUS | 0 refills | Status: DC

## 2019-06-18 NOTE — Telephone Encounter (Signed)
Refill request received via fax from [facility].  Prescription on 06/12/2019 was sent to incorrect pharmacy.   Last Visit: 03/14/2019 Next Visit: 08/15/2019 Labs: 06/14/2019 Her creatinine has increased. It is unlikely to be due to Enbrel as she has been taking it for a while. TB Gold: 06/14/2019 TB gold negative.   Okay to refill per Dr. Estanislado Pandy.

## 2019-06-24 ENCOUNTER — Ambulatory Visit (INDEPENDENT_AMBULATORY_CARE_PROVIDER_SITE_OTHER): Payer: Medicare Other | Admitting: Nurse Practitioner

## 2019-06-24 ENCOUNTER — Ambulatory Visit: Payer: Self-pay | Admitting: Pharmacist

## 2019-06-24 ENCOUNTER — Other Ambulatory Visit: Payer: Self-pay

## 2019-06-24 ENCOUNTER — Encounter: Payer: Self-pay | Admitting: Nurse Practitioner

## 2019-06-24 VITALS — BP 126/80 | HR 64 | Temp 97.9°F | Ht 59.2 in | Wt 104.8 lb

## 2019-06-24 DIAGNOSIS — E039 Hypothyroidism, unspecified: Secondary | ICD-10-CM

## 2019-06-24 DIAGNOSIS — I10 Essential (primary) hypertension: Secondary | ICD-10-CM

## 2019-06-24 DIAGNOSIS — R7989 Other specified abnormal findings of blood chemistry: Secondary | ICD-10-CM | POA: Diagnosis not present

## 2019-06-24 NOTE — Progress Notes (Signed)
Subjective:     Patient ID: Jordan [name] , female    DOB: September 27, 1933 , 85 y.o.   MRN: [id number]   Chief Complaint  Patient presents with  . Hypertension  . Hypothyroidism    HPI  Hypertension This is a chronic problem. The current episode started more than 1 year ago. The problem is unchanged. The problem is uncontrolled. Pertinent negatives include no anxiety or headaches. Agents associated with hypertension include thyroid hormones. Risk factors for coronary artery disease include sedentary lifestyle. Past treatments include ACE inhibitors. There are no compliance problems.  There is no history of angina. Identifiable causes of hypertension include a thyroid problem.  Thyroid Problem Presents for follow-up visit. Patient reports no anxiety or fatigue.     Past Medical History:  Diagnosis Date  . Allergy   . Anxiety    per pt's daughter  . Arthritis   . Asthma    pt reported  . Depression   . GERD (gastroesophageal reflux disease)   . Hearing deficit L  . High cholesterol   . Hypertension   . Osteoporosis   . Stroke [facility])      No family history on file.   Current Outpatient Medications:  .  albuterol (VENTOLIN HFA) 108 (90 Base) MCG/ACT inhaler, Inhale 1 puff into the lungs every 6 (six) hours as needed for wheezing or shortness of breath., Disp: 6.7 g, Rfl: 5 .  atropine 1 % ophthalmic solution, INSTILL 1 GTT INTO OS D, Disp: , Rfl: 5 .  brimonidine-timolol (COMBIGAN) 0.2-0.5 % ophthalmic solution, Place 1 drop into both eyes every 12 (twelve) hours., Disp: , Rfl:  .  etanercept (ENBREL SURECLICK) 50 MG/ML injection, Inject 0.98 mLs (50 mg total) into the skin once a week., Disp: 12 pen, Rfl: 0 .  fluticasone (FLONASE) 50 MCG/ACT nasal spray, INSTILL 2 SPRAYS IN EACH NOSTRIL EVERY DAY, Disp: 16 g, Rfl: 1 .  folic acid (FOLVITE) 1 MG tablet, TAKE 1 TABLET(1 MG) BY MOUTH DAILY, Disp: 90 tablet, Rfl: 0 .  HYDROcodone-acetaminophen (NORCO) 10-325 MG tablet, Take 1  tablet by mouth every 6 (six) hours as needed., Disp: , Rfl:  .  latanoprost (XALATAN) 0.005 % ophthalmic solution, Place 1 drop into both eyes nightly., Disp: , Rfl:  .  levothyroxine (SYNTHROID) 100 MCG tablet, Take 1 tablet (100 mcg total) by mouth daily., Disp: 90 tablet, Rfl: 1 .  lisinopril (ZESTRIL) 30 MG tablet, Take 1 tablet (30 mg total) by mouth daily., Disp: 90 tablet, Rfl: 1 .  loratadine (CLARITIN) 10 MG tablet, Take 1 tablet (10 mg total) by mouth daily., Disp: 90 tablet, Rfl: 0 .  mirtazapine (REMERON) 15 MG tablet, Take 1 tablet (15 mg total) by mouth at bedtime., Disp: 90 tablet, Rfl: 1 .  NAMZARIC 28-10 MG CP24, Take 1 capsule by mouth daily., Disp: 90 capsule, Rfl: 0 .  omeprazole (PRILOSEC) 20 MG capsule, TAKE 1 CAPSULE BY MOUTH EVERY DAY, Disp: 90 capsule, Rfl: 1 .  simvastatin (ZOCOR) 40 MG tablet, Take 1 tablet (40 mg total) by mouth at bedtime., Disp: 90 tablet, Rfl: 1 .  bacitracin ointment, Apply to affected area daily (Patient not taking: Reported on 06/24/2019), Disp: 30 g, Rfl: 0   Allergies  Allergen Reactions  . Penicillins Hives     Review of Systems  Constitutional: Negative.  Negative for fatigue.  HENT:       Legally blind  Respiratory: Negative.   Cardiovascular: Negative.   Neurological: Negative for dizziness,  syncope, weakness and headaches.  Psychiatric/Behavioral: Negative.  The patient is not nervous/anxious.      Today's Vitals   06/24/19 1146  BP: 126/80  Pulse: 64  Temp: 97.9 F (36.6 C)  TempSrc: Oral  Weight: 104 lb 12.8 oz (47.5 kg)  Height: 4' 11.2" (1.504 m)  PainSc: 0-No pain   Body mass index is 21.02 kg/m.   Objective:  Physical Exam Vitals signs reviewed.  Constitutional:      Appearance: Normal appearance.  Cardiovascular:     Rate and Rhythm: Normal rate and regular rhythm.     Pulses: Normal pulses.     Heart sounds: Normal heart sounds. No murmur.  Pulmonary:     Effort: Pulmonary effort is normal. No  respiratory distress.     Breath sounds: Normal breath sounds.  Skin:    Capillary Refill: Capillary refill takes less than 2 seconds.  Neurological:     General: No focal deficit present.     Mental Status: She is alert and oriented to person, place, and time.  Psychiatric:        Mood and Affect: Mood normal.        Behavior: Behavior normal.        Thought Content: Thought content normal.        Judgment: Judgment normal.         Assessment And Plan:     1. Acquired hypothyroidism  She did not stop for labs at her visit in July, will recheck thyroid levels today  My concern is her thyroid levels are abnormal  Her daughter reports her brother has been administering her medications  Will refer to CCM for assistance with medications - TSH - T3 - T4, Free  2. Uncontrolled hypertension  This has increased in recent weeks, advised her to restart her lisinopril as the last visit her blood pressure was low.  She is to return in 2 weeks for blood pressure check - BMP8+eGFR  1. Essential hypertension  Chronic, her blood pressure has improved this visit.  Continue with current medications  2. Acquired hypothyroidism  Chronic, not well controlled  She did not come back for repeat levels after 4 weeks  Will check thyroid levels today - TSH - T3 - T4, Free  3. Elevated serum creatinine  Elevated at her last blood draw with another provider  I have advised her to increase her water intake and to keep her blood pressure well controlled  Will recheck in a few weeks.     [name], FNP    THE PATIENT IS ENCOURAGED TO PRACTICE SOCIAL DISTANCING DUE TO THE COVID-19 PANDEMIC.

## 2019-06-25 ENCOUNTER — Encounter: Payer: Self-pay | Admitting: Nurse Practitioner

## 2019-06-25 LAB — T3: T3, Total: 91 ng/dL (ref 71–180)

## 2019-06-25 LAB — TSH: TSH: 78.4 u[IU]/mL — ABNORMAL HIGH (ref 0.450–4.500)

## 2019-06-25 LAB — T4, FREE: Free T4: 1.33 ng/dL (ref 0.82–1.77)

## 2019-06-26 NOTE — Patient Instructions (Signed)
Visit Information  Goals Addressed            This Visit's Progress   . "it would be easier if she had pill packs"       Daughter stated:  Current Barriers:  . iADL limitations related to patients impaired vision . Limited access to caregiver- the patient's son check on her daily to provide medication management after work, reported difficulty with medication management surrounding time frames . Memory Deficits - daughter reports the patient has early signs of dementia  Chronic Care Management Goal(s):  [name] Over the next 45 days, patient will work with embedded PharmD to address needs related to medication management  CCM SW Interventions: Completed 04/05/2019 . Outbound call placed to the patients daughter [name] to assess progression of patient goal. . Determined [name] has not yet received an e-mail from embedded pharmD and reports leaving Mrs. Carah Barrientes a voice message requesting follow up on pill packaging system. . Collaboration with [name] via in basket message requesting phone call follow up to the patients daughter.   CCM PharmD Interventions: Completed call with patient's daughter, [name], on 06/24/2019 . Patient's daughter is requesting pill packaging for patient  . Will explore options and patient currently gets 90 day fills.  Do not want copay to increase for pill packaging as they only run monthly copays vs 42-month supplies. . Patient's daughter would like to try [facility] as she likes their pill card system.  Patient is stable on current medications and dosing. [name] ERXs sent to [facility] pharmacy.  Asked the staff to transfer the remaining Rxs from [facility].   o Pill packaging ready to be delivered to the patient o [facility] [address] has delivered first month of pill packs on 06/24/19.  Patient to start today. o Will need to get enbrel RX called in by Rheum.  Patient is no longer on MTX/folic acid. o Medication list  updated in EMR.   Patient Self Care Activities:  . Currently UNABLE TO independently administer own medications  Please see past updates related to this goal by clicking on the "Past Updates" button in the selected goal      . I need to manage my thyroid condition       Current Barriers:  [name] Knowledge Deficits related to disease state management of hypothyroidism  Pharmacist Clinical Goal(s):  [name] Over the next 90 days, patient will demonstrate Improved medication adherence as evidenced by starting to take thyroid medication in the morning separate as prescribed  Interventions: . Comprehensive medication review performed. . Counseled daughter to encourage patient to take thyroid medication in the mornings (30-60 min separate from food and other medications). . Explained that patient's thyroid levels have been variable.  Explained that compliance can be an issue.  We are hopeful pill packs will help with this situation . TSH=78 . Discussed with PCP, who may want to initiate brand name Synthroid for more steady levels . Will initiate brand name Synthroid next month . Will follow up  Patient Self Care Activities:  . Unable to independently take medications as prescribed . Unable to self administer medications as prescribed  Initial goal documentation     . I would like to manage my mother's blood pressure       Current Barriers:  [name] Knowledge Deficits related to disease state management of blood pressure and Non Adherence to prescribed medication regimen . Latest blood pressure reading 126/80 at last PCP visit (was  162/100 at previous OV) . Scr 0.71 to 1.49 . Current anti-hypertensives: lisinopril . Per PCP notes in 05/2019: o This is a chronic problem. The current episode started more than 1 year ago. The problem is unchanged. The problem is uncontrolled. Pertinent negatives include no anxiety or headaches. Agents associated with hypertension include thyroid hormones. Risk factors  for coronary artery disease include sedentary lifestyle. Past treatments include ACE inhibitors. There are no compliance problems.  There is no history of angina. Identifiable causes of hypertension include a thyroid problem.   Pharmacist Clinical Goal(s):  [name] Over the next 90 days, patient will work with CCM team and PCP to address needs related to optimized medication management of blood pressure  Interventions: . Comprehensive medication review performed. . Pill packs to aid in compliance.  Daughter to come in to town mid-December to check pill packaging compliance . Patient is not taking lisinopril as prescribed. She is unable to check BP independently, however has a caregiver that can assist with    . Follow Scr 0.71 to 1.49.  Discussed with PCP as patient is on ACEi  Patient Self Care Activities:  . Unable to independently take medications and blood pressure readings as prescribed . Unable to self administer medications as prescribed  Initial goal documentation        The patient verbalized understanding of instructions provided today and declined a print copy of patient instruction materials.   The care management team will reach out to the patient again over the next 30 days.   SIGNATURE Kieth Brightly, PharmD, BCPS Clinical Pharmacist, [facility]  [facility]  Direct Dial: [phone]

## 2019-06-26 NOTE — Progress Notes (Signed)
Chronic Care Management   Visit Note  06/24/2019 Name: Jordan [name] MRN: [id number] DOB: 02-Nov-1933  Referred by: Arnette Felts, FNP Reason for referral : Chronic Care Management   Jordan [name] is a 85 y.o. year old female who is a primary care patient of Arnette Felts, FNP. The CCM team was consulted for assistance with chronic disease management and care coordination needs related to hypothyroidism  Review of patient status, including review of consultants reports, relevant laboratory and other test results, and collaboration with appropriate care team members and the patient's provider was performed as part of comprehensive patient evaluation and provision of chronic care management services.    I spoke with Ms. Olver daughter, Jordan [name], by telephone today.  Medications: Outpatient Encounter Medications as of 06/24/2019  Medication Sig   albuterol (VENTOLIN HFA) 108 (90 Base) MCG/ACT inhaler Inhale 1 puff into the lungs every 6 (six) hours as needed for wheezing or shortness of breath.   atropine 1 % ophthalmic solution INSTILL 1 GTT INTO OS D   bacitracin ointment Apply to affected area daily (Patient not taking: Reported on 06/24/2019)   brimonidine-timolol (COMBIGAN) 0.2-0.5 % ophthalmic solution Place 1 drop into both eyes every 12 (twelve) hours.   etanercept (ENBREL SURECLICK) 50 MG/ML injection Inject 0.98 mLs (50 mg total) into the skin once a week.   fluticasone (FLONASE) 50 MCG/ACT nasal spray INSTILL 2 SPRAYS IN EACH NOSTRIL EVERY DAY   folic acid (FOLVITE) 1 MG tablet TAKE 1 TABLET(1 MG) BY MOUTH DAILY   HYDROcodone-acetaminophen (NORCO) 10-325 MG tablet Take 1 tablet by mouth every 6 (six) hours as needed.   latanoprost (XALATAN) 0.005 % ophthalmic solution Place 1 drop into both eyes nightly.   levothyroxine (SYNTHROID) 100 MCG tablet Take 1 tablet (100 mcg total) by mouth daily.   lisinopril (ZESTRIL) 30 MG tablet Take 1 tablet (30 mg  total) by mouth daily.   loratadine (CLARITIN) 10 MG tablet Take 1 tablet (10 mg total) by mouth daily.   mirtazapine (REMERON) 15 MG tablet Take 1 tablet (15 mg total) by mouth at bedtime.   NAMZARIC 28-10 MG CP24 Take 1 capsule by mouth daily.   omeprazole (PRILOSEC) 20 MG capsule TAKE 1 CAPSULE BY MOUTH EVERY DAY   simvastatin (ZOCOR) 40 MG tablet Take 1 tablet (40 mg total) by mouth at bedtime.   No facility-administered encounter medications on file as of 06/24/2019.      Objective:   Goals Addressed            This Visit's Progress    "it would be easier if she had pill packs"       Daughter stated:  Current Barriers:   iADL limitations related to patients impaired vision  Limited access to caregiver- the patient's son check on her daily to provide medication management after work, reported difficulty with medication management surrounding time frames  Memory Deficits - daughter reports the patient has early signs of dementia  Chronic Care Management Goal(s):   Over the next 45 days, patient will work with embedded PharmD to address needs related to medication management  CCM SW Interventions: Completed 04/05/2019  Outbound call placed to the patients daughter Jordan [name] to assess progression of patient goal.  Determined Mrs [name] has not yet received an e-mail from embedded pharmD and reports leaving Mrs. Amila Callies a voice message requesting follow up on pill packaging system.  Collaboration with [name] via in basket message requesting phone call follow up to the patients  daughter.   CCM PharmD Interventions: Completed call with patient's daughter, Jordan [name], on 06/24/2019  Patient's daughter is requesting pill packaging for patient   Will explore options and patient currently gets 90 day fills.  Do not want copay to increase for pill packaging as they only run monthly copays vs 34-month supplies.  Patient's daughter would like to try [facility] as she likes their pill card system.  Patient is stable on current medications and dosing.  ERXs sent to [facility] pharmacy.  Asked the staff to transfer the remaining Rxs from [facility].   o Pill packaging ready to be delivered to the patient o [facility] on [address], [place] has delivered first month of pill packs on 06/24/19.  Patient to start today. o Will need to get enbrel RX called in by Rheum.  Patient is no longer on MTX/folic acid. o Medication list updated in EMR.   Patient Self Care Activities:   Currently UNABLE TO independently administer own medications  Please see past updates related to this goal by clicking on the "Past Updates" button in the selected goal       I need to manage my thyroid condition       Current Barriers:   Knowledge Deficits related to disease state management of hypothyroidism  Pharmacist Clinical Goal(s):   Over the next 90 days, patient will demonstrate Improved medication adherence as evidenced by starting to take thyroid medication in the morning separate as prescribed  Interventions:  Comprehensive medication review performed.  Counseled daughter to encourage patient to take thyroid medication in the mornings (30-60 min separate from food and other medications).  Explained that patient's thyroid levels have been variable.  Explained that compliance can be an issue.  We are hopeful pill packs will help with this situation  TSH=78  Discussed with PCP, who may want to initiate brand name Synthroid for more steady levels  Will initiate brand name Synthroid next month  Will follow up  Patient Self Care Activities:   Unable to independently take medications as prescribed  Unable to self administer medications as prescribed  Initial goal documentation      I would like to manage my mother's blood pressure       Current Barriers:   Knowledge Deficits related to disease state management of blood pressure  and Non Adherence to prescribed medication regimen  Latest blood pressure reading 126/80 at last PCP visit (was 162/100 at previous OV)  Scr 0.71 to 1.49  Current anti-hypertensives: lisinopril  Per PCP notes in 05/2019: o This is a chronic problem. The current episode started more than 1 year ago. The problem is unchanged. The problem is uncontrolled. Pertinent negatives include no anxiety or headaches. Agents associated with hypertension include thyroid hormones. Risk factors for coronary artery disease include sedentary lifestyle. Past treatments include ACE inhibitors. There are no compliance problems.  There is no history of angina. Identifiable causes of hypertension include a thyroid problem.   Pharmacist Clinical Goal(s):   Over the next 90 days, patient will work with CCM team and PCP to address needs related to optimized medication management of blood pressure  Interventions:  Comprehensive medication review performed.  Pill packs to aid in compliance.  Daughter to come in to town mid-December to check pill packaging compliance  Patient is not taking lisinopril as prescribed. She is unable to check BP independently, however has a caregiver that can assist with     Follow Scr 0.71 to 1.49.  Discussed with PCP as patient is on ACEi  Patient Self Care Activities:   Unable to independently take medications and blood pressure readings as prescribed  Unable to self administer medications as prescribed  Initial goal documentation        Plan:   The care management team will reach out to the patient again over the next 30 days.   Provider Signature Regina Eck, PharmD, BCPS Clinical Pharmacist, [facility] [place]: [phone]

## 2019-07-02 DIAGNOSIS — Z049 Encounter for examination and observation for unspecified reason: Secondary | ICD-10-CM | POA: Diagnosis not present

## 2019-07-10 ENCOUNTER — Telehealth: Payer: Self-pay | Admitting: Pharmacist

## 2019-07-13 ENCOUNTER — Other Ambulatory Visit: Payer: Self-pay | Admitting: Nurse Practitioner

## 2019-08-01 DIAGNOSIS — Z049 Encounter for examination and observation for unspecified reason: Secondary | ICD-10-CM | POA: Diagnosis not present

## 2019-08-02 ENCOUNTER — Ambulatory Visit (INDEPENDENT_AMBULATORY_CARE_PROVIDER_SITE_OTHER): Payer: Medicare Other

## 2019-08-02 ENCOUNTER — Telehealth: Payer: Self-pay

## 2019-08-02 DIAGNOSIS — R413 Other amnesia: Secondary | ICD-10-CM

## 2019-08-02 DIAGNOSIS — E559 Vitamin D deficiency, unspecified: Secondary | ICD-10-CM

## 2019-08-02 DIAGNOSIS — I10 Essential (primary) hypertension: Secondary | ICD-10-CM | POA: Diagnosis not present

## 2019-08-02 DIAGNOSIS — M0579 Rheumatoid arthritis with rheumatoid factor of multiple sites without organ or systems involvement: Secondary | ICD-10-CM

## 2019-08-02 DIAGNOSIS — F329 Major depressive disorder, single episode, unspecified: Secondary | ICD-10-CM | POA: Diagnosis not present

## 2019-08-02 DIAGNOSIS — G309 Alzheimer's disease, unspecified: Secondary | ICD-10-CM | POA: Diagnosis not present

## 2019-08-05 ENCOUNTER — Telehealth: Payer: Self-pay

## 2019-08-05 MED ORDER — SYNTHROID 100 MCG PO TABS: 100.0000 ug | ORAL_TABLET | Freq: Every day | ORAL | 0 refills | Status: DC

## 2019-08-06 ENCOUNTER — Ambulatory Visit: Payer: Self-pay

## 2019-08-06 DIAGNOSIS — R413 Other amnesia: Secondary | ICD-10-CM

## 2019-08-06 DIAGNOSIS — F32A Depression, unspecified: Secondary | ICD-10-CM

## 2019-08-06 DIAGNOSIS — F329 Major depressive disorder, single episode, unspecified: Secondary | ICD-10-CM

## 2019-08-06 NOTE — Patient Instructions (Signed)
Social Worker Visit Information  Goals we discussed today:  Goals Addressed            This Visit's Progress   . Assist with care coordination needs related to patients PCA hours covered under Medicaid benefit       Current Barriers:  [name] Memory loss contributing to ongoing depression Dx . Limited ability to perform ADL and iADL's in the home . Limited local family support . Declining functional status due to vision impairment  Social Work Clinical Goal(s):  [name] Over the next 45 days the patient and her daughter, [name], will work with SW to address concerns related to increasing PCA hours  CCM SW Interventions: Completed 08/06/19 . Communication received from RN Case Manager requesting SW involvement to assist with increasing PCA hours . Successful outbound call placed to [name], the patients daughter who assists with ongoing care coordination needs . Reviewed Mrs. Terri Skains concern over the patient answering assessment questions incorrectly due to memory impairment which caused the patient to experience a reduction in PCA hours . Discussed patients current PCA approval hours t obe 3 hours per day 6 days a week and 1 day with 4 hours. The patient receives care from same aide "Burna Mortimer" who the patient is very fond of . Educated [name] on the opportunity to qualify for more hours due to patients on-going cognitive decline due to memory loss and depression o Patients physical functioning level has also changed due to the patients ongoing vision impairment which is now causing difficulty with accessing the shower . Advised Mrs [name], [place] could assist with a change of status application to arrange an assessment with [employer] o Mrs [name] reports she feels it is time for yearly re-certification . Unsuccessful outbound call placed to [facility] to determine if re-certification has been scheduled . Plan to submit a change of status application pending outcome  of [facility] care call  Patient Self Care Activities:  . Patient with memory impairment, daughter states understanding of plan  . Unable to perform ADLs independently . Unable to perform IADLs independently .    Initial goal documentation         Materials Provided: Verbal education about PCA application process provided by phone  Follow Up Plan: SW will follow up with patient by phone over the next week   Bevelyn Ngo, BSW, CDP Social Worker, Certified Dementia Practitioner [facility] Care Management [phone]

## 2019-08-06 NOTE — Patient Instructions (Signed)
Visit Information  Goals Addressed    . "I am not sure what Vitamin D dosage mother should be taking"       Daughter stated Current Barriers:  Jordan [name] Knowledge Deficits related to evaluation and treatment of Vitamin D deficiency . Chronic Disease Management support and education needs related to HTN, RN, Acquired Hypothyroidism, Vitamin D deficiency, Memory loss   Nurse Case Manager Clinical Goal(s):  Jordan [name] Over the next 90 days, patient will verbalize understanding of plan for evaluation and treatment of Vitamin D deficiency  CCM RN CM Interventions:  08/05/19 call completed with patient's daughter Jordan [name]  . Evaluation of current treatment plan related to Vitamin D deficiency and patient's adherence to plan as established by provider. . Provided education to patient re: the importance of raising the Vitamin D level to help with immune and bone support  . Reviewed medications with patient and discussed patient is not currently prescribed Vitamin D but has been deficient in the past; discussed f/u with PCP to ask for recommendations . Collaborated with PCP [name], FNP regarding recommendations for Vitamin D due to deficiency and no recent lab value . Discussed plans with patient for ongoing care management follow up and provided patient with direct contact information for care management team  Patient Self Care Activities:  . Unable to independently take medications as prescribed . Unable to self administer medications as prescribed  Initial goal documentation     . "I need help getting an upright walker for mother'       Daughter stated Current Barriers:  Jordan [name] Knowledge Deficits related to how to obtain DME, specifically an upright walker . Chronic Disease Management support and education needs related to HTN, RA, Vitamin D deficiency, Acquired Hypothyroidism, Memory loss   Nurse Case Manager Clinical Goal(s):  Jordan [name] Over the next 90 days, patient will work with the [facility] [place] and PCP to address  needs related to DME and or other home safety equipment  CCM RN CM Interventions:  08/05/19 call completed with patient's daughter Jordan [name] . Evaluation of current treatment plan related to DME and home safety and patient's adherence to plan as established by provider. [name] with PCP [name], FNP regarding Rx needed for an upright walker; instructed [name] to send the Rx to [facility] and provided the following fax # [phone] . Discussed plans with patient for ongoing care management follow up and provided patient with direct contact information for care management team . Assessed for falls and or near falls; assessed for fall risk and or other home safety concerns . Determined patient has experienced no recent falls; discussed daughter does feel that she needs additional PCA hours, currently only receiving 3-3.5 hours on weekdays and family feels her dementia has worsened . Sent in basket message to embedded BSW Daneen Schick requesting she reach out to [name] to offer assistance with how to navigate and initiate the request for additional PCA hours  Patent Self Care Activities:  . Unable to independently take medications as prescribed . Unable to self administer medications as prescribed  Initial goal documentation     . "To keep mother's RA well managed"       Daughter stated Current Barriers:  Jordan [name] Knowledge Deficits related to disease process and Self Health management of RA . Chronic Disease Management support and education needs related to HTN, RA, Vitamin D deficiency, Acquired Hypothyroidism , Memory Loss   Nurse Case Manager Clinical Goal(s):  Jordan [name] Over the next 90 days,  patient will work with the CCM team to address needs related to disease education and support for treatment management of Rheumatoid Arthritis   CCM RN CM Interventions:  08/05/19 call completed with daughter [name] . Evaluation of current treatment plan related to Rheumatoid Arthritis and  patient's adherence to plan as established by provider. . Reviewed medications with patient and discussed indication, dosage and frequency of patient's prescribed RA treatment; determined patient's son is injecting her Enbrel without issues or concerns . Discussed plans with patient for ongoing care management follow up and provided patient with direct contact information for care management team . Determined Dr. Corliss Skains is managing patient's RA and currently her symptoms are stable . Discussed next f/u with Dr. Corliss Skains is scheduled for 08/15/19 @10 :20 AM; discussed daughter was planning to attend but is expecting inclement weather in her home state; discussed she will contact the office to arrange a coordinated virtual visit with her brother and or she will reschedule  Patient Self Care Activities:  . Unable to independently take medications as prescribed . Unable to self administer medications as prescribed  Initial goal documentation     . COMPLETED: Assist with Chronic Care Management and Care Coordination needs       Current Barriers:  Knowledge Barriers related to resources and support available to address needs related to Chronic Care Management, Medication management and Care Coordination   Case Manager Clinical Goal(s):  Jordan [name] Over the next 30 days, patient will work with the CCM team to address needs related to Chronic Care Management and assistance with medication management.   Interventions:  . Collaborated with BSW and initiated plan of care to address needs related to Chronic Care Management and assistance with medication management  Patient Self Care Activities:  . Self administers medications as prescribed . Attends all scheduled provider appointments . Calls pharmacy for medication refills . Calls provider office for new concerns or questions  Initial goal documentation     . I would like to manage my mother's blood pressure       Current Barriers:  Jordan [name] Knowledge  Deficits related to disease process and Self Health management of HTN . Chronic Disease Management support and education needs related to Hypertension, Rheumatoid Arthritis, Vitamin D deficiency, Acquired Hypothyroidism, Memory Loss  . Knowledge Deficits related to disease state management of blood pressure and Non Adherence to prescribed medication regimen . Latest blood pressure reading 126/80 at last PCP visit (was 162/100 at previous OV) . Scr 0.71 to 1.49 . Current anti-hypertensives: lisinopril . Per PCP notes in 05/2019: o This is a chronic problem. The current episode started more than 1 year ago. The problem is unchanged. The problem is uncontrolled. Pertinent negatives include no anxiety or headaches. Agents associated with hypertension include thyroid hormones. Risk factors for coronary artery disease include sedentary lifestyle. Past treatments include ACE inhibitors. There are no compliance problems.  There is no history of angina. Identifiable causes of hypertension include a thyroid problem.   Pharmacist Clinical Goal(s):  06/2019 Over the next 90 days, patient will work with CCM team and PCP to address needs related to optimized medication management of blood pressure  Interventions: . Comprehensive medication review performed. . Pill packs to aid in compliance.  Daughter to come in to town mid-December to check pill packaging compliance . Patient is not taking lisinopril as prescribed. She is unable to check BP independently, however has a caregiver that can assist with    . Follow Scr 0.71 to  1.49.  Discussed with PCP as patient is on ACEi  CCM RN CM Interventions:  08/05/19 call completed with daughter [name] . Evaluation of current treatment plan related to Hypertension and patient's adherence to plan as established by provider. . Provided education to patient re: target BP for patient is 130/80 or less than; discussed importance of medication adherence and sodium  restriction . Reviewed medications with patient and discussed patient adherence has improved since switching to the pill package system; patient's medication administration is supervised by CNA and son . Discussed plans with patient for ongoing care management follow up and provided patient with direct contact information for care management team . Advised patient, providing education and rationale, to monitor blood pressure daily and record, calling the CCM team and or PCP for findings outside established parameters.  . Provided patient with printed educational materials related to What is High Blood Pressure; Why Should I restrict Sodium; African Americans and High Blood Pressure; Life's Simple 7   Patient Self Care Activities:  . Unable to independently take medications and blood pressure readings as prescribed . Unable to self administer medications as prescribed  Initial goal documentation        The patient verbalized understanding of instructions provided today and declined a print copy of patient instruction materials.   Telephone follow up appointment with care management team member scheduled for: 09/03/19  Delsa Sale, RN, BSN, CCM Care Management Coordinator [facility] Care Management/[facility]  Direct Phone: [phone]

## 2019-08-06 NOTE — Chronic Care Management (AMB) (Signed)
Chronic Care Management   Initial Visit Note  08/05/2019 Name: Jordan [name] MRN: [id number] DOB: 03/01/1934  Referred by: Jordan Brine, FNP Reason for referral : Chronic Care Management (INITIAL CCM RN CM Telephone Outreach )   Jordan [name] is a 86 y.o. year old female who is a primary care patient of Jordan [name], [place]. The CCM team was consulted for assistance with chronic disease management and care coordination needs related to HTN and RA; Acquired Hypothroidism; Vitamin D deficiency; Memory loss   Review of patient status, including review of consultants reports, relevant laboratory and other test results, and collaboration with appropriate care team members and the patient's provider was performed as part of comprehensive patient evaluation and provision of chronic care management services.    SDOH (Social Determinants of Health) screening performed today: needs increased PCA hours See Care Plan for related entries.   Placed initial outbound call to daughter Jordan [name] for a CCM RN CM follow up and a care plan was established.    Medications: Outpatient Encounter Medications as of 08/02/2019  Medication Sig  . albuterol (VENTOLIN HFA) 108 (90 Base) MCG/ACT inhaler Inhale 1 puff into the lungs every 6 (six) hours as needed for wheezing or shortness of breath.  [name] atropine 1 % ophthalmic solution INSTILL 1 GTT INTO OS D  . bacitracin ointment Apply to affected area daily (Patient not taking: Reported on 06/24/2019)  . brimonidine-timolol (COMBIGAN) 0.2-0.5 % ophthalmic solution Place 1 drop into both eyes every 12 (twelve) hours.  [name] etanercept (ENBREL SURECLICK) 50 MG/ML injection Inject 0.98 mLs (50 mg total) into the skin once a week.  . fluticasone (FLONASE) 50 MCG/ACT nasal spray INSTILL 2 SPRAYS IN EACH NOSTRIL EVERY DAY  . folic acid (FOLVITE) 1 MG tablet TAKE 1 TABLET(1 MG) BY MOUTH DAILY  . HYDROcodone-acetaminophen (NORCO) 10-325 MG tablet Take 1 tablet by mouth  every 6 (six) hours as needed.  . latanoprost (XALATAN) 0.005 % ophthalmic solution Place 1 drop into both eyes nightly.  . levothyroxine (SYNTHROID) 100 MCG tablet Take 1 tablet (100 mcg total) by mouth daily.  [name] lisinopril (ZESTRIL) 30 MG tablet Take 1 tablet (30 mg total) by mouth daily.  [name] loratadine (CLARITIN) 10 MG tablet Take 1 tablet (10 mg total) by mouth daily.  . mirtazapine (REMERON) 15 MG tablet Take 1 tablet (15 mg total) by mouth at bedtime.  [name] NAMZARIC 28-10 MG CP24 Take 1 capsule by mouth daily.  [name] omeprazole (PRILOSEC) 20 MG capsule TAKE 1 CAPSULE BY MOUTH EVERY DAY  . simvastatin (ZOCOR) 40 MG tablet Take 1 tablet (40 mg total) by mouth at bedtime.   No facility-administered encounter medications on file as of 08/02/2019.     Objective:  No results found for: HGBA1C Lab Results  Component Value Date   MICROALBUR 30 02/21/2019   CREATININE 1.49 (H) 06/14/2019   BP Readings from Last 3 Encounters:  06/24/19 126/80  03/14/19 (!) 164/102  03/14/19 (!) 202/113    Goals Addressed    . "I am not sure what Vitamin D dosage mother should be taking"       Daughter stated Current Barriers:  [name] Knowledge Deficits related to evaluation and treatment of Vitamin D deficiency . Chronic Disease Management support and education needs related to HTN, RN, Acquired Hypothyroidism, Vitamin D deficiency, Memory loss   Nurse Case Manager Clinical Goal(s):  [name] Over the next 90 days, patient will verbalize understanding of plan for evaluation and treatment of Vitamin D  deficiency  CCM RN CM Interventions:  08/05/19 call completed with patient's daughter Jordan [name]  . Evaluation of current treatment plan related to Vitamin D deficiency and patient's adherence to plan as established by provider. . Provided education to patient re: the importance of raising the Vitamin D level to help with immune and bone support  . Reviewed medications with patient and discussed patient is not currently  prescribed Vitamin D but has been deficient in the past; discussed f/u with PCP to ask for recommendations . Collaborated with PCP Arnette Felts, FNP regarding recommendations for Vitamin D due to deficiency and no recent lab value . Discussed plans with patient for ongoing care management follow up and provided patient with direct contact information for care management team  Patient Self Care Activities:  . Unable to independently take medications as prescribed . Unable to self administer medications as prescribed  Initial goal documentation     . "I need help getting an upright walker for mother'       Daughter stated Current Barriers:  [name] Knowledge Deficits related to how to obtain DME, specifically an upright walker . Chronic Disease Management support and education needs related to HTN, RA, Vitamin D deficiency, Acquired Hypothyroidism, Memory loss   Nurse Case Manager Clinical Goal(s):  [name] Over the next 90 days, patient will work with the CCM RN and PCP to address needs related to DME and or other home safety equipment  CCM RN CM Interventions:  08/05/19 call completed with patient's daughter Jordan [name] . Evaluation of current treatment plan related to DME and home safety and patient's adherence to plan as established by provider. [name] with PCP Arnette Felts, FNP regarding Rx needed for an upright walker; instructed Jordan [name] to send the Rx to [facility] and provided the following fax # [phone] . Discussed plans with patient for ongoing care management follow up and provided patient with direct contact information for care management team . Assessed for falls and or near falls; assessed for fall risk and or other home safety concerns . Determined patient has experienced no recent falls; discussed daughter does feel that she needs additional PCA hours, currently only receiving 3-3.5 hours on weekdays and family feels her dementia has worsened . Sent in basket message to embedded  BSW Bevelyn Ngo requesting she reach out to [name] to offer assistance with how to navigate and initiate the request for additional PCA hours  Patent Self Care Activities:  . Unable to independently take medications as prescribed . Unable to self administer medications as prescribed  Initial goal documentation     . "To keep mother's RA well managed"       Daughter stated Current Barriers:  [name] Knowledge Deficits related to disease process and Self Health management of RA . Chronic Disease Management support and education needs related to HTN, RA, Vitamin D deficiency, Acquired Hypothyroidism , Memory Loss   Nurse Case Manager Clinical Goal(s):  [name] Over the next 90 days, patient will work with the CCM team to address needs related to disease education and support for treatment management of Rheumatoid Arthritis   CCM RN CM Interventions:  08/05/19 call completed with daughter Jordan [name] . Evaluation of current treatment plan related to Rheumatoid Arthritis and patient's adherence to plan as established by provider. . Reviewed medications with patient and discussed indication, dosage and frequency of patient's prescribed RA treatment; determined patient's son is injecting her Enbrel without issues or concerns . Discussed plans with patient for ongoing care  management follow up and provided patient with direct contact information for care management team . Determined Dr. Corliss Skains is managing patient's RA and currently her symptoms are stable . Discussed next f/u with Dr. Corliss Skains is scheduled for 08/15/19 @10 :20 AM; discussed daughter was planning to attend but is expecting inclement weather in her home state; discussed she will contact the office to arrange a coordinated virtual visit with her brother and or she will reschedule  Patient Self Care Activities:  . Unable to independently take medications as prescribed . Unable to self administer medications as prescribed  Initial goal  documentation     . COMPLETED: Assist with Chronic Care Management and Care Coordination needs       Current Barriers:  Knowledge Barriers related to resources and support available to address needs related to Chronic Care Management, Medication management and Care Coordination   Case Manager Clinical Goal(s):  [name] Over the next 30 days, patient will work with the CCM team to address needs related to Chronic Care Management and assistance with medication management.   Interventions:  . Collaborated with BSW and initiated plan of care to address needs related to Chronic Care Management and assistance with medication management  Patient Self Care Activities:  . Self administers medications as prescribed . Attends all scheduled provider appointments . Calls pharmacy for medication refills . Calls provider office for new concerns or questions  Initial goal documentation     . I would like to manage my mother's blood pressure       Current Barriers:  [name] Knowledge Deficits related to disease process and Self Health management of HTN . Chronic Disease Management support and education needs related to Hypertension, Rheumatoid Arthritis, Vitamin D deficiency, Acquired Hypothyroidism, Memory Loss  . Knowledge Deficits related to disease state management of blood pressure and Non Adherence to prescribed medication regimen . Latest blood pressure reading 126/80 at last PCP visit (was 162/100 at previous OV) . Scr 0.71 to 1.49 . Current anti-hypertensives: lisinopril . Per PCP notes in 05/2019: o This is a chronic problem. The current episode started more than 1 year ago. The problem is unchanged. The problem is uncontrolled. Pertinent negatives include no anxiety or headaches. Agents associated with hypertension include thyroid hormones. Risk factors for coronary artery disease include sedentary lifestyle. Past treatments include ACE inhibitors. There are no compliance problems.  There is no  history of angina. Identifiable causes of hypertension include a thyroid problem.   Pharmacist Clinical Goal(s):  06/2019 Over the next 90 days, patient will work with CCM team and PCP to address needs related to optimized medication management of blood pressure  Interventions: . Comprehensive medication review performed. . Pill packs to aid in compliance.  Daughter to come in to town mid-December to check pill packaging compliance . Patient is not taking lisinopril as prescribed. She is unable to check BP independently, however has a caregiver that can assist with    . Follow Scr 0.71 to 1.49.  Discussed with PCP as patient is on ACEi  CCM RN CM Interventions:  08/05/19 call completed with daughter 10/03/19 . Evaluation of current treatment plan related to Hypertension and patient's adherence to plan as established by provider. . Provided education to patient re: target BP for patient is 130/80 or less than; discussed importance of medication adherence and sodium restriction . Reviewed medications with patient and discussed patient adherence has improved since switching to the pill package system; patient's medication administration is supervised by CNA and  son . Discussed plans with patient for ongoing care management follow up and provided patient with direct contact information for care management team . Advised patient, providing education and rationale, to monitor blood pressure daily and record, calling the CCM team and or PCP for findings outside established parameters.  . Provided patient with printed educational materials related to What is High Blood Pressure; Why Should I restrict Sodium; African Americans and High Blood Pressure; Life's Simple 7   Patient Self Care Activities:  . Unable to independently take medications and blood pressure readings as prescribed . Unable to self administer medications as prescribed  Initial goal documentation       Plan:   Telephone follow up  appointment with care management team member scheduled for: 09/03/19  Delsa Sale, RN, BSN, CCM Care Management Coordinator [facility] Care Management/[facility]  Direct Phone: [phone]

## 2019-08-06 NOTE — Chronic Care Management (AMB) (Signed)
Chronic Care Management    Social Work Follow Up Note  08/06/2019 Name: Jordan [name] MRN: [id number] DOB: March 05, 1934  Jordan [name] is a 86 y.o. year old female who is a primary care patient of Jordan Felts, FNP. The CCM team was consulted for assistance with care coordination.   Review of patient status, including review of consultants reports, other relevant assessments, and collaboration with appropriate care team members and the patient's provider was performed as part of comprehensive patient evaluation and provision of chronic care management services.    SW placed a successful outbound call to the patients daughter Jordan [name]. HIPAA identifiers confirmed.   Outpatient Encounter Medications as of 08/06/2019  Medication Sig  . albuterol (VENTOLIN HFA) 108 (90 Base) MCG/ACT inhaler Inhale 1 puff into the lungs every 6 (six) hours as needed for wheezing or shortness of breath.  [name] atropine 1 % ophthalmic solution INSTILL 1 GTT INTO OS D  . bacitracin ointment Apply to affected area daily (Patient not taking: Reported on 06/24/2019)  . brimonidine-timolol (COMBIGAN) 0.2-0.5 % ophthalmic solution Place 1 drop into both eyes every 12 (twelve) hours.  [name] etanercept (ENBREL SURECLICK) 50 MG/ML injection Inject 0.98 mLs (50 mg total) into the skin once a week.  . fluticasone (FLONASE) 50 MCG/ACT nasal spray INSTILL 2 SPRAYS IN EACH NOSTRIL EVERY DAY  . folic acid (FOLVITE) 1 MG tablet TAKE 1 TABLET(1 MG) BY MOUTH DAILY  . HYDROcodone-acetaminophen (NORCO) 10-325 MG tablet Take 1 tablet by mouth every 6 (six) hours as needed.  . latanoprost (XALATAN) 0.005 % ophthalmic solution Place 1 drop into both eyes nightly.  . levothyroxine (SYNTHROID) 100 MCG tablet Take 1 tablet (100 mcg total) by mouth daily.  [name] lisinopril (ZESTRIL) 30 MG tablet Take 1 tablet (30 mg total) by mouth daily.  [name] loratadine (CLARITIN) 10 MG tablet Take 1 tablet (10 mg total) by mouth daily.  . mirtazapine (REMERON)  15 MG tablet Take 1 tablet (15 mg total) by mouth at bedtime.  [name] NAMZARIC 28-10 MG CP24 Take 1 capsule by mouth daily.  [name] omeprazole (PRILOSEC) 20 MG capsule TAKE 1 CAPSULE BY MOUTH EVERY DAY  . simvastatin (ZOCOR) 40 MG tablet Take 1 tablet (40 mg total) by mouth at bedtime.  [name] SYNTHROID 100 MCG tablet Take 1 tablet (100 mcg total) by mouth daily before breakfast.   No facility-administered encounter medications on file as of 08/06/2019.     Goals Addressed            This Visit's Progress   . Assist with care coordination needs related to patients PCA hours covered under Medicaid benefit       Current Barriers:  [name] Memory loss contributing to ongoing depression Dx . Limited ability to perform ADL and iADL's in the home . Limited local family support . Declining functional status due to vision impairment  Social Work Clinical Goal(s):  [name] Over the next 45 days the patient and her daughter, Jordan [name], will work with SW to address concerns related to increasing PCA hours  CCM SW Interventions: Completed 08/06/19 . Communication received from RN Case Manager requesting SW involvement to assist with increasing PCA hours . Successful outbound call placed to Jordan [name], the patients daughter who assists with ongoing care coordination needs . Reviewed [name] concern over the patient answering assessment questions incorrectly due to memory impairment which caused the patient to experience a reduction in PCA hours . Discussed patients current PCA approval hours t obe 3  hours per day 6 days a week and 1 day with 4 hours. The patient receives care from same aide "Jordan [name]" who the patient is very fond of . Educated Jordan [name] on the opportunity to qualify for more hours due to patients on-going cognitive decline due to memory loss and depression o Patients physical functioning level has also changed due to the patients ongoing vision impairment which is now causing difficulty with  accessing the shower . Advised [name], [place] could assist with a change of status application to arrange an assessment with Jordan [name] o [name] reports she feels it is time for yearly re-certification . Unsuccessful outbound call placed to Jordan [name] to determine if re-certification has been scheduled . Plan to submit a change of status application pending outcome of [place] care call  Patient Self Care Activities:  . Patient with memory impairment, daughter states understanding of plan  . Unable to perform ADLs independently . Unable to perform IADLs independently .    Initial goal documentation         Follow Up Plan: SW to follow up with [name] over the next week upon hearing back from Jordan [name].   Jordan [name], BSW, CDP Social Worker, Certified Dementia Practitioner [facility] [phone]  Total time spent performing care coordination and/or care management activities with the patient by phone or face to face = 15 minutes.

## 2019-08-07 ENCOUNTER — Ambulatory Visit: Payer: Self-pay

## 2019-08-07 DIAGNOSIS — F32A Depression, unspecified: Secondary | ICD-10-CM

## 2019-08-07 DIAGNOSIS — R413 Other amnesia: Secondary | ICD-10-CM

## 2019-08-07 DIAGNOSIS — F329 Major depressive disorder, single episode, unspecified: Secondary | ICD-10-CM

## 2019-08-07 NOTE — Patient Instructions (Signed)
Social Worker Visit Information  Goals we discussed today:  Goals Addressed            This Visit's Progress   . Assist with care coordination needs related to patients PCA hours covered under Medicaid benefit       Current Barriers:  [name] Memory loss contributing to ongoing depression Dx . Limited ability to perform ADL and iADL's in the home . Limited local family support . Declining functional status due to vision impairment  Social Work Clinical Goal(s):  [name] Over the next 45 days the patient and her daughter, Jordan [name], will work with SW to address concerns related to increasing PCA hours  CCM SW Interventions: Completed 08/07/19 . Successful outbound call to [place] with [employer] who reports the patient is not due for re-certification until June/July of 2021 o Advised by [facility] to submit "change of status" request indicating patients decline . Collaboration with the patients primary provider to inquire further on patients memory status as there is not a current formal diagnosis o Noted patient may receive more PCA hours with Dementia/Alzheimer's diagnosis . Plan to complete change of status form upon receiving communication from Arnette Felts FNP regarding cognition  Patient Self Care Activities:  . Patient with memory impairment, daughter states understanding of plan  . Unable to perform ADLs independently . Unable to perform IADLs independently .    Please see past updates related to this goal by clicking on the "Past Updates" button in the selected goal           Follow Up Plan: SW to continue to follow and assist with PCA application   Jordan [name], BSW, CDP Social Worker, Certified Dementia Practitioner [facility] Care Management [phone]

## 2019-08-07 NOTE — Chronic Care Management (AMB) (Signed)
Chronic Care Management    Social Work Follow Up Note  08/07/2019 Name: Jordan [name] MRN: [id number] DOB: 1933/10/03  Jordan [name] is a 86 y.o. year old female who is a primary care patient of Jordan [name], [place]. The CCM team was consulted for assistance with care coordination.   Review of patient status, including review of consultants reports, other relevant assessments, and collaboration with appropriate care team members and the patient's provider was performed as part of comprehensive patient evaluation and provision of chronic care management services.     Outpatient Encounter Medications as of 08/07/2019  Medication Sig  . albuterol (VENTOLIN HFA) 108 (90 Base) MCG/ACT inhaler Inhale 1 puff into the lungs every 6 (six) hours as needed for wheezing or shortness of breath.  [name] atropine 1 % ophthalmic solution INSTILL 1 GTT INTO OS D  . bacitracin ointment Apply to affected area daily (Patient not taking: Reported on 06/24/2019)  . brimonidine-timolol (COMBIGAN) 0.2-0.5 % ophthalmic solution Place 1 drop into both eyes every 12 (twelve) hours.  [name] etanercept (ENBREL SURECLICK) 50 MG/ML injection Inject 0.98 mLs (50 mg total) into the skin once a week.  . fluticasone (FLONASE) 50 MCG/ACT nasal spray INSTILL 2 SPRAYS IN EACH NOSTRIL EVERY DAY  . folic acid (FOLVITE) 1 MG tablet TAKE 1 TABLET(1 MG) BY MOUTH DAILY  . HYDROcodone-acetaminophen (NORCO) 10-325 MG tablet Take 1 tablet by mouth every 6 (six) hours as needed.  . latanoprost (XALATAN) 0.005 % ophthalmic solution Place 1 drop into both eyes nightly.  . levothyroxine (SYNTHROID) 100 MCG tablet Take 1 tablet (100 mcg total) by mouth daily.  [name] lisinopril (ZESTRIL) 30 MG tablet Take 1 tablet (30 mg total) by mouth daily.  [name] loratadine (CLARITIN) 10 MG tablet Take 1 tablet (10 mg total) by mouth daily.  . mirtazapine (REMERON) 15 MG tablet Take 1 tablet (15 mg total) by mouth at bedtime.  [name] NAMZARIC 28-10 MG CP24 Take 1 capsule by  mouth daily.  [name] omeprazole (PRILOSEC) 20 MG capsule TAKE 1 CAPSULE BY MOUTH EVERY DAY  . simvastatin (ZOCOR) 40 MG tablet Take 1 tablet (40 mg total) by mouth at bedtime.  [name] SYNTHROID 100 MCG tablet Take 1 tablet (100 mcg total) by mouth daily before breakfast.   No facility-administered encounter medications on file as of 08/07/2019.     Goals Addressed            This Visit's Progress   . Assist with care coordination needs related to patients PCA hours covered under Medicaid benefit       Current Barriers:  [name] Memory loss contributing to ongoing depression Dx . Limited ability to perform ADL and iADL's in the home . Limited local family support . Declining functional status due to vision impairment  Social Work Clinical Goal(s):  [name] Over the next 45 days the patient and her daughter, Jordan [name], will work with SW to address concerns related to increasing PCA hours  CCM SW Interventions: Completed 08/07/19 . Successful outbound call to [facility] with Jordan [name] who reports the patient is not due for re-certification until June/July of 2021 o Advised by [facility] to submit "change of status" request indicating patients decline . Collaboration with the patients primary provider to inquire further on patients memory status as there is not a current formal diagnosis o Noted patient may receive more PCA hours with Dementia/Alzheimer's diagnosis . Plan to complete change of status form upon receiving communication from Jordan Brine FNP regarding cognition  Patient  Self Care Activities:  . Patient with memory impairment, daughter states understanding of plan  . Unable to perform ADLs independently . Unable to perform IADLs independently .    Please see past updates related to this goal by clicking on the "Past Updates" button in the selected goal          Follow Up Plan: SW will assist with PCA application over the next 2 weeks.   Jordan [name], BSW, CDP Social Worker,  Certified Dementia Practitioner [facility] Care Management [phone]  Total time spent performing care coordination and/or care management activities with the patient by phone or face to face = 6 minutes.

## 2019-08-08 ENCOUNTER — Other Ambulatory Visit: Payer: Self-pay | Admitting: Nurse Practitioner

## 2019-08-08 ENCOUNTER — Telehealth: Payer: Self-pay

## 2019-08-08 NOTE — Telephone Encounter (Signed)
Patient's daughter [name] called stating her blood pressure has been running really low 75/45 last night, 68/38 this morning and 84/51 this afternooon at 3:25pm. They want to know what can be done.   I RETURNED HER CALL AND ADVISED HER SHE NEEDS TO STOP GIVING THE PT HER BP MED AND TAKE HER TO THE ER TO BE EVALUATED SHE STATED THE PT WASN'T TOO ALERT LAST NIGHT BUT SHE IS BETTER NOW AFTER THEY GAVE HER SOME COFFEE,GRAPE JUICE AND WARM WATER. I TOLD HER WE RECOMMENDED THAT SHE STILL GO TO THE ER TO BE SEEN TO MAKE SURE SHE DOES NOT HAVE AN INFECTION. PATIENT'S DAUGHTER STATED THE PT DECLINED TO GO TO THE ER LAST NIGHT AND THEY REALLY DONT WANT TO BRING HER THERE. Adolph Pollack

## 2019-08-09 ENCOUNTER — Ambulatory Visit: Payer: Self-pay

## 2019-08-09 DIAGNOSIS — R413 Other amnesia: Secondary | ICD-10-CM

## 2019-08-09 DIAGNOSIS — F32A Depression, unspecified: Secondary | ICD-10-CM

## 2019-08-09 DIAGNOSIS — F329 Major depressive disorder, single episode, unspecified: Secondary | ICD-10-CM

## 2019-08-09 NOTE — Patient Instructions (Signed)
Social Worker Visit Information  Goals we discussed today:  Goals Addressed            This Visit's Progress   . Assist with care coordination needs related to patients PCA hours covered under Medicaid benefit       Current Barriers:  [name] Memory loss contributing to ongoing depression Dx . Limited ability to perform ADL and iADL's in the home . Limited local family support . Declining functional status due to vision impairment  Social Work Clinical Goal(s):  [name] Over the next 45 days the patient and her daughter, Jordan [name], will work with SW to address concerns related to increasing PCA hours  CCM SW Interventions: Completed 08/09/19 . Outbound call placed to the patients daughter Jordan [name] to update on goal progression o Advised [name], [place] is awaiting response from patients provider regarding dementia diagnosis prior to completing change of status application o [name] reports patient originally diagnosed in [place] approximately 5 years ago but the records were transferred to patients current physician office . SW to continue to follow  Patient Self Care Activities:  . Patient with memory impairment, daughter states understanding of plan  . Unable to perform ADLs independently . Unable to perform IADLs independently .    Please see past updates related to this goal by clicking on the "Past Updates" button in the selected goal          Materials Provided: Verbal education surrounding PCS hours based on patient diagnoses  Follow Up Plan: SW will follow up with patient by phone over the next week.   Jordan [name], BSW, CDP Social Worker, Certified Dementia Practitioner [facility] Care Management [phone]

## 2019-08-09 NOTE — Chronic Care Management (AMB) (Signed)
Chronic Care Management    Social Work Follow Up Note  08/09/2019 Name: Jordan [name] MRN: [id number] DOB: [birth year]  Jordan [name] is a 86 y.o. year old female who is a primary care patient of Jordan [name], Jordan [name]. The CCM team was consulted for assistance with care coordination.   Review of patient status, including review of consultants reports, other relevant assessments, and collaboration with appropriate care team members and the patient's provider was performed as part of comprehensive patient evaluation and provision of chronic care management services.    SW placed a successful outbound call to the patients daughter and caregiver Jordan [name] to assist with care coordination of PCA hours.  Outpatient Encounter Medications as of 08/09/2019  Medication Sig  . albuterol (VENTOLIN HFA) 108 (90 Base) MCG/ACT inhaler Inhale 1 puff into the lungs every 6 (six) hours as needed for wheezing or shortness of breath.  . ALLERGY RELIEF 10 MG tablet TAKE 1 TABLET BY MOUTH EVERY DAY  . atropine 1 % ophthalmic solution INSTILL 1 GTT INTO OS D  . bacitracin ointment Apply to affected area daily (Patient not taking: Reported on 06/24/2019)  . brimonidine-timolol (COMBIGAN) 0.2-0.5 % ophthalmic solution Place 1 drop into both eyes every 12 (twelve) hours.  [name] etanercept (ENBREL SURECLICK) 50 MG/ML injection Inject 0.98 mLs (50 mg total) into the skin once a week.  . fluticasone (FLONASE) 50 MCG/ACT nasal spray INSTILL 2 SPRAYS IN EACH NOSTRIL EVERY DAY  . folic acid (FOLVITE) 1 MG tablet TAKE 1 TABLET(1 MG) BY MOUTH DAILY  . HYDROcodone-acetaminophen (NORCO) 10-325 MG tablet Take 1 tablet by mouth every 6 (six) hours as needed.  . latanoprost (XALATAN) 0.005 % ophthalmic solution Place 1 drop into both eyes nightly.  . levothyroxine (SYNTHROID) 100 MCG tablet Take 1 tablet (100 mcg total) by mouth daily.  [name] lisinopril (ZESTRIL) 30 MG tablet Take 1 tablet (30 mg total) by mouth daily.  .  mirtazapine (REMERON) 15 MG tablet Take 1 tablet (15 mg total) by mouth at bedtime.  [name] NAMZARIC 28-10 MG CP24 TAKE 1 CAPSULE BY MOUTH EVERY DAY  . omeprazole (PRILOSEC) 20 MG capsule TAKE 1 CAPSULE BY MOUTH EVERY DAY  . simvastatin (ZOCOR) 40 MG tablet Take 1 tablet (40 mg total) by mouth at bedtime.  [name] SYNTHROID 100 MCG tablet Take 1 tablet (100 mcg total) by mouth daily before breakfast.   No facility-administered encounter medications on file as of 08/09/2019.     Goals Addressed            This Visit's Progress   . Assist with care coordination needs related to patients PCA hours covered under Medicaid benefit       Current Barriers:  [name] Memory loss contributing to ongoing depression Dx . Limited ability to perform ADL and iADL's in the home . Limited local family support . Declining functional status due to vision impairment  Social Work Clinical Goal(s):  [name] Over the next 45 days the patient and her daughter, Jordan [name], will work with SW to address concerns related to increasing PCA hours  CCM SW Interventions: Completed 08/09/19 . Outbound call placed to the patients daughter Jordan [name] to update on goal progression o Advised Jordan [name], [place] is awaiting response from patients provider regarding dementia diagnosis prior to completing change of status application o Jordan [name] reports patient originally diagnosed in [place] approximately 5 years ago but the records were transferred to patients current physician office . SW to continue to follow  Patient Self Care Activities:  . Patient with memory impairment, daughter states understanding of plan  . Unable to perform ADLs independently . Unable to perform IADLs independently .    Please see past updates related to this goal by clicking on the "Past Updates" button in the selected goal          Follow Up Plan: SW will follow up with patient by phone over the next week   Jordan [name], BSW, CDP Social Worker,  Certified Dementia Practitioner [facility] Care Management [phone]  Total time spent performing care coordination and/or care management activities with the patient by phone or face to face = 19 minutes.

## 2019-08-13 ENCOUNTER — Ambulatory Visit: Payer: Self-pay

## 2019-08-13 ENCOUNTER — Encounter: Payer: Self-pay | Admitting: Nurse Practitioner

## 2019-08-13 DIAGNOSIS — I10 Essential (primary) hypertension: Secondary | ICD-10-CM

## 2019-08-13 DIAGNOSIS — M0579 Rheumatoid arthritis with rheumatoid factor of multiple sites without organ or systems involvement: Secondary | ICD-10-CM

## 2019-08-13 DIAGNOSIS — F329 Major depressive disorder, single episode, unspecified: Secondary | ICD-10-CM | POA: Diagnosis not present

## 2019-08-13 DIAGNOSIS — G309 Alzheimer's disease, unspecified: Secondary | ICD-10-CM | POA: Diagnosis not present

## 2019-08-13 DIAGNOSIS — F0280 Dementia in other diseases classified elsewhere without behavioral disturbance: Secondary | ICD-10-CM

## 2019-08-13 DIAGNOSIS — F32A Depression, unspecified: Secondary | ICD-10-CM

## 2019-08-13 NOTE — Patient Instructions (Signed)
Social Worker Visit Information  Goals we discussed today:  Goals Addressed            This Visit's Progress   . Assist with care coordination needs related to patients PCA hours covered under Medicaid benefit   On track    Current Barriers:  [name] Memory loss contributing to ongoing depression Dx . Limited ability to perform ADL and iADL's in the home . Limited local family support . Declining functional status due to vision impairment  Social Work Clinical Goal(s):  [name] Over the next 45 days the patient and her daughter, [name], will work with SW to address concerns related to increasing PCA hours  CCM SW Interventions: Completed 08/13/19 . Inbound call received from [name], the patients daughter reporting [facility] has record of patients Alzheimer's diagnosis from original PCS approval . Advised Mrs Foster Simpson would collaborate with the patients primary care provider regarding diagnosis and complete change of status request form to increase PCS hours . Collaboration with Mrs. Arnette Felts FNP who has confirmed diagnosis of Alzheimer's Dementia . Initiated PCS change of status application - sent to provider for signature . SW to continue to follow  Patient Self Care Activities:  . Patient with memory impairment, daughter states understanding of plan  . Unable to perform ADLs independently . Unable to perform IADLs independently .    Please see past updates related to this goal by clicking on the "Past Updates" button in the selected goal          Follow Up Plan: SW will follow up with the patient over the next 14 days   Bevelyn Ngo, BSW, CDP Social Worker, Certified Dementia Practitioner [facility] Care Management [phone]

## 2019-08-13 NOTE — Chronic Care Management (AMB) (Signed)
Chronic Care Management    Social Work Follow Up Note  08/13/2019 Name: Keyairra Kolinski MRN: [id number] DOB: 07-10-1934  Zamyiah Tino is a 86 y.o. year old female who is a primary care patient of [name], [place]. The CCM team was consulted for assistance with care coordination.   Review of patient status, including review of consultants reports, other relevant assessments, and collaboration with appropriate care team members and the patient's provider was performed as part of comprehensive patient evaluation and provision of chronic care management services.    SW assisted with ongoing goal to obtain increased PCS hours through [employer].  Outpatient Encounter Medications as of 08/13/2019  Medication Sig  . albuterol (VENTOLIN HFA) 108 (90 Base) MCG/ACT inhaler Inhale 1 puff into the lungs every 6 (six) hours as needed for wheezing or shortness of breath.  . ALLERGY RELIEF 10 MG tablet TAKE 1 TABLET BY MOUTH EVERY DAY  . atropine 1 % ophthalmic solution INSTILL 1 GTT INTO OS D  . bacitracin ointment Apply to affected area daily (Patient not taking: Reported on 06/24/2019)  . brimonidine-timolol (COMBIGAN) 0.2-0.5 % ophthalmic solution Place 1 drop into both eyes every 12 (twelve) hours.  [name] etanercept (ENBREL SURECLICK) 50 MG/ML injection Inject 0.98 mLs (50 mg total) into the skin once a week.  . fluticasone (FLONASE) 50 MCG/ACT nasal spray INSTILL 2 SPRAYS IN EACH NOSTRIL EVERY DAY  . folic acid (FOLVITE) 1 MG tablet TAKE 1 TABLET(1 MG) BY MOUTH DAILY  . HYDROcodone-acetaminophen (NORCO) 10-325 MG tablet Take 1 tablet by mouth every 6 (six) hours as needed.  . latanoprost (XALATAN) 0.005 % ophthalmic solution Place 1 drop into both eyes nightly.  . levothyroxine (SYNTHROID) 100 MCG tablet Take 1 tablet (100 mcg total) by mouth daily.  [name] lisinopril (ZESTRIL) 30 MG tablet Take 1 tablet (30 mg total) by mouth daily.  . mirtazapine (REMERON) 15 MG tablet Take 1 tablet (15 mg  total) by mouth at bedtime.  [name] NAMZARIC 28-10 MG CP24 TAKE 1 CAPSULE BY MOUTH EVERY DAY  . omeprazole (PRILOSEC) 20 MG capsule TAKE 1 CAPSULE BY MOUTH EVERY DAY  . simvastatin (ZOCOR) 40 MG tablet Take 1 tablet (40 mg total) by mouth at bedtime.  [name] SYNTHROID 100 MCG tablet Take 1 tablet (100 mcg total) by mouth daily before breakfast.   No facility-administered encounter medications on file as of 08/13/2019.     Goals Addressed            This Visit's Progress   . Assist with care coordination needs related to patients PCA hours covered under Medicaid benefit   On track    Current Barriers:  [name] Memory loss contributing to ongoing depression Dx . Limited ability to perform ADL and iADL's in the home . Limited local family support . Declining functional status due to vision impairment  Social Work Clinical Goal(s):  [name] Over the next 45 days the patient and her daughter, [name], will work with SW to address concerns related to increasing PCA hours  CCM SW Interventions: Completed 08/13/19 . Inbound call received from [name], the patients daughter reporting [facility] has record of patients Alzheimer's diagnosis from original PCS approval . Advised Mrs Lu Duffel would collaborate with the patients primary care provider regarding diagnosis and complete change of status request form to increase PCS hours . Collaboration with Mrs. [name] FNP who has confirmed diagnosis of Alzheimer's Dementia . Initiated PCS change of status application - sent to  provider for signature . SW to continue to follow  Patient Self Care Activities:  . Patient with memory impairment, daughter states understanding of plan  . Unable to perform ADLs independently . Unable to perform IADLs independently .    Please see past updates related to this goal by clicking on the "Past Updates" button in the selected goal          Follow Up Plan: SW to follow up with the patient and her  daughter over the next 14 days.  Bevelyn Ngo, BSW, CDP Social Worker, Certified Dementia Practitioner [facility] Care Management [phone]  Total time spent performing care coordination and/or care management activities with the patient by phone or face to face = 23 minutes.

## 2019-08-14 ENCOUNTER — Ambulatory Visit: Payer: Self-pay

## 2019-08-14 DIAGNOSIS — F0280 Dementia in other diseases classified elsewhere without behavioral disturbance: Secondary | ICD-10-CM

## 2019-08-14 NOTE — Chronic Care Management (AMB) (Signed)
  Chronic Care Management   Outreach Note  08/14/2019 Name: Jordan [name] MRN: [id number] DOB: 1934/05/29  Referred by: Jordan Felts, FNP Reason for referral : Care Coordination   Successful outbound call placed to the patients daughter, Jordan [name], to communicate PCS application status. [name] understand once the patients provider complete the application it will be sent to [facility] to initiate an assessment.  Follow Up Plan: SW to follow up with [facility] over the next week to confirm receipt of document.  Jordan [name], BSW, CDP Social Worker, Certified Dementia Practitioner [facility] Care Management [phone]

## 2019-08-15 ENCOUNTER — Ambulatory Visit: Payer: Medicare Other | Admitting: Rheumatology

## 2019-08-21 ENCOUNTER — Ambulatory Visit: Payer: Self-pay

## 2019-08-21 DIAGNOSIS — F0280 Dementia in other diseases classified elsewhere without behavioral disturbance: Secondary | ICD-10-CM

## 2019-08-21 NOTE — Chronic Care Management (AMB) (Signed)
Chronic Care Management    Social Work Follow Up Note  08/21/2019 Name: Jordan [name] MRN: [id number] DOB: 1934-04-03  Jordan [name] is a 86 y.o. year old female who is a primary care patient of Arnette Felts, FNP. The CCM team was consulted for assistance with care coordination.   Review of patient status, including review of consultants reports, other relevant assessments, and collaboration with appropriate care team members and the patient's provider was performed as part of comprehensive patient evaluation and provision of chronic care management services.     Outpatient Encounter Medications as of 08/21/2019  Medication Sig  . albuterol (VENTOLIN HFA) 108 (90 Base) MCG/ACT inhaler Inhale 1 puff into the lungs every 6 (six) hours as needed for wheezing or shortness of breath.  . ALLERGY RELIEF 10 MG tablet TAKE 1 TABLET BY MOUTH EVERY DAY  . atropine 1 % ophthalmic solution INSTILL 1 GTT INTO OS D  . brimonidine-timolol (COMBIGAN) 0.2-0.5 % ophthalmic solution Place 1 drop into both eyes every 12 (twelve) hours.  [name] etanercept (ENBREL SURECLICK) 50 MG/ML injection Inject 0.98 mLs (50 mg total) into the skin once a week.  . fluticasone (FLONASE) 50 MCG/ACT nasal spray INSTILL 2 SPRAYS IN EACH NOSTRIL EVERY DAY  . folic acid (FOLVITE) 1 MG tablet TAKE 1 TABLET(1 MG) BY MOUTH DAILY  . HYDROcodone-acetaminophen (NORCO) 10-325 MG tablet Take 1 tablet by mouth every 6 (six) hours as needed.  . latanoprost (XALATAN) 0.005 % ophthalmic solution Place 1 drop into both eyes nightly.  . levothyroxine (SYNTHROID) 100 MCG tablet Take 1 tablet (100 mcg total) by mouth daily.  [name] lisinopril (ZESTRIL) 30 MG tablet Take 1 tablet (30 mg total) by mouth daily.  . mirtazapine (REMERON) 15 MG tablet Take 1 tablet (15 mg total) by mouth at bedtime.  [name] NAMZARIC 28-10 MG CP24 TAKE 1 CAPSULE BY MOUTH EVERY DAY  . omeprazole (PRILOSEC) 20 MG capsule TAKE 1 CAPSULE BY MOUTH EVERY DAY  . simvastatin (ZOCOR) 40  MG tablet Take 1 tablet (40 mg total) by mouth at bedtime.  [name] SYNTHROID 100 MCG tablet Take 1 tablet (100 mcg total) by mouth daily before breakfast.   No facility-administered encounter medications on file as of 08/21/2019.     Goals Addressed            This Visit's Progress   . Assist with care coordination needs related to patients PCA hours covered under Medicaid benefit       Current Barriers:  [name] Memory loss contributing to ongoing depression Dx . Limited ability to perform ADL and iADL's in the home . Limited local family support . Declining functional status due to vision impairment  Social Work Clinical Goal(s):  [name] Over the next 45 days the patient and her daughter, Jordan [name], will work with SW to address concerns related to increasing PCA hours  CCM SW Interventions: Completed 08/21/19 . Outbound call placed to [facility] to confirm receipt of PCS Change of Status form o Spoke with representative Boneta Lucks who reports no applications have been accepted at this time . Collaboration with the patients primary care provider requesting feedback on status of application  Patient Self Care Activities:  . Patient with memory impairment, daughter states understanding of plan  . Unable to perform ADLs independently . Unable to perform IADLs independently .    Please see past updates related to this goal by clicking on the "Past Updates" button in the selected goal  Follow Up Plan: SW will continue to follow and assist with care coordination needs.   Daneen Schick, BSW, CDP Social Worker, Certified Dementia Practitioner [place] [facility] [place] Management [phone]  Total time spent performing care coordination and/or care management activities with the patient by phone or face to face = 9 minutes.

## 2019-08-30 DIAGNOSIS — Z049 Encounter for examination and observation for unspecified reason: Secondary | ICD-10-CM | POA: Diagnosis not present

## 2019-09-02 ENCOUNTER — Ambulatory Visit: Payer: Self-pay

## 2019-09-02 ENCOUNTER — Telehealth: Payer: Self-pay

## 2019-09-02 DIAGNOSIS — I10 Essential (primary) hypertension: Secondary | ICD-10-CM

## 2019-09-02 DIAGNOSIS — G309 Alzheimer's disease, unspecified: Secondary | ICD-10-CM

## 2019-09-02 DIAGNOSIS — F0280 Dementia in other diseases classified elsewhere without behavioral disturbance: Secondary | ICD-10-CM

## 2019-09-02 NOTE — Chronic Care Management (AMB) (Signed)
  Chronic Care Management   Outreach Note  09/02/2019 Name: Jordan [name] MRN: [id number] DOB: 12-16-1933  Referred by: Arnette Felts, FNP Reason for referral : Care Coordination   SW placed an unsuccessful outbound call to [name] to assist with patient care coordination needs. HIPAA compliant voice message left requesting a return call.  Follow Up Plan: The care management team will reach out to the patient again over the next 14 days.   Bevelyn Ngo, BSW, CDP Social Worker, Certified Dementia Practitioner [facility] Care Management [phone]

## 2019-09-03 ENCOUNTER — Telehealth: Payer: Self-pay

## 2019-09-10 ENCOUNTER — Ambulatory Visit: Payer: Self-pay

## 2019-09-10 DIAGNOSIS — I10 Essential (primary) hypertension: Secondary | ICD-10-CM

## 2019-09-10 DIAGNOSIS — F0280 Dementia in other diseases classified elsewhere without behavioral disturbance: Secondary | ICD-10-CM

## 2019-09-10 NOTE — Chronic Care Management (AMB) (Signed)
Chronic Care Management    Social Work Follow Up Note  09/10/2019 Name: Cortlyn Cannell MRN: [id number] DOB: [birth year]  Mizani Dilday is a 86 y.o. year old female who is a primary care patient of Arnette Felts, FNP. The CCM team was consulted for assistance with care coordination.   Review of patient status, including review of consultants reports, other relevant assessments, and collaboration with appropriate care team members and the patient's provider was performed as part of comprehensive patient evaluation and provision of chronic care management services.    SW placed an outbound call to the patients daughter, [name], to assist with care coordination.  Outpatient Encounter Medications as of 09/10/2019  Medication Sig  . albuterol (VENTOLIN HFA) 108 (90 Base) MCG/ACT inhaler Inhale 1 puff into the lungs every 6 (six) hours as needed for wheezing or shortness of breath.  . ALLERGY RELIEF 10 MG tablet TAKE 1 TABLET BY MOUTH EVERY DAY  . atropine 1 % ophthalmic solution INSTILL 1 GTT INTO OS D  . brimonidine-timolol (COMBIGAN) 0.2-0.5 % ophthalmic solution Place 1 drop into both eyes every 12 (twelve) hours.  [name] etanercept (ENBREL SURECLICK) 50 MG/ML injection Inject 0.98 mLs (50 mg total) into the skin once a week.  . fluticasone (FLONASE) 50 MCG/ACT nasal spray INSTILL 2 SPRAYS IN EACH NOSTRIL EVERY DAY  . folic acid (FOLVITE) 1 MG tablet TAKE 1 TABLET(1 MG) BY MOUTH DAILY  . HYDROcodone-acetaminophen (NORCO) 10-325 MG tablet Take 1 tablet by mouth every 6 (six) hours as needed.  . latanoprost (XALATAN) 0.005 % ophthalmic solution Place 1 drop into both eyes nightly.  . levothyroxine (SYNTHROID) 100 MCG tablet Take 1 tablet (100 mcg total) by mouth daily.  [name] lisinopril (ZESTRIL) 30 MG tablet Take 1 tablet (30 mg total) by mouth daily.  . mirtazapine (REMERON) 15 MG tablet Take 1 tablet (15 mg total) by mouth at bedtime.  [name] NAMZARIC 28-10 MG CP24 TAKE 1 CAPSULE BY MOUTH EVERY  DAY  . omeprazole (PRILOSEC) 20 MG capsule TAKE 1 CAPSULE BY MOUTH EVERY DAY  . simvastatin (ZOCOR) 40 MG tablet Take 1 tablet (40 mg total) by mouth at bedtime.  [name] SYNTHROID 100 MCG tablet Take 1 tablet (100 mcg total) by mouth daily before breakfast.   No facility-administered encounter medications on file as of 09/10/2019.     Goals Addressed            This Visit's Progress   . "I need help getting an upright walker for mother'       Daughter stated Current Barriers:  [name] Knowledge Deficits related to how to obtain DME, specifically an upright walker . Chronic Disease Management support and education needs related to HTN, RA, Vitamin D deficiency, Acquired Hypothyroidism, Memory loss   Nurse Case Manager Clinical Goal(s):  [name] Over the next 90 days, patient will work with the CCM RN and PCP to address needs related to DME and or other home safety equipment  CCM SW Interventions: Completed 09/10/2019 with [name] . Communication received from primary provider requesting confirmation the patient received upright walker . Outbound call placed to the patients daughter and primary point of contact [name] who denies receipt at this time . Collaboration with Primary care provider to provide an update o Requested order date for SW to follow up as needed  Patent Self Care Activities:  . Unable to independently take medications as prescribed . Unable to self administer medications as prescribed  Please see past updates related  to this goal by clicking on the "Past Updates" button in the selected goal          Follow Up Plan: SW will follow up with patient by phone over the next week.  Daneen Schick, BSW, CDP Social Worker, Certified Dementia Practitioner [place] [facility] [place] Management [phone]  Total time spent performing care coordination and/or care management activities with the patient by phone or face to face = 7 minutes.

## 2019-09-10 NOTE — Patient Instructions (Signed)
Social Worker Visit Information  Goals we discussed today:  Goals Addressed            This Visit's Progress   . "I need help getting an upright walker for mother'       Daughter stated Current Barriers:  [name] Knowledge Deficits related to how to obtain DME, specifically an upright walker . Chronic Disease Management support and education needs related to HTN, RA, Vitamin D deficiency, Acquired Hypothyroidism, Memory loss   Nurse Case Manager Clinical Goal(s):  [name] Over the next 90 days, patient will work with the CCM RN and PCP to address needs related to DME and or other home safety equipment  CCM SW Interventions: Completed 09/10/2019 with [name] . Communication received from primary provider requesting confirmation the patient received upright walker . Outbound call placed to the patients daughter and primary point of contact [name] who denies receipt at this time . Collaboration with Primary care provider to provide an update o Requested order date for SW to follow up as needed  CCM RN CM Interventions:  08/05/19 call completed with patient's daughter [name] . Evaluation of current treatment plan related to DME and home safety and patient's adherence to plan as established by provider. [name] with PCP Arnette Felts, FNP regarding Rx needed for an upright walker; instructed [name] to send the Rx to [facility] and provided the following fax # [phone] . Discussed plans with patient for ongoing care management follow up and provided patient with direct contact information for care management team . Assessed for falls and or near falls; assessed for fall risk and or other home safety concerns . Determined patient has experienced no recent falls; discussed daughter does feel that she needs additional PCA hours, currently only receiving 3-3.5 hours on weekdays and family feels her dementia has worsened . Sent in basket message to embedded BSW Bevelyn Ngo requesting  she reach out to Ms. [name] to offer assistance with how to navigate and initiate the request for additional PCA hours  Patent Self Care Activities:  . Unable to independently take medications as prescribed . Unable to self administer medications as prescribed  Please see past updates related to this goal by clicking on the "Past Updates" button in the selected goal           Follow Up Plan: SW will follow up with patient by phone over the next week   Bevelyn Ngo, BSW, CDP Social Worker, Certified Dementia Practitioner [facility] Care Management [phone]

## 2019-09-16 ENCOUNTER — Ambulatory Visit: Payer: Self-pay

## 2019-09-16 DIAGNOSIS — M0579 Rheumatoid arthritis with rheumatoid factor of multiple sites without organ or systems involvement: Secondary | ICD-10-CM

## 2019-09-16 DIAGNOSIS — F0280 Dementia in other diseases classified elsewhere without behavioral disturbance: Secondary | ICD-10-CM

## 2019-09-16 NOTE — Chronic Care Management (AMB) (Signed)
Chronic Care Management    Social Work Follow Up Note  09/16/2019 Name: Jordan [name] MRN: [id number] DOB: Dec 24, 1933  Jordan [name] is a 86 y.o. year old female who is a primary care patient of [name], [place]. The CCM team was consulted for assistance with care coordination.   Review of patient status, including review of consultants reports, other relevant assessments, and collaboration with appropriate care team members and the patient's provider was performed as part of comprehensive patient evaluation and provision of chronic care management services.     Outpatient Encounter Medications as of 09/16/2019  Medication Sig  . albuterol (VENTOLIN HFA) 108 (90 Base) MCG/ACT inhaler Inhale 1 puff into the lungs every 6 (six) hours as needed for wheezing or shortness of breath.  . ALLERGY RELIEF 10 MG tablet TAKE 1 TABLET BY MOUTH EVERY DAY  . atropine 1 % ophthalmic solution INSTILL 1 GTT INTO OS D  . brimonidine-timolol (COMBIGAN) 0.2-0.5 % ophthalmic solution Place 1 drop into both eyes every 12 (twelve) hours.  [name] etanercept (ENBREL SURECLICK) 50 MG/ML injection Inject 0.98 mLs (50 mg total) into the skin once a week.  . fluticasone (FLONASE) 50 MCG/ACT nasal spray INSTILL 2 SPRAYS IN EACH NOSTRIL EVERY DAY  . folic acid (FOLVITE) 1 MG tablet TAKE 1 TABLET(1 MG) BY MOUTH DAILY  . HYDROcodone-acetaminophen (NORCO) 10-325 MG tablet Take 1 tablet by mouth every 6 (six) hours as needed.  . latanoprost (XALATAN) 0.005 % ophthalmic solution Place 1 drop into both eyes nightly.  . levothyroxine (SYNTHROID) 100 MCG tablet Take 1 tablet (100 mcg total) by mouth daily.  [name] lisinopril (ZESTRIL) 30 MG tablet Take 1 tablet (30 mg total) by mouth daily.  . mirtazapine (REMERON) 15 MG tablet Take 1 tablet (15 mg total) by mouth at bedtime.  [name] NAMZARIC 28-10 MG CP24 TAKE 1 CAPSULE BY MOUTH EVERY DAY  . omeprazole (PRILOSEC) 20 MG capsule TAKE 1 CAPSULE BY MOUTH EVERY DAY  . simvastatin (ZOCOR) 40  MG tablet Take 1 tablet (40 mg total) by mouth at bedtime.  [name] SYNTHROID 100 MCG tablet Take 1 tablet (100 mcg total) by mouth daily before breakfast.   No facility-administered encounter medications on file as of 09/16/2019.     Goals Addressed            This Visit's Progress   . "I need help getting an upright walker for mother'   Not on track    Daughter stated Current Barriers:  [name] Knowledge Deficits related to how to obtain DME, specifically an upright walker . Chronic Disease Management support and education needs related to HTN, RA, Vitamin D deficiency, Acquired Hypothyroidism, Memory loss   Nurse Case Manager Clinical Goal(s):  [name] Over the next 90 days, patient will work with the [place] and PCP to address needs related to DME and or other home safety equipment  CCM SW Interventions: Completed 09/16/2019  . Outbound call placed to [place] to follow up on DME orders o Determined order is unable to be located at this time . Collaboration with primary provider requesting order be re-sent . Scheduled follow up call to [place] over the next 48 hours to assess progression of patient goal to obtain DME equipment  CCM RN CM Interventions:  08/05/19 call completed with patient's daughter Jordan [name] . Evaluation of current treatment plan related to DME and home safety and patient's adherence to plan as established by provider. [name] with PCP [name], FNP regarding Rx  needed for an upright walker; instructed [name] to send the Rx to [facility] and provided the following fax # [phone] . Discussed plans with patient for ongoing care management follow up and provided patient with direct contact information for care management team . Assessed for falls and or near falls; assessed for fall risk and or other home safety concerns . Determined patient has experienced no recent falls; discussed daughter does feel that she needs additional PCA hours, currently only receiving  3-3.5 hours on weekdays and family feels her dementia has worsened . Sent in basket message to embedded BSW Jordan [name] requesting she reach out to Ms. [name] to offer assistance with how to navigate and initiate the request for additional PCA hours  Patent Self Care Activities:  . Unable to independently take medications as prescribed . Unable to self administer medications as prescribed  Please see past updates related to this goal by clicking on the "Past Updates" button in the selected goal          Follow Up Plan: SW will follow up with [facility] over the next two days.   Jordan [name], BSW, CDP Social Worker, Certified Dementia Practitioner [facility] Care Management [phone]  Total time spent performing care coordination and/or care management activities with the patient by phone or face to face = 10 minutes.

## 2019-09-18 ENCOUNTER — Telehealth: Payer: Self-pay

## 2019-09-18 ENCOUNTER — Other Ambulatory Visit: Payer: Self-pay | Admitting: Rheumatology

## 2019-09-18 ENCOUNTER — Ambulatory Visit (INDEPENDENT_AMBULATORY_CARE_PROVIDER_SITE_OTHER): Payer: Medicare Other

## 2019-09-18 DIAGNOSIS — M0579 Rheumatoid arthritis with rheumatoid factor of multiple sites without organ or systems involvement: Secondary | ICD-10-CM | POA: Diagnosis not present

## 2019-09-18 DIAGNOSIS — G309 Alzheimer's disease, unspecified: Secondary | ICD-10-CM

## 2019-09-18 DIAGNOSIS — F0280 Dementia in other diseases classified elsewhere without behavioral disturbance: Secondary | ICD-10-CM

## 2019-09-18 DIAGNOSIS — I10 Essential (primary) hypertension: Secondary | ICD-10-CM

## 2019-09-18 NOTE — Telephone Encounter (Signed)
Last Visit: 03/14/19 Next Visit: 09/25/19 Labs: 06/14/19 creatinine has increased.  TB Gold: 06/14/19  Patient advised she is due to update labs.   Okay to refill 30 day supply per Dr. Corliss Skains

## 2019-09-18 NOTE — Chronic Care Management (AMB) (Signed)
Chronic Care Management    Social Work Follow Up Note  09/18/2019 Name: Jordan [name] MRN: [id number] DOB: [birth year]  Jordan [name] is a 86 y.o. year old female who is a primary care patient of Jordan Felts, FNP. The CCM team was consulted for assistance with care coordination.   Review of patient status, including review of consultants reports, other relevant assessments, and collaboration with appropriate care team members and the patient's provider was performed as part of comprehensive patient evaluation and provision of chronic care management services.     Outpatient Encounter Medications as of 09/18/2019  Medication Sig  . albuterol (VENTOLIN HFA) 108 (90 Base) MCG/ACT inhaler Inhale 1 puff into the lungs every 6 (six) hours as needed for wheezing or shortness of breath.  . ALLERGY RELIEF 10 MG tablet TAKE 1 TABLET BY MOUTH EVERY DAY  . atropine 1 % ophthalmic solution INSTILL 1 GTT INTO OS D  . brimonidine-timolol (COMBIGAN) 0.2-0.5 % ophthalmic solution Place 1 drop into both eyes every 12 (twelve) hours.  [name] etanercept (ENBREL SURECLICK) 50 MG/ML injection Inject 0.98 mLs (50 mg total) into the skin once a week.  . fluticasone (FLONASE) 50 MCG/ACT nasal spray INSTILL 2 SPRAYS IN EACH NOSTRIL EVERY DAY  . folic acid (FOLVITE) 1 MG tablet TAKE 1 TABLET(1 MG) BY MOUTH DAILY  . HYDROcodone-acetaminophen (NORCO) 10-325 MG tablet Take 1 tablet by mouth every 6 (six) hours as needed.  . latanoprost (XALATAN) 0.005 % ophthalmic solution Place 1 drop into both eyes nightly.  . levothyroxine (SYNTHROID) 100 MCG tablet Take 1 tablet (100 mcg total) by mouth daily.  [name] lisinopril (ZESTRIL) 30 MG tablet Take 1 tablet (30 mg total) by mouth daily.  . mirtazapine (REMERON) 15 MG tablet Take 1 tablet (15 mg total) by mouth at bedtime.  [name] NAMZARIC 28-10 MG CP24 TAKE 1 CAPSULE BY MOUTH EVERY DAY  . omeprazole (PRILOSEC) 20 MG capsule TAKE 1 CAPSULE BY MOUTH EVERY DAY  . simvastatin (ZOCOR) 40  MG tablet Take 1 tablet (40 mg total) by mouth at bedtime.  [name] SYNTHROID 100 MCG tablet Take 1 tablet (100 mcg total) by mouth daily before breakfast.   No facility-administered encounter medications on file as of 09/18/2019.     Goals Addressed            This Visit's Progress   . "I need help getting an upright walker for mother'       Daughter stated Current Barriers:  [name] Knowledge Deficits related to how to obtain DME, specifically an upright walker . Chronic Disease Management support and education needs related to HTN, RA, Vitamin D deficiency, Acquired Hypothyroidism, Memory loss   Nurse Case Manager Clinical Goal(s):  [name] Over the next 90 days, patient will work with the CCM RN and PCP to address needs related to DME and or other home safety equipment  CCM SW Interventions: Completed 09/18/2019  . Outbound call placed to [facility] to follow up on DME orders o Determined order has been received but is missing ICD-10 code . Collaboration with patients primary care team to request updated information be sent to [facility] to complete order  CCM RN CM Interventions:  08/05/19 call completed with patient's daughter Jordan [name] . Evaluation of current treatment plan related to DME and home safety and patient's adherence to plan as established by provider. Jordan [name] with PCP Jordan Felts, FNP regarding Rx needed for an upright walker; instructed [name] to send the Rx to [facility] and  provided the following fax # [phone] . Discussed plans with patient for ongoing care management follow up and provided patient with direct contact information for care management team . Assessed for falls and or near falls; assessed for fall risk and or other home safety concerns . Determined patient has experienced no recent falls; discussed daughter does feel that she needs additional PCA hours, currently only receiving 3-3.5 hours on weekdays and family feels her dementia has worsened . Sent in  basket message to embedded BSW Jordan [name] requesting she reach out to Jordan [name] to offer assistance with how to navigate and initiate the request for additional PCA hours  Patent Self Care Activities:  . Unable to independently take medications as prescribed . Unable to self administer medications as prescribed  Please see past updates related to this goal by clicking on the "Past Updates" button in the selected goal          Follow Up Plan: SW will follow up with patient by phone over the next week   Jordan [name], BSW, CDP Social Worker, Certified Dementia Practitioner [place] [facility] [place] Management [phone]  Total time spent performing care coordination and/or care management activities with the patient by phone or face to face = 4 minutes.

## 2019-09-23 NOTE — Progress Notes (Deleted)
Office Visit Note  Patient: Jordan [name]             Date of Birth: [birth year]           MRN: [id number]             PCP: [name], FNP Referring: [name], FNP Visit Date: 09/25/2019 Occupation: @GUAROCC @  Subjective:  No chief complaint on file.   History of Present Illness: Jordan [name] is a 86 y.o. female ***   Activities of Daily Living:  Patient reports morning stiffness for *** {minute/hour:19697}.   Patient {ACTIONS;DENIES/REPORTS:21021675::"Denies"} nocturnal pain.  Difficulty dressing/grooming: {ACTIONS;DENIES/REPORTS:21021675::"Denies"} Difficulty climbing stairs: {ACTIONS;DENIES/REPORTS:21021675::"Denies"} Difficulty getting out of chair: {ACTIONS;DENIES/REPORTS:21021675::"Denies"} Difficulty using hands for taps, buttons, cutlery, and/or writing: {ACTIONS;DENIES/REPORTS:21021675::"Denies"}  No Rheumatology ROS completed.   PMFS History:  Patient Active Problem List   Diagnosis Date Noted  . Alzheimer's disease ([place]) 08/13/2019  . Essential hypertension 08/13/2018  . Acquired hypothyroidism 08/13/2018  . Rash and nonspecific skin eruption 08/13/2018  . Memory loss 04/12/2017  . Depression 04/12/2017  . Rheumatoid arthritis involving multiple sites with positive rheumatoid factor ([place]) 08/17/2016  . High risk medication use 08/17/2016  . H/O total knee replacement, bilateral 08/17/2016  . Spondylosis of lumbar region without myelopathy or radiculopathy 08/17/2016  . History of glaucoma/ patient is legally blind 08/17/2016  . History of gastroesophageal reflux (GERD) 08/17/2016  . History of diverticulosis 08/17/2016  . History of hypothyroidism 08/17/2016  . history of asthmatic bronchitis 08/17/2016  . History of memory loss 08/17/2016  . History of atherosclerosis 08/17/2016    Past Medical History:  Diagnosis Date  . Allergy   . Anxiety    per pt's daughter  . Arthritis   . Asthma    pt reported  . Depression   . GERD  (gastroesophageal reflux disease)   . Hearing deficit L  . High cholesterol   . Hypertension   . Osteoporosis   . Stroke [facility])     No family history on file. Past Surgical History:  Procedure Laterality Date  . ABDOMINAL HYSTERECTOMY    . REPLACEMENT TOTAL KNEE BILATERAL Bilateral   . WRIST SURGERY Left    Social History   Social History Narrative   Lives at home with grandson.   Right-handed.   2-3 cups coffee per day.    There is no immunization history on file for this patient.   Objective: Vital Signs: There were no vitals taken for this visit.   Physical Exam   Musculoskeletal Exam: ***  CDAI Exam: CDAI Score: -- Patient Global: --; Provider Global: -- Swollen: --; Tender: -- Joint Exam 09/25/2019   No joint exam has been documented for this visit   There is currently no information documented on the homunculus. Go to the Rheumatology activity and complete the homunculus joint exam.  Investigation: No additional findings.  Imaging: No results found.  Recent Labs: Lab Results  Component Value Date   WBC 10.0 06/14/2019   HGB 12.8 06/14/2019   PLT 249 06/14/2019   NA 136 06/14/2019   K 4.8 06/14/2019   CL 99 06/14/2019   CO2 25 06/14/2019   GLUCOSE 76 06/14/2019   BUN 17 06/14/2019   CREATININE 1.49 (H) 06/14/2019   BILITOT 0.5 06/14/2019   ALKPHOS 65 01/09/2017   AST 28 06/14/2019   ALT 15 06/14/2019   PROT 6.9 06/14/2019   ALBUMIN 4.0 01/09/2017   CALCIUM 8.7 06/14/2019   GFRAA 37 (L) 06/14/2019   QFTBGOLDPLUS NEGATIVE  06/14/2019    Speciality Comments: No specialty comments available.  Procedures:  No procedures performed Allergies: Penicillins   Assessment / Plan:     Visit Diagnoses: No diagnosis found.  Orders: No orders of the defined types were placed in this encounter.  No orders of the defined types were placed in this encounter.   Face-to-face time spent with patient was *** minutes. Greater than 50% of time was spent  in counseling and coordination of care.  Follow-Up Instructions: No follow-ups on file.   Ellen Henri, CMA  Note - This record has been created using Animal nutritionist.  Chart creation errors have been sought, but may not always  have been located. Such creation errors do not reflect on  the standard of medical care.

## 2019-09-24 ENCOUNTER — Ambulatory Visit: Payer: Self-pay

## 2019-09-24 DIAGNOSIS — F0280 Dementia in other diseases classified elsewhere without behavioral disturbance: Secondary | ICD-10-CM

## 2019-09-24 DIAGNOSIS — G309 Alzheimer's disease, unspecified: Secondary | ICD-10-CM

## 2019-09-24 DIAGNOSIS — M0579 Rheumatoid arthritis with rheumatoid factor of multiple sites without organ or systems involvement: Secondary | ICD-10-CM

## 2019-09-25 ENCOUNTER — Ambulatory Visit: Payer: Medicare Other | Admitting: Physician Assistant

## 2019-09-25 NOTE — Patient Instructions (Signed)
Social Worker Visit Information  Goals we discussed today:  Goals Addressed            This Visit's Progress   . "I need help getting an upright walker for mother'   Not on track    Daughter stated Current Barriers:  [name] Knowledge Deficits related to how to obtain DME, specifically an upright walker . Chronic Disease Management support and education needs related to HTN, RA, Vitamin D deficiency, Acquired Hypothyroidism, Memory loss   Nurse Case Manager Clinical Goal(s):  [name] Over the next 90 days, patient will work with the CCM RN and PCP to address needs related to DME and or other home safety equipment  CCM SW Interventions: Completed 09/24/2019  . Collaboration with CMA indicating patient co-pay for upright walker is $400 . Outbound call placed to patients daughter [name] to determine the family is not willing to pay for co-pay amount . Collaboration with Delsa Sale RN Case Manager to update on goal progression o RN Case Manager to follow up with the patient over the next 4-6 weeks  CCM RN CM Interventions:  08/05/19 call completed with patient's daughter [name] . Evaluation of current treatment plan related to DME and home safety and patient's adherence to plan as established by provider. [name] with PCP Arnette Felts, FNP regarding Rx needed for an upright walker; instructed [name] to send the Rx to [facility] and provided the following fax # [phone] . Discussed plans with patient for ongoing care management follow up and provided patient with direct contact information for care management team . Assessed for falls and or near falls; assessed for fall risk and or other home safety concerns . Determined patient has experienced no recent falls; discussed daughter does feel that she needs additional PCA hours, currently only receiving 3-3.5 hours on weekdays and family feels her dementia has worsened . Sent in basket message to embedded BSW Bevelyn Ngo requesting  she reach out to Ms. [name] to offer assistance with how to navigate and initiate the request for additional PCA hours  Patent Self Care Activities:  . Unable to independently take medications as prescribed . Unable to self administer medications as prescribed  Please see past updates related to this goal by clicking on the "Past Updates" button in the selected goal      . COMPLETED: Assist with care coordination needs related to patients PCA hours covered under Medicaid benefit       Current Barriers:  [name] Memory loss contributing to ongoing depression Dx . Limited ability to perform ADL and iADL's in the home . Limited local family support . Declining functional status due to vision impairment  Social Work Clinical Goal(s):  [name] Over the next 45 days the patient and her daughter, [name], will work with SW to address concerns related to increasing PCA hours  CCM SW Interventions: Completed 09/24/19 . Outbound call placed to [name] to assess progression of patient goal . Determined [name] has yet to be contacted by [facility] but feels the patient is currently stable with PCA hours and is not interested in completing re-assessment at this time o Encouraged [name] to contact SW if future assistance is needed  Patient Self Care Activities:  . Patient with memory impairment, daughter states understanding of plan  . Unable to perform ADLs independently . Unable to perform IADLs independently .    Please see past updates related to this goal by clicking on the "Past  Updates" button in the selected goal          Follow Up Plan: No SW follow up planned at this time. The patient is encouraged to contact SW with future resource needs.   Daneen Schick, BSW, CDP Social Worker, Certified Dementia Practitioner [facility] [phone]

## 2019-09-25 NOTE — Chronic Care Management (AMB) (Signed)
Chronic Care Management    Social Work Follow Up Note  09/25/2019 Name: Nihal Doan MRN: [id number] DOB: 05/20/1934  Lurae Hornbrook is a 86 y.o. year old female who is a primary care patient of Arnette Felts, FNP. The CCM team was consulted for assistance with care coordination.   Review of patient status, including review of consultants reports, other relevant assessments, and collaboration with appropriate care team members and the patient's provider was performed as part of comprehensive patient evaluation and provision of chronic care management services.    SDOH (Social Determinants of Health) assessments performed: No   Outpatient Encounter Medications as of 09/24/2019  Medication Sig  . albuterol (VENTOLIN HFA) 108 (90 Base) MCG/ACT inhaler Inhale 1 puff into the lungs every 6 (six) hours as needed for wheezing or shortness of breath.  . ALLERGY RELIEF 10 MG tablet TAKE 1 TABLET BY MOUTH EVERY DAY  . atropine 1 % ophthalmic solution INSTILL 1 GTT INTO OS D  . brimonidine-timolol (COMBIGAN) 0.2-0.5 % ophthalmic solution Place 1 drop into both eyes every 12 (twelve) hours.  Elgie Collard SURECLICK 50 MG/ML injection Inject 0.98 mLs (50 mg total) into the skin once a week.  . fluticasone (FLONASE) 50 MCG/ACT nasal spray INSTILL 2 SPRAYS IN EACH NOSTRIL EVERY DAY  . folic acid (FOLVITE) 1 MG tablet TAKE 1 TABLET(1 MG) BY MOUTH DAILY  . HYDROcodone-acetaminophen (NORCO) 10-325 MG tablet Take 1 tablet by mouth every 6 (six) hours as needed.  . latanoprost (XALATAN) 0.005 % ophthalmic solution Place 1 drop into both eyes nightly.  . levothyroxine (SYNTHROID) 100 MCG tablet Take 1 tablet (100 mcg total) by mouth daily.  [name] lisinopril (ZESTRIL) 30 MG tablet Take 1 tablet (30 mg total) by mouth daily.  . mirtazapine (REMERON) 15 MG tablet Take 1 tablet (15 mg total) by mouth at bedtime.  [name] NAMZARIC 28-10 MG CP24 TAKE 1 CAPSULE BY MOUTH EVERY DAY  . omeprazole (PRILOSEC) 20 MG capsule TAKE 1  CAPSULE BY MOUTH EVERY DAY  . simvastatin (ZOCOR) 40 MG tablet Take 1 tablet (40 mg total) by mouth at bedtime.  [name] SYNTHROID 100 MCG tablet Take 1 tablet (100 mcg total) by mouth daily before breakfast.   No facility-administered encounter medications on file as of 09/24/2019.     Goals Addressed            This Visit's Progress   . "I need help getting an upright walker for mother'   Not on track    Daughter stated Current Barriers:  [name] Knowledge Deficits related to how to obtain DME, specifically an upright walker . Chronic Disease Management support and education needs related to HTN, RA, Vitamin D deficiency, Acquired Hypothyroidism, Memory loss   Nurse Case Manager Clinical Goal(s):  [name] Over the next 90 days, patient will work with the CCM RN and PCP to address needs related to DME and or other home safety equipment  CCM SW Interventions: Completed 09/24/2019  . Collaboration with CMA indicating patient co-pay for upright walker is $400 . Outbound call placed to patients daughter [name] to determine the family is not willing to pay for co-pay amount . Collaboration with Delsa Sale RN Case Manager to update on goal progression o RN Case Manager to follow up with the patient over the next 4-6 weeks  CCM RN CM Interventions:  08/05/19 call completed with patient's daughter [name] . Evaluation of current treatment plan related to DME and home safety and patient's adherence to plan  as established by provider. [name] with PCP [name], FNP regarding Rx needed for an upright walker; instructed [name] to send the Rx to [facility] and provided the following fax # [phone] . Discussed plans with patient for ongoing care management follow up and provided patient with direct contact information for care management team . Assessed for falls and or near falls; assessed for fall risk and or other home safety concerns . Determined patient has experienced no recent falls;  discussed daughter does feel that she needs additional PCA hours, currently only receiving 3-3.5 hours on weekdays and family feels her dementia has worsened . Sent in basket message to embedded BSW Daneen Schick requesting she reach out to [name] to offer assistance with how to navigate and initiate the request for additional PCA hours  Patent Self Care Activities:  . Unable to independently take medications as prescribed . Unable to self administer medications as prescribed  Please see past updates related to this goal by clicking on the "Past Updates" button in the selected goal      . COMPLETED: Assist with care coordination needs related to patients PCA hours covered under Medicaid benefit       Current Barriers:  [name] Memory loss contributing to ongoing depression Dx . Limited ability to perform ADL and iADL's in the home . Limited local family support . Declining functional status due to vision impairment  Social Work Clinical Goal(s):  [name] Over the next 45 days the patient and her daughter, [name], will work with SW to address concerns related to increasing PCA hours  CCM SW Interventions: Completed 09/24/19 . Outbound call placed to [name] to assess progression of patient goal . Determined [name] has yet to be contacted by [facility] but feels the patient is currently stable with PCA hours and is not interested in completing re-assessment at this time o Encouraged [name] to contact SW if future assistance is needed  Patient Self Care Activities:  . Patient with memory impairment, daughter states understanding of plan  . Unable to perform ADLs independently . Unable to perform IADLs independently .    Please see past updates related to this goal by clicking on the "Past Updates" button in the selected goal          Follow Up Plan: No SW follow up planned at this time. The patient will be contacted by RN Case Manager over the next 4-6  weeks.   Daneen Schick, BSW, CDP Social Worker, Certified Dementia Practitioner [place] [facility] [phone]  Total time spent performing care coordination and/or care management activities with the patient by phone or face to face = 8 minutes.

## 2019-09-27 DIAGNOSIS — Z049 Encounter for examination and observation for unspecified reason: Secondary | ICD-10-CM | POA: Diagnosis not present

## 2019-10-14 NOTE — Progress Notes (Deleted)
Office Visit Note  Patient: Jordan [name]             Date of Birth: February 02, 1934           MRN: [id number]             PCP: [name], FNP Referring: [name], FNP Visit Date: 10/22/2019 Occupation: @GUAROCC @  Subjective:  No chief complaint on file.   History of Present Illness: Jordan [name] is a 86 y.o. female ***   Activities of Daily Living:  Patient reports morning stiffness for *** {minute/hour:19697}.   Patient {ACTIONS;DENIES/REPORTS:21021675::"Denies"} nocturnal pain.  Difficulty dressing/grooming: {ACTIONS;DENIES/REPORTS:21021675::"Denies"} Difficulty climbing stairs: {ACTIONS;DENIES/REPORTS:21021675::"Denies"} Difficulty getting out of chair: {ACTIONS;DENIES/REPORTS:21021675::"Denies"} Difficulty using hands for taps, buttons, cutlery, and/or writing: {ACTIONS;DENIES/REPORTS:21021675::"Denies"}  No Rheumatology ROS completed.   PMFS History:  Patient Active Problem List   Diagnosis Date Noted  . Alzheimer's disease ([place]) 08/13/2019  . Essential hypertension 08/13/2018  . Acquired hypothyroidism 08/13/2018  . Rash and nonspecific skin eruption 08/13/2018  . Memory loss 04/12/2017  . Depression 04/12/2017  . Rheumatoid arthritis involving multiple sites with positive rheumatoid factor ([place]) 08/17/2016  . High risk medication use 08/17/2016  . H/O total knee replacement, bilateral 08/17/2016  . Spondylosis of lumbar region without myelopathy or radiculopathy 08/17/2016  . History of glaucoma/ patient is legally blind 08/17/2016  . History of gastroesophageal reflux (GERD) 08/17/2016  . History of diverticulosis 08/17/2016  . History of hypothyroidism 08/17/2016  . history of asthmatic bronchitis 08/17/2016  . History of memory loss 08/17/2016  . History of atherosclerosis 08/17/2016    Past Medical History:  Diagnosis Date  . Allergy   . Anxiety    per pt's daughter  . Arthritis   . Asthma    pt reported  . Depression   . GERD  (gastroesophageal reflux disease)   . Hearing deficit L  . High cholesterol   . Hypertension   . Osteoporosis   . Stroke [facility])     No family history on file. Past Surgical History:  Procedure Laterality Date  . ABDOMINAL HYSTERECTOMY    . REPLACEMENT TOTAL KNEE BILATERAL Bilateral   . WRIST SURGERY Left    Social History   Social History Narrative   Lives at home with grandson.   Right-handed.   2-3 cups coffee per day.    There is no immunization history on file for this patient.   Objective: Vital Signs: There were no vitals taken for this visit.   Physical Exam   Musculoskeletal Exam: ***  CDAI Exam: CDAI Score: -- Patient Global: --; Provider Global: -- Swollen: --; Tender: -- Joint Exam 10/22/2019   No joint exam has been documented for this visit   There is currently no information documented on the homunculus. Go to the Rheumatology activity and complete the homunculus joint exam.  Investigation: No additional findings.  Imaging: No results found.  Recent Labs: Lab Results  Component Value Date   WBC 10.0 06/14/2019   HGB 12.8 06/14/2019   PLT 249 06/14/2019   NA 136 06/14/2019   K 4.8 06/14/2019   CL 99 06/14/2019   CO2 25 06/14/2019   GLUCOSE 76 06/14/2019   BUN 17 06/14/2019   CREATININE 1.49 (H) 06/14/2019   BILITOT 0.5 06/14/2019   ALKPHOS 65 01/09/2017   AST 28 06/14/2019   ALT 15 06/14/2019   PROT 6.9 06/14/2019   ALBUMIN 4.0 01/09/2017   CALCIUM 8.7 06/14/2019   GFRAA 37 (L) 06/14/2019   QFTBGOLDPLUS NEGATIVE  06/14/2019    Speciality Comments: No specialty comments available.  Procedures:  No procedures performed Allergies: Penicillins   Assessment / Plan:     Visit Diagnoses: No diagnosis found.  Orders: No orders of the defined types were placed in this encounter.  No orders of the defined types were placed in this encounter.   Face-to-face time spent with patient was *** minutes. Greater than 50% of time was spent  in counseling and coordination of care.  Follow-Up Instructions: No follow-ups on file.   Ellen Henri, CMA  Note - This record has been created using Animal nutritionist.  Chart creation errors have been sought, but may not always  have been located. Such creation errors do not reflect on  the standard of medical care.

## 2019-10-18 ENCOUNTER — Telehealth: Payer: Self-pay

## 2019-10-22 ENCOUNTER — Ambulatory Visit: Payer: Medicare Other | Admitting: Rheumatology

## 2019-10-25 DIAGNOSIS — Z049 Encounter for examination and observation for unspecified reason: Secondary | ICD-10-CM | POA: Diagnosis not present

## 2019-10-30 ENCOUNTER — Other Ambulatory Visit: Payer: Self-pay | Admitting: Nurse Practitioner

## 2019-10-30 DIAGNOSIS — G4700 Insomnia, unspecified: Secondary | ICD-10-CM

## 2019-10-30 DIAGNOSIS — I10 Essential (primary) hypertension: Secondary | ICD-10-CM

## 2019-10-31 ENCOUNTER — Other Ambulatory Visit: Payer: Self-pay | Admitting: Rheumatology

## 2019-11-01 NOTE — Telephone Encounter (Addendum)
Last Visit: 03/14/19 Next Visit: 11/13/19 Labs: 06/14/19 creatinine has increased.  TB Gold: 06/14/19  Attempted to contact the patient and unable to leave a message. Voicemail is full.

## 2019-11-05 NOTE — Progress Notes (Signed)
Office Visit Note  Patient: Jordan [name]             Date of Birth: 30-May-1934           MRN: [id number]             PCP: [name], FNP Referring: [name], FNP Visit Date: 11/13/2019 Occupation: @GUAROCC @  Subjective:  Stiffness in both knee replacements   History of Present Illness: Jordan [name] is a 86 y.o. female with history of seropositive rheumatoid arthritis and osteoporosis.  She is on Enbrel 50 mg sq injections once weekly. She denies any recent rheumatoid arthritis flares.  She continues to experience stiffness and discomfort in bilateral knee replacements.  She has occasional discomfort in both hands.  She denies any joint swelling.  She wears arthritis gloves daily.   Activities of Daily Living:  Patient reports joint stiffness all day  Patient Denies nocturnal pain.  Difficulty dressing/grooming: Reports Difficulty climbing stairs: Reports Difficulty getting out of chair: Reports Difficulty using hands for taps, buttons, cutlery, and/or writing: Reports  Review of Systems  Constitutional: Positive for fatigue.  HENT: Positive for mouth dryness. Negative for mouth sores and nose dryness.   Eyes: Positive for dryness. Negative for pain and visual disturbance.  Respiratory: Positive for shortness of breath. Negative for cough, hemoptysis and difficulty breathing.   Cardiovascular: Positive for swelling in legs/feet. Negative for chest pain, palpitations and hypertension.  Gastrointestinal: Positive for diarrhea. Negative for blood in stool and constipation.  Genitourinary: Negative for difficulty urinating and painful urination.  Musculoskeletal: Positive for arthralgias, joint pain, joint swelling, muscle weakness and morning stiffness. Negative for myalgias, muscle tenderness and myalgias.  Skin: Negative for color change, pallor, rash, hair loss, nodules/bumps, skin tightness, ulcers and sensitivity to sunlight.  Allergic/Immunologic: Negative for  susceptible to infections.  Neurological: Negative for dizziness, numbness and headaches.  Hematological: Negative for bruising/bleeding tendency and swollen glands.  Psychiatric/Behavioral: Positive for sleep disturbance. Negative for depressed mood. The patient is not nervous/anxious.     PMFS History:  Patient Active Problem List   Diagnosis Date Noted  . Alzheimer's disease ([place]) 08/13/2019  . Essential hypertension 08/13/2018  . Acquired hypothyroidism 08/13/2018  . Rash and nonspecific skin eruption 08/13/2018  . Memory loss 04/12/2017  . Depression 04/12/2017  . Rheumatoid arthritis involving multiple sites with positive rheumatoid factor ([place]) 08/17/2016  . High risk medication use 08/17/2016  . H/O total knee replacement, bilateral 08/17/2016  . Spondylosis of lumbar region without myelopathy or radiculopathy 08/17/2016  . History of glaucoma/ patient is legally blind 08/17/2016  . History of gastroesophageal reflux (GERD) 08/17/2016  . History of diverticulosis 08/17/2016  . History of hypothyroidism 08/17/2016  . history of asthmatic bronchitis 08/17/2016  . History of memory loss 08/17/2016  . History of atherosclerosis 08/17/2016    Past Medical History:  Diagnosis Date  . Allergy   . Anxiety    per pt's daughter  . Arthritis   . Asthma    pt reported  . Depression   . GERD (gastroesophageal reflux disease)   . Hearing deficit L  . High cholesterol   . Hypertension   . Osteoporosis   . Rheumatoid arthritis ([place])   . Stroke [facility])     History reviewed. No pertinent family history. Past Surgical History:  Procedure Laterality Date  . ABDOMINAL HYSTERECTOMY    . KNEE ARTHROPLASTY    . REPLACEMENT TOTAL KNEE BILATERAL Bilateral   . WRIST SURGERY Left  Social History   Social History Narrative   Lives at home with grandson.   Right-handed.   2-3 cups coffee per day.    There is no immunization history on file for this patient.   Objective: Vital  Signs: BP (!) 200/104 (BP Location: Right Arm, Patient Position: Sitting, Cuff Size: Normal)   Pulse 86   Resp 18   Ht 5\' 1"  (1.549 m)   Wt 98 lb 12.8 oz (44.8 kg)   BMI 18.67 kg/m    Physical Exam Vitals and nursing note reviewed.  Constitutional:      Appearance: She is well-developed.  HENT:     Head: Normocephalic and atraumatic.  Eyes:     Conjunctiva/sclera: Conjunctivae normal.  Pulmonary:     Effort: Pulmonary effort is normal.  Abdominal:     General: Bowel sounds are normal.     Palpations: Abdomen is soft.  Musculoskeletal:     Cervical back: Normal range of motion.  Lymphadenopathy:     Cervical: No cervical adenopathy.  Skin:    General: Skin is warm and dry.     Capillary Refill: Capillary refill takes less than 2 seconds.  Neurological:     Mental Status: She is alert and oriented to person, place, and time.  Psychiatric:        Behavior: Behavior normal.      Musculoskeletal Exam: C-spine good ROM. Thoracic kyphosis noted. Shoulder joints forward flexion to about 120 degrees.  Elbow joints good ROM.  Thickening of bilateral 1st MCPs.  No synovitis noted.  Hip joints good ROM.  Bilateral knee replacements have good ROM with discomfort bilaterally. No warmth or effusion noted.  Ankle joints have good ROM with no tenderness or inflammation.    CDAI Exam: CDAI Score: -- Patient Global: --; Provider Global: -- Swollen: --; Tender: -- Joint Exam 11/13/2019   No joint exam has been documented for this visit   There is currently no information documented on the homunculus. Go to the Rheumatology activity and complete the homunculus joint exam.  Investigation: No additional findings.  Imaging: No results found.  Recent Labs: Lab Results  Component Value Date   WBC 10.0 06/14/2019   HGB 12.8 06/14/2019   PLT 249 06/14/2019   NA 136 06/14/2019   K 4.8 06/14/2019   CL 99 06/14/2019   CO2 25 06/14/2019   GLUCOSE 76 06/14/2019   BUN 17 06/14/2019    CREATININE 1.49 (H) 06/14/2019   BILITOT 0.5 06/14/2019   ALKPHOS 65 01/09/2017   AST 28 06/14/2019   ALT 15 06/14/2019   PROT 6.9 06/14/2019   ALBUMIN 4.0 01/09/2017   CALCIUM 8.7 06/14/2019   GFRAA 37 (L) 06/14/2019   QFTBGOLDPLUS NEGATIVE 06/14/2019    Speciality Comments: No specialty comments available.  Procedures:  No procedures performed Allergies: Penicillins   Assessment / Plan:     Visit Diagnoses: Rheumatoid arthritis involving multiple sites with positive rheumatoid factor (HCC): She has no synovitis on exam.  She has not had any recent rheumatoid arthritis flares.  She is clinically doing well on Enbrel 50 mg subcutaneous injections every 7 days.  She has not missed any doses of Enbrel recently.  She continues to experience intermittent stiffness and discomfort in both hands and wears arthritis gloves on a daily basis.  She experiences stiffness in both knee replacements but no warmth or effusion was noted on exam today.  She will continue on Enbrel 50 mg subcu injections once weekly.  She  was advised to notify us if she develops increased joint pain or joint swelling.  She will follow-up in the office in 5 months.  High risk medication use - Enbrel Sureclick 50 mg every 7 days.  CBC and CMP were drawn on 06/14/19.  TB gold negative on 06/14/2019.  She is due to update CBC and CMP today.  She will be due for lab work in July and every 3 months to monitor for drug toxicity.  Standing orders for CBC and CMP were placed today.- Plan: CBC with Differential/Platelet, COMPLETE METABOLIC PANEL WITH GFR  H/O total knee replacement, bilateral: She experiences discomfort and stiffness in both knee replacements especially after sitting for prolonged periods of time. She uses a cane to assist with ambulation.  DDD (degenerative disc disease), lumbar: She experiences intermittent discomfort and stiffness in her lower back.  No symptoms of radiculopathy.   Age-related osteoporosis without  current pathological fracture - Scan on 06/19/2017 showed T-score of -3 at left femur neck and no baseline for comparison of BMD change. Future order for DEXA in place.   Essential hypertension: Patient presents today with a severe elevation in her blood pressure.  Her blood pressure was 200/104 (manual). According to the patient she takes lisinopril 30 mg 1 tablet daily. She is currently experiencing a headache.  We discussed sending her to the ED vs. Dr. Verl Dicker office. She declined going to the ED at this time. She would like to be evaluated by Dr. Jacinto Halim urgently.   Other medical conditions are listed as follows:   History of glaucoma/ patient is legally blind  History of diverticulosis  History of memory loss  History of atherosclerosis  history of asthmatic bronchitis  History of gastroesophageal reflux (GERD)  History of hypothyroidism  Orders: Orders Placed This Encounter  Procedures  . CBC with Differential/Platelet  . COMPLETE METABOLIC PANEL WITH GFR  . CBC with Differential/Platelet  . COMPLETE METABOLIC PANEL WITH GFR   No orders of the defined types were placed in this encounter.    Follow-Up Instructions: Return in about 5 months (around 04/14/2020) for Rheumatoid arthritis.   Gearldine Bienenstock, PA-C  Note - This record has been created using Dragon software.  Chart creation errors have been sought, but may not always  have been located. Such creation errors do not reflect on  the standard of medical care.

## 2019-11-13 ENCOUNTER — Encounter: Payer: Self-pay | Admitting: Physician Assistant

## 2019-11-13 ENCOUNTER — Encounter (HOSPITAL_COMMUNITY): Payer: Self-pay

## 2019-11-13 ENCOUNTER — Other Ambulatory Visit: Payer: Self-pay

## 2019-11-13 ENCOUNTER — Ambulatory Visit: Payer: Self-pay

## 2019-11-13 ENCOUNTER — Emergency Department (HOSPITAL_COMMUNITY)
Admission: EM | Admit: 2019-11-13 | Discharge: 2019-11-13 | Disposition: A | Discharge status: Home / Self Care | Payer: Medicare Other | Attending: Emergency Medicine | Admitting: Emergency Medicine

## 2019-11-13 ENCOUNTER — Ambulatory Visit (INDEPENDENT_AMBULATORY_CARE_PROVIDER_SITE_OTHER): Payer: Medicare Other | Admitting: Physician Assistant

## 2019-11-13 VITALS — BP 200/104 | HR 86 | Resp 18 | Ht 61.0 in | Wt 98.8 lb

## 2019-11-13 DIAGNOSIS — M069 Rheumatoid arthritis, unspecified: Secondary | ICD-10-CM | POA: Diagnosis not present

## 2019-11-13 DIAGNOSIS — R531 Weakness: Secondary | ICD-10-CM | POA: Diagnosis not present

## 2019-11-13 DIAGNOSIS — E038 Other specified hypothyroidism: Secondary | ICD-10-CM | POA: Insufficient documentation

## 2019-11-13 DIAGNOSIS — Z87891 Personal history of nicotine dependence: Secondary | ICD-10-CM | POA: Diagnosis not present

## 2019-11-13 DIAGNOSIS — Z96653 Presence of artificial knee joint, bilateral: Secondary | ICD-10-CM | POA: Diagnosis not present

## 2019-11-13 DIAGNOSIS — Z8679 Personal history of other diseases of the circulatory system: Secondary | ICD-10-CM

## 2019-11-13 DIAGNOSIS — M0579 Rheumatoid arthritis with rheumatoid factor of multiple sites without organ or systems involvement: Secondary | ICD-10-CM

## 2019-11-13 DIAGNOSIS — I10 Essential (primary) hypertension: Secondary | ICD-10-CM

## 2019-11-13 DIAGNOSIS — M810 Age-related osteoporosis without current pathological fracture: Secondary | ICD-10-CM

## 2019-11-13 DIAGNOSIS — G309 Alzheimer's disease, unspecified: Secondary | ICD-10-CM | POA: Insufficient documentation

## 2019-11-13 DIAGNOSIS — Z87898 Personal history of other specified conditions: Secondary | ICD-10-CM

## 2019-11-13 DIAGNOSIS — Z79899 Other long term (current) drug therapy: Secondary | ICD-10-CM

## 2019-11-13 DIAGNOSIS — M5136 Other intervertebral disc degeneration, lumbar region: Secondary | ICD-10-CM | POA: Diagnosis not present

## 2019-11-13 DIAGNOSIS — Z8719 Personal history of other diseases of the digestive system: Secondary | ICD-10-CM

## 2019-11-13 DIAGNOSIS — Z8669 Personal history of other diseases of the nervous system and sense organs: Secondary | ICD-10-CM

## 2019-11-13 DIAGNOSIS — Z8639 Personal history of other endocrine, nutritional and metabolic disease: Secondary | ICD-10-CM

## 2019-11-13 DIAGNOSIS — Z8709 Personal history of other diseases of the respiratory system: Secondary | ICD-10-CM

## 2019-11-13 LAB — COMPLETE METABOLIC PANEL WITH GFR
AG Ratio: 1.2 (calc) (ref 1.0–2.5)
ALT: 12 U/L (ref 6–29)
AST: 18 U/L (ref 10–35)
Albumin: 3.9 g/dL (ref 3.6–5.1)
Alkaline phosphatase (APISO): 74 U/L (ref 37–153)
BUN: 12 mg/dL (ref 7–25)
CO2: 27 mmol/L (ref 20–32)
Calcium: 9.7 mg/dL (ref 8.6–10.4)
Chloride: 105 mmol/L (ref 98–110)
Creat: 0.71 mg/dL (ref 0.60–0.88)
GFR, Est African American: 91 mL/min/{1.73_m2} (ref >=60)
GFR, Est Non African American: 79 mL/min/{1.73_m2} (ref >=60)
Globulin: 3.3 g/dL (calc) (ref 1.9–3.7)
Glucose, Bld: 71 mg/dL (ref 65–99)
Potassium: 4.2 mmol/L (ref 3.5–5.3)
Sodium: 141 mmol/L (ref 135–146)
Total Bilirubin: 0.4 mg/dL (ref 0.2–1.2)
Total Protein: 7.2 g/dL (ref 6.1–8.1)

## 2019-11-13 LAB — CBC
HCT: 39.5 % (ref 36.0–46.0)
Hemoglobin: 12.3 g/dL (ref 12.0–15.0)
MCH: 22.6 pg — ABNORMAL LOW (ref 26.0–34.0)
MCHC: 31.1 g/dL (ref 30.0–36.0)
MCV: 72.6 fL — ABNORMAL LOW (ref 80.0–100.0)
Platelets: 231 10*3/uL (ref 150–400)
RBC: 5.44 MIL/uL — ABNORMAL HIGH (ref 3.87–5.11)
RDW: 15.9 % — ABNORMAL HIGH (ref 11.5–15.5)
WBC: 7 10*3/uL (ref 4.0–10.5)
nRBC: 0 % (ref 0.0–0.2)

## 2019-11-13 LAB — BASIC METABOLIC PANEL
Anion gap: 11 (ref 5–15)
BUN: 9 mg/dL (ref 8–23)
CO2: 27 mmol/L (ref 22–32)
Calcium: 9.4 mg/dL (ref 8.9–10.3)
Chloride: 104 mmol/L (ref 98–111)
Creatinine, Ser: 0.69 mg/dL (ref 0.44–1.00)
GFR calc Af Amer: >60 mL/min (ref >60)
GFR calc non Af Amer: >60 mL/min (ref >60)
Glucose, Bld: 81 mg/dL (ref 70–99)
Potassium: 3.7 mmol/L (ref 3.5–5.1)
Sodium: 142 mmol/L (ref 135–145)

## 2019-11-13 LAB — CBC WITH DIFFERENTIAL/PLATELET
Absolute Monocytes: 755 cells/uL (ref 200–950)
Basophils Absolute: 52 cells/uL (ref 0–200)
Basophils Relative: 0.7 %
Eosinophils Absolute: 37 cells/uL (ref 15–500)
Eosinophils Relative: 0.5 %
HCT: 39.9 % (ref 35.0–45.0)
Hemoglobin: 12.6 g/dL (ref 11.7–15.5)
Lymphs Abs: 3293 cells/uL (ref 850–3900)
MCH: 23.1 pg — ABNORMAL LOW (ref 27.0–33.0)
MCHC: 31.6 g/dL — ABNORMAL LOW (ref 32.0–36.0)
MCV: 73.2 fL — ABNORMAL LOW (ref 80.0–100.0)
Monocytes Relative: 10.2 %
Neutro Abs: 3263 cells/uL (ref 1500–7800)
Neutrophils Relative %: 44.1 %
Platelets: 213 10*3/uL (ref 140–400)
RBC: 5.45 10*6/uL — ABNORMAL HIGH (ref 3.80–5.10)
RDW: 15.9 % — ABNORMAL HIGH (ref 11.0–15.0)
Total Lymphocyte: 44.5 %
WBC: 7.4 10*3/uL (ref 3.8–10.8)

## 2019-11-13 LAB — URINALYSIS, ROUTINE W REFLEX MICROSCOPIC
Bilirubin Urine: NEGATIVE
Glucose, UA: NEGATIVE mg/dL
Hgb urine dipstick: NEGATIVE
Ketones, ur: NEGATIVE mg/dL
Leukocytes,Ua: NEGATIVE
Nitrite: NEGATIVE
Protein, ur: NEGATIVE mg/dL
Specific Gravity, Urine: 1.005 (ref 1.005–1.030)
pH: 7 (ref 5.0–8.0)

## 2019-11-13 MED ORDER — AMLODIPINE BESYLATE 5 MG PO TABS: 5.0000 mg | ORAL_TABLET | Freq: Every day | ORAL | 0 refills | Status: DC

## 2019-11-13 NOTE — Discharge Instructions (Addendum)
I am starting you on a low dose of another blood pressure medication. I want you to keep a log of your blood pressure at home. This is the best way for [place] to know how to adjust your medications. Follow-up with your PCP or Dr Jacinto Halim in a week.

## 2019-11-13 NOTE — ED Triage Notes (Signed)
Pt sent by PCP for further evaluation of hypertension and generalized weakness, pt denies chest pain but having some SOB. Pt a.o, resp e.u at this time. Denies headache but does have intermittent dizziness

## 2019-11-13 NOTE — ED Notes (Signed)
Repeat EKG when Pt is laying down.

## 2019-11-13 NOTE — Chronic Care Management (AMB) (Signed)
  Care Management    Consult Note  11/13/2019 Name: Jordan [name] MRN: [id number] DOB: 12/09/1933  Care management team received notification of patient's recent emergency department visit related to HTN .Based on review of health record, Jordan [name] is currently active in the embedded care coordination program.    Review of patient status, including review of consultants reports, relevant laboratory and other test results, and collaboration with appropriate care team members and the patient's provider was performed as part of comprehensive patient evaluation and provision of chronic care management services.      Plan: Collaboration with RN Care Manager regarding patient ED visit. Scheduled follow up call to assess for care coordination needs.  Bevelyn Ngo, BSW, CDP Social Worker, Certified Dementia Practitioner [facility] Care Management [phone]

## 2019-11-14 ENCOUNTER — Ambulatory Visit: Payer: Self-pay

## 2019-11-14 DIAGNOSIS — I10 Essential (primary) hypertension: Secondary | ICD-10-CM

## 2019-11-14 NOTE — Chronic Care Management (AMB) (Signed)
Chronic Care Management    Social Work Follow Up Note  11/14/2019 Name: Jordan [name] MRN: [id number] DOB: [birth year]  Jordan [name] is a 86 y.o. year old female who is a primary care patient of [name], [place]. The CCM team was consulted for assistance with care coordination.   Review of patient status, including review of consultants reports, other relevant assessments, and collaboration with appropriate care team members and the patient's provider was performed as part of comprehensive patient evaluation and provision of chronic care management services.    SDOH (Social Determinants of Health) assessments performed: No    Outpatient Encounter Medications as of 11/14/2019  Medication Sig  . albuterol (VENTOLIN HFA) 108 (90 Base) MCG/ACT inhaler Inhale 1 puff into the lungs every 6 (six) hours as needed for wheezing or shortness of breath.  . ALLERGY RELIEF 10 MG tablet TAKE 1 TABLET BY MOUTH EVERY DAY  . amLODipine (NORVASC) 5 MG tablet Take 1 tablet (5 mg total) by mouth daily.  [name] atropine 1 % ophthalmic solution INSTILL 1 GTT INTO OS D  . brimonidine-timolol (COMBIGAN) 0.2-0.5 % ophthalmic solution Place 1 drop into both eyes every 12 (twelve) hours.  [name] SURECLICK 50 MG/ML injection Inject 0.98 mLs (50 mg total) into the skin once a week.  . fluticasone (FLONASE) 50 MCG/ACT nasal spray INSTILL 2 SPRAYS IN EACH NOSTRIL EVERY DAY  . folic acid (FOLVITE) 1 MG tablet TAKE 1 TABLET(1 MG) BY MOUTH DAILY  . latanoprost (XALATAN) 0.005 % ophthalmic solution Place 1 drop into both eyes nightly.  . levothyroxine (SYNTHROID) 100 MCG tablet Take 1 tablet (100 mcg total) by mouth daily.  [name] lisinopril (ZESTRIL) 30 MG tablet TAKE 1 TABLET BY MOUTH EVERY DAY  . NAMZARIC 28-10 MG CP24 TAKE 1 CAPSULE BY MOUTH EVERY DAY  . omeprazole (PRILOSEC) 20 MG capsule TAKE 1 CAPSULE BY MOUTH EVERY DAY  . simvastatin (ZOCOR) 40 MG tablet TAKE 1 TABLET BY MOUTH AT BEDTIME  . SYNTHROID 100 MCG tablet  TAKE 1 TABLET BY MOUTH EVERY DAY BEFORE BREAKFAST   No facility-administered encounter medications on file as of 11/14/2019.     Goals Addressed            This Visit's Progress   . I would like to manage my mother's blood pressure       Current Barriers:  [name] Knowledge Deficits related to disease process and Self Health management of HTN . Chronic Disease Management support and education needs related to Hypertension, Rheumatoid Arthritis, Vitamin D deficiency, Acquired Hypothyroidism, Memory Loss  . Knowledge Deficits related to disease state management of blood pressure and Non Adherence to prescribed medication regimen . Latest blood pressure reading 126/80 at last PCP visit (was 162/100 at previous [place]) . Scr 0.71 to 1.49 . Current anti-hypertensives: lisinopril . Per PCP notes in 05/2019: o This is a chronic problem. The current episode started more than 1 year ago. The problem is unchanged. The problem is uncontrolled. Pertinent negatives include no anxiety or headaches. Agents associated with hypertension include thyroid hormones. Risk factors for coronary artery disease include sedentary lifestyle. Past treatments include ACE inhibitors. There are no compliance problems.  There is no history of angina. Identifiable causes of hypertension include a thyroid problem.   Pharmacist Clinical Goal(s):  [name] Over the next 90 days, patient will work with CCM team and PCP to address needs related to optimized medication management of blood pressure . New 11/14/2019- Over the next 60 days the patient  will work with care management team and PCP to better manage HTN as evidenced by no ED visits  CCM SW Interventions Completed 11/14/2019 . Chart reviewed to note patient seen in ED on 11/13/2019 due to reported high blood pressure of 200/104 during rheumatology visit . Successful outbound call placed to the patient to assist with acute care coordination needs . Reviewed discharge instructions to add  Amlodipine to daily medication regimen . Determined the patient has yet to begin medication but acknowledges the pharmacy is to deliver medication "today" . Encouraged the patient to contact primary care team as needed . Advised the patient to expect a call from [facility] Manager over the next week . Collaboration with RN Care Manager regarding recent ED visit and current disposition  Patient Self Care Activities:  . Unable to independently take medications and blood pressure readings as prescribed . Unable to self administer medications as prescribed  Please see past updates related to this goal by clicking on the "Past Updates" button in the selected goal          Follow Up Plan: Patient scheduled to be contacted by RN Care Manager 11/21/19   Bevelyn Ngo, BSW, CDP Social Worker, Certified Dementia Practitioner [facility] Care Management [phone]  Total time spent performing care coordination and/or care management activities with the patient by phone or face to face = 10 minutes.

## 2019-11-14 NOTE — Patient Instructions (Signed)
Social Worker Visit Information  Goals we discussed today:  Goals Addressed            This Visit's Progress   . I would like to manage my mother's blood pressure       Current Barriers:  [name] Knowledge Deficits related to disease process and Self Health management of HTN . Chronic Disease Management support and education needs related to Hypertension, Rheumatoid Arthritis, Vitamin D deficiency, Acquired Hypothyroidism, Memory Loss  . Knowledge Deficits related to disease state management of blood pressure and Non Adherence to prescribed medication regimen . Latest blood pressure reading 126/80 at last PCP visit (was 162/100 at previous [place]) . Scr 0.71 to 1.49 . Current anti-hypertensives: lisinopril . Per PCP notes in 05/2019: o This is a chronic problem. The current episode started more than 1 year ago. The problem is unchanged. The problem is uncontrolled. Pertinent negatives include no anxiety or headaches. Agents associated with hypertension include thyroid hormones. Risk factors for coronary artery disease include sedentary lifestyle. Past treatments include ACE inhibitors. There are no compliance problems.  There is no history of angina. Identifiable causes of hypertension include a thyroid problem.   Pharmacist Clinical Goal(s):  [name] Over the next 90 days, patient will work with CCM team and PCP to address needs related to optimized medication management of blood pressure . New 11/14/2019- Over the next 60 days the patient will work with care management team and PCP to better manage HTN as evidenced by no ED visits  Interventions: . Comprehensive medication review performed. . Pill packs to aid in compliance.  Daughter to come in to town mid-December to check pill packaging compliance . Patient is not taking lisinopril as prescribed. She is unable to check BP independently, however has a caregiver that can assist with    . Follow Scr 0.71 to 1.49.  Discussed with PCP as patient is on  ACEi  CCM RN CM Interventions:  08/05/19 call completed with daughter [name] . Evaluation of current treatment plan related to Hypertension and patient's adherence to plan as established by provider. . Provided education to patient re: target BP for patient is 130/80 or less than; discussed importance of medication adherence and sodium restriction . Reviewed medications with patient and discussed patient adherence has improved since switching to the pill package system; patient's medication administration is supervised by CNA and son . Discussed plans with patient for ongoing care management follow up and provided patient with direct contact information for care management team . Advised patient, providing education and rationale, to monitor blood pressure daily and record, calling the CCM team and or PCP for findings outside established parameters.  . Provided patient with printed educational materials related to What is High Blood Pressure; Why Should I restrict Sodium; African Americans and High Blood Pressure; Life's Simple 7   CCM SW Interventions Completed 11/14/2019 . Chart reviewed to note patient seen in ED on 11/13/2019 due to reported high blood pressure of 200/104 during rheumatology visit . Successful outbound call placed to the patient to assist with acute care coordination needs . Reviewed discharge instructions to add Amlodipine to daily medication regimen . Determined the patient has yet to begin medication but acknowledges the pharmacy is to deliver medication "today" . Encouraged the patient to contact primary care team as needed . Advised the patient to expect a call from [place] over the next week . Collaboration with RN Care Manager regarding recent ED visit and current disposition  Patient  Self Care Activities:  . Unable to independently take medications and blood pressure readings as prescribed . Unable to self administer medications as prescribed  Please see  past updates related to this goal by clicking on the "Past Updates" button in the selected goal          Follow Up Plan: Patient will be contacted by RN Care Manager on 11/21/19   Bevelyn Ngo, BSW, CDP Social Worker, Certified Dementia Practitioner [facility] Care Management [phone]

## 2019-11-14 NOTE — Progress Notes (Signed)
CBC stable. CMP WNL.

## 2019-11-15 NOTE — ED Provider Notes (Signed)
Jordan [name]   CSN: [id number] Arrival date & time: 11/13/19  1107     History Chief Complaint  Patient presents with  . Hypertension  . Weakness    Jordan [name] is a 86 y.o. female.  HPI   86 year old female sent for evaluation of hypertension and generalized weakness.  Ongoing for several days.  She has not noticed any appreciable exacerbating relieving factors.  Blood pressures been running very high.  No acute pain.  Has been feeling mildly short of breath though.  No acute swelling.  No fevers or chills.  Past Medical History:  Diagnosis Date  . Allergy   . Anxiety    per pt's daughter  . Arthritis   . Asthma    pt reported  . Depression   . GERD (gastroesophageal reflux disease)   . Hearing deficit L  . High cholesterol   . Hypertension   . Osteoporosis   . Rheumatoid arthritis ([place])   . Stroke [facility])     Patient Active Problem List   Diagnosis Date Noted  . Alzheimer's disease ([place]) 08/13/2019  . Essential hypertension 08/13/2018  . Acquired hypothyroidism 08/13/2018  . Rash and nonspecific skin eruption 08/13/2018  . Memory loss 04/12/2017  . Depression 04/12/2017  . Rheumatoid arthritis involving multiple sites with positive rheumatoid factor ([place]) 08/17/2016  . High risk medication use 08/17/2016  . H/O total knee replacement, bilateral 08/17/2016  . Spondylosis of lumbar region without myelopathy or radiculopathy 08/17/2016  . History of glaucoma/ patient is legally blind 08/17/2016  . History of gastroesophageal reflux (GERD) 08/17/2016  . History of diverticulosis 08/17/2016  . History of hypothyroidism 08/17/2016  . history of asthmatic bronchitis 08/17/2016  . History of memory loss 08/17/2016  . History of atherosclerosis 08/17/2016    Past Surgical History:  Procedure Laterality Date  . ABDOMINAL HYSTERECTOMY    . KNEE ARTHROPLASTY    . REPLACEMENT TOTAL KNEE BILATERAL Bilateral     . WRIST SURGERY Left      OB History   No obstetric history on file.     No family history on file.  Social History   Tobacco Use  . Smoking status: Former Smoker    Packs/day: 0.25    Years: 13.00    Pack years: 3.25    Types: Cigarettes    Quit date: 08/18/2006    Years since quitting: 13.2  . Smokeless tobacco: Never Used  Substance Use Topics  . Alcohol use: No  . Drug use: Yes    Types: Hydrocodone    Home Medications Prior to Admission medications   Medication Sig Start Date End Date Taking? Authorizing Provider  albuterol (VENTOLIN HFA) 108 (90 Base) MCG/ACT inhaler Inhale 1 puff into the lungs every 6 (six) hours as needed for wheezing or shortness of breath. 06/03/19   [name], FNP  ALLERGY RELIEF 10 MG tablet TAKE 1 TABLET BY MOUTH EVERY DAY 10/30/19   [name], FNP  amLODipine (NORVASC) 5 MG tablet Take 1 tablet (5 mg total) by mouth daily. 11/13/19   Virgel Manifold, MD  atropine 1 % ophthalmic solution INSTILL 1 GTT INTO OS D 10/19/17   [provider]  brimonidine-timolol (COMBIGAN) 0.2-0.5 % ophthalmic solution Place 1 drop into both eyes every 12 (twelve) hours.    [provider]  ENBREL SURECLICK 50 MG/ML injection Inject 0.98 mLs (50 mg total) into the skin once a week. 09/18/19   Bo Merino, MD  fluticasone (FLONASE) 50 MCG/ACT nasal spray INSTILL 2 SPRAYS IN EACH NOSTRIL EVERY DAY 06/12/18   Arnette Felts, FNP  folic acid (FOLVITE) 1 MG tablet TAKE 1 TABLET(1 MG) BY MOUTH DAILY 07/12/18   Pollyann Savoy, MD  latanoprost (XALATAN) 0.005 % ophthalmic solution Place 1 drop into both eyes nightly. 10/24/16   [provider]  levothyroxine (SYNTHROID) 100 MCG tablet Take 1 tablet (100 mcg total) by mouth daily. 06/03/19   Arnette Felts, FNP  lisinopril (ZESTRIL) 30 MG tablet TAKE 1 TABLET BY MOUTH EVERY DAY 10/30/19   Arnette Felts, FNP  [facility] 28-10 MG CP24 TAKE 1 CAPSULE BY MOUTH EVERY DAY 10/30/19   Arnette Felts, FNP   omeprazole (PRILOSEC) 20 MG capsule TAKE 1 CAPSULE BY MOUTH EVERY DAY 07/15/19   Arnette Felts, FNP  simvastatin (ZOCOR) 40 MG tablet TAKE 1 TABLET BY MOUTH AT BEDTIME 10/30/19   Arnette Felts, FNP  SYNTHROID 100 MCG tablet TAKE 1 TABLET BY MOUTH EVERY DAY BEFORE BREAKFAST 10/30/19   Arnette Felts, FNP    Allergies    Penicillins  Review of Systems   Review of Systems All systems reviewed and negative, other than as noted in HPI.  Physical Exam Updated Vital Signs BP (!) 209/84   Pulse 70   Temp 98.3 F (36.8 C) (Oral)   Resp (!) 24   Ht 5\' 1"  (1.549 m)   SpO2 99%   BMI 18.67 kg/m   Physical Exam Vitals and nursing [name] reviewed.  Constitutional:      General: She is not in acute distress.    Appearance: She is well-developed.  HENT:     Head: Normocephalic and atraumatic.  Eyes:     General:        Right eye: No discharge.        Left eye: No discharge.     Conjunctiva/sclera: Conjunctivae normal.  Cardiovascular:     Rate and Rhythm: Normal rate and regular rhythm.     Heart sounds: Normal heart sounds. No murmur. No friction rub. No gallop.   Pulmonary:     Effort: Pulmonary effort is normal. No respiratory distress.     Breath sounds: Normal breath sounds.  Abdominal:     General: There is no distension.     Palpations: Abdomen is soft.     Tenderness: There is no abdominal tenderness.  Musculoskeletal:        General: No tenderness.     Cervical back: Neck supple.  Skin:    General: Skin is warm and dry.  Neurological:     Mental Status: She is alert.  Psychiatric:        Behavior: Behavior normal.        Thought Content: Thought content normal.     ED Results / Procedures / Treatments   Labs (all labs ordered are listed, but only abnormal results are displayed) Labs Reviewed  CBC - Abnormal; Notable for the following components:      Result Value   RBC 5.44 (*)    MCV 72.6 (*)    MCH 22.6 (*)    RDW 15.9 (*)    All other components within  normal limits  URINALYSIS, ROUTINE W REFLEX MICROSCOPIC - Abnormal; Notable for the following components:   Color, Urine STRAW (*)    All other components within normal limits  BASIC METABOLIC PANEL    EKG EKG Interpretation  Date/Time:  Wednesday November 13 2019 11:18:11 EDT Ventricular Rate:  86 PR Interval:  114 QRS Duration: 136 QT Interval:  394 QTC Calculation: 471 R Axis:   59 Text Interpretation: Normal sinus rhythm Non-specific intra-ventricular conduction block Cannot rule out Septal infarct , age undetermined Abnormal ECG Interpretation limited secondary to artifact Confirmed by Raeford Razor [phone]) on 11/13/2019 12:52:37 PM   Radiology No results found.  Procedures Procedures (including critical care time)  Medications Ordered in ED Medications - No data to display  ED Course  I have reviewed the triage vital signs and the nursing notes.  Pertinent labs & imaging results that were available during my care of the patient were reviewed by me and considered in my medical decision making (see chart for details).    MDM Rules/Calculators/A&P                      86 year old female with hypertension.  Pretty minimal complaints otherwise.  Exam is nonfocal.  We will put her on Norvasc.  Needs to keep a log of blood pressure.  Follow-up in about a week or 2 for recheck.  Emergent return cautions discussed with patient and son at bedside. Final Clinical Impression(s) / ED Diagnoses Final diagnoses:  Hypertension, unspecified type    Rx / DC Orders ED Discharge Orders         Ordered    amLODipine (NORVASC) 5 MG tablet  Daily     11/13/19 1445           Raeford Razor, MD 11/15/19 1117

## 2019-11-19 ENCOUNTER — Telehealth: Payer: Self-pay | Admitting: Rheumatology

## 2019-11-19 MED ORDER — ENBREL SURECLICK 50 MG/ML ~~LOC~~ SOAJ: 50.0000 mg | SUBCUTANEOUS | 0 refills | Status: DC

## 2019-11-19 NOTE — Telephone Encounter (Signed)
Patient's daughter [name] called requesting prescription refill of Enbrel to be sent to [facility].  [name] states she is out of injections and due to take her next one on Sunday, 11/24/19.

## 2019-11-19 NOTE — Telephone Encounter (Signed)
Last Visit: 11/13/2019 Next Visit: message sent to the front desk to schedule, due 03/2020.  Labs: 11/13/2019 CBC stable. CMP WNL.  TB Gold: 06/14/2019 negative   Okay to refill per Dr. Corliss Skains.   Advised patient that refill has been sent to the  Pharmacy requested.

## 2019-11-20 ENCOUNTER — Telehealth: Payer: Self-pay | Admitting: Rheumatology

## 2019-11-20 NOTE — Telephone Encounter (Signed)
Called patient to schedule her follow-up appointment due in September.  Patient states [name] schedules her appointments and requested I call back on Friday afternoon.

## 2019-11-20 NOTE — Telephone Encounter (Signed)
-----   Message from Ellen Henri, CMA sent at 11/19/2019 10:29 AM EDT ----- Please call patient to schedule follow up, patient is due 03/2020. Thanks!

## 2019-11-21 ENCOUNTER — Telehealth: Payer: Self-pay

## 2019-11-21 ENCOUNTER — Ambulatory Visit: Payer: Self-pay

## 2019-11-21 ENCOUNTER — Other Ambulatory Visit: Payer: Self-pay

## 2019-11-21 DIAGNOSIS — E559 Vitamin D deficiency, unspecified: Secondary | ICD-10-CM

## 2019-11-21 DIAGNOSIS — F0280 Dementia in other diseases classified elsewhere without behavioral disturbance: Secondary | ICD-10-CM

## 2019-11-21 DIAGNOSIS — G309 Alzheimer's disease, unspecified: Secondary | ICD-10-CM

## 2019-11-21 DIAGNOSIS — M0579 Rheumatoid arthritis with rheumatoid factor of multiple sites without organ or systems involvement: Secondary | ICD-10-CM

## 2019-11-21 DIAGNOSIS — I10 Essential (primary) hypertension: Secondary | ICD-10-CM

## 2019-11-21 DIAGNOSIS — E039 Hypothyroidism, unspecified: Secondary | ICD-10-CM

## 2019-11-21 NOTE — Chronic Care Management (AMB) (Signed)
  Chronic Care Management   Outreach Note  11/21/2019 Name: Jordan [name] MRN: [id number] DOB: 1933/08/17  Referred by: Arnette Felts, FNP Reason for referral : Care Coordination   Collaboration received from patients primary provider reporting concerns surrounding transportation and financial resource needs reported by home health agency. SW placed an unsuccessful outbound call to the patient to assist with care coordination needs. SW left a HIPAA compliant voice message requesting a return call.  Follow Up Plan: SW will follow up with the patient over the next week.  Bevelyn Ngo, BSW, CDP Social Worker, Certified Dementia Practitioner [facility] Care Management [phone]

## 2019-11-22 NOTE — Chronic Care Management (AMB) (Signed)
  Chronic Care Management   Outreach Note  11/22/2019 Name: Jordan [name] MRN: [id number] DOB: 10/10/1933  Referred by: Arnette Felts, FNP Reason for referral : Chronic Care Management ([facility] CM RN post d/c Call )   An unsuccessful telephone outreach was attempted today. The patient was referred to the case management team for assistance with care management and care coordination.   Follow Up Plan: A HIPPA compliant phone message was left for the patient providing contact information and requesting a return call.  Telephone follow up appointment with care management team member scheduled for: 12/11/19  Delsa Sale, RN, BSN, CCM Care Management Coordinator [facility] Care Management/[facility]  Direct Phone: [phone]

## 2019-11-25 DIAGNOSIS — Z049 Encounter for examination and observation for unspecified reason: Secondary | ICD-10-CM | POA: Diagnosis not present

## 2019-11-26 ENCOUNTER — Ambulatory Visit (INDEPENDENT_AMBULATORY_CARE_PROVIDER_SITE_OTHER): Payer: Medicare Other

## 2019-11-26 DIAGNOSIS — M0579 Rheumatoid arthritis with rheumatoid factor of multiple sites without organ or systems involvement: Secondary | ICD-10-CM

## 2019-11-26 DIAGNOSIS — I10 Essential (primary) hypertension: Secondary | ICD-10-CM

## 2019-11-26 DIAGNOSIS — F0280 Dementia in other diseases classified elsewhere without behavioral disturbance: Secondary | ICD-10-CM

## 2019-11-26 DIAGNOSIS — E039 Hypothyroidism, unspecified: Secondary | ICD-10-CM | POA: Diagnosis not present

## 2019-11-26 DIAGNOSIS — G309 Alzheimer's disease, unspecified: Secondary | ICD-10-CM | POA: Diagnosis not present

## 2019-11-26 NOTE — Chronic Care Management (AMB) (Signed)
Chronic Care Management    Social Work Follow Up Note  11/26/2019 Name: Jordan [name] MRN: [id number] DOB: 09/08/1933  Jordan [name] is a 86 y.o. year old female who is a primary care patient of Jordan [name], Jordan [name]. The CCM team was consulted for assistance with care coordination.   Review of patient status, including review of consultants reports, other relevant assessments, and collaboration with appropriate care team members and the patient's provider was performed as part of comprehensive patient evaluation and provision of chronic care management services.    SDOH (Social Determinants of Health) assessments performed: No    Outpatient Encounter Medications as of 11/26/2019  Medication Sig  . albuterol (VENTOLIN HFA) 108 (90 Base) MCG/ACT inhaler Inhale 1 puff into the lungs every 6 (six) hours as needed for wheezing or shortness of breath.  . ALLERGY RELIEF 10 MG tablet TAKE 1 TABLET BY MOUTH EVERY DAY  . amLODipine (NORVASC) 5 MG tablet Take 1 tablet (5 mg total) by mouth daily.  [name] atropine 1 % ophthalmic solution INSTILL 1 GTT INTO OS D  . brimonidine-timolol (COMBIGAN) 0.2-0.5 % ophthalmic solution Place 1 drop into both eyes every 12 (twelve) hours.  [name] etanercept (ENBREL SURECLICK) 50 MG/ML injection Inject 0.98 mLs (50 mg total) into the skin once a week.  . fluticasone (FLONASE) 50 MCG/ACT nasal spray INSTILL 2 SPRAYS IN EACH NOSTRIL EVERY DAY  . folic acid (FOLVITE) 1 MG tablet TAKE 1 TABLET(1 MG) BY MOUTH DAILY  . latanoprost (XALATAN) 0.005 % ophthalmic solution Place 1 drop into both eyes nightly.  . levothyroxine (SYNTHROID) 100 MCG tablet Take 1 tablet (100 mcg total) by mouth daily.  [name] lisinopril (ZESTRIL) 30 MG tablet TAKE 1 TABLET BY MOUTH EVERY DAY  . NAMZARIC 28-10 MG CP24 TAKE 1 CAPSULE BY MOUTH EVERY DAY  . omeprazole (PRILOSEC) 20 MG capsule TAKE 1 CAPSULE BY MOUTH EVERY DAY  . simvastatin (ZOCOR) 40 MG tablet TAKE 1 TABLET BY MOUTH AT BEDTIME  . SYNTHROID 100  MCG tablet TAKE 1 TABLET BY MOUTH EVERY DAY BEFORE BREAKFAST   No facility-administered encounter medications on file as of 11/26/2019.     Goals Addressed            This Visit's Progress   . Collaborate with RN Care Manager to perform appropraite assessments to identify and assist with care coordination needs       CARE PLAN ENTRY (see longitudinal plan of care for additional care plan information)  Current Barriers:  . Financial constraints related to utility costs . Limited knowledge of health plan benefits specific to transportation . Limited education about resources to assist with care coordination needs . Ongoing care coordination needs as a result of chronic conditions including HTN, Rheumatoid Arthritis, and memory loss  Social Work Clinical Goal(s):  [name] Over the next 20 days the patient and her daughter will work with SW to gain a better understanding of health plan benefits . Over the next 30 days, the patient and her daughter will work with SW to identify community resources to assist with utility costs  CCM SW Interventions: Completed 11/26/2019 with Jordan [name] . Inter-disciplinary care team collaboration (see longitudinal plan of care) . Collaboration with patients primary provider whom reports communication from home health indicating the patient is in needs of transportation resources as well as assistance with utility costs . Successful outbound call placed to the patients daughter, Jordan [name] to discuss patient care coordination needs . Transportation o Determined Jordan [name] is  interested in transportation resources to assist if/when the family is not available o Provided education to Jordan [name] regarding the patients transportation benefit under her health plan o Advised Jordan [name] to contact the patients health plan a minimum of 3 days in advance to secure transportation . Utility Costs o Informed by Jordan [name] the patient received financial assistance  in the past but has yet to receive over the last two years o Provided education on the LIEAP (Low Income Energy Assistance Program) offered by DSS o Determined Jordan [name] feels this is the program the patient utilized in the past and reports "but we do not know why it stopped" o Advised Mrs Kyung [name] this program requires a new application each year in December o Outbound call placed to [name] DSS to determined if funds remain available to assist with utility costs at this time - Voice message left requesting a return call - Communication with Jordan [name] advising SW would outreach once contact is established with a team member from DSS o Confirmed the patient does not have an active disconnect notice  Patient Self Care Activities:  . Attends all scheduled provider appointments . Calls pharmacy for medication refills . Calls provider office for new concerns or questions . Supportive family to assist with patient care needs  Initial goal documentation         Follow Up Plan: SW will follow up with patient by phone over the next week   Jordan [name], BSW, CDP Social Worker, Certified Dementia Practitioner Jordan [name] / Jordan [name] Care Management [phone]  Total time spent performing care coordination and/or care management activities with the patient by phone or face to face = 20 minutes.

## 2019-11-26 NOTE — Patient Instructions (Signed)
Social Worker Visit Information  Goals we discussed today:  Goals Addressed            This Visit's Progress   . Collaborate with RN Care Manager to perform appropraite assessments to identify and assist with care coordination needs       CARE PLAN ENTRY (see longitudinal plan of care for additional care plan information)  Current Barriers:  . Financial constraints related to utility costs . Limited knowledge of health plan benefits specific to transportation . Limited education about resources to assist with care coordination needs . Ongoing care coordination needs as a result of chronic conditions including HTN, Rheumatoid Arthritis, and memory loss  Social Work Clinical Goal(s):  [name] Over the next 20 days the patient and her daughter will work with SW to gain a better understanding of health plan benefits . Over the next 30 days, the patient and her daughter will work with SW to identify community resources to assist with utility costs  CCM SW Interventions: Completed 11/26/2019 with [name] . Inter-disciplinary care team collaboration (see longitudinal plan of care) . Collaboration with patients primary provider whom reports communication from home health indicating the patient is in needs of transportation resources as well as assistance with utility costs . Successful outbound call placed to the patients daughter, [name] to discuss patient care coordination needs . Transportation o Determined [name] is interested in transportation resources to assist if/when the family is not available o Provided education to [name] regarding the patients transportation benefit under her health plan o Advised [name] to contact the patients health plan a minimum of 3 days in advance to secure transportation . Utility Costs o Informed by [name] the patient received financial assistance in the past but has yet to receive over the last two years o Provided  education on the LIEAP (Low Income Energy Assistance Program) offered by DSS o Determined [name] feels this is the program the patient utilized in the past and reports "but we do not know why it stopped" o Advised Mrs [name] this program requires a new application each year in December o Outbound call placed to [facility] DSS to determined if funds remain available to assist with utility costs at this time - Voice message left requesting a return call - Communication with [name] advising SW would outreach once contact is established with a team member from DSS o Confirmed the patient does not have an active disconnect notice  Patient Self Care Activities:  . Attends all scheduled provider appointments . Calls pharmacy for medication refills . Calls provider office for new concerns or questions . Supportive family to assist with patient care needs  Initial goal documentation         Materials Provided: Verbal education about community resources provided by phone  Follow Up Plan: SW will follow up with patient by phone over the next week   Bevelyn Ngo, BSW, CDP Social Worker, Certified Dementia Practitioner [facility] Care Management [phone]

## 2019-11-27 ENCOUNTER — Ambulatory Visit: Payer: Self-pay

## 2019-11-27 ENCOUNTER — Other Ambulatory Visit: Payer: Self-pay | Admitting: Nurse Practitioner

## 2019-11-27 DIAGNOSIS — G309 Alzheimer's disease, unspecified: Secondary | ICD-10-CM

## 2019-11-27 DIAGNOSIS — F0280 Dementia in other diseases classified elsewhere without behavioral disturbance: Secondary | ICD-10-CM

## 2019-11-27 DIAGNOSIS — M0579 Rheumatoid arthritis with rheumatoid factor of multiple sites without organ or systems involvement: Secondary | ICD-10-CM

## 2019-11-27 DIAGNOSIS — I10 Essential (primary) hypertension: Secondary | ICD-10-CM

## 2019-11-27 NOTE — Chronic Care Management (AMB) (Signed)
Chronic Care Management    Social Work Follow Up Note  11/27/2019 Name: Bowen Goyal MRN: [id number] DOB: Mar 25, 1934  Wilhelmine Krogstad is a 86 y.o. year old female who is a primary care patient of Arnette Felts, FNP. The CCM team was consulted for assistance with care coordination.   Review of patient status, including review of consultants reports, other relevant assessments, and collaboration with appropriate care team members and the patient's provider was performed as part of comprehensive patient evaluation and provision of chronic care management services.    SDOH (Social Determinants of Health) assessments performed: No    Outpatient Encounter Medications as of 11/27/2019  Medication Sig  . albuterol (VENTOLIN HFA) 108 (90 Base) MCG/ACT inhaler Inhale 1 puff into the lungs every 6 (six) hours as needed for wheezing or shortness of breath.  . ALLERGY RELIEF 10 MG tablet TAKE 1 TABLET BY MOUTH EVERY DAY  . amLODipine (NORVASC) 5 MG tablet Take 1 tablet (5 mg total) by mouth daily.  [name] atropine 1 % ophthalmic solution INSTILL 1 GTT INTO OS D  . brimonidine-timolol (COMBIGAN) 0.2-0.5 % ophthalmic solution Place 1 drop into both eyes every 12 (twelve) hours.  [name] etanercept (ENBREL SURECLICK) 50 MG/ML injection Inject 0.98 mLs (50 mg total) into the skin once a week.  . fluticasone (FLONASE) 50 MCG/ACT nasal spray INSTILL 2 SPRAYS IN EACH NOSTRIL EVERY DAY  . folic acid (FOLVITE) 1 MG tablet TAKE 1 TABLET(1 MG) BY MOUTH DAILY  . latanoprost (XALATAN) 0.005 % ophthalmic solution Place 1 drop into both eyes nightly.  . levothyroxine (SYNTHROID) 100 MCG tablet Take 1 tablet (100 mcg total) by mouth daily.  [name] lisinopril (ZESTRIL) 30 MG tablet TAKE 1 TABLET BY MOUTH EVERY DAY  . NAMZARIC 28-10 MG CP24 TAKE 1 CAPSULE BY MOUTH EVERY DAY  . omeprazole (PRILOSEC) 20 MG capsule TAKE 1 CAPSULE BY MOUTH EVERY DAY  . simvastatin (ZOCOR) 40 MG tablet TAKE 1 TABLET BY MOUTH AT BEDTIME  . SYNTHROID 100  MCG tablet TAKE 1 TABLET BY MOUTH EVERY DAY BEFORE BREAKFAST   No facility-administered encounter medications on file as of 11/27/2019.     Goals Addressed            This Visit's Progress   . Collaborate with RN Care Manager to perform appropraite assessments to identify and assist with care coordination needs       CARE PLAN ENTRY (see longitudinal plan of care for additional care plan information)  Current Barriers:  . Financial constraints related to utility costs . Limited knowledge of health plan benefits specific to transportation . Limited education about resources to assist with care coordination needs . Ongoing care coordination needs as a result of chronic conditions including HTN, Rheumatoid Arthritis, and memory loss  Social Work Clinical Goal(s):  [name] Over the next 20 days the patient and her daughter will work with SW to gain a better understanding of health plan benefits . Over the next 30 days, the patient and her daughter will work with SW to identify community resources to assist with utility costs  CCM SW Interventions: Completed 11/27/2019 . Successful outbound call placed to [facility] DSS to confirm window for LIEAP application closed March 31 o Determined the patient is eligible to apply beginning December 1 either by phone or online application . Outbound call placed to the patients daughter, [name] who reports she is unavailable to complete the call at this time but will contact SW later today . Scheduled  follow up call over the next week in the event SW does not receive a return call  Completed 11/26/2019  . Inter-disciplinary care team collaboration (see longitudinal plan of care) . Collaboration with patients primary provider whom reports communication from home health indicating the patient is in needs of transportation resources as well as assistance with utility costs . Successful outbound call placed to the patients daughter, [name] to  discuss patient care coordination needs . Transportation o Determined [name] is interested in transportation resources to assist if/when the family is not available o Provided education to [name] regarding the patients transportation benefit under her health plan o Advised [name] to contact the patients health plan a minimum of 3 days in advance to secure transportation . Utility Costs o Informed by [name] the patient received financial assistance in the past but has yet to receive over the last two years o Provided education on the [place] ([place] Program) offered by DSS o Determined [name] feels this is the program the patient utilized in the past and reports "but we do not know why it stopped" o Advised [name] this program requires a new application each year in December o Outbound call placed to [place] to determined if funds remain available to assist with utility costs at this time - Voice message left requesting a return call - Communication with [name] advising SW would outreach once contact is established with a team member from [place] o Confirmed the patient does not have an active disconnect notice  Patient Self Care Activities:  . Attends all scheduled provider appointments . Calls pharmacy for medication refills . Calls provider office for new concerns or questions . Supportive family to assist with patient care needs  Please see past updates related to this goal by clicking on the "Past Updates" button in the selected goal          Follow Up Plan: SW will follow up with patient by phone over the next week.   Daneen Schick, BSW, CDP Social Worker, Certified Dementia Practitioner [place] [facility] [place] [facility] [phone]  Total time spent performing care coordination and/or care management activities with the patient by phone or face to face = 15 minutes.

## 2019-11-27 NOTE — Patient Instructions (Signed)
Social Worker Visit Information  Goals we discussed today:  Goals Addressed            This Visit's Progress   . Collaborate with RN Care Manager to perform appropraite assessments to identify and assist with care coordination needs       CARE PLAN ENTRY (see longitudinal plan of care for additional care plan information)  Current Barriers:  . Financial constraints related to utility costs . Limited knowledge of health plan benefits specific to transportation . Limited education about resources to assist with care coordination needs . Ongoing care coordination needs as a result of chronic conditions including HTN, Rheumatoid Arthritis, and memory loss  Social Work Clinical Goal(s):  [name] Over the next 20 days the patient and her daughter will work with SW to gain a better understanding of health plan benefits . Over the next 30 days, the patient and her daughter will work with SW to identify community resources to assist with utility costs  CCM SW Interventions: Completed 11/27/2019 . Successful outbound call placed to [facility] DSS to confirm window for LIEAP application closed March 31 o Determined the patient is eligible to apply beginning December 1 either by phone or online application . Outbound call placed to the patients daughter, [name] who reports she is unavailable to complete the call at this time but will contact SW later today . Scheduled follow up call over the next week in the event SW does not receive a return call  Completed 11/26/2019  . Inter-disciplinary care team collaboration (see longitudinal plan of care) . Collaboration with patients primary provider whom reports communication from home health indicating the patient is in needs of transportation resources as well as assistance with utility costs . Successful outbound call placed to the patients daughter, [name] to discuss patient care coordination needs . Transportation o Determined Mrs.  [name] is interested in transportation resources to assist if/when the family is not available o Provided education to [name] regarding the patients transportation benefit under her health plan o Advised [name] to contact the patients health plan a minimum of 3 days in advance to secure transportation . Utility Costs o Informed by [name] the patient received financial assistance in the past but has yet to receive over the last two years o Provided education on the LIEAP ([employer] Assistance Program) offered by DSS o Determined [name] feels this is the program the patient utilized in the past and reports "but we do not know why it stopped" o Advised Mrs [name] this program requires a new application each year in December o Outbound call placed to [facility] DSS to determined if funds remain available to assist with utility costs at this time - Voice message left requesting a return call - Communication with [name] advising SW would outreach once contact is established with a team member from DSS o Confirmed the patient does not have an active disconnect notice  Patient Self Care Activities:  . Attends all scheduled provider appointments . Calls pharmacy for medication refills . Calls provider office for new concerns or questions . Supportive family to assist with patient care needs  Please see past updates related to this goal by clicking on the "Past Updates" button in the selected goal          Follow Up Plan: SW will follow up with patient by phone over the next week.   Bevelyn Ngo, BSW, CDP Social Worker, Certified Dementia  Practitioner [place] [facility] Care Management [phone]

## 2019-11-28 ENCOUNTER — Ambulatory Visit: Payer: Self-pay

## 2019-11-28 DIAGNOSIS — I10 Essential (primary) hypertension: Secondary | ICD-10-CM

## 2019-11-28 DIAGNOSIS — F0280 Dementia in other diseases classified elsewhere without behavioral disturbance: Secondary | ICD-10-CM

## 2019-11-28 DIAGNOSIS — G309 Alzheimer's disease, unspecified: Secondary | ICD-10-CM

## 2019-11-28 NOTE — Chronic Care Management (AMB) (Signed)
Chronic Care Management    Social Work Follow Up Note  11/28/2019 Name: Jordan [name] MRN: [id number] DOB: 18-Aug-1933  Jordan [name] is a 86 y.o. year old female who is a primary care patient of [name], [place]. The CCM team was consulted for assistance with care coordination.   Review of patient status, including review of consultants reports, other relevant assessments, and collaboration with appropriate care team members and the patient's provider was performed as part of comprehensive patient evaluation and provision of chronic care management services.    SDOH (Social Determinants of Health) assessments performed: No    Outpatient Encounter Medications as of 11/28/2019  Medication Sig  . albuterol (VENTOLIN HFA) 108 (90 Base) MCG/ACT inhaler Inhale 1 puff into the lungs every 6 (six) hours as needed for wheezing or shortness of breath.  . ALLERGY RELIEF 10 MG tablet TAKE 1 TABLET BY MOUTH EVERY DAY  . amLODipine (NORVASC) 5 MG tablet Take 1 tablet (5 mg total) by mouth daily.  [name] atropine 1 % ophthalmic solution INSTILL 1 GTT INTO OS D  . brimonidine-timolol (COMBIGAN) 0.2-0.5 % ophthalmic solution Place 1 drop into both eyes every 12 (twelve) hours.  [name] etanercept (ENBREL SURECLICK) 50 MG/ML injection Inject 0.98 mLs (50 mg total) into the skin once a week.  . fluticasone (FLONASE) 50 MCG/ACT nasal spray INSTILL 2 SPRAYS IN EACH NOSTRIL EVERY DAY  . folic acid (FOLVITE) 1 MG tablet TAKE 1 TABLET(1 MG) BY MOUTH DAILY  . latanoprost (XALATAN) 0.005 % ophthalmic solution Place 1 drop into both eyes nightly.  . levothyroxine (SYNTHROID) 100 MCG tablet Take 1 tablet (100 mcg total) by mouth daily.  [name] lisinopril (ZESTRIL) 30 MG tablet TAKE 1 TABLET BY MOUTH EVERY DAY  . NAMZARIC 28-10 MG CP24 TAKE 1 CAPSULE BY MOUTH EVERY DAY  . omeprazole (PRILOSEC) 20 MG capsule TAKE 1 CAPSULE BY MOUTH EVERY DAY  . simvastatin (ZOCOR) 40 MG tablet TAKE 1 TABLET BY MOUTH AT BEDTIME  . SYNTHROID 100  MCG tablet TAKE 1 TABLET BY MOUTH EVERY DAY BEFORE BREAKFAST   No facility-administered encounter medications on file as of 11/28/2019.     Goals Addressed            This Visit's Progress   . COMPLETED: Collaborate with RN Care Manager to perform appropraite assessments to identify and assist with care coordination needs       CARE PLAN ENTRY (see longitudinal plan of care for additional care plan information)  Current Barriers:  . Financial constraints related to utility costs . Limited knowledge of health plan benefits specific to transportation . Limited education about resources to assist with care coordination needs . Ongoing care coordination needs as a result of chronic conditions including HTN, Rheumatoid Arthritis, and memory loss  Social Work Clinical Goal(s):  [name] Over the next 20 days the patient and her daughter will work with SW to gain a better understanding of health plan benefits Completed . Over the next 30 days, the patient and her daughter will work with SW to identify community resources to assist with utility costs Completed  CCM SW Interventions: Completed 11/28/2019 with Jordan [name] . Successful outbound call placed to [employer] in response to a voice message received . Informed Jordan [name] the application window for the [place] ( [place] Program) closed on March 31 but the patient could apply beginning December 1 . Reviewed patients transportation benefits under her health plan and how to access services . Goal  Met  Patient Self Care Activities:  . Attends all scheduled provider appointments . Calls pharmacy for medication refills . Calls provider office for new concerns or questions . Supportive family to assist with patient care needs  Please see past updates related to this goal by clicking on the "Past Updates" button in the selected goal          Follow Up Plan: No SW follow up planned at this time. The patient will remain  active with RN Care Manager and is encouraged to contact SW as needed.   Daneen Schick, BSW, CDP Social Worker, Certified Dementia Practitioner [place] [facility] [place] [facility] [phone]  Total time spent performing care coordination and/or care management activities with the patient by phone or face to face = 8 minutes.

## 2019-11-28 NOTE — Patient Instructions (Signed)
Social Worker Visit Information  Goals we discussed today:  Goals Addressed            This Visit's Progress   . COMPLETED: Collaborate with RN Care Manager to perform appropraite assessments to identify and assist with care coordination needs       CARE PLAN ENTRY (see longitudinal plan of care for additional care plan information)  Current Barriers:  . Financial constraints related to utility costs . Limited knowledge of health plan benefits specific to transportation . Limited education about resources to assist with care coordination needs . Ongoing care coordination needs as a result of chronic conditions including HTN, Rheumatoid Arthritis, and memory loss  Social Work Clinical Goal(s):  [name] Over the next 20 days the patient and her daughter will work with SW to gain a better understanding of health plan benefits Completed . Over the next 30 days, the patient and her daughter will work with SW to identify community resources to assist with utility costs Completed  CCM SW Interventions: Completed 11/28/2019 with Jordan [name] . Successful outbound call placed to [employer] in response to a voice message received . Informed Jordan [name] the application window for the [place] ( [place] Program) closed on March 31 but the patient could apply beginning December 1 . Reviewed patients transportation benefits under her health plan and how to access services . Goal Met  Patient Self Care Activities:  . Attends all scheduled provider appointments . Calls pharmacy for medication refills . Calls provider office for new concerns or questions . Supportive family to assist with patient care needs  Please see past updates related to this goal by clicking on the "Past Updates" button in the selected goal          Materials Provided: Verbal education about community resources provided by phone  Follow Up Plan: No SW follow up planned at this time. Please contact me  with future resource needs.   Jordan [name], BSW, CDP Social Worker, Certified Dementia Practitioner [place] [facility] [place] [facility] [phone]

## 2019-11-29 ENCOUNTER — Telehealth: Payer: Self-pay

## 2019-12-02 ENCOUNTER — Other Ambulatory Visit: Payer: Self-pay | Admitting: Nurse Practitioner

## 2019-12-03 ENCOUNTER — Encounter: Payer: Self-pay | Admitting: Nurse Practitioner

## 2019-12-03 ENCOUNTER — Other Ambulatory Visit: Payer: Self-pay

## 2019-12-03 ENCOUNTER — Ambulatory Visit (INDEPENDENT_AMBULATORY_CARE_PROVIDER_SITE_OTHER): Payer: Medicare Other | Admitting: Nurse Practitioner

## 2019-12-03 ENCOUNTER — Other Ambulatory Visit: Payer: Self-pay | Admitting: Nurse Practitioner

## 2019-12-03 VITALS — BP 110/70 | HR 61 | Temp 98.4°F | Ht 59.2 in | Wt 101.2 lb

## 2019-12-03 DIAGNOSIS — F0280 Dementia in other diseases classified elsewhere without behavioral disturbance: Secondary | ICD-10-CM

## 2019-12-03 DIAGNOSIS — G309 Alzheimer's disease, unspecified: Secondary | ICD-10-CM

## 2019-12-03 DIAGNOSIS — I10 Essential (primary) hypertension: Secondary | ICD-10-CM | POA: Diagnosis not present

## 2019-12-03 DIAGNOSIS — E039 Hypothyroidism, unspecified: Secondary | ICD-10-CM

## 2019-12-03 DIAGNOSIS — E559 Vitamin D deficiency, unspecified: Secondary | ICD-10-CM | POA: Diagnosis not present

## 2019-12-03 MED ORDER — AMLODIPINE BESYLATE 5 MG PO TABS: 5.0000 mg | ORAL_TABLET | Freq: Every day | ORAL | 1 refills | Status: DC

## 2019-12-03 NOTE — Progress Notes (Signed)
This visit occurred during the SARS-CoV-2 public health emergency.  Safety protocols were in place, including screening questions prior to the visit, additional usage of staff PPE, and extensive cleaning of exam room while observing appropriate contact time as indicated for disinfecting solutions.  Subjective:     Patient ID: Jordan [name] , female    DOB: 11-02-1933 , 86 y.o.   MRN: [id number]   Chief Complaint  Patient presents with  . Hypertension  . Hypothyroidism    HPI  She is here today with her daughter [name].    Wt Readings from Last 3 Encounters: 12/03/19 : 101 lb 3.2 oz (45.9 kg) 11/13/19 : 98 lb 12.8 oz (44.8 kg) 06/24/19 : 104 lb 12.8 oz (47.5 kg)   Hypertension This is a chronic problem. The current episode started more than 1 year ago. The problem is unchanged. The problem is controlled. Pertinent negatives include no anxiety, chest pain, headaches, palpitations or shortness of breath. There are no associated agents to hypertension. Risk factors for coronary artery disease include sedentary lifestyle. Past treatments include calcium channel blockers and diuretics. There are no compliance problems.  There is no history of angina. There is no history of chronic renal disease.     Past Medical History:  Diagnosis Date  . Allergy   . Anxiety    per pt's daughter  . Arthritis   . Asthma    pt reported  . Depression   . GERD (gastroesophageal reflux disease)   . Hearing deficit L  . High cholesterol   . Hypertension   . Osteoporosis   . Rheumatoid arthritis ([place])   . Stroke [facility])      No family history on file.   Current Outpatient Medications:  .  ALLERGY RELIEF 10 MG tablet, TAKE 1 TABLET BY MOUTH EVERY DAY, Disp: 90 tablet, Rfl: 0 .  amLODipine (NORVASC) 5 MG tablet, Take 1 tablet (5 mg total) by mouth daily., Disp: 30 tablet, Rfl: 0 .  brimonidine-timolol (COMBIGAN) 0.2-0.5 % ophthalmic solution, Place 1 drop into both eyes every 12 (twelve)  hours., Disp: , Rfl:  .  etanercept (ENBREL SURECLICK) 50 MG/ML injection, Inject 0.98 mLs (50 mg total) into the skin once a week., Disp: 12 pen, Rfl: 0 .  fluticasone (FLONASE) 50 MCG/ACT nasal spray, INSTILL 2 SPRAYS IN EACH NOSTRIL EVERY DAY, Disp: 16 g, Rfl: 1 .  folic acid (FOLVITE) 1 MG tablet, TAKE 1 TABLET(1 MG) BY MOUTH DAILY, Disp: 90 tablet, Rfl: 0 .  latanoprost (XALATAN) 0.005 % ophthalmic solution, Place 1 drop into both eyes nightly., Disp: , Rfl:  .  lisinopril (ZESTRIL) 30 MG tablet, TAKE 1 TABLET BY MOUTH EVERY DAY, Disp: 90 tablet, Rfl: 1 .  NAMZARIC 28-10 MG CP24, TAKE 1 CAPSULE BY MOUTH EVERY DAY, Disp: 90 capsule, Rfl: 0 .  omeprazole (PRILOSEC) 20 MG capsule, TAKE 1 CAPSULE BY MOUTH EVERY DAY, Disp: 90 capsule, Rfl: 1 .  simvastatin (ZOCOR) 40 MG tablet, TAKE 1 TABLET BY MOUTH AT BEDTIME, Disp: 90 tablet, Rfl: 1 .  SYNTHROID 100 MCG tablet, TAKE 1 TABLET BY MOUTH EVERY DAY BEFORE BREAKFAST, Disp: 90 tablet, Rfl: 0 .  albuterol (VENTOLIN HFA) 108 (90 Base) MCG/ACT inhaler, Inhale 1 puff into the lungs every 6 (six) hours as needed for wheezing or shortness of breath. (Patient not taking: Reported on 12/03/2019), Disp: 6.7 g, Rfl: 5 .  atropine 1 % ophthalmic solution, INSTILL 1 GTT INTO OS D, Disp: , Rfl: 5  Allergies  Allergen Reactions  . Penicillins Hives     Review of Systems  Constitutional: Negative.  Negative for fatigue.  Respiratory: Negative.  Negative for shortness of breath and wheezing.   Cardiovascular: Negative for chest pain, palpitations and leg swelling.  Neurological: Negative for dizziness and headaches.  Psychiatric/Behavioral: Negative.  Negative for agitation and sleep disturbance.     Today's Vitals   12/03/19 1419  BP: 110/70  Pulse: 61  Temp: 98.4 F (36.9 C)  TempSrc: Oral  Weight: 101 lb 3.2 oz (45.9 kg)  Height: 4' 11.2" (1.504 m)  PainSc: 5   PainLoc: Chest   Body mass index is 20.3 kg/m.   Objective:  Physical  Exam Constitutional:      General: She is not in acute distress.    Appearance: Normal appearance.  Cardiovascular:     Rate and Rhythm: Normal rate and regular rhythm.     Pulses: Normal pulses.     Heart sounds: Normal heart sounds. No murmur.  Pulmonary:     Effort: Pulmonary effort is normal. No respiratory distress.     Breath sounds: Normal breath sounds.  Neurological:     General: No focal deficit present.     Mental Status: She is alert and oriented to person, place, and time.  Psychiatric:        Mood and Affect: Mood normal.        Behavior: Behavior normal.        Thought Content: Thought content normal.        Judgment: Judgment normal.         Assessment And Plan:     1. Essential hypertension  Chronic, good control now with the addition of amlodipine  - amLODipine (NORVASC) 5 MG tablet; Take 1 tablet (5 mg total) by mouth daily.  Dispense: 90 tablet; Refill: 1  2. Vitamin D deficiency  Take supplement as needed     Also encouraged to spend 15 minutes in the sun daily.   3. Acquired hypothyroidism  Chronic,   She had an underactive level at her last visit but did not return in 4 weeks after a medication change, will recheck today. Advised the daughter she may need to bring her back for labs at a later date.  - TSH - T4 - T3, free  4. Alzheimer's dementia without behavioral disturbance, unspecified timing of dementia onset (HCC)   Chronic, stable.   Continue current medications  Arnette Felts, FNP    THE PATIENT IS ENCOURAGED TO PRACTICE SOCIAL DISTANCING DUE TO THE COVID-19 PANDEMIC.

## 2019-12-04 ENCOUNTER — Emergency Department (HOSPITAL_COMMUNITY)
Admission: EM | Admit: 2019-12-04 | Discharge: 2019-12-04 | Disposition: A | Discharge status: Home / Self Care | Payer: Medicare Other | Attending: Emergency Medicine | Admitting: Emergency Medicine

## 2019-12-04 ENCOUNTER — Encounter (HOSPITAL_COMMUNITY): Payer: Self-pay | Admitting: Emergency Medicine

## 2019-12-04 ENCOUNTER — Other Ambulatory Visit: Payer: Self-pay

## 2019-12-04 ENCOUNTER — Emergency Department (HOSPITAL_COMMUNITY): Payer: Medicare Other

## 2019-12-04 ENCOUNTER — Ambulatory Visit (INDEPENDENT_AMBULATORY_CARE_PROVIDER_SITE_OTHER)
Admission: EM | Admit: 2019-12-04 | Discharge: 2019-12-04 | Disposition: A | Discharge status: Home / Self Care | Payer: Medicare Other | Source: Home / Self Care

## 2019-12-04 DIAGNOSIS — W19XXXA Unspecified fall, initial encounter: Secondary | ICD-10-CM | POA: Diagnosis not present

## 2019-12-04 DIAGNOSIS — I10 Essential (primary) hypertension: Secondary | ICD-10-CM | POA: Insufficient documentation

## 2019-12-04 DIAGNOSIS — G44319 Acute post-traumatic headache, not intractable: Secondary | ICD-10-CM | POA: Diagnosis not present

## 2019-12-04 DIAGNOSIS — S0083XA Contusion of other part of head, initial encounter: Secondary | ICD-10-CM | POA: Diagnosis not present

## 2019-12-04 DIAGNOSIS — S01111A Laceration without foreign body of right eyelid and periocular area, initial encounter: Secondary | ICD-10-CM | POA: Diagnosis not present

## 2019-12-04 DIAGNOSIS — Z8673 Personal history of transient ischemic attack (TIA), and cerebral infarction without residual deficits: Secondary | ICD-10-CM | POA: Diagnosis not present

## 2019-12-04 DIAGNOSIS — S0993XA Unspecified injury of face, initial encounter: Secondary | ICD-10-CM | POA: Diagnosis present

## 2019-12-04 DIAGNOSIS — G309 Alzheimer's disease, unspecified: Secondary | ICD-10-CM | POA: Diagnosis not present

## 2019-12-04 DIAGNOSIS — Z87891 Personal history of nicotine dependence: Secondary | ICD-10-CM | POA: Diagnosis not present

## 2019-12-04 DIAGNOSIS — Y929 Unspecified place or not applicable: Secondary | ICD-10-CM | POA: Diagnosis not present

## 2019-12-04 DIAGNOSIS — I2510 Atherosclerotic heart disease of native coronary artery without angina pectoris: Secondary | ICD-10-CM | POA: Diagnosis not present

## 2019-12-04 DIAGNOSIS — Z96653 Presence of artificial knee joint, bilateral: Secondary | ICD-10-CM | POA: Diagnosis not present

## 2019-12-04 DIAGNOSIS — Y939 Activity, unspecified: Secondary | ICD-10-CM | POA: Diagnosis not present

## 2019-12-04 DIAGNOSIS — Z79899 Other long term (current) drug therapy: Secondary | ICD-10-CM | POA: Insufficient documentation

## 2019-12-04 DIAGNOSIS — Y999 Unspecified external cause status: Secondary | ICD-10-CM | POA: Diagnosis not present

## 2019-12-04 DIAGNOSIS — S0181XA Laceration without foreign body of other part of head, initial encounter: Secondary | ICD-10-CM

## 2019-12-04 DIAGNOSIS — W0110XA Fall on same level from slipping, tripping and stumbling with subsequent striking against unspecified object, initial encounter: Secondary | ICD-10-CM | POA: Insufficient documentation

## 2019-12-04 DIAGNOSIS — E039 Hypothyroidism, unspecified: Secondary | ICD-10-CM | POA: Insufficient documentation

## 2019-12-04 DIAGNOSIS — S199XXA Unspecified injury of neck, initial encounter: Secondary | ICD-10-CM | POA: Diagnosis not present

## 2019-12-04 DIAGNOSIS — S0990XA Unspecified injury of head, initial encounter: Secondary | ICD-10-CM | POA: Insufficient documentation

## 2019-12-04 DIAGNOSIS — J45909 Unspecified asthma, uncomplicated: Secondary | ICD-10-CM | POA: Insufficient documentation

## 2019-12-04 LAB — T4: T4, Total: 12 ug/dL (ref 4.5–12.0)

## 2019-12-04 LAB — T3, FREE: T3, Free: 3.1 pg/mL (ref 2.0–4.4)

## 2019-12-04 LAB — TSH: TSH: 0.033 u[IU]/mL — ABNORMAL LOW (ref 0.450–4.500)

## 2019-12-04 MED ORDER — ACETAMINOPHEN 500 MG PO TABS
1000.0000 mg | ORAL_TABLET | Freq: Once | ORAL | Status: AC
  Administered 2019-12-04: 1000 mg via ORAL
  Filled 2019-12-04: qty 2

## 2019-12-04 MED ORDER — AMLODIPINE BESYLATE 5 MG PO TABS
5.0000 mg | ORAL_TABLET | Freq: Once | ORAL | Status: AC
  Administered 2019-12-04: 5 mg via ORAL
  Filled 2019-12-04: qty 1

## 2019-12-04 NOTE — ED Notes (Signed)
Patient is being discharged from the Urgent Care Center and sent to the Emergency Department via POV. Per Nena Jordan, patient is stable but in need of higher level of care due to HTN and nausea s/t head injury. Patient is aware and verbalizes understanding of plan of care.  Vitals:   12/04/19 1217 12/04/19 1221  BP: (!) 212/98 (!) 223/89  Pulse: 67   Resp: 16   Temp: 98.9 F (37.2 C)   SpO2: 100%    BP @ 1305 222/96  Report called to Asher Muir, RN

## 2019-12-04 NOTE — ED Triage Notes (Signed)
PT tripped and fell a few hours ago and struck her face/forehead on corner of wall. PT has a laceration along right eyebrow. Bleeding controlled without pressure. Denies LOC. Daughter denies any observed neurological changes. She is not taking a blood thinner.

## 2019-12-04 NOTE — ED Provider Notes (Signed)
[facility] EMERGENCY DEPARTMENT Provider Note   CSN: [id number] Arrival date & time: 12/04/19  1326     History Chief Complaint  Patient presents with  . Fall    Jordan [name] is a 86 y.o. female.  HPI Patient was seen in urgent care and referred to the emergency department for further evaluation.  Patient does not take any blood thinners.  About 1015 her daughter reports that she tripped by catching her shoe.  She struck her right forehead and laceration develop.  She also started getting headache in that area.  No confusion or loss of consciousness.  Patient's daughter reports she has been at normal baseline ever since.  Patient reports she has some pain around the laceration site but otherwise feels normal.  At urgent care patient had elevated blood pressure greater than 200 systolic.  Per report and patient, her blood pressure is usually normal systolic.    Past Medical History:  Diagnosis Date  . Allergy   . Anxiety    per pt's daughter  . Arthritis   . Asthma    pt reported  . Depression   . GERD (gastroesophageal reflux disease)   . Hearing deficit L  . High cholesterol   . Hypertension   . Osteoporosis   . Rheumatoid arthritis (HCC)   . Stroke [facility])     Patient Active Problem List   Diagnosis Date Noted  . Alzheimer's disease (HCC) 08/13/2019  . Essential hypertension 08/13/2018  . Acquired hypothyroidism 08/13/2018  . Rash and nonspecific skin eruption 08/13/2018  . Memory loss 04/12/2017  . Depression 04/12/2017  . Rheumatoid arthritis involving multiple sites with positive rheumatoid factor (HCC) 08/17/2016  . High risk medication use 08/17/2016  . H/O total knee replacement, bilateral 08/17/2016  . Spondylosis of lumbar region without myelopathy or radiculopathy 08/17/2016  . History of glaucoma/ patient is legally blind 08/17/2016  . History of gastroesophageal reflux (GERD) 08/17/2016  . History of diverticulosis 08/17/2016  .  History of hypothyroidism 08/17/2016  . history of asthmatic bronchitis 08/17/2016  . History of memory loss 08/17/2016  . History of atherosclerosis 08/17/2016    Past Surgical History:  Procedure Laterality Date  . ABDOMINAL HYSTERECTOMY    . KNEE ARTHROPLASTY    . REPLACEMENT TOTAL KNEE BILATERAL Bilateral   . WRIST SURGERY Left      OB History   No obstetric history on file.     No family history on file.  Social History   Tobacco Use  . Smoking status: Former Smoker    Packs/day: 0.25    Years: 13.00    Pack years: 3.25    Types: Cigarettes    Quit date: 08/18/2006    Years since quitting: 13.3  . Smokeless tobacco: Never Used  Substance Use Topics  . Alcohol use: No  . Drug use: Yes    Types: Hydrocodone    Home Medications Prior to Admission medications   Medication Sig Start Date End Date Taking? Authorizing Provider  albuterol (VENTOLIN HFA) 108 (90 Base) MCG/ACT inhaler Inhale 1 puff into the lungs every 6 (six) hours as needed for wheezing or shortness of breath. Patient not taking: Reported on 12/03/2019 06/03/19   Arnette Felts, FNP  ALLERGY RELIEF 10 MG tablet TAKE 1 TABLET BY MOUTH EVERY DAY 10/30/19   Arnette Felts, FNP  amLODipine (NORVASC) 5 MG tablet Take 1 tablet (5 mg total) by mouth daily. 12/03/19   Arnette Felts, FNP  atropine 1 %  ophthalmic solution INSTILL 1 GTT INTO OS D 10/19/17   [provider]  brimonidine-timolol (COMBIGAN) 0.2-0.5 % ophthalmic solution Place 1 drop into both eyes every 12 (twelve) hours.    [provider]  etanercept (ENBREL SURECLICK) 50 MG/ML injection Inject 0.98 mLs (50 mg total) into the skin once a week. 11/19/19   Pollyann Savoy, MD  fluticasone (FLONASE) 50 MCG/ACT nasal spray INSTILL 2 SPRAYS IN EACH NOSTRIL EVERY DAY 06/12/18   Arnette Felts, FNP  folic acid (FOLVITE) 1 MG tablet TAKE 1 TABLET(1 MG) BY MOUTH DAILY 07/12/18   Pollyann Savoy, MD  latanoprost (XALATAN) 0.005 % ophthalmic  solution Place 1 drop into both eyes nightly. 10/24/16   [provider]  lisinopril (ZESTRIL) 30 MG tablet TAKE 1 TABLET BY MOUTH EVERY DAY 10/30/19   Arnette Felts, FNP  [facility] 28-10 MG CP24 TAKE 1 CAPSULE BY MOUTH EVERY DAY 10/30/19   Arnette Felts, FNP  omeprazole (PRILOSEC) 20 MG capsule TAKE 1 CAPSULE BY MOUTH EVERY DAY 11/27/19   Arnette Felts, FNP  simvastatin (ZOCOR) 40 MG tablet TAKE 1 TABLET BY MOUTH AT BEDTIME 10/30/19   Arnette Felts, FNP  SYNTHROID 100 MCG tablet TAKE 1 TABLET BY MOUTH EVERY DAY BEFORE BREAKFAST 10/30/19   Arnette Felts, FNP    Allergies    Penicillins  Review of Systems   Review of Systems 10 Systems reviewed and are negative for acute change except as noted in the HPI.  Physical Exam Updated Vital Signs BP (!) 176/77 (BP Location: Right Arm)   Pulse 70   Temp 98.4 F (36.9 C)   Resp 16   SpO2 100%   Physical Exam Constitutional:      Comments: Patient is alert and appropriate.  No distress.  No respiratory distress.  HENT:     Head:     Comments: Patient has hematoma over right brow approximately 3 cm.  Overlying linear 1.5 cm laceration with very minor gaping.  No active bleeding.    Nose: Nose normal.     Mouth/Throat:     Mouth: Mucous membranes are moist.     Pharynx: Oropharynx is clear.  Eyes:     Extraocular Movements: Extraocular movements intact.     Conjunctiva/sclera: Conjunctivae normal.  Cardiovascular:     Rate and Rhythm: Normal rate and regular rhythm.  Pulmonary:     Effort: Pulmonary effort is normal.     Breath sounds: Normal breath sounds.  Abdominal:     General: There is no distension.     Palpations: Abdomen is soft.     Tenderness: There is no abdominal tenderness. There is no guarding.  Musculoskeletal:        General: No swelling or tenderness. Normal range of motion.     Cervical back: Neck supple.     Comments: No extremity deformities.  Normal range of motion at baseline.  Skin:    General: Skin is warm  and dry.  Neurological:     General: No focal deficit present.     Mental Status: She is oriented to person, place, and time.     Cranial Nerves: No cranial nerve deficit.     Coordination: Coordination normal.     Comments: Mental status clear.  Speech clear.  Movements coordinated purposeful symmetric.  Psychiatric:        Mood and Affect: Mood normal.     ED Results / Procedures / Treatments   Labs (all labs ordered are listed, but only abnormal  results are displayed) Labs Reviewed - No data to display  EKG None  Radiology CT Head Wo Contrast  Result Date: 12/04/2019 CLINICAL DATA:  Status post trauma. EXAM: CT HEAD WITHOUT CONTRAST TECHNIQUE: Contiguous axial images were obtained from the base of the skull through the vertex without intravenous contrast. COMPARISON:  None. FINDINGS: Brain: There is mild cerebral atrophy with widening of the extra-axial spaces and ventricular dilatation. There are areas of decreased attenuation within the white matter tracts of the supratentorial brain, consistent with microvascular disease changes. Vascular: No hyperdense vessel or unexpected calcification. Skull: Normal. Negative for fracture or focal lesion. Sinuses/Orbits: No acute finding. Other: None. IMPRESSION: 1. No acute intracranial abnormality. 2. Mild cerebral atrophy and microvascular disease changes of the supratentorial brain. Electronically Signed   By: Aram Candela M.D.   On: 12/04/2019 18:38   CT Cervical Spine Wo Contrast  Result Date: 12/04/2019 CLINICAL DATA:  Status post fall. EXAM: CT CERVICAL SPINE WITHOUT CONTRAST TECHNIQUE: Multidetector CT imaging of the cervical spine was performed without intravenous contrast. Multiplanar CT image reconstructions were also generated. COMPARISON:  None. FINDINGS: Alignment: Normal. Skull base and vertebrae: No acute fracture. No primary bone lesion or focal pathologic process. Soft tissues and spinal canal: No prevertebral fluid or  swelling. No visible canal hematoma. Disc levels: Moderate severity endplate sclerosis is seen at the levels of C3-C4, C4-C5, C5-C6 and C6-C7. Mild-to-moderate severity multilevel intervertebral disc space narrowing is also seen at these levels. There is bilateral mild-to-moderate severity multilevel facet joint hypertrophy, left greater than right. Upper chest: Negative. Other: None. IMPRESSION: 1. No acute osseous abnormality of the cervical spine. 2. Moderate severity multilevel degenerative changes. Electronically Signed   By: Aram Candela M.D.   On: 12/04/2019 17:53    Procedures .Marland KitchenLaceration Repair  Date/Time: 12/04/2019 6:54 PM Performed by: Arby Barrette, MD Authorized by: Arby Barrette, MD   Consent:    Consent obtained:  Verbal   Consent given by:  Patient   Risks discussed:  Infection, pain and poor cosmetic result Anesthesia (see MAR for exact dosages):    Anesthesia method:  None Laceration details:    Location:  Face   Face location:  R eyebrow   Length (cm):  1.5   Depth (mm):  5 Repair type:    Repair type:  Simple Exploration:    Hemostasis achieved with:  Direct pressure   Wound extent: areolar tissue violated   Treatment:    Area cleansed with:  Shur-Clens   Amount of cleaning:  Standard Skin repair:    Repair method:  Tissue adhesive Approximation:    Approximation:  Close Post-procedure details:    Dressing:  Open (no dressing)   (including critical care time)  Medications Ordered in ED Medications  acetaminophen (TYLENOL) tablet 1,000 mg (1,000 mg Oral Given 12/04/19 1549)  amLODipine (NORVASC) tablet 5 mg (5 mg Oral Given 12/04/19 1549)    ED Course  I have reviewed the triage vital signs and the nursing notes.  Pertinent labs & imaging results that were available during my care of the patient were reviewed by me and considered in my medical decision making (see chart for details).    MDM Rules/Calculators/A&P                       Patient sent from urgent care after mechanical fall.  CT head obtained without acute intracranial findings.  Patient had no neurologic dysfunction.  C-spine obtained without acute fracture.  Minor forehead laceration repaired with tissue adhesive.  Patient's blood pressure has been elevated greater than 644 systolic at urgent care.  Pressure has started trending down.  Patient was given an additional dose of amlodipine.  She typically takes 5 mg in the morning.  Pressures have trended down to 170s over 70s.  No signs of hypertensive urgency or emergency.  Ental status is clear.  Patient does not have generalized headache.  She does have some localized headache where her hematoma is.  No chest pain and no shortness of breath.  At this time stable to continue home medications.  She has lisinopril 30 mg to take this evening.  Instructions have been given on taking 10 mg of amlodipine in the morning if systolic blood pressure is greater than 150.  Her daughter will assist in monitoring blood pressures over the course of tomorrow.  Return precautions reviewed. Final Clinical Impression(s) / ED Diagnoses Final diagnoses:  Fall, initial encounter  Injury of head, initial encounter  Traumatic hematoma of forehead, initial encounter  Facial laceration, initial encounter    Rx / DC Orders ED Discharge Orders    None       Charlesetta Shanks, MD 12/04/19 1857

## 2019-12-04 NOTE — ED Notes (Signed)
Dermabond & extra gauze is at the bedside as ordered by EDP.

## 2019-12-04 NOTE — Discharge Instructions (Signed)
Patient was advised to go to ED for further evaluation 

## 2019-12-04 NOTE — ED Notes (Signed)
Sign-pad unavailable upon discharge. Pt verbalizes understanding of discharge instructions, a&ox4 upon departure. Wristband removed.

## 2019-12-04 NOTE — ED Provider Notes (Addendum)
[facility]    CSN: [id number] Arrival date & time: 12/04/19  1132      History   Chief Complaint Chief Complaint  Patient presents with  . Fall    HPI Jordan [name] is a 86 y.o. female.   Who is blind present to the urgent care with her daughter with a complaint of fall that happened today.  Reports she tripped and fell around 10:15 AM and struck her right forehead.  Laceration is present above the right eyebrow and she is also reporting worsening headache.  Currently bleeding is controlled.  Denies loss of consciousness.  Denies chills, fever, nausea, vomiting, diarrhea, paresthesia, confusion, facial drooping.  The history is provided by the patient. No language interpreter was used.    Past Medical History:  Diagnosis Date  . Allergy   . Anxiety    per pt's daughter  . Arthritis   . Asthma    pt reported  . Depression   . GERD (gastroesophageal reflux disease)   . Hearing deficit L  . High cholesterol   . Hypertension   . Osteoporosis   . Rheumatoid arthritis (HCC)   . Stroke [facility])     Patient Active Problem List   Diagnosis Date Noted  . Alzheimer's disease (HCC) 08/13/2019  . Essential hypertension 08/13/2018  . Acquired hypothyroidism 08/13/2018  . Rash and nonspecific skin eruption 08/13/2018  . Memory loss 04/12/2017  . Depression 04/12/2017  . Rheumatoid arthritis involving multiple sites with positive rheumatoid factor (HCC) 08/17/2016  . High risk medication use 08/17/2016  . H/O total knee replacement, bilateral 08/17/2016  . Spondylosis of lumbar region without myelopathy or radiculopathy 08/17/2016  . History of glaucoma/ patient is legally blind 08/17/2016  . History of gastroesophageal reflux (GERD) 08/17/2016  . History of diverticulosis 08/17/2016  . History of hypothyroidism 08/17/2016  . history of asthmatic bronchitis 08/17/2016  . History of memory loss 08/17/2016  . History of atherosclerosis 08/17/2016    Past  Surgical History:  Procedure Laterality Date  . ABDOMINAL HYSTERECTOMY    . KNEE ARTHROPLASTY    . REPLACEMENT TOTAL KNEE BILATERAL Bilateral   . WRIST SURGERY Left     OB History   No obstetric history on file.      Home Medications    Prior to Admission medications   Medication Sig Start Date End Date Taking? Authorizing Provider  albuterol (VENTOLIN HFA) 108 (90 Base) MCG/ACT inhaler Inhale 1 puff into the lungs every 6 (six) hours as needed for wheezing or shortness of breath. Patient not taking: Reported on 12/03/2019 06/03/19   Arnette Felts, FNP  ALLERGY RELIEF 10 MG tablet TAKE 1 TABLET BY MOUTH EVERY DAY 10/30/19   Arnette Felts, FNP  amLODipine (NORVASC) 5 MG tablet Take 1 tablet (5 mg total) by mouth daily. 12/03/19   Arnette Felts, FNP  atropine 1 % ophthalmic solution INSTILL 1 GTT INTO OS D 10/19/17   [provider]  brimonidine-timolol (COMBIGAN) 0.2-0.5 % ophthalmic solution Place 1 drop into both eyes every 12 (twelve) hours.    [provider]  etanercept (ENBREL SURECLICK) 50 MG/ML injection Inject 0.98 mLs (50 mg total) into the skin once a week. 11/19/19   Pollyann Savoy, MD  fluticasone (FLONASE) 50 MCG/ACT nasal spray INSTILL 2 SPRAYS IN EACH NOSTRIL EVERY DAY 06/12/18   Arnette Felts, FNP  folic acid (FOLVITE) 1 MG tablet TAKE 1 TABLET(1 MG) BY MOUTH DAILY 07/12/18   Pollyann Savoy, MD  latanoprost Harrel Lemon)  0.005 % ophthalmic solution Place 1 drop into both eyes nightly. 10/24/16   [provider]  lisinopril (ZESTRIL) 30 MG tablet TAKE 1 TABLET BY MOUTH EVERY DAY 10/30/19   Arnette Felts, FNP  [facility] 28-10 MG CP24 TAKE 1 CAPSULE BY MOUTH EVERY DAY 10/30/19   Arnette Felts, FNP  omeprazole (PRILOSEC) 20 MG capsule TAKE 1 CAPSULE BY MOUTH EVERY DAY 11/27/19   Arnette Felts, FNP  simvastatin (ZOCOR) 40 MG tablet TAKE 1 TABLET BY MOUTH AT BEDTIME 10/30/19   Arnette Felts, FNP  SYNTHROID 100 MCG tablet TAKE 1 TABLET BY MOUTH EVERY DAY BEFORE  BREAKFAST 10/30/19   Arnette Felts, FNP    Family History No family history on file.  Social History Social History   Tobacco Use  . Smoking status: Former Smoker    Packs/day: 0.25    Years: 13.00    Pack years: 3.25    Types: Cigarettes    Quit date: 08/18/2006    Years since quitting: 13.3  . Smokeless tobacco: Never Used  Substance Use Topics  . Alcohol use: No  . Drug use: Yes    Types: Hydrocodone     Allergies   Penicillins   Review of Systems Review of Systems  Constitutional: Negative.   Respiratory: Negative.   Cardiovascular: Negative.   Skin: Positive for color change and wound.  Neurological: Positive for headaches. Negative for speech difficulty.  All other systems reviewed and are negative.    Physical Exam Triage Vital Signs ED Triage Vitals  Enc Vitals Group     BP 12/04/19 1217 (!) 212/98     Pulse Rate 12/04/19 1217 67     Resp 12/04/19 1217 16     Temp 12/04/19 1217 98.9 F (37.2 C)     Temp src --      SpO2 12/04/19 1217 100 %     Weight --      Height --      Head Circumference --      Peak Flow --      Pain Score 12/04/19 1216 8     Pain Loc --      Pain Edu? --      Excl. in GC? --    No data found.  Updated Vital Signs BP (!) 222/96   Pulse 67   Temp 98.9 F (37.2 C)   Resp 16   SpO2 100%   Visual Acuity Right Eye Distance:   Left Eye Distance:   Bilateral Distance:    Right Eye Near:   Left Eye Near:    Bilateral Near:     Physical Exam Vitals and nursing note reviewed.  Constitutional:      General: She is not in acute distress.    Appearance: Normal appearance. She is normal weight. She is not ill-appearing, toxic-appearing or diaphoretic.  Cardiovascular:     Rate and Rhythm: Normal rate and regular rhythm.     Pulses: Normal pulses.     Heart sounds: Normal heart sounds. No murmur. No friction rub. No gallop.   Pulmonary:     Effort: Pulmonary effort is normal. No respiratory distress.     Breath  sounds: Normal breath sounds. No stridor. No wheezing, rhonchi or rales.  Chest:     Chest wall: No tenderness.  Skin:    General: Skin is warm.     Coloration: Skin is not ashen.     Findings: Laceration present.  Neurological:     Mental Status:  She is alert and oriented to person, place, and time.     Cranial Nerves: Cranial nerves are intact.     Sensory: Sensation is intact.     Motor: Motor function is intact.     Coordination: Coordination is intact.     Gait: Gait is intact.      UC Treatments / Results  Labs (all labs ordered are listed, but only abnormal results are displayed) Labs Reviewed - No data to display  EKG   Radiology No results found.  Procedures Procedures (including critical care time)  Medications Ordered in UC Medications - No data to display  Initial Impression / Assessment and Plan / UC Course  I have reviewed the triage vital signs and the nursing notes.  Pertinent labs & imaging results that were available during my care of the patient were reviewed by me and considered in my medical decision making (see chart for details).    Patient is stable at discharge and was advised to go to ED for further evaluation.  Blood pressure has been high above 740 systolic.  Otherwise states BP has been always below 110/90.  Neuro assessment was otherwise normal.  There is a concern of intracranial bleed.  She was advised to go to ED for further evaluation.  Final Clinical Impressions(s) / UC Diagnoses   Final diagnoses:  Fall, initial encounter  Laceration of forehead, initial encounter  Acute post-traumatic headache, not intractable     Discharge Instructions     Patient was advised to go to ED for further evaluation    ED Prescriptions    None     PDMP not reviewed this encounter.   Emerson Monte, FNP 12/04/19 1336    Emerson Monte, FNP 12/04/19 1338

## 2019-12-04 NOTE — ED Triage Notes (Signed)
Pt bib family who reports mechanical fall and pt hit her head on corner of the wall. Bleeding controlled. No blood thinners.

## 2019-12-04 NOTE — Discharge Instructions (Signed)
1.  Follow head injury precaution instructions.  Take extra strength Tylenol if needed per package instructions for headache or pain. 2.  Tissue glue has been applied to your forehead laceration.  You may rinse lightly over it and pat dry.  Avoid getting anything oily or greasy on it this will dissolve the glue.  Do not apply any antibiotic ointments. 3.  Return to the emergency department if you have any concerning symptoms such as headache, visual changes, confusion or other concerning symptoms.

## 2019-12-05 ENCOUNTER — Ambulatory Visit: Payer: Self-pay

## 2019-12-05 DIAGNOSIS — I10 Essential (primary) hypertension: Secondary | ICD-10-CM

## 2019-12-05 DIAGNOSIS — M0579 Rheumatoid arthritis with rheumatoid factor of multiple sites without organ or systems involvement: Secondary | ICD-10-CM

## 2019-12-05 DIAGNOSIS — G309 Alzheimer's disease, unspecified: Secondary | ICD-10-CM

## 2019-12-05 DIAGNOSIS — F0280 Dementia in other diseases classified elsewhere without behavioral disturbance: Secondary | ICD-10-CM

## 2019-12-05 NOTE — Patient Instructions (Signed)
Social Worker Visit Information  Goals we discussed today:  Goals Addressed            This Visit's Progress   . "I want to get my mom a life alert"       Daughter Stated CARE PLAN ENTRY (see longitudinal plan of care for additional care plan information)  Current Barriers:  . Limited access to caregiver . Frequent falls  related to vision impairment . Ongoing physical decline related to HTN, Rheumatoid Arthritis, Alzheimer's Disease  Social Work Clinical Goal(s):  [name] Over the next 30 days the patient and her daughter will work with SW to identify resources to assist with obtaining a personal emergency response system  CCM SW Interventions: Completed 12/05/19 with [name] . Inter-disciplinary care team collaboration (see longitudinal plan of care) . Collaboration with the patient provider whom reports recent fall and need for an emergency response system in the home . Successful outbound call placed to the patients daughter to review patient care coordination needs . Determined the patient seen in the ED recently due to fall  . Discussed desire to obtain a personal emergency response system on behalf of the patient pending cost . Provided education surrounding possibility of patients health plan provided a PERS (personal emergency response system) as a benefit under the health plan . Successful outreach to [facility] to verify benefits o Determined the patients health plan does cover a PERS under the [employer] [phone]) . Scheduled follow up call to the patient and her daughter over the next two business days to place order  Patient Self Care Activities:  . Self administers medications as prescribed . Attends all scheduled provider appointments . Calls provider office for new concerns or questions . Supportive family to assist with patient care needs  Initial goal documentation         Materials Provided: Verbal education about PERS  provided by phone  Follow Up Plan: SW will follow up with patient by phone over the next two business days   Bevelyn Ngo, BSW, CDP Social Worker, Certified Dementia Practitioner [facility] Care Management [phone]

## 2019-12-05 NOTE — Chronic Care Management (AMB) (Signed)
Chronic Care Management    Social Work Follow Up Note  12/05/2019 Name: Jordan [name] MRN: [id number] DOB: 05/05/1934  Jordan [name] is a 86 y.o. year old female who is a primary care patient of Arnette Felts, FNP. The CCM team was consulted for assistance with care coordination.   Review of patient status, including review of consultants reports, other relevant assessments, and collaboration with appropriate care team members and the patient's provider was performed as part of comprehensive patient evaluation and provision of chronic care management services.    SDOH (Social Determinants of Health) assessments performed: No    Outpatient Encounter Medications as of 12/05/2019  Medication Sig  . albuterol (VENTOLIN HFA) 108 (90 Base) MCG/ACT inhaler Inhale 1 puff into the lungs every 6 (six) hours as needed for wheezing or shortness of breath. (Patient not taking: Reported on 12/03/2019)  . ALLERGY RELIEF 10 MG tablet TAKE 1 TABLET BY MOUTH EVERY DAY  . amLODipine (NORVASC) 5 MG tablet Take 1 tablet (5 mg total) by mouth daily.  [name] atropine 1 % ophthalmic solution INSTILL 1 GTT INTO OS D  . brimonidine-timolol (COMBIGAN) 0.2-0.5 % ophthalmic solution Place 1 drop into both eyes every 12 (twelve) hours.  [name] etanercept (ENBREL SURECLICK) 50 MG/ML injection Inject 0.98 mLs (50 mg total) into the skin once a week.  . fluticasone (FLONASE) 50 MCG/ACT nasal spray INSTILL 2 SPRAYS IN EACH NOSTRIL EVERY DAY  . folic acid (FOLVITE) 1 MG tablet TAKE 1 TABLET(1 MG) BY MOUTH DAILY  . latanoprost (XALATAN) 0.005 % ophthalmic solution Place 1 drop into both eyes nightly.  [name] lisinopril (ZESTRIL) 30 MG tablet TAKE 1 TABLET BY MOUTH EVERY DAY  . NAMZARIC 28-10 MG CP24 TAKE 1 CAPSULE BY MOUTH EVERY DAY  . omeprazole (PRILOSEC) 20 MG capsule TAKE 1 CAPSULE BY MOUTH EVERY DAY  . simvastatin (ZOCOR) 40 MG tablet TAKE 1 TABLET BY MOUTH AT BEDTIME  . SYNTHROID 100 MCG tablet TAKE 1 TABLET BY MOUTH EVERY DAY  BEFORE BREAKFAST   No facility-administered encounter medications on file as of 12/05/2019.     Goals Addressed            This Visit's Progress   . "I want to get my mom a life alert"       Daughter Stated CARE PLAN ENTRY (see longitudinal plan of care for additional care plan information)  Current Barriers:  . Limited access to caregiver . Frequent falls  related to vision impairment . Ongoing physical decline related to HTN, Rheumatoid Arthritis, Alzheimer's Disease  Social Work Clinical Goal(s):  [name] Over the next 30 days the patient and her daughter will work with SW to identify resources to assist with obtaining a personal emergency response system  CCM SW Interventions: Completed 12/05/19 with [name] . Inter-disciplinary care team collaboration (see longitudinal plan of care) . Collaboration with the patient provider whom reports recent fall and need for an emergency response system in the home . Successful outbound call placed to the patients daughter to review patient care coordination needs . Determined the patient seen in the ED recently due to fall  . Discussed desire to obtain a personal emergency response system on behalf of the patient pending cost . Provided education surrounding possibility of patients health plan provided a PERS (personal emergency response system) as a benefit under the health plan . Successful outreach to [facility] to verify benefits o Determined the patients health plan does cover a PERS under the [employer] Benefit [phone]) . Scheduled follow up call to the patient and her daughter over the next two business days to place order  Patient Self Care Activities:  . Self administers medications as prescribed . Attends all scheduled provider appointments . Calls provider office for new concerns or questions . Supportive family to assist with patient care needs  Initial goal documentation         Follow Up Plan:  SW will follow up with patient by phone over the next two business days.   Daneen Schick, BSW, CDP Social Worker, Certified Dementia Practitioner [place] [facility] [place] [facility] [phone]  Total time spent performing care coordination and/or care management activities with the patient by phone or face to face = 13 minutes.

## 2019-12-06 ENCOUNTER — Ambulatory Visit: Payer: Self-pay

## 2019-12-06 DIAGNOSIS — M0579 Rheumatoid arthritis with rheumatoid factor of multiple sites without organ or systems involvement: Secondary | ICD-10-CM

## 2019-12-06 DIAGNOSIS — G309 Alzheimer's disease, unspecified: Secondary | ICD-10-CM

## 2019-12-06 DIAGNOSIS — F0280 Dementia in other diseases classified elsewhere without behavioral disturbance: Secondary | ICD-10-CM

## 2019-12-06 DIAGNOSIS — E039 Hypothyroidism, unspecified: Secondary | ICD-10-CM

## 2019-12-06 DIAGNOSIS — I10 Essential (primary) hypertension: Secondary | ICD-10-CM

## 2019-12-06 NOTE — Patient Instructions (Signed)
Social Worker Visit Information  Goals we discussed today:  Goals Addressed            This Visit's Progress   . "I want to get my mom a life alert"   On track    Daughter Stated CARE PLAN ENTRY (see longitudinal plan of care for additional care plan information)  Current Barriers:  . Limited access to caregiver . Frequent falls  related to vision impairment . Ongoing physical decline related to HTN, Rheumatoid Arthritis, Alzheimer's Disease  Social Work Clinical Goal(s):  [name] Over the next 30 days the patient and her daughter will work with SW to identify resources to assist with obtaining a personal emergency response system  CCM SW Interventions: Completed 12/06/19 with [name] . Inter-disciplinary care team collaboration (see longitudinal plan of care) . Successful outbound call placed to the patients daughter, [name] to inform of health plan coverage of a personal emergency response system . Assisted [name] with placing a call to [facility] [phone])  o Spoke with representative Antony Blackbird Successfully ordered a personal emergency response with 95% fall detection which will work anywhere in the [place] where [employer] cell towers operate o Confirmed patient would receive system within 3-4 weeks . Scheduled follow up appointment over the next month to confirm system in place . Collaboration with patients primary provider to inform of goal progression  Patient Self Care Activities:  . Self administers medications as prescribed . Attends all scheduled provider appointments . Calls provider office for new concerns or questions . Supportive family to assist with patient care needs  Please see past updates related to this goal by clicking on the "Past Updates" button in the selected goal          Materials Provided: Verbal education about health plan benefits provided by phone  Follow Up Plan: SW will follow up with patient by phone over the next  month.   Bevelyn Ngo, BSW, CDP Social Worker, Certified Dementia Practitioner [facility] Care Management [phone]

## 2019-12-06 NOTE — Chronic Care Management (AMB) (Signed)
Chronic Care Management    Social Work Follow Up Note  12/06/2019 Name: Jordan [name] MRN: [id number] DOB: [birth year]  Jordan [name] is a 86 y.o. year old female who is a primary care patient of Arnette Felts, FNP. The CCM team was consulted for assistance with care coordination.   Review of patient status, including review of consultants reports, other relevant assessments, and collaboration with appropriate care team members and the patient's provider was performed as part of comprehensive patient evaluation and provision of chronic care management services.    SDOH (Social Determinants of Health) assessments performed: No    Outpatient Encounter Medications as of 12/06/2019  Medication Sig  . albuterol (VENTOLIN HFA) 108 (90 Base) MCG/ACT inhaler Inhale 1 puff into the lungs every 6 (six) hours as needed for wheezing or shortness of breath. (Patient not taking: Reported on 12/03/2019)  . ALLERGY RELIEF 10 MG tablet TAKE 1 TABLET BY MOUTH EVERY DAY  . amLODipine (NORVASC) 5 MG tablet Take 1 tablet (5 mg total) by mouth daily.  [name] atropine 1 % ophthalmic solution INSTILL 1 GTT INTO OS D  . brimonidine-timolol (COMBIGAN) 0.2-0.5 % ophthalmic solution Place 1 drop into both eyes every 12 (twelve) hours.  [name] etanercept (ENBREL SURECLICK) 50 MG/ML injection Inject 0.98 mLs (50 mg total) into the skin once a week.  . fluticasone (FLONASE) 50 MCG/ACT nasal spray INSTILL 2 SPRAYS IN EACH NOSTRIL EVERY DAY  . folic acid (FOLVITE) 1 MG tablet TAKE 1 TABLET(1 MG) BY MOUTH DAILY  . latanoprost (XALATAN) 0.005 % ophthalmic solution Place 1 drop into both eyes nightly.  [name] lisinopril (ZESTRIL) 30 MG tablet TAKE 1 TABLET BY MOUTH EVERY DAY  . NAMZARIC 28-10 MG CP24 TAKE 1 CAPSULE BY MOUTH EVERY DAY  . omeprazole (PRILOSEC) 20 MG capsule TAKE 1 CAPSULE BY MOUTH EVERY DAY  . simvastatin (ZOCOR) 40 MG tablet TAKE 1 TABLET BY MOUTH AT BEDTIME  . SYNTHROID 100 MCG tablet TAKE 1 TABLET BY MOUTH EVERY DAY  BEFORE BREAKFAST   No facility-administered encounter medications on file as of 12/06/2019.     Goals Addressed            This Visit's Progress   . "I want to get my mom a life alert"   On track    Daughter Stated CARE PLAN ENTRY (see longitudinal plan of care for additional care plan information)  Current Barriers:  . Limited access to caregiver . Frequent falls  related to vision impairment . Ongoing physical decline related to HTN, Rheumatoid Arthritis, Alzheimer's Disease  Social Work Clinical Goal(s):  [name] Over the next 30 days the patient and her daughter will work with SW to identify resources to assist with obtaining a personal emergency response system  CCM SW Interventions: Completed 12/06/19 with Jordan [name] . Inter-disciplinary care team collaboration (see longitudinal plan of care) . Successful outbound call placed to the patients daughter, Jordan [name] to inform of health plan coverage of a personal emergency response system . Assisted Jordan [name] with placing a call to [facility] [phone])  o Spoke with representative Antony Blackbird Successfully ordered a personal emergency response with 95% fall detection which will work anywhere in the [place] where [employer] cell towers operate o Confirmed patient would receive system within 3-4 weeks . Scheduled follow up appointment over the next month to confirm system in place . Collaboration with patients primary provider to inform of goal progression  Patient Self Care Activities:  . Self administers medications as prescribed .  Attends all scheduled provider appointments . Calls provider office for new concerns or questions . Supportive family to assist with patient care needs  Please see past updates related to this goal by clicking on the "Past Updates" button in the selected goal          Follow Up Plan: SW will follow up with patient by phone over the next month.   Daneen Schick, BSW, CDP Social Worker,  Certified Dementia Practitioner [place] [facility] [place] [facility] [phone]  Total time spent performing care coordination and/or care management activities with the patient by phone or face to face = 40 minutes.

## 2019-12-11 ENCOUNTER — Telehealth: Payer: Self-pay

## 2019-12-11 ENCOUNTER — Ambulatory Visit: Payer: Self-pay

## 2019-12-11 ENCOUNTER — Other Ambulatory Visit: Payer: Self-pay

## 2019-12-11 DIAGNOSIS — E559 Vitamin D deficiency, unspecified: Secondary | ICD-10-CM

## 2019-12-11 DIAGNOSIS — I10 Essential (primary) hypertension: Secondary | ICD-10-CM

## 2019-12-11 DIAGNOSIS — M0579 Rheumatoid arthritis with rheumatoid factor of multiple sites without organ or systems involvement: Secondary | ICD-10-CM

## 2019-12-11 DIAGNOSIS — E039 Hypothyroidism, unspecified: Secondary | ICD-10-CM

## 2019-12-12 NOTE — Chronic Care Management (AMB) (Signed)
  Chronic Care Management   Outreach Note  12/12/2019 Name: Jordan [name] MRN: [id number] DOB: 27-Aug-1933  Referred by: Arnette Felts, FNP Reason for referral : Chronic Care Management (FU RN CM Call )   An unsuccessful telephone outreach was attempted today. The patient was referred to the case management team for assistance with care management and care coordination.   Follow Up Plan: Telephone follow up appointment with care management team member scheduled for: 01/10/20  Delsa Sale, RN, BSN, CCM Care Management Coordinator [facility] Management/[facility]  Direct Phone: [phone]

## 2019-12-16 ENCOUNTER — Telehealth: Payer: Self-pay

## 2019-12-16 NOTE — Telephone Encounter (Signed)
LVM for patient to call back to discuss medication decrease.

## 2019-12-20 ENCOUNTER — Ambulatory Visit: Payer: Self-pay

## 2019-12-20 DIAGNOSIS — E559 Vitamin D deficiency, unspecified: Secondary | ICD-10-CM

## 2019-12-20 DIAGNOSIS — M0579 Rheumatoid arthritis with rheumatoid factor of multiple sites without organ or systems involvement: Secondary | ICD-10-CM

## 2019-12-20 DIAGNOSIS — E039 Hypothyroidism, unspecified: Secondary | ICD-10-CM

## 2019-12-20 NOTE — Patient Instructions (Signed)
Social Worker Visit Information  Goals we discussed today:  Goals Addressed            This Visit's Progress   . "I am not sure what Vitamin D dosage mother should be taking"   Not on track    Daughter stated Current Barriers:  [name] Knowledge Deficits related to evaluation and treatment of Vitamin D deficiency . Chronic Disease Management support and education needs related to HTN, RN, Acquired Hypothyroidism, Vitamin D deficiency, Memory loss   Nurse Case Manager Clinical Goal(s):  [name] Over the next 90 days, patient will verbalize understanding of plan for evaluation and treatment of Vitamin D deficiency   Goal not met due to delay in labs . New 12/20/19 Over the next 45 days the patient will discuss plan for evaluation and treatment of Vitamin D deficiency   CCM SW Interventions Completed 12/20/19 with Jordan [name] . Chart review performed to note recent OV with primary care provider instructed patient to take over the counter supplement as needed o Reviewed recent labs to identify Vit D level has not been drawn in recent lab visits o Reviewed patient active medication list; unable to identify active Vitamin D orders . Successful outbound call placed to the patients daughter, Jordan [name], to discuss plan to manage vitamin D deficiency . Jordan [name] reports she has bought the patient some OTC vitamins and her brother "should be adding them to her medicine" . Discussed plans for SW to collaborate with Jordan Brine, FNP regarding planned Vitamin D regimen most appropriate for the patient . Confirmed knowledge of patient follow up appointment scheduled for 6/23 . Collaboration with RN Care Manager regarding goal progression and plan  CCM RN CM Interventions:  08/05/19 call completed with patient's daughter Jordan [name]  . Evaluation of current treatment plan related to Vitamin D deficiency and patient's adherence to plan as established by provider. . Provided education to patient re: the importance of raising  the Vitamin D level to help with immune and bone support  . Reviewed medications with patient and discussed patient is not currently prescribed Vitamin D but has been deficient in the past; discussed f/u with PCP to ask for recommendations . Collaborated with PCP Jordan Brine, FNP regarding recommendations for Vitamin D due to deficiency and no recent lab value . Discussed plans with patient for ongoing care management follow up and provided patient with direct contact information for care management team  Patient Self Care Activities:  . Unable to independently take medications as prescribed . Unable to self administer medications as prescribed  Please see past updates related to this goal by clicking on the "Past Updates" button in the selected goal      . COMPLETED: "I need help getting an upright walker for mother'       Daughter stated Current Barriers:  [name] Knowledge Deficits related to how to obtain DME, specifically an upright walker . Chronic Disease Management support and education needs related to HTN, RA, Vitamin D deficiency, Acquired Hypothyroidism, Memory loss   Nurse Case Manager Clinical Goal(s):  [name] Over the next 90 days, patient will work with the [place] and PCP to address needs related to DME and or other home safety equipment  Goal closure based on family decision based on monthly copay amounts  CCM SW Interventions: Completed 09/24/2019  . Collaboration with CMA indicating patient co-pay for upright walker is $400 . Outbound call placed to patients daughter Jordan [name] to determine the family is not willing to  pay for co-pay amount . Collaboration with Jordan Merino RN Case Manager to update on goal progression o RN Case Manager to follow up with the patient over the next 4-6 weeks  CCM RN CM Interventions:  08/05/19 call completed with patient's daughter Jordan [name] . Evaluation of current treatment plan related to DME and home safety and patient's adherence to plan as  established by provider. Jordan [name] with PCP Jordan Brine, FNP regarding Rx needed for an upright walker; instructed Jordan [name] to send the Rx to [place] and provided the following fax # [phone] . Discussed plans with patient for ongoing care management follow up and provided patient with direct contact information for care management team . Assessed for falls and or near falls; assessed for fall risk and or other home safety concerns . Determined patient has experienced no recent falls; discussed daughter does feel that she needs additional PCA hours, currently only receiving 3-3.5 hours on weekdays and family feels her dementia has worsened . Sent in basket message to embedded BSW Jordan [name] requesting she reach out to Jordan [name] to offer assistance with how to navigate and initiate the request for additional PCA hours  [place] Self Care Activities:  . Unable to independently take medications as prescribed . Unable to self administer medications as prescribed  Please see past updates related to this goal by clicking on the "Past Updates" button in the selected goal      . COMPLETED: "it would be easier if she had pill packs"       Daughter stated:  Current Barriers:  . iADL limitations related to patients impaired vision . Limited access to caregiver- the patient's son check on her daily to provide medication management after work, reported difficulty with medication management surrounding time frames . Memory Deficits - daughter reports the patient has early signs of dementia  Chronic Care Management Goal(s):  [name] Over the next 45 days, patient will work with embedded PharmD to address needs related to medication management  12/20/19 Chart review performed to note embedded PharmD initiated pill packaging system on 06/24/19 Goal met  CCM SW Interventions: Completed 04/05/2019 . Outbound call placed to the patients daughter Jordan [name] to assess progression of patient  goal. . Determined Jordan [name] has not yet received an e-mail from embedded pharmD and reports leaving Jordan. [name] a voice message requesting follow up on pill packaging system. . Collaboration with Jordan [name] via in basket message requesting phone call follow up to the patients daughter.   CCM PharmD Interventions: Completed call with patient's daughter, Jordan [name], on 06/24/2019 . Patient's daughter is requesting pill packaging for patient  . Will explore options and patient currently gets 90 day fills.  Do not want copay to increase for pill packaging as they only run monthly copays vs 49-monthsupplies. . Patient's daughter would like to try [facility] she likes their pill card system.  Patient is stable on current medications and dosing. .[facility]  [facility]   o Pill packaging ready to be delivered to the patient o [facility] [place] first month of pill packs on 06/24/19.  Patient to start today. o Will need to get enbrel RX called in by Rheum.  Patient is no longer on MTX/folic acid. o Medication list updated in EMR.   Patient Self Care Activities:  . Currently UNABLE TO independently administer own medications  Please  see past updates related to this goal by clicking on the "Past Updates" button in the selected goal      . "To keep mother's RA well managed"   On track    Daughter stated Current Barriers:  [name] Knowledge Deficits related to disease process and Self Health management of RA . Chronic Disease Management support and education needs related to HTN, RA, Vitamin D deficiency, Acquired Hypothyroidism , Memory Loss   Nurse Case Manager Clinical Goal(s):  [name] Over the next 90 days, patient will work with the CCM team to address needs related to disease education and support for treatment management of Rheumatoid Arthritis    Goal Met . New 12/20/19:  Over the next 120 days the patient will notify Rheumatologist with any increased stiffness or joint pain as indicated in more recent office visit notes . New 12/20/19- Over the next 120 days the patient will work with care management team to address barriers related to disease education and management of Rheumatoid Arthritis  CCM SW Interventions: Completed 12/20/19 with Jordan [name] . Comprehensive chart review performed . Noted recent office visit to see Rheumatologist on 11/13/19 o Visit notes reviewed o Patient doing well on Enbrel 50 mg SQ injections every 7 days o No RA flares noted during OV o Intermittent stiffness continues in both hands- patient continuing to wear arthritic gloves daily o Patient encouraged to notify Rheumatology office if increased joint pain or stiffness is experienced o Patient to follow up in approximately 5 months . Successful outbound call placed to [facility] to review goal progression . Confirmed patient continuing to adhere to medication regimen as outlined above by Rheumatologist . Discussed patient next scheduled appointment planned for 04/17/20 . Encouraged Jordan [name] to call Rheumatology office for a sooner appointment as needed . Collaboration with RN Care Manager, [place] to provide an update on goal progression and plan   CCM RN CM Interventions:  08/05/19 call completed with daughter Jordan [name] . Evaluation of current treatment plan related to Rheumatoid Arthritis and patient's adherence to plan as established by provider. . Reviewed medications with patient and discussed indication, dosage and frequency of patient's prescribed RA treatment; determined patient's son is injecting her Enbrel without issues or concerns . Discussed plans with patient for ongoing care management follow up and provided patient with direct contact information for care management team . Determined Dr. Estanislado [name] is managing patient's RA and currently her symptoms are stable . Discussed  next f/u with Dr. Estanislado [name] is scheduled for 08/15/19 _0 :20 AM; discussed daughter was planning to attend but is expecting inclement weather in her home state; discussed she will contact the office to arrange a coordinated virtual visit with her brother and or she will reschedule  Patient Self Care Activities:  . Unable to independently take medications as prescribed . Unable to self administer medications as prescribed  Please see past updates related to this goal by clicking on the "Past Updates" button in the selected goal      . I need to manage my thyroid condition   On track    Current Barriers:  [name] Knowledge Deficits related to disease state management of hypothyroidism  Pharmacist Clinical Goal(s):  [name] Over the next 90 days, patient will demonstrate Improved medication adherence as evidenced by starting to take thyroid medication in the morning separate as prescribed  Goal out of date . 12/20/19 New: Over the next 45 days the patient will adhere to medication regimen as discussed with patient primary provider  CCM SW Interventions Completed 12/20/19 . Performed chart review o Noted recent OV on 12/03/19 where the patients provider noted a slight over active thyroid  o Medication change to Synthroid Monday - Saturday and hold on Sunday o Patient to return in 4 weeks to recheck labs . Successful outbound call placed to the patients daughter Jordan [name] to review medication regimen . Confirmed patient compliant with medication changes o Patient son continues to assist with medication dosing . Discussed follow up appointment planned for 6/23 . Collaboration with RN Care Manager [place] regarding goal progression and changes to medication regimen  Interventions: . Comprehensive medication review performed. . Counseled daughter to encourage patient to take thyroid medication in the mornings (30-60 min separate from food and other medications). . Explained that patient's thyroid  levels have been variable.  Explained that compliance can be an issue.  We are hopeful pill packs will help with this situation . TSH=78 . Discussed with PCP, who may want to initiate brand name Synthroid for more steady levels . Will initiate brand name Synthroid next month . Will follow up  Patient Self Care Activities:  . Unable to independently take medications as prescribed . Unable to self administer medications as prescribed  Please see past updates related to this goal by clicking on the "Past Updates" button in the selected goal          Follow Up Plan: SW will follow up with patient by phone over the next month.   Jordan [name], BSW, CDP Social Worker, Certified Dementia Practitioner [place] [facility] [place] [facility] [phone]

## 2019-12-20 NOTE — Chronic Care Management (AMB) (Signed)
Chronic Care Management    Social Work Follow Up Note  12/20/2019 Name: Jordan [name] MRN: [id number] DOB: 1933/10/29  Jordan [name] is a 86 y.o. year old female who is a primary care patient of [name], [name]. The CCM team was consulted for assistance with care coordination.   Review of patient status, including review of consultants reports, other relevant assessments, and collaboration with appropriate care team members and the patient's provider was performed as part of comprehensive patient evaluation and provision of chronic care management services.    SDOH (Social Determinants of Health) assessments performed: No    Outpatient Encounter Medications as of 12/20/2019  Medication Sig  . albuterol (VENTOLIN HFA) 108 (90 Base) MCG/ACT inhaler Inhale 1 puff into the lungs every 6 (six) hours as needed for wheezing or shortness of breath. (Patient not taking: Reported on 12/03/2019)  . ALLERGY RELIEF 10 MG tablet TAKE 1 TABLET BY MOUTH EVERY DAY  . amLODipine (NORVASC) 5 MG tablet Take 1 tablet (5 mg total) by mouth daily.  Jordan [name] atropine 1 % ophthalmic solution INSTILL 1 GTT INTO OS D  . brimonidine-timolol (COMBIGAN) 0.2-0.5 % ophthalmic solution Place 1 drop into both eyes every 12 (twelve) hours.  Jordan [name] etanercept (ENBREL SURECLICK) 50 MG/ML injection Inject 0.98 mLs (50 mg total) into the skin once a week.  . fluticasone (FLONASE) 50 MCG/ACT nasal spray INSTILL 2 SPRAYS IN EACH NOSTRIL EVERY DAY  . folic acid (FOLVITE) 1 MG tablet TAKE 1 TABLET(1 MG) BY MOUTH DAILY  . latanoprost (XALATAN) 0.005 % ophthalmic solution Place 1 drop into both eyes nightly.  Jordan [name] lisinopril (ZESTRIL) 30 MG tablet TAKE 1 TABLET BY MOUTH EVERY DAY  . NAMZARIC 28-10 MG CP24 TAKE 1 CAPSULE BY MOUTH EVERY DAY  . omeprazole (PRILOSEC) 20 MG capsule TAKE 1 CAPSULE BY MOUTH EVERY DAY  . simvastatin (ZOCOR) 40 MG tablet TAKE 1 TABLET BY MOUTH AT BEDTIME  . SYNTHROID 100 MCG tablet TAKE 1 TABLET BY MOUTH EVERY DAY  BEFORE BREAKFAST   No facility-administered encounter medications on file as of 12/20/2019.     Goals Addressed            This Visit's Progress   . "I am not sure what Vitamin D dosage mother should be taking"   Not on track    Daughter stated Current Barriers:  Jordan [name] Knowledge Deficits related to evaluation and treatment of Vitamin D deficiency . Chronic Disease Management support and education needs related to HTN, RN, Acquired Hypothyroidism, Vitamin D deficiency, Memory loss   Nurse Case Manager Clinical Goal(s):  Jordan [name] Over the next 90 days, patient will verbalize understanding of plan for evaluation and treatment of Vitamin D deficiency   Goal not met due to delay in labs . New 12/20/19 Over the next 45 days the patient will discuss plan for evaluation and treatment of Vitamin D deficiency   CCM SW Interventions Completed 12/20/19 with [name] . Chart review performed to note recent OV with primary care provider instructed patient to take over the counter supplement as needed o Reviewed recent labs to identify Vit D level has not been drawn in recent lab visits o Reviewed patient active medication list; unable to identify active Vitamin D orders . Successful outbound call placed to the patients daughter, [name], to discuss plan to manage vitamin D deficiency . [name] reports she has bought the patient some OTC vitamins and her brother "should be adding them to her medicine" . Discussed plans for SW  to collaborate with [name], FNP regarding planned Vitamin D regimen most appropriate for the patient . Confirmed knowledge of patient follow up appointment scheduled for 6/23 . Collaboration with RN Care Manager regarding goal progression and plan  CCM RN CM Interventions:  08/05/19 call completed with patient's daughter [name]  . Evaluation of current treatment plan related to Vitamin D deficiency and patient's adherence to plan as established by provider. . Provided education to  patient re: the importance of raising the Vitamin D level to help with immune and bone support  . Reviewed medications with patient and discussed patient is not currently prescribed Vitamin D but has been deficient in the past; discussed f/u with PCP to ask for recommendations . Collaborated with PCP [name], FNP regarding recommendations for Vitamin D due to deficiency and no recent lab value . Discussed plans with patient for ongoing care management follow up and provided patient with direct contact information for care management team  Patient Self Care Activities:  . Unable to independently take medications as prescribed . Unable to self administer medications as prescribed  Please see past updates related to this goal by clicking on the "Past Updates" button in the selected goal      . COMPLETED: "I need help getting an upright walker for mother'       Daughter stated Current Barriers:  Jordan [name] Knowledge Deficits related to how to obtain DME, specifically an upright walker . Chronic Disease Management support and education needs related to HTN, RA, Vitamin D deficiency, Acquired Hypothyroidism, Memory loss   Nurse Case Manager Clinical Goal(s):  Jordan [name] Over the next 90 days, patient will work with the [place] and PCP to address needs related to DME and or other home safety equipment  Goal closure based on family decision based on monthly copay amounts  CCM SW Interventions: Completed 09/24/2019  . Collaboration with CMA indicating patient co-pay for upright walker is $400 . Outbound call placed to patients daughter [name] to determine the family is not willing to pay for co-pay amount . Collaboration with Barb Merino RN Case Manager to update on goal progression o RN Case Manager to follow up with the patient over the next 4-6 weeks  CCM RN CM Interventions:  08/05/19 call completed with patient's daughter [name] . Evaluation of current treatment plan related to DME and home safety  and patient's adherence to plan as established by provider. [name] with PCP [name], FNP regarding Rx needed for an upright walker; instructed [name] to send the Rx to [facility] and provided the following fax # [phone] . Discussed plans with patient for ongoing care management follow up and provided patient with direct contact information for care management team . Assessed for falls and or near falls; assessed for fall risk and or other home safety concerns . Determined patient has experienced no recent falls; discussed daughter does feel that she needs additional PCA hours, currently only receiving 3-3.5 hours on weekdays and family feels her dementia has worsened . Sent in basket message to embedded BSW Daneen Schick requesting she reach out to [name] to offer assistance with how to navigate and initiate the request for additional PCA hours  [place] Self Care Activities:  . Unable to independently take medications as prescribed . Unable to self administer medications as prescribed  Please see past updates related to this goal by clicking on the "Past Updates" button in the selected goal      . COMPLETED: "  it would be easier if she had pill packs"       Daughter stated:  Current Barriers:  . iADL limitations related to patients impaired vision . Limited access to caregiver- the patient's son check on her daily to provide medication management after work, reported difficulty with medication management surrounding time frames . Memory Deficits - daughter reports the patient has early signs of dementia  Chronic Care Management Goal(s):  Jordan [name] Over the next 45 days, patient will work with embedded PharmD to address needs related to medication management  12/20/19 Chart review performed to note embedded PharmD initiated pill packaging system on 06/24/19 Goal met  CCM SW Interventions: Completed 04/05/2019 . Outbound call placed to the patients daughter [name] to  assess progression of patient goal. . Determined [name] has not yet received an e-mail from embedded pharmD and reports leaving Mrs. Pruitt a voice message requesting follow up on pill packaging system. . Collaboration with [name] via in basket message requesting phone call follow up to the patients daughter.   CCM PharmD Interventions: Completed call with patient's daughter, [name], on 06/24/2019 . Patient's daughter is requesting pill packaging for patient  . Will explore options and patient currently gets 90 day fills.  Do not want copay to increase for pill packaging as they only run monthly copays vs 5-monthsupplies. . [place].  Patient is stable on current medications and dosing. .[facility]  [facility]   o Pill packaging ready to be delivered to the patient o [place] first month [place] pill [place] on [place]/24/19.  Patient to start today. o Will need to get enbrel RX called in by Rheum.  Patient is no longer on MTX/folic acid. o Medication list updated in EMR.   Patient Self Care Activities:  . Currently UNABLE TO independently administer own medications  Please see past updates related to this goal by clicking on the "Past Updates" button in the selected goal      . "To keep mother's RA well managed"   On track    Daughter stated Current Barriers:  .Jordan KitchenKnowledge Deficits related to disease process and Self Health management of RA . Chronic Disease Management support and education needs related to HTN, RA, Vitamin D deficiency, Acquired Hypothyroidism , Memory Loss   Nurse Case Manager Clinical Goal(s):  .Jordan KitchenOver the next 90 days, patient will work with the CCM team to address needs related to disease education and support for treatment management of Rheumatoid  Arthritis    Goal Met . New 12/20/19: Over the next 120 days the patient will notify Rheumatologist with any increased stiffness or joint pain as indicated in more recent office visit notes . New 12/20/19- Over the next 120 days the patient will work with care management team to address barriers related to disease education and management of Rheumatoid Arthritis  CCM SW Interventions: Completed 12/20/19 with TRodrigo Ran. Comprehensive chart review performed . Noted recent office visit to see Rheumatologist on 11/13/19 o Visit notes reviewed o Patient doing well on Enbrel 50 mg SQ injections every 7 days o No RA flares noted during OV o Intermittent stiffness continues in both hands- patient continuing to wear arthritic gloves daily o Patient encouraged to notify Rheumatology office if increased joint pain or stiffness is experienced  o Patient to follow up in approximately 5 months . Successful outbound call placed to [facility] to review goal progression . Confirmed patient continuing to adhere to medication regimen as outlined above by Rheumatologist . Discussed patient next scheduled appointment planned for 04/17/20 . Encouraged [name] to call Rheumatology office for a sooner appointment as needed . Collaboration with RN Care Manager, [place] to provide an update on goal progression and plan   CCM RN CM Interventions:  08/05/19 call completed with daughter [name] . Evaluation of current treatment plan related to Rheumatoid Arthritis and patient's adherence to plan as established by provider. . Reviewed medications with patient and discussed indication, dosage and frequency of patient's prescribed RA treatment; determined patient's son is injecting her Enbrel without issues or concerns . Discussed plans with patient for ongoing care management follow up and provided patient with direct contact information for care management team . Determined Dr. Estanislado Pandy is managing patient's RA and  currently her symptoms are stable . Discussed next f/u with Dr. Estanislado Pandy is scheduled for 08/15/19 @10 :20 AM; discussed daughter was planning to attend but is expecting inclement weather in her home state; discussed she will contact the office to arrange a coordinated virtual visit with her brother and or she will reschedule  Patient Self Care Activities:  . Unable to independently take medications as prescribed . Unable to self administer medications as prescribed  Please see past updates related to this goal by clicking on the "Past Updates" button in the selected goal      . I need to manage my thyroid condition   On track    Current Barriers:  Jordan [name] Knowledge Deficits related to disease state management of hypothyroidism  Pharmacist Clinical Goal(s):  Jordan [name] Over the next 90 days, patient will demonstrate Improved medication adherence as evidenced by starting to take thyroid medication in the morning separate as prescribed  Goal out of date . 12/20/19 New: Over the next 45 days the patient will adhere to medication regimen as discussed with patient primary provider  CCM SW Interventions Completed 12/20/19 . Performed chart review o Noted recent OV on 12/03/19 where the patients provider noted a slight over active thyroid  o Medication change to Synthroid Monday - Saturday and hold on Sunday o Patient to return in 4 weeks to recheck labs . Successful outbound call placed to the patients daughter [name] to review medication regimen . Confirmed patient compliant with medication changes o Patient son continues to assist with medication dosing . Discussed follow up appointment planned for 6/23 . Collaboration with RN Care Manager [place] regarding goal progression and changes to medication regimen  Interventions: . Comprehensive medication review performed. . Counseled daughter to encourage patient to take thyroid medication in the mornings (30-60 min separate from food and other  medications). . Explained that patient's thyroid levels have been variable.  Explained that compliance can be an issue.  We are hopeful pill packs will help with this situation . TSH=78 . Discussed with PCP, who may want to initiate brand name Synthroid for more steady levels . Will initiate brand name Synthroid next month . Will follow up  Patient Self Care Activities:  . Unable to independently take medications as prescribed . Unable to self administer medications as prescribed  Please see past updates related to this goal by clicking on the "Past Updates" button in the selected goal          Follow Up Plan: SW will follow up with patient  by phone over the next month.   Daneen Schick, BSW, CDP Social Worker, Certified Dementia Practitioner [place] [facility] [place] [facility] [phone]  Total time spent performing care coordination and/or care management activities with the patient by phone or face to face = 45 minutes.

## 2019-12-25 DIAGNOSIS — Z049 Encounter for examination and observation for unspecified reason: Secondary | ICD-10-CM | POA: Diagnosis not present

## 2020-01-10 ENCOUNTER — Telehealth: Payer: Self-pay

## 2020-01-13 ENCOUNTER — Other Ambulatory Visit: Payer: Self-pay | Admitting: Nurse Practitioner

## 2020-01-14 ENCOUNTER — Telehealth: Payer: Self-pay

## 2020-01-14 ENCOUNTER — Ambulatory Visit (INDEPENDENT_AMBULATORY_CARE_PROVIDER_SITE_OTHER): Payer: Medicare Other

## 2020-01-14 ENCOUNTER — Ambulatory Visit: Payer: Medicare Other

## 2020-01-14 ENCOUNTER — Ambulatory Visit: Payer: Self-pay

## 2020-01-14 ENCOUNTER — Other Ambulatory Visit: Payer: Self-pay

## 2020-01-14 DIAGNOSIS — G309 Alzheimer's disease, unspecified: Secondary | ICD-10-CM | POA: Diagnosis not present

## 2020-01-14 DIAGNOSIS — F0280 Dementia in other diseases classified elsewhere without behavioral disturbance: Secondary | ICD-10-CM

## 2020-01-14 DIAGNOSIS — E559 Vitamin D deficiency, unspecified: Secondary | ICD-10-CM

## 2020-01-14 DIAGNOSIS — I10 Essential (primary) hypertension: Secondary | ICD-10-CM | POA: Diagnosis not present

## 2020-01-14 DIAGNOSIS — E039 Hypothyroidism, unspecified: Secondary | ICD-10-CM | POA: Diagnosis not present

## 2020-01-14 DIAGNOSIS — M0579 Rheumatoid arthritis with rheumatoid factor of multiple sites without organ or systems involvement: Secondary | ICD-10-CM

## 2020-01-14 NOTE — Patient Instructions (Signed)
Social Worker Visit Information  Goals we discussed today:  Goals Addressed            This Visit's Progress   . COMPLETED: "I want to get my mom a life alert"       Daughter [place] (see longitudinal plan of care for additional care plan information)  Current Barriers:  . Limited access to caregiver . Frequent falls  related to vision impairment . Ongoing physical decline related to HTN, Rheumatoid Arthritis, Alzheimer's Disease  Social Work Clinical Goal(s):  [name] Over the next 30 days the patient and her daughter will work with SW to identify resources to assist with obtaining a personal emergency response system  CCM SW Interventions: Completed 01/14/20 with [name] . Inter-disciplinary care team collaboration (see longitudinal plan of care) . Successful outbound call placed to the patients daughter, [name] in response to voice message received . Determined patient has received in home emergency response system o [name] is at her home is [place] and unable to assist with install o SW discussed importance of being in the patient home while initiating services o [name] plans to contact her brother to provide assistance . Encouraged [name] to contact SW as needed with future care coordination concerns . Goal met  Patient Self Care Activities:  . Self administers medications as prescribed . Attends all scheduled provider appointments . Calls provider office for new concerns or questions . Supportive family to assist with patient care needs  Please see past updates related to this goal by clicking on the "Past Updates" button in the selected goal          Follow Up Plan: No SW follow up planned at this time. Please contact me with future resource needs.   Daneen Schick, BSW, CDP Social Worker, Certified Dementia Practitioner [place] [facility] [place] [facility] [phone]

## 2020-01-14 NOTE — Chronic Care Management (AMB) (Signed)
Chronic Care Management    Social Work Follow Up Note  01/14/2020 Name: [name] MRN: [id number] DOB: June 14, 1934  Jordan [name] is a 86 y.o. year old female who is a primary care patient of [name], [place]. The CCM team was consulted for assistance with care coordination.   Review of patient status, including review of consultants reports, other relevant assessments, and collaboration with appropriate care team members and the patient's provider was performed as part of comprehensive patient evaluation and provision of chronic care management services.    SDOH (Social Determinants of Health) assessments performed: No    Outpatient Encounter Medications as of 01/14/2020  Medication Sig  . albuterol (VENTOLIN HFA) 108 (90 Base) MCG/ACT inhaler Inhale 1 puff into the lungs every 6 (six) hours as needed for wheezing or shortness of breath. (Patient not taking: Reported on 12/03/2019)  . ALLERGY RELIEF 10 MG tablet TAKE 1 TABLET BY MOUTH EVERY DAY  . amLODipine (NORVASC) 5 MG tablet Take 1 tablet (5 mg total) by mouth daily.  [name] atropine 1 % ophthalmic solution INSTILL 1 GTT INTO OS D  . brimonidine-timolol (COMBIGAN) 0.2-0.5 % ophthalmic solution Place 1 drop into both eyes every 12 (twelve) hours.  [name] etanercept (ENBREL SURECLICK) 50 MG/ML injection Inject 0.98 mLs (50 mg total) into the skin once a week.  . fluticasone (FLONASE) 50 MCG/ACT nasal spray INSTILL 2 SPRAYS IN EACH NOSTRIL EVERY DAY  . folic acid (FOLVITE) 1 MG tablet TAKE 1 TABLET(1 MG) BY MOUTH DAILY  . latanoprost (XALATAN) 0.005 % ophthalmic solution Place 1 drop into both eyes nightly.  [name] lisinopril (ZESTRIL) 30 MG tablet TAKE 1 TABLET BY MOUTH EVERY DAY  . NAMZARIC 28-10 MG CP24 TAKE 1 CAPSULE BY MOUTH EVERY DAY  . omeprazole (PRILOSEC) 20 MG capsule TAKE 1 CAPSULE BY MOUTH EVERY DAY  . simvastatin (ZOCOR) 40 MG tablet TAKE 1 TABLET BY MOUTH AT BEDTIME  . SYNTHROID 100 MCG tablet TAKE 1 TABLET BY MOUTH EVERY DAY  BEFORE BREAKFAST   No facility-administered encounter medications on file as of 01/14/2020.     Goals Addressed            This Visit's Progress   . COMPLETED: "I want to get my mom a life alert"       Daughter [place] (see longitudinal plan of care for additional care plan information)  Current Barriers:  . Limited access to caregiver . Frequent falls  related to vision impairment . Ongoing physical decline related to HTN, Rheumatoid Arthritis, Alzheimer's Disease  Social Work Clinical Goal(s):  [name] Over the next 30 days the patient and her daughter will work with SW to identify resources to assist with obtaining a personal emergency response system  CCM SW Interventions: Completed 01/14/20 with [name] . Inter-disciplinary care team collaboration (see longitudinal plan of care) . Successful outbound call placed to the patients daughter, [name] in response to voice message received . Determined patient has received in home emergency response system o [name] is at her home is [place] and unable to assist with install o SW discussed importance of being in the patient home while initiating services o [name] plans to contact her brother to provide assistance . Encouraged [name] to contact SW as needed with future care coordination concerns . Goal met  Patient Self Care Activities:  . Self administers medications as prescribed . Attends all scheduled provider appointments . Calls provider office for new concerns or questions .  Supportive family to assist with patient care needs  Please see past updates related to this goal by clicking on the "Past Updates" button in the selected goal          Follow Up Plan: No SW follow up planned at this time. The patient will remain active with RN Care Manager.    Daneen Schick, BSW, CDP Social Worker, Certified Dementia Practitioner [place] [facility] [place] [facility] [phone]  Total time  spent performing care coordination and/or care management activities with the patient by phone or face to face = 12 minutes.

## 2020-01-15 ENCOUNTER — Other Ambulatory Visit: Payer: Self-pay

## 2020-01-15 ENCOUNTER — Ambulatory Visit (INDEPENDENT_AMBULATORY_CARE_PROVIDER_SITE_OTHER): Payer: Medicare Other

## 2020-01-15 ENCOUNTER — Encounter: Payer: Medicare Other | Admitting: Nurse Practitioner

## 2020-01-15 ENCOUNTER — Encounter: Payer: Self-pay | Admitting: Nurse Practitioner

## 2020-01-15 VITALS — BP 110/68 | HR 58 | Temp 97.9°F | Ht 59.0 in | Wt 100.0 lb

## 2020-01-15 DIAGNOSIS — E039 Hypothyroidism, unspecified: Secondary | ICD-10-CM

## 2020-01-15 DIAGNOSIS — Z0000 Encounter for general adult medical examination without abnormal findings: Secondary | ICD-10-CM

## 2020-01-15 NOTE — Progress Notes (Signed)
This visit occurred during the SARS-CoV-2 public health emergency.  Safety protocols were in place, including screening questions prior to the visit, additional usage of staff PPE, and extensive cleaning of exam room while observing appropriate contact time as indicated for disinfecting solutions.  Subjective:   Jordan [name] is a 86 y.o. female who presents for Medicare Annual (Subsequent) preventive examination.  Review of Systems    n/a Cardiac Risk Factors include: advanced age (>59mn, >>3women);hypertension;sedentary lifestyle     Objective:    Today's Vitals   01/15/20 1032 01/15/20 1033  BP: 110/68   Pulse: (!) 58   Temp: 97.9 F (36.6 C)   TempSrc: Oral   Weight: 100 lb (45.4 kg)   Height: 4' 11" (1.499 m)   PainSc:  10-Worst pain ever   Body mass index is 20.2 kg/m.  Advanced Directives 01/15/2020 12/04/2019 01/08/2019 01/16/2018  Does Patient Have a Medical Advance Directive? Yes No Yes Yes  Type of [place] - Healthcare Power of AOnagain Chart? No - copy requested - No - copy requested -  Would patient like information on creating a medical advance directive? - No - Patient declined - -    Current Medications (verified) Outpatient Encounter Medications as of 01/15/2020  Medication Sig  . albuterol (VENTOLIN HFA) 108 (90 Base) MCG/ACT inhaler Inhale 1 puff into the lungs every 6 (six) hours as needed for wheezing or shortness of breath. (Patient not taking: Reported on 12/03/2019)  . ALLERGY RELIEF 10 MG tablet TAKE 1 TABLET BY MOUTH EVERY DAY  . amLODipine (NORVASC) 5 MG tablet Take 1 tablet (5 mg total) by mouth daily.  .Marland Kitchenatropine 1 % ophthalmic solution INSTILL 1 GTT INTO OS D  . brimonidine-timolol (COMBIGAN) 0.2-0.5 % ophthalmic solution Place 1 drop into both eyes every 12 (twelve) hours.  .Marland Kitchenetanercept (ENBREL SURECLICK) 50 MG/ML injection  Inject 0.98 mLs (50 mg total) into the skin once a week.  . fluticasone (FLONASE) 50 MCG/ACT nasal spray INSTILL 2 SPRAYS IN EACH NOSTRIL EVERY DAY  . folic acid (FOLVITE) 1 MG tablet TAKE 1 TABLET(1 MG) BY MOUTH DAILY  . latanoprost (XALATAN) 0.005 % ophthalmic solution Place 1 drop into both eyes nightly.  .Marland Kitchenlisinopril (ZESTRIL) 30 MG tablet TAKE 1 TABLET BY MOUTH EVERY DAY  . NAMZARIC 28-10 MG CP24 TAKE 1 CAPSULE BY MOUTH EVERY DAY  . omeprazole (PRILOSEC) 20 MG capsule TAKE 1 CAPSULE BY MOUTH EVERY DAY  . simvastatin (ZOCOR) 40 MG tablet TAKE 1 TABLET BY MOUTH AT BEDTIME (Patient taking differently: Every other day.)  . SYNTHROID 100 MCG tablet TAKE 1 TABLET BY MOUTH EVERY DAY BEFORE BREAKFAST   No facility-administered encounter medications on file as of 01/15/2020.    Allergies (verified) Penicillins   History: Past Medical History:  Diagnosis Date  . Allergy   . Anxiety    per pt's daughter  . Arthritis   . Asthma    pt reported  . Depression   . GERD (gastroesophageal reflux disease)   . Hearing deficit L  . High cholesterol   . Hypertension   . Osteoporosis   . [place] ([place]   . [facility]    Past Surgical History:  Procedure Laterality Date  . ABDOMINAL HYSTERECTOMY    . KNEE ARTHROPLASTY    . REPLACEMENT TOTAL KNEE BILATERAL Bilateral   . WRIST SURGERY Left  History reviewed. No pertinent family history. Social History   Socioeconomic History  . Marital status: Single    Spouse name: Not on file  . Number of children: 2  . Years of education: 8th  . Highest education level: Not on file  Occupational History  . Occupation: Retired  Tobacco Use  . Smoking status: Former Smoker    Packs/day: 0.25    Years: 13.00    Pack years: 3.25    Types: Cigarettes    Quit date: 08/18/2006    Years since quitting: 13.4  . Smokeless tobacco: Never Used  Vaping Use  . Vaping Use: Never used  Substance and Sexual Activity  . Alcohol use: No  .  Drug use: Not Currently    Types: Hydrocodone  . Sexual activity: Not Currently  Other Topics Concern  . Not on file  Social History Narrative   Lives at home with grandson.   Right-handed.   2-3 cups coffee per day.   Social Determinants of Health   Financial Resource Strain: Low Risk   . Difficulty of Paying Living Expenses: Not hard at all  Food Insecurity: No Food Insecurity  . Worried About Charity fundraiser in the Last Year: Never true  . Ran Out of Food in the Last Year: Never true  Transportation Needs: No Transportation Needs  . Lack of Transportation (Medical): No  . Lack of Transportation (Non-Medical): No  Physical Activity: Inactive  . Days of Exercise per Week: 0 days  . Minutes of Exercise per Session: 0 min  Stress: No Stress Concern Present  . Feeling of Stress : Not at all  Social Connections:   . Frequency of Communication with Friends and Family:   . Frequency of Social Gatherings with Friends and Family:   . Attends Religious Services:   . Active Member of Clubs or Organizations:   . Attends Archivist Meetings:   [name] Marital Status:     Tobacco Counseling Counseling given: Not Answered   Clinical Intake:  Pre-visit preparation completed: Yes  Pain : 0-10 Pain Score: 10-Worst pain ever Pain Type: Acute pain Pain Location: Eye Pain Orientation: Right Pain Descriptors / Indicators: Sharp, Throbbing Pain Onset: More than a month ago Pain Frequency: Constant Pain Relieving Factors: does not take anything  Pain Relieving Factors: does not take anything  Nutritional Status: BMI of 19-24  Normal Nutritional Risks: None Diabetes: No  How often do you need to have someone help you when you read instructions, pamphlets, or other written materials from your doctor or pharmacy?: 1 - Never  Diabetic?no  Interpreter Needed?: No  Information entered by :: NAllen LPN   Activities of Daily Living In your present state of health, do you  have any difficulty performing the following activities: 01/15/2020 01/15/2020  Hearing? Y N  Comment at times -  Vision? 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 Tempie Donning  Walking or climbing stairs? Y Y  Dressing or bathing? Tempie Donning  Comment has aide -  Doing errands, shopping? Tempie Donning  Comment family -  Conservation officer, nature and eating ? Y -  Using the Toilet? Y -  In the past six months, have you accidently leaked urine? Y -  Comment wears depends -  Do you have problems with loss of bowel control? Y -  Comment wears depends -  Managing your Medications? Y -  Managing your Finances? Y -  Housekeeping  or managing your Housekeeping? Y -  Some recent data might be hidden    Patient Care Team: [name], FNP as PCP - General ([place]) Daneen Schick as Social Worker [place], Claudette Stapler, RN as Case Manager  Indicate any recent Medical Services you may have received from other than [facility] providers in the past year (date may be approximate).     Assessment:   This is a routine wellness examination for Morrison.  Hearing/Vision screen  Hearing Screening   125Hz 250Hz 500Hz 1000Hz 2000Hz 3000Hz 4000Hz 6000Hz 8000Hz  Right ear:           Left ear:           Vision Screening Comments: Regular eye exams  Dietary issues and exercise activities discussed: Current Exercise Habits: The patient does not participate in regular exercise at present  Goals    . "I am not sure what Vitamin D dosage mother should be taking"     Daughter stated Current Barriers:  [name] Knowledge Deficits related to evaluation and treatment of Vitamin D deficiency . Chronic Disease Management support and education needs related to HTN, RN, Acquired Hypothyroidism, Vitamin D deficiency, Memory loss   Nurse Case Manager Clinical Goal(s):  [name] Over the next 90 days, patient will verbalize understanding of plan for evaluation and treatment of Vitamin D deficiency   Goal not met due to delay  in labs . New 12/20/19 Over the next 45 days the patient will discuss plan for evaluation and treatment of Vitamin D deficiency   CCM SW Interventions Completed 12/20/19 with [name] . Chart review performed to note recent OV with primary care provider instructed patient to take over the counter supplement as needed o Reviewed recent labs to identify Vit D level has not been drawn in recent lab visits o Reviewed patient active medication list; unable to identify active Vitamin D orders . Successful outbound call placed to the patients daughter, [name], to discuss plan to manage vitamin D deficiency . [name] reports she has bought the patient some OTC vitamins and her brother "should be adding them to her medicine" . Discussed plans for SW to collaborate with [name], FNP regarding planned Vitamin D regimen most appropriate for the patient . Confirmed knowledge of patient follow up appointment scheduled for 6/23 . Collaboration with RN Care Manager regarding goal progression and plan  CCM RN CM Interventions:  08/05/19 call completed with patient's daughter [name]  . Evaluation of current treatment plan related to Vitamin D deficiency and patient's adherence to plan as established by provider. . Provided education to patient re: the importance of raising the Vitamin D level to help with immune and bone support  . Reviewed medications with patient and discussed patient is not currently prescribed Vitamin D but has been deficient in the past; discussed f/u with PCP to ask for recommendations . Collaborated with PCP [name], FNP regarding recommendations for Vitamin D due to deficiency and no recent lab value . Discussed plans with patient for ongoing care management follow up and provided patient with direct contact information for care management team  Patient Self Care Activities:  . Unable to independently take medications as prescribed . Unable to self administer medications as  prescribed  Please see past updates related to this goal by clicking on the "Past Updates" button in the selected goal      . "To keep mother's RA well managed"     Daughter stated Current Barriers:  .  Knowledge Deficits related to disease process and Self Health management of RA . Chronic Disease Management support and education needs related to HTN, RA, Vitamin D deficiency, Acquired Hypothyroidism , Memory Loss   Nurse Case Manager Clinical Goal(s):  [name] Over the next 90 days, patient will work with the CCM team to address needs related to disease education and support for treatment management of Rheumatoid Arthritis    Goal Met . New 12/20/19: Over the next 120 days the patient will notify Rheumatologist with any increased stiffness or joint pain as indicated in more recent office visit notes . New 12/20/19- Over the next 120 days the patient will work with care management team to address barriers related to disease education and management of Rheumatoid Arthritis  CCM SW Interventions: Completed 12/20/19 with [name] . Comprehensive chart review performed . Noted recent office visit to see Rheumatologist on 11/13/19 o Visit notes reviewed o Patient doing well on Enbrel 50 mg SQ injections every 7 days o No RA flares noted during OV o Intermittent stiffness continues in both hands- patient continuing to wear arthritic gloves daily o Patient encouraged to notify Rheumatology office if increased joint pain or stiffness is experienced o Patient to follow up in approximately 5 months . Successful outbound call placed to [facility] to review goal progression . Confirmed patient continuing to adhere to medication regimen as outlined above by Rheumatologist . Discussed patient next scheduled appointment planned for 04/17/20 . Encouraged [name] to call Rheumatology office for a sooner appointment as needed . Collaboration with RN Care Manager, [place] to provide an update on goal progression  and plan   CCM RN CM Interventions:  08/05/19 call completed with daughter [name] . Evaluation of current treatment plan related to Rheumatoid Arthritis and patient's adherence to plan as established by provider. . Reviewed medications with patient and discussed indication, dosage and frequency of patient's prescribed RA treatment; determined patient's son is injecting her Enbrel without issues or concerns . Discussed plans with patient for ongoing care management follow up and provided patient with direct contact information for care management team . Determined Dr. Estanislado Pandy is managing patient's RA and currently her symptoms are stable . Discussed next f/u with Dr. Estanislado Pandy is scheduled for 08/15/19 _0 :20 AM; discussed daughter was planning to attend but is expecting inclement weather in her home state; discussed she will contact the office to arrange a coordinated virtual visit with her brother and or she will reschedule  Patient Self Care Activities:  . Unable to independently take medications as prescribed . Unable to self administer medications as prescribed  Please see past updates related to this goal by clicking on the "Past Updates" button in the selected goal      . I need to manage my thyroid condition     Current Barriers:  [name] Knowledge Deficits related to disease state management of hypothyroidism  Pharmacist Clinical Goal(s):  [name] Over the next 90 days, patient will demonstrate Improved medication adherence as evidenced by starting to take thyroid medication in the morning separate as prescribed  Goal out of date . 12/20/19 New: Over the next 45 days the patient will adhere to medication regimen as discussed with patient primary provider  CCM SW Interventions Completed 12/20/19 . Performed chart review o Noted recent OV on 12/03/19 where the patients provider noted a slight over active thyroid  o Medication change to Synthroid Monday - Saturday and hold on Sunday o Patient to  return in 4 weeks to  recheck labs . Successful outbound call placed to the patients daughter [name] to review medication regimen . Confirmed patient compliant with medication changes o Patient son continues to assist with medication dosing . Discussed follow up appointment planned for 6/23 . Collaboration with RN Care Manager [place] regarding goal progression and changes to medication regimen  Interventions: . Comprehensive medication review performed. . Counseled daughter to encourage patient to take thyroid medication in the mornings (30-60 min separate from food and other medications). . Explained that patient's thyroid levels have been variable.  Explained that compliance can be an issue.  We are hopeful pill packs will help with this situation . TSH=78 . Discussed with PCP, who may want to initiate brand name Synthroid for more steady levels . Will initiate brand name Synthroid next month . Will follow up  Patient Self Care Activities:  . Unable to independently take medications as prescribed . Unable to self administer medications as prescribed  Please see past updates related to this goal by clicking on the "Past Updates" button in the selected goal      . I would like to manage my mother's blood pressure     Current Barriers:  [name] Knowledge Deficits related to disease process and Self Health management of HTN . Chronic Disease Management support and education needs related to Hypertension, Rheumatoid Arthritis, Vitamin D deficiency, Acquired Hypothyroidism, Memory Loss  . Knowledge Deficits related to disease state management of blood pressure and Non Adherence to prescribed medication regimen . Latest blood pressure reading 126/80 at last PCP visit (was 162/100 at previous [place]) . Scr 0.71 to 1.49 . Current anti-hypertensives: lisinopril . Per PCP notes in 05/2019: o This is a chronic problem. The current episode started more than 1 year ago. The problem is  unchanged. The problem is uncontrolled. Pertinent negatives include no anxiety or headaches. Agents associated with hypertension include thyroid hormones. Risk factors for coronary artery disease include sedentary lifestyle. Past treatments include ACE inhibitors. There are no compliance problems.  There is no history of angina. Identifiable causes of hypertension include a thyroid problem.   Pharmacist Clinical Goal(s):  [name] Over the next 90 days, patient will work with CCM team and PCP to address needs related to optimized medication management of blood pressure . New 11/14/2019- Over the next 60 days the patient will work with care management team and PCP to better manage HTN as evidenced by no ED visits  Interventions: . Comprehensive medication review performed. . Pill packs to aid in compliance.  Daughter to come in to town mid-December to check pill packaging compliance . Patient is not taking lisinopril as prescribed. She is unable to check BP independently, however has a caregiver that can assist with    . Follow Scr 0.71 to 1.49.  Discussed with PCP as patient is on ACEi  CCM RN CM Interventions:  08/05/19 call completed with daughter [name] . Evaluation of current treatment plan related to Hypertension and patient's adherence to plan as established by provider. . Provided education to patient re: target BP for patient is 130/80 or less than; discussed importance of medication adherence and sodium restriction . Reviewed medications with patient and discussed patient adherence has improved since switching to the pill package system; patient's medication administration is supervised by CNA and son . Discussed plans with patient for ongoing care management follow up and provided patient with direct contact information for care management team . Advised patient, providing education and rationale, to monitor blood  pressure daily and record, calling the CCM team and or PCP for findings outside  established parameters.  . Provided patient with printed educational materials related to What is High Blood Pressure; Why Should I restrict Sodium; African Americans and High Blood Pressure; Life's Simple 7   CCM SW Interventions Completed 11/14/2019 . Chart reviewed to note patient seen in ED on 11/13/2019 due to reported high blood pressure of 200/104 during rheumatology visit . Successful outbound call placed to the patient to assist with acute care coordination needs . Reviewed discharge instructions to add Amlodipine to daily medication regimen . Determined the patient has yet to begin medication but acknowledges the pharmacy is to deliver medication "today" . Encouraged the patient to contact primary care team as needed . Advised the patient to expect a call from [place] over the next week . Collaboration with RN Care Manager regarding recent ED visit and current disposition  Patient Self Care Activities:  . Unable to independently take medications and blood pressure readings as prescribed . Unable to self administer medications as prescribed  Please see past updates related to this goal by clicking on the "Past Updates" button in the selected goal      . Patient Stated     No goals    . Patient Stated     01/15/2020, no goals      Depression Screen PHQ 2/9 Scores 01/15/2020 06/24/2019 03/14/2019 02/20/2019 01/08/2019 08/13/2018  PHQ - 2 Score 3 0 0 1 0 0  PHQ- 9 Score 21 - - - - -  Exception Documentation Other- indicate reason in comment box - - - - -  Not completed completed by CMA - - - - -    Fall Risk Fall Risk  01/15/2020 06/24/2019 03/14/2019 02/20/2019 01/08/2019  Falls in the past year? 1 0 0 0 1  Comment lost balance - - - -  Number falls in past yr: 0 - - - 0  Comment - - - - tripped over something  Injury with Fall? 1 - - - -  Comment sutures over right eye - - - -  Risk for fall due to : History of fall(s);Impaired balance/gait;Medication side  effect;Impaired vision - - - Medication side effect;Impaired balance/gait;Impaired vision  Follow up Falls evaluation completed;Education provided;Falls prevention discussed - - - Falls prevention discussed    Any stairs in or around the home? Yes  If so, are there any without handrails? no Home free of loose throw rugs in walkways, pet beds, electrical cords, etc? Yes  Adequate lighting in your home to reduce risk of falls? Yes   ASSISTIVE DEVICES UTILIZED TO PREVENT FALLS:  Life alert? No  Use of a cane, walker or w/c? Yes  Grab bars in the bathroom? No  Shower chair or bench in shower? Yes  Elevated toilet seat or a handicapped toilet? Yes   TIMED UP AND GO:  Was the test performed? No .  .   Gait slow and steady with assistive device  Cognitive Function: MMSE - Mini Mental State Exam 07/13/2017  Orientation to time 3  Orientation to Place 5  Registration 3  Attention/ Calculation 5  Recall 0  Language- name 2 objects 2  Language- repeat 1  Language- follow 3 step command 3  Language- read & follow direction 1  Write a sentence 1  Copy design 1  Total score 25     6CIT Screen 01/08/2019  What Year? 0 points  What month?  0 points  What time? 3 points  Count back from 20 2 points  Months in reverse 4 points  Repeat phrase 10 points  Total Score 19    Immunizations Immunization History  Administered Date(s) Administered  . Moderna SARS-COVID-2 Vaccination 09/26/2019, 10/24/2019    TDAP status: Due, Education has been provided regarding the importance of this vaccine. Advised may receive this vaccine at local pharmacy or Health Dept. Aware to provide a copy of the vaccination record if obtained from local pharmacy or Health Dept. Verbalized acceptance and understanding. Flu Vaccine status: Declined, Education has been provided regarding the importance of this vaccine but patient still declined. Advised may receive this vaccine at local pharmacy or Health Dept.  Aware to provide a copy of the vaccination record if obtained from local pharmacy or Health Dept. Verbalized acceptance and understanding. Pneumococcal vaccine status: Declined,  Education has been provided regarding the importance of this vaccine but patient still declined. Advised may receive this vaccine at local pharmacy or Health Dept. Aware to provide a copy of the vaccination record if obtained from local pharmacy or Health Dept. Verbalized acceptance and understanding.  Covid-19 vaccine status: Completed vaccines  Qualifies for Shingles Vaccine? yes  Zostavax completed No   Shingrix Completed?: No.    Education has been provided regarding the importance of this vaccine. Patient has been advised to call insurance company to determine out of pocket expense if they have not yet received this vaccine. Advised may also receive vaccine at local pharmacy or Health Dept. Verbalized acceptance and understanding.  Screening Tests Health Maintenance  Topic Date Due  . TETANUS/TDAP  Never done  . PNA vac Low Risk Adult (1 of 2 - PCV13) Never done  . INFLUENZA VACCINE  02/23/2020  . DEXA SCAN  Completed  . COVID-19 Vaccine  Completed    Health Maintenance  Health Maintenance Due  Topic Date Due  . TETANUS/TDAP  Never done  . PNA vac Low Risk Adult (1 of 2 - PCV13) Never done    Colorectal cancer screening: No longer required.  Mammogram status: No longer required.  Bone Density status: Completed 06/19/2017.  Lung Cancer Screening: (Low Dose CT Chest recommended if Age 73-80 years, 30 pack-year currently smoking OR have quit w/in 15years.) does not qualify.   Lung Cancer Screening Referral: no  Additional Screening:  Hepatitis C Screening: does not qualify; Completed   Vision Screening: Recommended annual ophthalmology exams for early detection of glaucoma and other disorders of the eye. Is the patient up to date with their annual eye exam?  No  Who is the provider or what is the  name of the office in which the patient attends annual eye exams? Dr. Herma Ard If pt is not established with a provider, would they like to be referred to a provider to establish care? No .   Dental Screening: Recommended annual dental exams for proper oral hygiene  Community Resource Referral / Chronic Care Management: CRR required this visit?  No   CCM required this visit?  No      Plan:     I have personally reviewed and noted the following in the patient's chart:   . Medical and social history . Use of alcohol, tobacco or illicit drugs  . Current medications and supplements . Functional ability and status . Nutritional status . Physical activity . Advanced directives . List of other physicians . Hospitalizations, surgeries, and ER visits in previous 12 months . Vitals . Screenings to  include cognitive, depression, and falls . Referrals and appointments  In addition, I have reviewed and discussed with patient certain preventive protocols, quality metrics, and best practice recommendations. A written personalized care plan for preventive services as well as general preventive health recommendations were provided to patient.     Kellie Simmering, LPN   [id number]   Nurse Notes: Complaints of pain over the right eye where she fell seeing PCP after this visit. 6 CIT not completed. Patient has diagnosis of Alzheimer's.

## 2020-01-15 NOTE — Patient Instructions (Signed)
Ms. Qazi , Thank you for taking time to come for your Medicare Wellness Visit. I appreciate your ongoing commitment to your health goals. Please review the following plan we discussed and let me know if I can assist you in the future.   Screening recommendations/referrals: Colonoscopy: not required Mammogram: not required Bone Density: completed 06/19/2017 Recommended yearly ophthalmology/optometry visit for glaucoma screening and checkup Recommended yearly dental visit for hygiene and checkup  Vaccinations: Influenza vaccine: decline Pneumococcal vaccine: decline Tdap vaccine: decline Shingles vaccine: discussed   Covid-19:10/24/2019, 09/26/2019  Advanced directives: Please bring a copy of your POA (Power of Attorney) and/or Living Will to your next appointment.   Conditions/risks identified: legally blind  Next appointment: 03/24/2020 at 9:00  Follow up in one year for your annual wellness visit    Preventive Care 65 Years and Older, Female Preventive care refers to lifestyle choices and visits with your health care provider that can promote health and wellness. What does preventive care include?  A yearly physical exam. This is also called an annual well check.  Dental exams once or twice a year.  Routine eye exams. Ask your health care provider how often you should have your eyes checked.  Personal lifestyle choices, including:  Daily care of your teeth and gums.  Regular physical activity.  Eating a healthy diet.  Avoiding tobacco and drug use.  Limiting alcohol use.  Practicing safe sex.  Taking low-dose aspirin every day.  Taking vitamin and mineral supplements as recommended by your health care provider. What happens during an annual well check? The services and screenings done by your health care provider during your annual well check will depend on your age, overall health, lifestyle risk factors, and family history of disease. Counseling  Your health  care provider may ask you questions about your:  Alcohol use.  Tobacco use.  Drug use.  Emotional well-being.  Home and relationship well-being.  Sexual activity.  Eating habits.  History of falls.  Memory and ability to understand (cognition).  Work and work Astronomer.  Reproductive health. Screening  You may have the following tests or measurements:  Height, weight, and BMI.  Blood pressure.  Lipid and cholesterol levels. These may be checked every 5 years, or more frequently if you are over 61 years old.  Skin check.  Lung cancer screening. You may have this screening every year starting at age 56 if you have a 30-pack-year history of smoking and currently smoke or have quit within the past 15 years.  Fecal occult blood test (FOBT) of the stool. You may have this test every year starting at age 48.  Flexible sigmoidoscopy or colonoscopy. You may have a sigmoidoscopy every 5 years or a colonoscopy every 10 years starting at age 68.  Hepatitis C blood test.  Hepatitis B blood test.  Sexually transmitted disease (STD) testing.  Diabetes screening. This is done by checking your blood sugar (glucose) after you have not eaten for a while (fasting). You may have this done every 1-3 years.  Bone density scan. This is done to screen for osteoporosis. You may have this done starting at age 31.  Mammogram. This may be done every 1-2 years. Talk to your health care provider about how often you should have regular mammograms. Talk with your health care provider about your test results, treatment options, and if necessary, the need for more tests. Vaccines  Your health care provider may recommend certain vaccines, such as:  Influenza vaccine. This is recommended  every year.  Tetanus, diphtheria, and acellular pertussis (Tdap, Td) vaccine. You may need a Td booster every 10 years.  Zoster vaccine. You may need this after age 39.  Pneumococcal 13-valent conjugate  (PCV13) vaccine. One dose is recommended after age 16.  Pneumococcal polysaccharide (PPSV23) vaccine. One dose is recommended after age 70. Talk to your health care provider about which screenings and vaccines you need and how often you need them. This information is not intended to replace advice given to you by your health care provider. Make sure you discuss any questions you have with your health care provider. Document Released: 08/07/2015 Document Revised: 03/30/2016 Document Reviewed: 05/12/2015 Elsevier Interactive Patient Education  2017 [place] Prevention in the Home Falls can cause injuries. They can happen to people of all ages. There are many things you can do to make your home safe and to help prevent falls. What can I do on the outside of my home?  Regularly fix the edges of walkways and driveways and fix any cracks.  Remove anything that might make you trip as you walk through a door, such as a raised step or threshold.  Trim any bushes or trees on the path to your home.  Use bright outdoor lighting.  Clear any walking paths of anything that might make someone trip, such as rocks or tools.  Regularly check to see if handrails are loose or broken. Make sure that both sides of any steps have handrails.  Any raised decks and porches should have guardrails on the edges.  Have any leaves, snow, or ice cleared regularly.  Use sand or salt on walking paths during winter.  Clean up any spills in your garage right away. This includes oil or grease spills. What can I do in the bathroom?  Use night lights.  Install grab bars by the toilet and in the tub and shower. Do not use towel bars as grab bars.  Use non-skid mats or decals in the tub or shower.  If you need to sit down in the shower, use a plastic, non-slip stool.  Keep the floor dry. Clean up any water that spills on the floor as soon as it happens.  Remove soap buildup in the tub or shower  regularly.  Attach bath mats securely with double-sided non-slip rug tape.  Do not have throw rugs and other things on the floor that can make you trip. What can I do in the bedroom?  Use night lights.  Make sure that you have a light by your bed that is easy to reach.  Do not use any sheets or blankets that are too big for your bed. They should not hang down onto the floor.  Have a firm chair that has side arms. You can use this for support while you get dressed.  Do not have throw rugs and other things on the floor that can make you trip. What can I do in the kitchen?  Clean up any spills right away.  Avoid walking on wet floors.  Keep items that you use a lot in easy-to-reach places.  If you need to reach something above you, use a strong step stool that has a grab bar.  Keep electrical cords out of the way.  Do not use floor polish or wax that makes floors slippery. If you must use wax, use non-skid floor wax.  Do not have throw rugs and other things on the floor that can make you trip. What  can I do with my stairs?  Do not leave any items on the stairs.  Make sure that there are handrails on both sides of the stairs and use them. Fix handrails that are broken or loose. Make sure that handrails are as long as the stairways.  Check any carpeting to make sure that it is firmly attached to the stairs. Fix any carpet that is loose or worn.  Avoid having throw rugs at the top or bottom of the stairs. If you do have throw rugs, attach them to the floor with carpet tape.  Make sure that you have a light switch at the top of the stairs and the bottom of the stairs. If you do not have them, ask someone to add them for you. What else can I do to help prevent falls?  Wear shoes that:  Do not have high heels.  Have rubber bottoms.  Are comfortable and fit you well.  Are closed at the toe. Do not wear sandals.  If you use a stepladder:  Make sure that it is fully  opened. Do not climb a closed stepladder.  Make sure that both sides of the stepladder are locked into place.  Ask someone to hold it for you, if possible.  Clearly mark and make sure that you can see:  Any grab bars or handrails.  First and last steps.  Where the edge of each step is.  Use tools that help you move around (mobility aids) if they are needed. These include:  Canes.  Walkers.  Scooters.  Crutches.  Turn on the lights when you go into a dark area. Replace any light bulbs as soon as they burn out.  Set up your furniture so you have a clear path. Avoid moving your furniture around.  If any of your floors are uneven, fix them.  If there are any pets around you, be aware of where they are.  Review your medicines with your doctor. Some medicines can make you feel dizzy. This can increase your chance of falling. Ask your doctor what other things that you can do to help prevent falls. This information is not intended to replace advice given to you by your health care provider. Make sure you discuss any questions you have with your health care provider. Document Released: 05/07/2009 Document Revised: 12/17/2015 Document Reviewed: 08/15/2014 Elsevier Interactive Patient Education  2017 [employer].

## 2020-01-16 ENCOUNTER — Telehealth: Payer: Self-pay

## 2020-01-16 LAB — T4: T4, Total: 10.2 ug/dL (ref 4.5–12.0)

## 2020-01-16 LAB — TSH: TSH: 0.132 u[IU]/mL — ABNORMAL LOW (ref 0.450–4.500)

## 2020-01-16 LAB — T3, FREE: T3, Free: 2.6 pg/mL (ref 2.0–4.4)

## 2020-01-16 NOTE — Patient Instructions (Signed)
Visit Information  Goals Addressed    . "To keep mother's RA well managed"   On track    Daughter stated Current Barriers:  [name] Knowledge Deficits related to disease process and Self Health management of RA . Chronic Disease Management support and education needs related to HTN, RA, Vitamin D deficiency, Acquired Hypothyroidism , Memory Loss   Nurse Case Manager Clinical Goal(s):  [name] Over the next 90 days, patient will work with the CCM team to address needs related to disease education and support for treatment management of Rheumatoid Arthritis    Goal Met . New 12/20/19: Over the next 120 days the patient will notify Rheumatologist with any increased stiffness or joint pain as indicated in more recent office visit notes . New 12/20/19- Over the next 120 days the patient will work with care management team to address barriers related to disease education and management of Rheumatoid Arthritis  CCM RN CM Interventions:  01/15/20 call completed with daughter [name] . Evaluation of current treatment plan related to Rheumatoid Arthritis and patient's adherence to plan as established by provider . Determined patient's RA is stable at this time; Reviewed medications with patient and discussed indication, dosage and frequency of patient's prescribed RA treatment; determined patient's son is injecting her Enbrel without issues or concerns . Determined patient completed her last Rheumatology visit on 11/13/19 with Hazel Sams PA with the following Assessment/Plan noted;  o Assessment / Plan:     o Visit Diagnoses: Rheumatoid arthritis involving multiple sites with positive rheumatoid factor ([place]): She has no synovitis on exam.  She has not had any recent rheumatoid arthritis flares.  She is clinically doing well on Enbrel 50 mg subcutaneous injections every 7 days.  She has not missed any doses of Enbrel recently.  She continues to experience intermittent stiffness and discomfort in both hands and wears  arthritis gloves on a daily basis.  She experiences stiffness in both knee replacements but no warmth or effusion was noted on exam today.  She will continue on Enbrel 50 mg subcu injections once weekly.  She was advised to notify us if she develops increased joint pain or joint swelling.  She will follow-up in the office in 5 months. o High risk medication use - Enbrel Sureclick 50 mg every 7 days.  CBC and CMP were drawn on 06/14/19.  TB gold negative on 06/14/2019.  She is due to update CBC and CMP today.  She will be due for lab work in July and every 3 months to monitor for drug toxicity.  Standing orders for CBC and CMP were placed today.- Plan: CBC with Differential/Platelet, COMPLETE METABOLIC PANEL WITH GFR o H/O total knee replacement, bilateral: She experiences discomfort and stiffness in both knee replacements especially after sitting for prolonged periods of time. She uses a cane to assist with ambulation. o DDD (degenerative disc disease), lumbar: She experiences intermittent discomfort and stiffness in her lower back.  No symptoms of radiculopathy.  o Age-related osteoporosis without current pathological fracture - Scan on 06/19/2017 showed T-score of -3 at left femur neck and no baseline for comparison of BMD change. Future order for DEXA in place.  o Essential hypertension: Patient presents today with a severe elevation in her blood pressure.  Her blood pressure was 200/104 (manual). According to the patient she takes lisinopril 30 mg 1 tablet daily. She is currently experiencing a headache. Patient was instructed to report to the ED for further evaluation of her BP. (See hypertension goal) .  Discussed plans with patient for ongoing care management follow up and provided patient with direct contact information for care management team  Patient Self Care Activities:  . Unable to independently take medications as prescribed . Unable to self administer medications as prescribed  Please see  past updates related to this goal by clicking on the "Past Updates" button in the selected goal      . I need to manage my thyroid condition   Not on track    Current Barriers:  [name] Knowledge Deficits related to disease state management of hypothyroidism  Pharmacist Clinical Goal(s):  [name] Over the next 90 days, patient will demonstrate Improved medication adherence as evidenced by starting to take thyroid medication in the morning separate as prescribed  Goal out of date . 12/20/19 New: Over the next 45 days the patient will adhere to medication regimen as discussed with patient primary provider  CCM RN CM Interventions: 01/15/20 call completed with patient's daughter [name] . Determined patient's TSH remains to be abnormally low; Educated daughter of importance of taking thyroid medication in the mornings (30-60 min separate from food and other medications). . Confirmed patient is taking her Synthroid as directed w/o missed doses  . Mailed printed patient educational materials to daughter/patient related to Thyroid disorders  . Discussed plans with patient for ongoing care management follow up and provided patient with direct contact information for care management team  Patient Self Care Activities:  . Unable to independently take medications as prescribed . Unable to self administer medications as prescribed  Please see past updates related to this goal by clicking on the "Past Updates" button in the selected goal      . I would like to manage my mother's blood pressure   On track    Current Barriers:  [name] Knowledge Deficits related to disease process and Self Health management of HTN . Chronic Disease Management support and education needs related to Hypertension, Rheumatoid Arthritis, Vitamin D deficiency, Acquired Hypothyroidism, Memory Loss   Pharmacist Clinical Goal(s):  [name] Over the next 90 days, patient will work with CCM team and PCP to address needs related to optimized medication  management of blood pressure . New 11/14/2019- Over the next 60 days the patient will work with care management team and PCP to better manage HTN as evidenced by no ED visits  CCM RN CM Interventions:  01/15/20 call completed with daughter [name] . Evaluation of current treatment plan related to Hypertension and patient's adherence to plan as established by provider . Determined patient had an ED visit on 11/13/19 to [facility] for hypertension; weakness with the following Assessment/Plan noted: o 86 year old female with hypertension.  Pretty minimal complaints otherwise.  Exam is nonfocal.  We will put her on Norvasc.  Needs to keep a log of blood pressure.  Follow-up in about a week or 2 for recheck.  Emergent return cautions discussed with patient and son at bedside. o Final Clinical Impression(s) / ED Diagnoses o Final diagnoses: o  Hypertension, unspecified type   . Determined on 12/03/19 post ED f/u with PCP provider [name], FNP, patient's BP was noted to be 110/70, HR 61 . Discussed home BP's are within target range since adding Norvasc, in addition, patient is following a low Sodium diet  . Advised patient, providing education and rationale, to monitor blood pressure daily and record, calling the CCM team and or PCP for findings outside established parameters . Discussed plans with patient for ongoing care  management follow up and provided patient with direct contact information for care management team  Patient Self Care Activities:  . Unable to independently take medications and blood pressure readings as prescribed . Unable to self administer medications as prescribed  Please see past updates related to this goal by clicking on the "Past Updates" button in the selected goal         Patient verbalizes understanding of instructions provided today.   Telephone follow up appointment with care management team member scheduled for: 02/27/20  Barb Merino, RN,  BSN, CCM Care Management Coordinator [facility]  Direct Phone: [phone]

## 2020-01-16 NOTE — Chronic Care Management (AMB) (Signed)
Chronic Care Management   Follow Up Note   01/15/2020 Name: Jordan [name] MRN: [id number] DOB: 1933-11-14  Referred by: [name], FNP Reason for referral : Chronic Care Management (FU RN CM Call -post d/c -- BP/fall )   Jordan [name] is a 86 y.o. year old female who is a primary care patient of [name], [place]. The CCM team was consulted for assistance with chronic disease management and care coordination needs.    Review of patient status, including review of consultants reports, relevant laboratory and other test results, and collaboration with appropriate care team members and the patient's provider was performed as part of comprehensive patient evaluation and provision of chronic care management services.    SDOH (Social Determinants of Health) assessments performed: Yes - no acute needs See Care Plan activities for detailed interventions related to [place])   Placed outbound call to daughter [name] for a CCM RN CM care plan update.     Outpatient Encounter Medications as of 01/14/2020  Medication Sig  . amLODipine (NORVASC) 5 MG tablet Take 1 tablet (5 mg total) by mouth daily.  [name] etanercept (ENBREL SURECLICK) 50 MG/ML injection Inject 0.98 mLs (50 mg total) into the skin once a week.  [name] lisinopril (ZESTRIL) 30 MG tablet TAKE 1 TABLET BY MOUTH EVERY DAY  . SYNTHROID 100 MCG tablet TAKE 1 TABLET BY MOUTH EVERY DAY BEFORE BREAKFAST  . albuterol (VENTOLIN HFA) 108 (90 Base) MCG/ACT inhaler Inhale 1 puff into the lungs every 6 (six) hours as needed for wheezing or shortness of breath. (Patient not taking: Reported on 12/03/2019)  . ALLERGY RELIEF 10 MG tablet TAKE 1 TABLET BY MOUTH EVERY DAY  . atropine 1 % ophthalmic solution INSTILL 1 GTT INTO OS D  . brimonidine-timolol (COMBIGAN) 0.2-0.5 % ophthalmic solution Place 1 drop into both eyes every 12 (twelve) hours.  . fluticasone (FLONASE) 50 MCG/ACT nasal spray INSTILL 2 SPRAYS IN EACH NOSTRIL EVERY DAY  . folic acid  (FOLVITE) 1 MG tablet TAKE 1 TABLET(1 MG) BY MOUTH DAILY  . latanoprost (XALATAN) 0.005 % ophthalmic solution Place 1 drop into both eyes nightly.  [name] NAMZARIC 28-10 MG CP24 TAKE 1 CAPSULE BY MOUTH EVERY DAY  . omeprazole (PRILOSEC) 20 MG capsule TAKE 1 CAPSULE BY MOUTH EVERY DAY  . simvastatin (ZOCOR) 40 MG tablet TAKE 1 TABLET BY MOUTH AT BEDTIME (Patient taking differently: Every other day.)   No facility-administered encounter medications on file as of 01/14/2020.     Objective:  No results found for: HGBA1C Lab Results  Component Value Date   MICROALBUR 30 02/21/2019   CREATININE 0.69 11/13/2019   BP Readings from Last 3 Encounters:  01/15/20 110/68  01/15/20 110/68  12/04/19 (!) 176/77    Goals Addressed    . "To keep mother's RA well managed"   On track    Daughter stated Current Barriers:  [name] Knowledge Deficits related to disease process and Self Health management of RA . Chronic Disease Management support and education needs related to HTN, RA, Vitamin D deficiency, Acquired Hypothyroidism , Memory Loss   Nurse Case Manager Clinical Goal(s):  [name] Over the next 90 days, patient will work with the CCM team to address needs related to disease education and support for treatment management of Rheumatoid Arthritis    Goal Met . New 12/20/19: Over the next 120 days the patient will notify Rheumatologist with any increased stiffness or joint pain as indicated in more recent office visit notes . New 12/20/19-  Over the next 120 days the patient will work with care management team to address barriers related to disease education and management of Rheumatoid Arthritis  CCM RN CM Interventions:  01/15/20 call completed with daughter [name] . Evaluation of current treatment plan related to Rheumatoid Arthritis and patient's adherence to plan as established by provider . Determined patient's RA is stable at this time; Reviewed medications with patient and discussed indication, dosage and  frequency of patient's prescribed RA treatment; determined patient's son is injecting her Enbrel without issues or concerns . Determined patient completed her last Rheumatology visit on 11/13/19 with Hazel Sams PA with the following Assessment/Plan noted;  o Assessment / Plan:     o Visit Diagnoses: Rheumatoid arthritis involving multiple sites with positive rheumatoid factor ([place]): She has no synovitis on exam.  She has not had any recent rheumatoid arthritis flares.  She is clinically doing well on Enbrel 50 mg subcutaneous injections every 7 days.  She has not missed any doses of Enbrel recently.  She continues to experience intermittent stiffness and discomfort in both hands and wears arthritis gloves on a daily basis.  She experiences stiffness in both knee replacements but no warmth or effusion was noted on exam today.  She will continue on Enbrel 50 mg subcu injections once weekly.  She was advised to notify us if she develops increased joint pain or joint swelling.  She will follow-up in the office in 5 months. o High risk medication use - Enbrel Sureclick 50 mg every 7 days.  CBC and CMP were drawn on 06/14/19.  TB gold negative on 06/14/2019.  She is due to update CBC and CMP today.  She will be due for lab work in July and every 3 months to monitor for drug toxicity.  Standing orders for CBC and CMP were placed today.- Plan: CBC with Differential/Platelet, COMPLETE METABOLIC PANEL WITH GFR o H/O total knee replacement, bilateral: She experiences discomfort and stiffness in both knee replacements especially after sitting for prolonged periods of time. She uses a cane to assist with ambulation. o DDD (degenerative disc disease), lumbar: She experiences intermittent discomfort and stiffness in her lower back.  No symptoms of radiculopathy.  o Age-related osteoporosis without current pathological fracture - Scan on 06/19/2017 showed T-score of -3 at left femur neck and no baseline for comparison of BMD  change. Future order for DEXA in place.  o Essential hypertension: Patient presents today with a severe elevation in her blood pressure.  Her blood pressure was 200/104 (manual). According to the patient she takes lisinopril 30 mg 1 tablet daily. She is currently experiencing a headache. Patient was instructed to report to the ED for further evaluation of her BP. (See hypertension goal) . Discussed plans with patient for ongoing care management follow up and provided patient with direct contact information for care management team  Patient Self Care Activities:  . Unable to independently take medications as prescribed . Unable to self administer medications as prescribed  Please see past updates related to this goal by clicking on the "Past Updates" button in the selected goal      . I need to manage my thyroid condition   Not on track    Current Barriers:  [name] Knowledge Deficits related to disease state management of hypothyroidism  Pharmacist Clinical Goal(s):  [name] Over the next 90 days, patient will demonstrate Improved medication adherence as evidenced by starting to take thyroid medication in the morning separate as prescribed  Goal  out of date . 12/20/19 New: Over the next 45 days the patient will adhere to medication regimen as discussed with patient primary provider  CCM RN CM Interventions: 01/15/20 call completed with patient's daughter [name] . Determined patient's TSH remains to be abnormally low; Educated daughter of importance of taking thyroid medication in the mornings (30-60 min separate from food and other medications). . Confirmed patient is taking her Synthroid as directed w/o missed doses  . Mailed printed patient educational materials to daughter/patient related to Thyroid disorders  . Discussed plans with patient for ongoing care management follow up and provided patient with direct contact information for care management team  Patient Self Care Activities:  . Unable to  independently take medications as prescribed . Unable to self administer medications as prescribed  Please see past updates related to this goal by clicking on the "Past Updates" button in the selected goal      . I would like to manage my mother's blood pressure   On track    Current Barriers:  [name] Knowledge Deficits related to disease process and Self Health management of HTN . Chronic Disease Management support and education needs related to Hypertension, Rheumatoid Arthritis, Vitamin D deficiency, Acquired Hypothyroidism, Memory Loss   Pharmacist Clinical Goal(s):  [name] Over the next 90 days, patient will work with CCM team and PCP to address needs related to optimized medication management of blood pressure . New 11/14/2019- Over the next 60 days the patient will work with care management team and PCP to better manage HTN as evidenced by no ED visits  CCM RN CM Interventions:  01/15/20 call completed with daughter [name] . Evaluation of current treatment plan related to Hypertension and patient's adherence to plan as established by provider . Determined patient had an ED visit on 11/13/19 to [facility] for hypertension; weakness with the following Assessment/Plan noted: o 86 year old female with hypertension.  Pretty minimal complaints otherwise.  Exam is nonfocal.  We will put her on Norvasc.  Needs to keep a log of blood pressure.  Follow-up in about a week or 2 for recheck.  Emergent return cautions discussed with patient and son at bedside. o Final Clinical Impression(s) / ED Diagnoses o Final diagnoses: o  Hypertension, unspecified type   . Determined on 12/03/19 post ED f/u with PCP provider [name], FNP, patient's BP was noted to be 110/70, HR 61 . Discussed home BP's are within target range since adding Norvasc, in addition, patient is following a low Sodium diet  . Advised patient, providing education and rationale, to monitor blood pressure daily and record,  calling the CCM team and or PCP for findings outside established parameters . Discussed plans with patient for ongoing care management follow up and provided patient with direct contact information for care management team  Patient Self Care Activities:  . Unable to independently take medications and blood pressure readings as prescribed . Unable to self administer medications as prescribed  Please see past updates related to this goal by clicking on the "Past Updates" button in the selected goal        Plan:   Telephone follow up appointment with care management team member scheduled for: 02/27/20  Barb Merino, RN, BSN, CCM Care Management Coordinator [facility]  Direct Phone: [phone]

## 2020-01-20 ENCOUNTER — Other Ambulatory Visit: Payer: Self-pay

## 2020-01-20 MED ORDER — DICLOFENAC SODIUM 1 % EX GEL: 2.0000 g | Freq: Four times a day (QID) | CUTANEOUS | 3 refills | Status: DC

## 2020-01-23 ENCOUNTER — Other Ambulatory Visit: Payer: Self-pay | Admitting: Rheumatology

## 2020-02-24 ENCOUNTER — Other Ambulatory Visit: Payer: Self-pay | Admitting: Rheumatology

## 2020-02-24 NOTE — Telephone Encounter (Signed)
Labs are due now.  Okay to give Enbrel for 30-day supply.

## 2020-02-24 NOTE — Telephone Encounter (Signed)
Patient's daughter advised patient is due for labs. She states she will bring patient in one day this week.

## 2020-02-24 NOTE — Telephone Encounter (Signed)
Last Visit: 11/13/2019 Next Visit: 04/17/2020 Labs: 11/13/2019 CBC stable. CMP WNL.  TB Gold: 06/14/2019 Neg   Current Dose per office note 11/13/2019: Enbrel Sureclick 50 mg every 7 days  TD:DUKGURKYHC arthritis involving multiple sites with positive rheumatoid factor

## 2020-02-27 ENCOUNTER — Encounter: Payer: Medicare Other | Admitting: Nurse Practitioner

## 2020-02-27 ENCOUNTER — Telehealth: Payer: Self-pay

## 2020-03-03 ENCOUNTER — Other Ambulatory Visit: Payer: Self-pay

## 2020-03-03 MED ORDER — MIRTAZAPINE 30 MG PO TABS: 30.0000 mg | ORAL_TABLET | Freq: Every day | ORAL | 0 refills | Status: DC

## 2020-03-03 NOTE — Progress Notes (Signed)
Not seen only needed labs

## 2020-03-04 ENCOUNTER — Other Ambulatory Visit: Payer: Self-pay | Admitting: Rheumatology

## 2020-03-04 ENCOUNTER — Other Ambulatory Visit: Payer: Self-pay

## 2020-03-04 DIAGNOSIS — Z79899 Other long term (current) drug therapy: Secondary | ICD-10-CM

## 2020-03-04 NOTE — Telephone Encounter (Signed)
Last Visit: 11/13/2019 Next Visit: 04/17/2020 Labs: 11/13/2019 CBC stable. CMP WNL.  TB Gold: 06/14/2019 Neg   Current Dose per office note 11/13/2019: Enbrel Sureclick 50 mg every 7 days  QD:UKRCVKFMMC arthritis involving multiple sites with positive rheumatoid factor   Patient updated labs today  Okay to refill Enbrel?

## 2020-03-05 LAB — COMPLETE METABOLIC PANEL WITH GFR
AG Ratio: 1.4 (calc) (ref 1.0–2.5)
ALT: 14 U/L (ref 6–29)
AST: 16 U/L (ref 10–35)
Albumin: 3.7 g/dL (ref 3.6–5.1)
Alkaline phosphatase (APISO): 68 U/L (ref 37–153)
BUN: 11 mg/dL (ref 7–25)
CO2: 24 mmol/L (ref 20–32)
Calcium: 8.9 mg/dL (ref 8.6–10.4)
Chloride: 104 mmol/L (ref 98–110)
Creat: 0.82 mg/dL (ref 0.60–0.88)
GFR, Est African American: 76 mL/min/{1.73_m2} (ref >=60)
GFR, Est Non African American: 66 mL/min/{1.73_m2} (ref >=60)
Globulin: 2.6 g/dL (calc) (ref 1.9–3.7)
Glucose, Bld: 112 mg/dL — ABNORMAL HIGH (ref 65–99)
Potassium: 4.2 mmol/L (ref 3.5–5.3)
Sodium: 137 mmol/L (ref 135–146)
Total Bilirubin: 0.6 mg/dL (ref 0.2–1.2)
Total Protein: 6.3 g/dL (ref 6.1–8.1)

## 2020-03-05 LAB — CBC WITH DIFFERENTIAL/PLATELET
Absolute Monocytes: 768 cells/uL (ref 200–950)
Basophils Absolute: 61 cells/uL (ref 0–200)
Basophils Relative: 0.8 %
Eosinophils Absolute: 53 cells/uL (ref 15–500)
Eosinophils Relative: 0.7 %
HCT: 34.2 % — ABNORMAL LOW (ref 35.0–45.0)
Hemoglobin: 10.8 g/dL — ABNORMAL LOW (ref 11.7–15.5)
Lymphs Abs: 3914 cells/uL — ABNORMAL HIGH (ref 850–3900)
MCH: 23.2 pg — ABNORMAL LOW (ref 27.0–33.0)
MCHC: 31.6 g/dL — ABNORMAL LOW (ref 32.0–36.0)
MCV: 73.5 fL — ABNORMAL LOW (ref 80.0–100.0)
Monocytes Relative: 10.1 %
Neutro Abs: 2804 cells/uL (ref 1500–7800)
Neutrophils Relative %: 36.9 %
Platelets: 173 10*3/uL (ref 140–400)
RBC: 4.65 10*6/uL (ref 3.80–5.10)
RDW: 18 % — ABNORMAL HIGH (ref 11.0–15.0)
Total Lymphocyte: 51.5 %
WBC: 7.6 10*3/uL (ref 3.8–10.8)

## 2020-03-05 NOTE — Progress Notes (Signed)
Anemia noted.  CMP is normal.  Please forward results to her PCP.  She may need evaluation for anemia.

## 2020-03-16 ENCOUNTER — Other Ambulatory Visit: Payer: Self-pay | Admitting: Nurse Practitioner

## 2020-03-24 ENCOUNTER — Observation Stay (HOSPITAL_COMMUNITY)
Admission: EM | Admit: 2020-03-24 | Discharge: 2020-03-26 | Disposition: A | Discharge status: Home / Self Care | Payer: Medicare Other | Attending: Family Medicine | Admitting: Family Medicine

## 2020-03-24 ENCOUNTER — Emergency Department (HOSPITAL_COMMUNITY): Payer: Medicare Other

## 2020-03-24 ENCOUNTER — Ambulatory Visit: Payer: Medicare Other | Admitting: Nurse Practitioner

## 2020-03-24 ENCOUNTER — Other Ambulatory Visit: Payer: Self-pay

## 2020-03-24 ENCOUNTER — Encounter: Payer: Medicare Other | Admitting: Nurse Practitioner

## 2020-03-24 ENCOUNTER — Encounter (HOSPITAL_COMMUNITY): Payer: Self-pay

## 2020-03-24 DIAGNOSIS — E860 Dehydration: Secondary | ICD-10-CM | POA: Diagnosis not present

## 2020-03-24 DIAGNOSIS — Z20822 Contact with and (suspected) exposure to covid-19: Secondary | ICD-10-CM | POA: Insufficient documentation

## 2020-03-24 DIAGNOSIS — R531 Weakness: Secondary | ICD-10-CM | POA: Diagnosis not present

## 2020-03-24 DIAGNOSIS — E039 Hypothyroidism, unspecified: Secondary | ICD-10-CM | POA: Diagnosis not present

## 2020-03-24 DIAGNOSIS — I6782 Cerebral ischemia: Secondary | ICD-10-CM | POA: Diagnosis not present

## 2020-03-24 DIAGNOSIS — I517 Cardiomegaly: Secondary | ICD-10-CM | POA: Diagnosis not present

## 2020-03-24 DIAGNOSIS — M47816 Spondylosis without myelopathy or radiculopathy, lumbar region: Secondary | ICD-10-CM | POA: Diagnosis not present

## 2020-03-24 DIAGNOSIS — K219 Gastro-esophageal reflux disease without esophagitis: Secondary | ICD-10-CM | POA: Insufficient documentation

## 2020-03-24 DIAGNOSIS — R079 Chest pain, unspecified: Secondary | ICD-10-CM

## 2020-03-24 DIAGNOSIS — Z79899 Other long term (current) drug therapy: Secondary | ICD-10-CM | POA: Diagnosis not present

## 2020-03-24 DIAGNOSIS — M810 Age-related osteoporosis without current pathological fracture: Secondary | ICD-10-CM | POA: Diagnosis not present

## 2020-03-24 DIAGNOSIS — F419 Anxiety disorder, unspecified: Secondary | ICD-10-CM | POA: Diagnosis not present

## 2020-03-24 DIAGNOSIS — J45909 Unspecified asthma, uncomplicated: Secondary | ICD-10-CM | POA: Insufficient documentation

## 2020-03-24 DIAGNOSIS — G319 Degenerative disease of nervous system, unspecified: Secondary | ICD-10-CM | POA: Diagnosis not present

## 2020-03-24 DIAGNOSIS — I700 Atherosclerosis of aorta: Secondary | ICD-10-CM | POA: Diagnosis not present

## 2020-03-24 DIAGNOSIS — K5730 Diverticulosis of large intestine without perforation or abscess without bleeding: Secondary | ICD-10-CM | POA: Diagnosis not present

## 2020-03-24 DIAGNOSIS — G309 Alzheimer's disease, unspecified: Secondary | ICD-10-CM | POA: Diagnosis not present

## 2020-03-24 DIAGNOSIS — E785 Hyperlipidemia, unspecified: Secondary | ICD-10-CM | POA: Diagnosis not present

## 2020-03-24 DIAGNOSIS — I959 Hypotension, unspecified: Secondary | ICD-10-CM | POA: Diagnosis not present

## 2020-03-24 DIAGNOSIS — H409 Unspecified glaucoma: Secondary | ICD-10-CM | POA: Diagnosis not present

## 2020-03-24 DIAGNOSIS — I10 Essential (primary) hypertension: Secondary | ICD-10-CM | POA: Insufficient documentation

## 2020-03-24 DIAGNOSIS — F0280 Dementia in other diseases classified elsewhere without behavioral disturbance: Secondary | ICD-10-CM | POA: Diagnosis not present

## 2020-03-24 DIAGNOSIS — I2510 Atherosclerotic heart disease of native coronary artery without angina pectoris: Secondary | ICD-10-CM | POA: Diagnosis not present

## 2020-03-24 DIAGNOSIS — Z8673 Personal history of transient ischemic attack (TIA), and cerebral infarction without residual deficits: Secondary | ICD-10-CM | POA: Diagnosis not present

## 2020-03-24 DIAGNOSIS — J3489 Other specified disorders of nose and nasal sinuses: Secondary | ICD-10-CM | POA: Diagnosis not present

## 2020-03-24 DIAGNOSIS — M47814 Spondylosis without myelopathy or radiculopathy, thoracic region: Secondary | ICD-10-CM | POA: Diagnosis not present

## 2020-03-24 DIAGNOSIS — Z8639 Personal history of other endocrine, nutritional and metabolic disease: Secondary | ICD-10-CM

## 2020-03-24 DIAGNOSIS — A419 Sepsis, unspecified organism: Secondary | ICD-10-CM | POA: Diagnosis not present

## 2020-03-24 LAB — CBC WITH DIFFERENTIAL/PLATELET
Abs Immature Granulocytes: 0.03 10*3/uL (ref 0.00–0.07)
Basophils Absolute: 0.1 10*3/uL (ref 0.0–0.1)
Basophils Relative: 1 %
Eosinophils Absolute: 0.1 10*3/uL (ref 0.0–0.5)
Eosinophils Relative: 1 %
HCT: 37.6 % (ref 36.0–46.0)
Hemoglobin: 11.9 g/dL — ABNORMAL LOW (ref 12.0–15.0)
Immature Granulocytes: 0 %
Lymphocytes Relative: 40 %
Lymphs Abs: 3.2 10*3/uL (ref 0.7–4.0)
MCH: 23.3 pg — ABNORMAL LOW (ref 26.0–34.0)
MCHC: 31.6 g/dL (ref 30.0–36.0)
MCV: 73.7 fL — ABNORMAL LOW (ref 80.0–100.0)
Monocytes Absolute: 0.8 10*3/uL (ref 0.1–1.0)
Monocytes Relative: 10 %
Neutro Abs: 3.9 10*3/uL (ref 1.7–7.7)
Neutrophils Relative %: 48 %
Platelets: 237 10*3/uL (ref 150–400)
RBC: 5.1 MIL/uL (ref 3.87–5.11)
RDW: 17.7 % — ABNORMAL HIGH (ref 11.5–15.5)
WBC: 8.1 10*3/uL (ref 4.0–10.5)
nRBC: 0 % (ref 0.0–0.2)

## 2020-03-24 LAB — COMPREHENSIVE METABOLIC PANEL
ALT: 17 U/L (ref 0–44)
AST: 23 U/L (ref 15–41)
Albumin: 3.8 g/dL (ref 3.5–5.0)
Alkaline Phosphatase: 54 U/L (ref 38–126)
Anion gap: 11 (ref 5–15)
BUN: 28 mg/dL — ABNORMAL HIGH (ref 8–23)
CO2: 26 mmol/L (ref 22–32)
Calcium: 9.8 mg/dL (ref 8.9–10.3)
Chloride: 104 mmol/L (ref 98–111)
Creatinine, Ser: 1.55 mg/dL — ABNORMAL HIGH (ref 0.44–1.00)
GFR calc Af Amer: 35 mL/min — ABNORMAL LOW (ref >60)
GFR calc non Af Amer: 30 mL/min — ABNORMAL LOW (ref >60)
Glucose, Bld: 108 mg/dL — ABNORMAL HIGH (ref 70–99)
Potassium: 5.1 mmol/L (ref 3.5–5.1)
Sodium: 141 mmol/L (ref 135–145)
Total Bilirubin: 0.4 mg/dL (ref 0.3–1.2)
Total Protein: 7.1 g/dL (ref 6.5–8.1)

## 2020-03-24 LAB — URINALYSIS, ROUTINE W REFLEX MICROSCOPIC
Bilirubin Urine: NEGATIVE
Glucose, UA: NEGATIVE mg/dL
Hgb urine dipstick: NEGATIVE
Ketones, ur: 5 mg/dL — AB
Leukocytes,Ua: NEGATIVE
Nitrite: POSITIVE — AB
Protein, ur: NEGATIVE mg/dL
Specific Gravity, Urine: >1.046 — ABNORMAL HIGH (ref 1.005–1.030)
pH: 5 (ref 5.0–8.0)

## 2020-03-24 LAB — I-STAT CHEM 8, ED
BUN: 27 mg/dL — ABNORMAL HIGH (ref 8–23)
Calcium, Ion: 1.08 mmol/L — ABNORMAL LOW (ref 1.15–1.40)
Chloride: 110 mmol/L (ref 98–111)
Creatinine, Ser: 1.3 mg/dL — ABNORMAL HIGH (ref 0.44–1.00)
Glucose, Bld: 81 mg/dL (ref 70–99)
HCT: 28 % — ABNORMAL LOW (ref 36.0–46.0)
Hemoglobin: 9.5 g/dL — ABNORMAL LOW (ref 12.0–15.0)
Potassium: 3.9 mmol/L (ref 3.5–5.1)
Sodium: 141 mmol/L (ref 135–145)
TCO2: 21 mmol/L — ABNORMAL LOW (ref 22–32)

## 2020-03-24 LAB — T4, FREE: Free T4: 1.17 ng/dL — ABNORMAL HIGH (ref 0.61–1.12)

## 2020-03-24 LAB — LACTIC ACID, PLASMA
Lactic Acid, Venous: 3.1 mmol/L (ref 0.5–1.9)
Lactic Acid, Venous: 0.9 mmol/L (ref 0.5–1.9)

## 2020-03-24 LAB — ABO/RH: ABO/RH(D): A POS

## 2020-03-24 LAB — TROPONIN I (HIGH SENSITIVITY)
Troponin I (High Sensitivity): 16 ng/L (ref <18)
Troponin I (High Sensitivity): 13 ng/L (ref <18)
Troponin I (High Sensitivity): 14 ng/L (ref <18)

## 2020-03-24 LAB — PROCALCITONIN: Procalcitonin: <0.1 ng/mL

## 2020-03-24 LAB — PROTIME-INR
INR: 1.1 (ref 0.8–1.2)
Prothrombin Time: 13.4 seconds (ref 11.4–15.2)

## 2020-03-24 LAB — TYPE AND SCREEN
ABO/RH(D): A POS
Antibody Screen: NEGATIVE

## 2020-03-24 LAB — TSH: TSH: 0.071 u[IU]/mL — ABNORMAL LOW (ref 0.350–4.500)

## 2020-03-24 LAB — SARS CORONAVIRUS 2 BY RT PCR (HOSPITAL ORDER, PERFORMED IN ~~LOC~~ HOSPITAL LAB): SARS Coronavirus 2: NEGATIVE

## 2020-03-24 MED ORDER — LEVOFLOXACIN IN D5W 250 MG/50ML IV SOLN
250.0000 mg | INTRAVENOUS | Status: DC
  Administered 2020-03-24 – 2020-03-25 (×2): 250 mg via INTRAVENOUS
  Filled 2020-03-24 (×3): qty 50

## 2020-03-24 MED ORDER — SODIUM CHLORIDE 0.9 % IV BOLUS
500.0000 mL | Freq: Once | INTRAVENOUS | Status: AC
  Administered 2020-03-24: 500 mL via INTRAVENOUS

## 2020-03-24 MED ORDER — NITROGLYCERIN 2 % TD OINT
0.5000 [in_us] | TOPICAL_OINTMENT | Freq: Once | TRANSDERMAL | Status: AC
  Administered 2020-03-24: 0.5 [in_us] via TOPICAL
  Filled 2020-03-24: qty 1

## 2020-03-24 MED ORDER — ACETAMINOPHEN 325 MG PO TABS
650.0000 mg | ORAL_TABLET | ORAL | Status: DC | PRN
  Administered 2020-03-25 (×2): 650 mg via ORAL
  Filled 2020-03-24 (×2): qty 2

## 2020-03-24 MED ORDER — MIRTAZAPINE 15 MG PO TABS
30.0000 mg | ORAL_TABLET | Freq: Every day | ORAL | Status: DC
  Administered 2020-03-25 (×2): 30 mg via ORAL
  Filled 2020-03-24: qty 2
  Filled 2020-03-24 (×2): qty 1

## 2020-03-24 MED ORDER — ENOXAPARIN SODIUM 30 MG/0.3ML ~~LOC~~ SOLN
30.0000 mg | SUBCUTANEOUS | Status: DC
  Administered 2020-03-24 – 2020-03-25 (×2): 30 mg via SUBCUTANEOUS
  Filled 2020-03-24 (×2): qty 0.3

## 2020-03-24 MED ORDER — PANTOPRAZOLE SODIUM 40 MG PO TBEC
40.0000 mg | DELAYED_RELEASE_TABLET | Freq: Every day | ORAL | Status: DC
  Administered 2020-03-25 – 2020-03-26 (×2): 40 mg via ORAL
  Filled 2020-03-24 (×2): qty 1

## 2020-03-24 MED ORDER — FENTANYL CITRATE (PF) 100 MCG/2ML IJ SOLN
50.0000 ug | Freq: Once | INTRAMUSCULAR | Status: AC
  Administered 2020-03-24: 50 ug via INTRAVENOUS
  Filled 2020-03-24: qty 2

## 2020-03-24 MED ORDER — ASPIRIN 81 MG PO CHEW
324.0000 mg | CHEWABLE_TABLET | Freq: Once | ORAL | Status: AC
  Administered 2020-03-24: 324 mg via ORAL
  Filled 2020-03-24: qty 4

## 2020-03-24 MED ORDER — FOLIC ACID 1 MG PO TABS
1.0000 mg | ORAL_TABLET | Freq: Every day | ORAL | Status: DC
  Administered 2020-03-25 – 2020-03-26 (×2): 1 mg via ORAL
  Filled 2020-03-24 (×2): qty 1

## 2020-03-24 MED ORDER — NITROGLYCERIN 0.4 MG SL SUBL
SUBLINGUAL_TABLET | SUBLINGUAL | Status: AC
  Administered 2020-03-24: 0.4 mg
  Filled 2020-03-24: qty 1

## 2020-03-24 MED ORDER — ASPIRIN EC 81 MG PO TBEC
81.0000 mg | DELAYED_RELEASE_TABLET | Freq: Every day | ORAL | Status: DC
  Administered 2020-03-25 – 2020-03-26 (×2): 81 mg via ORAL
  Filled 2020-03-24 (×2): qty 1

## 2020-03-24 MED ORDER — SIMVASTATIN 20 MG PO TABS
40.0000 mg | ORAL_TABLET | Freq: Every day | ORAL | Status: DC
  Administered 2020-03-25 (×2): 40 mg via ORAL
  Filled 2020-03-24 (×2): qty 2

## 2020-03-24 MED ORDER — ONDANSETRON HCL 4 MG/2ML IJ SOLN: 4.0000 mg | Freq: Four times a day (QID) | INTRAMUSCULAR | Status: DC | PRN

## 2020-03-24 MED ORDER — IOHEXOL 350 MG/ML SOLN
75.0000 mL | Freq: Once | INTRAVENOUS | Status: AC | PRN
  Administered 2020-03-24: 75 mL via INTRAVENOUS

## 2020-03-24 MED ORDER — ALBUTEROL SULFATE (2.5 MG/3ML) 0.083% IN NEBU: 3.0000 mL | INHALATION_SOLUTION | Freq: Four times a day (QID) | RESPIRATORY_TRACT | Status: DC | PRN

## 2020-03-24 MED ORDER — MEMANTINE HCL-DONEPEZIL HCL ER 28-10 MG PO CP24: 1.0000 | ORAL_CAPSULE | Freq: Every day | ORAL | Status: DC

## 2020-03-24 MED ORDER — MEMANTINE HCL ER 28 MG PO CP24
28.0000 mg | ORAL_CAPSULE | Freq: Every day | ORAL | Status: DC
  Administered 2020-03-25 – 2020-03-26 (×2): 28 mg via ORAL
  Filled 2020-03-24 (×2): qty 1

## 2020-03-24 MED ORDER — FERROUS SULFATE 325 (65 FE) MG PO TABS
325.0000 mg | ORAL_TABLET | Freq: Every day | ORAL | Status: DC
  Administered 2020-03-25 – 2020-03-26 (×2): 325 mg via ORAL
  Filled 2020-03-24 (×2): qty 1

## 2020-03-24 MED ORDER — DONEPEZIL HCL 10 MG PO TABS
10.0000 mg | ORAL_TABLET | Freq: Every day | ORAL | Status: DC
  Administered 2020-03-25 – 2020-03-26 (×2): 10 mg via ORAL
  Filled 2020-03-24 (×3): qty 1

## 2020-03-24 NOTE — ED Provider Notes (Signed)
Signout from Dr. Madilyn Hook.  86 year old female history of rheumatoid arthritis here with generalized weakness thoracic back pain.  Seen initially by PCP and referred to ED.  Patient has a history of dementia.  She currently denies any complaints.  Blood pressure initially in the 70s improved with IV fluids.  She is pending a CT head and CT chest abdomen and pelvis.  Plan is to reassess after that, likely will need admission unless full work-up unremarkable. Physical Exam  BP 123/84   Pulse (!) 57   Temp 97.9 F (36.6 C) (Oral)   Resp (!) 25   Ht 5' (1.524 m)   Wt 54.4 kg   SpO2 98%   BMI 23.44 kg/m   Physical Exam  ED Course/Procedures     Procedures  MDM  When I examined the patient she was resting comfortably in bed with improved blood pressure.  She then experienced some chest pain.  She appeared very uncomfortable.  Repeat EKG did not show an acute STEMI.  Troponins remained flat.  She was given aspirin and nitroglycerin with improvement.  She then had recurrent pain again later after returning from walking to the bathroom.  Nitropaste was ordered and I reviewed the case with [facility] hospitalist Dr. Cyndia Bent.  She will be admitted to the hospital for serial enzymes and continued management of her symptoms.       Terrilee Files, MD 03/25/20 [phone]

## 2020-03-24 NOTE — ED Triage Notes (Signed)
Pt sent by PCP for further evaluation of generalized weakness and hypotension since this weekend. Pt c.o some sob, fully vaccinated.Resp e.u at this time. BP 80s in triage

## 2020-03-24 NOTE — ED Provider Notes (Signed)
[facility] EMERGENCY DEPARTMENT Provider Note   CSN: [id number] Arrival date & time: 03/24/20  1158     History Chief Complaint  Patient presents with  . Weakness  . Hypotension    Jordan [name] is a 86 y.o. female.  The history is provided by the patient, a relative and medical records. No language interpreter was used.  Weakness  Jordan [name] is a 86 y.o. female who presents to the Emergency Department complaining of back pain. She presents the emergency department for evaluation of thoracic back pain and generalized weakness that started this morning. She went to her PCP today was referred to the emergency department because they could not find a pulse. She is unable to describe the pain anymore. She has mild associated shortness of breath. She denies any fevers, vomiting, abdominal pain, diarrhea. She lives at home by herself. She did not take her home medications today.    Past Medical History:  Diagnosis Date  . Allergy   . Anxiety    per pt's daughter  . Arthritis   . Asthma    pt reported  . Depression   . GERD (gastroesophageal reflux disease)   . Hearing deficit L  . High cholesterol   . Hypertension   . Osteoporosis   . Rheumatoid arthritis (HCC)   . Stroke [facility])     Patient Active Problem List   Diagnosis Date Noted  . Alzheimer's disease (HCC) 08/13/2019  . Essential hypertension 08/13/2018  . Acquired hypothyroidism 08/13/2018  . Rash and nonspecific skin eruption 08/13/2018  . Memory loss 04/12/2017  . Depression 04/12/2017  . Rheumatoid arthritis involving multiple sites with positive rheumatoid factor (HCC) 08/17/2016  . High risk medication use 08/17/2016  . H/O total knee replacement, bilateral 08/17/2016  . Spondylosis of lumbar region without myelopathy or radiculopathy 08/17/2016  . History of glaucoma/ patient is legally blind 08/17/2016  . History of gastroesophageal reflux (GERD) 08/17/2016  . History of  diverticulosis 08/17/2016  . History of hypothyroidism 08/17/2016  . history of asthmatic bronchitis 08/17/2016  . History of memory loss 08/17/2016  . History of atherosclerosis 08/17/2016    Past Surgical History:  Procedure Laterality Date  . ABDOMINAL HYSTERECTOMY    . KNEE ARTHROPLASTY    . REPLACEMENT TOTAL KNEE BILATERAL Bilateral   . WRIST SURGERY Left      OB History   No obstetric history on file.     No family history on file.  Social History   Tobacco Use  . Smoking status: Former Smoker    Packs/day: 0.25    Years: 13.00    Pack years: 3.25    Types: Cigarettes    Quit date: 08/18/2006    Years since quitting: 13.6  . Smokeless tobacco: Never Used  Vaping Use  . Vaping Use: Never used  Substance Use Topics  . Alcohol use: No  . Drug use: Not Currently    Types: Hydrocodone    Home Medications Prior to Admission medications   Medication Sig Start Date End Date Taking? Authorizing Provider  ENBREL SURECLICK 50 MG/ML injection Inject 0.98 mLs (50 mg total) into the skin once a week. 03/04/20   Pollyann Savoy, MD  albuterol (VENTOLIN HFA) 108 (90 Base) MCG/ACT inhaler Inhale 1 puff into the lungs every 6 (six) hours as needed for wheezing or shortness of breath. Patient not taking: Reported on 12/03/2019 06/03/19   Arnette Felts, FNP  ALLERGY RELIEF 10 MG tablet TAKE 1 TABLET BY  MOUTH EVERY DAY 01/13/20   Arnette Felts, FNP  amLODipine (NORVASC) 5 MG tablet Take 1 tablet (5 mg total) by mouth daily. 12/03/19   Arnette Felts, FNP  atropine 1 % ophthalmic solution INSTILL 1 GTT INTO OS D 10/19/17   [provider]  brimonidine-timolol (COMBIGAN) 0.2-0.5 % ophthalmic solution Place 1 drop into both eyes every 12 (twelve) hours.    [provider]  diclofenac Sodium (VOLTAREN) 1 % GEL Apply 2 g topically 4 (four) times daily. 01/20/20   Arnette Felts, FNP  fluticasone (FLONASE) 50 MCG/ACT nasal spray INSTILL 2 SPRAYS IN EACH NOSTRIL EVERY DAY  06/12/18   Arnette Felts, FNP  folic acid (FOLVITE) 1 MG tablet TAKE 1 TABLET(1 MG) BY MOUTH DAILY 07/12/18   Pollyann Savoy, MD  latanoprost (XALATAN) 0.005 % ophthalmic solution Place 1 drop into both eyes nightly. 10/24/16   [provider]  lisinopril (ZESTRIL) 30 MG tablet TAKE 1 TABLET BY MOUTH EVERY DAY 10/30/19   Arnette Felts, FNP  mirtazapine (REMERON) 30 MG tablet TAKE 1 TABLET BY MOUTH AT BEDTIME 03/17/20   Arnette Felts, FNP  [facility] 28-10 MG CP24 TAKE 1 CAPSULE BY MOUTH EVERY DAY 01/13/20   Arnette Felts, FNP  omeprazole (PRILOSEC) 20 MG capsule TAKE 1 CAPSULE BY MOUTH EVERY DAY 11/27/19   Arnette Felts, FNP  simvastatin (ZOCOR) 40 MG tablet TAKE 1 TABLET BY MOUTH AT BEDTIME Patient taking differently: Every other day. 10/30/19   Arnette Felts, FNP  SYNTHROID 100 MCG tablet TAKE 1 TABLET BY MOUTH EVERY DAY BEFORE BREAKFAST 01/13/20   Arnette Felts, FNP    Allergies    Penicillins  Review of Systems   Review of Systems  Neurological: Positive for weakness.  All other systems reviewed and are negative.   Physical Exam Updated Vital Signs BP 123/84   Pulse (!) 57   Temp 97.9 F (36.6 C) (Oral)   Resp (!) 25   Ht 5' (1.524 m)   Wt 54.4 kg   SpO2 98%   BMI 23.44 kg/m   Physical Exam Vitals and nursing note reviewed.  Constitutional:      General: She is in acute distress.     Appearance: She is well-developed. She is ill-appearing.  HENT:     Head: Normocephalic and atraumatic.  Cardiovascular:     Rate and Rhythm: Normal rate and regular rhythm.     Heart sounds: No murmur heard.   Pulmonary:     Effort: Pulmonary effort is normal. No respiratory distress.     Breath sounds: Normal breath sounds.  Abdominal:     Palpations: Abdomen is soft.     Tenderness: There is no abdominal tenderness. There is no guarding or rebound.  Musculoskeletal:        General: No tenderness.  Skin:    General: Skin is warm and dry.  Neurological:     Mental Status: She  is alert and oriented to person, place, and time.     Comments: Generalized weakness  Psychiatric:        Behavior: Behavior normal.     ED Results / Procedures / Treatments   Labs (all labs ordered are listed, but only abnormal results are displayed) Labs Reviewed  COMPREHENSIVE METABOLIC PANEL - Abnormal; Notable for the following components:      Result Value   Glucose, Bld 108 (*)    BUN 28 (*)    Creatinine, Ser 1.55 (*)    GFR calc non Af Amer 30 (*)  GFR calc Af Amer 35 (*)    All other components within normal limits  LACTIC ACID, PLASMA - Abnormal; Notable for the following components:   Lactic Acid, Venous 3.1 (*)    All other components within normal limits  CBC WITH DIFFERENTIAL/PLATELET - Abnormal; Notable for the following components:   Hemoglobin 11.9 (*)    MCV 73.7 (*)    MCH 23.3 (*)    RDW 17.7 (*)    All other components within normal limits  TSH - Abnormal; Notable for the following components:   TSH 0.071 (*)    All other components within normal limits  I-STAT CHEM 8, ED - Abnormal; Notable for the following components:   BUN 27 (*)    Creatinine, Ser 1.30 (*)    Calcium, Ion 1.08 (*)    TCO2 21 (*)    Hemoglobin 9.5 (*)    HCT 28.0 (*)    All other components within normal limits  CULTURE, BLOOD (ROUTINE X 2)  CULTURE, BLOOD (ROUTINE X 2)  SARS CORONAVIRUS 2 BY RT PCR (HOSPITAL ORDER, PERFORMED IN [place])  LACTIC ACID, PLASMA  PROTIME-INR  URINALYSIS, ROUTINE W REFLEX MICROSCOPIC  TYPE AND SCREEN  ABO/RH  TROPONIN I (HIGH SENSITIVITY)  TROPONIN I (HIGH SENSITIVITY)    EKG EKG Interpretation  Date/Time:  Tuesday March 24 2020 12:44:36 EDT Ventricular Rate:  71 PR Interval:  132 QRS Duration: 74 QT Interval:  448 QTC Calculation: 486 R Axis:   76 Text Interpretation: Normal sinus rhythm with sinus arrhythmia Nonspecific ST abnormality Abnormal ECG Confirmed by Tilden Fossa [phone]) on 03/24/2020 1:41:42  PM   Radiology DG Chest 2 View  Result Date: 03/24/2020 CLINICAL DATA:  Suspected sepsis. EXAM: CHEST - 2 VIEW COMPARISON:  03/31/2015 chest radiograph and CTA chest. FINDINGS: Mild cardiomegaly. No focal consolidation. No pneumothorax or pleural effusion. Mediastinal contours within normal limits. Aortic atherosclerotic calcifications. No acute osseous abnormality. IMPRESSION: No focal airspace disease. Mild cardiomegaly. Electronically Signed   By: Stana Bunting M.D.   On: 03/24/2020 13:32    Procedures Procedures (including critical care time) Angiocath insertion Performed by: Tilden Fossa  Consent: Verbal consent obtained. Risks and benefits: risks, benefits and alternatives were discussed Time out: Immediately prior to procedure a "time out" was called to verify the correct patient, procedure, equipment, support staff and site/side marked as required.  Preparation: Patient was prepped and draped in the usual sterile fashion.  Vein Location: right AC  Ultrasound Guided  Gauge: 20  Normal blood return and flush without difficulty Patient tolerance: Patient tolerated the procedure well with no immediate complications.  CRITICAL CARE Performed by: Tilden Fossa   Total critical care time: 35 minutes  Critical care time was exclusive of separately billable procedures and treating other patients.  Critical care was necessary to treat or prevent imminent or life-threatening deterioration.  Critical care was time spent personally by me on the following activities: development of treatment plan with patient and/or surrogate as well as nursing, discussions with consultants, evaluation of patient's response to treatment, examination of patient, obtaining history from patient or surrogate, ordering and performing treatments and interventions, ordering and review of laboratory studies, ordering and review of radiographic studies, pulse oximetry and re-evaluation of patient's  condition.   Medications Ordered in ED Medications  sodium chloride 0.9 % bolus 500 mL (500 mLs Intravenous New Bag/Given 03/24/20 1423)    ED Course  I have reviewed the triage vital signs and the nursing  notes.  Pertinent labs & imaging results that were available during my care of the patient were reviewed by me and considered in my medical decision making (see chart for details).    MDM Rules/Calculators/A&P                          Pt here for evaluation of back pain, weakness.  She is ill appearing on evaluation with generalized weakness.  She was hypotensive on ED arrival.  BP improved after IVF administration.  Checking CTA to further evaluate source of hypotension.  Pt care transferred pending CTA.   Final Clinical Impression(s) / ED Diagnoses Final diagnoses:  None    Rx / DC Orders ED Discharge Orders    None       Tilden Fossa, MD 03/24/20 1554

## 2020-03-24 NOTE — H&P (Signed)
History and Physical    Jordan [name] JAS:[id number] DOB: 14-Dec-1933 DOA: 03/24/2020  PCP: Arnette Felts, FNP  Patient coming from: Home, lives with grand-daughter  I have personally briefly reviewed patient's old medical records in [facility] Link  Chief Complaint: chest pain and generalized malaise  HPI: Jordan [name] is a 86 y.o. 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  Son at bedside states that she was otherwise in her normal state of health last night.  However she states that some time during the middle the night she awoke with acute midsternal sharp chest pain that is nonradiating.  Has been constant and has now just resolved after getting Nitropaste.  Also has some associated shortness of breath.  Denies any coughing, runny nose or fever.  No nausea, vomiting.  Has had good appetite.  Denies any abdominal pain.  Denies any dysuria, increased urgency or frequency.  No sick contact.  She received her Covid vaccine about 2 months ago.  Patient presented to primary care physician today with the symptoms and they had difficulty finding her pulse and she was noted to be hypotensive and was sent to ED for eval  ED Course: She was afebrile, hypotensive down to systolic of 70s over 50s on room air. CBC showed no leukocytosis, mild microcytic anemia with hemoglobin of 11.9.  Has AKI with creatinine 1.55 from a prior of 0.82.  Initial lactic acidosis of 3.1 that improved with fluid down to 0.9. Initial troponin of 16 and down to 13. EKG with borderline ST depression in the anteriolateral lead.  Review of Systems:  Constitutional: No Weight Change, No Fever ENT/Mouth: No sore throat, No Rhinorrhea Eyes: No Eye Pain, No Vision Changes Cardiovascular: + Chest Pain, + SOB Respiratory: No Cough, No Sputum, No Wheezing, no Dyspnea  Gastrointestinal: No Nausea, No  Vomiting, No Diarrhea, No Constipation, No Pain Genitourinary: no Urinary Incontinence, No Urgency, No Flank Pain Musculoskeletal: No Arthralgias, No Myalgias Skin: No Skin Lesions, No Pruritus, Neuro: no Weakness, No Numbness Psych: No Anxiety/Panic, No Depression, no decrease appetite Heme/Lymph: No Bruising, No Bleeding   Past Medical History:  Diagnosis Date  . Allergy   . Anxiety    per pt's daughter  . Arthritis   . Asthma    pt reported  . Depression   . GERD (gastroesophageal reflux disease)   . Hearing deficit L  . High cholesterol   . Hypertension   . Osteoporosis   . Rheumatoid arthritis (HCC)   . Stroke [facility])     Past Surgical History:  Procedure Laterality Date  . ABDOMINAL HYSTERECTOMY    . KNEE ARTHROPLASTY    . REPLACEMENT TOTAL KNEE BILATERAL Bilateral   . WRIST SURGERY Left      reports that she quit smoking about 13 years ago. Her smoking use included cigarettes. She has a 3.25 pack-year smoking history. She has never used smokeless tobacco. She reports previous drug use. Drug: Hydrocodone. She reports that she does not drink alcohol. Social History  Allergies  Allergen Reactions  . Penicillins Hives    No family history on file.   Prior to Admission medications   Medication Sig Start Date End Date Taking? Authorizing Provider  ENBREL SURECLICK 50 MG/ML injection Inject 0.98 mLs (50 mg total) into the skin once a week. 03/04/20   Pollyann Savoy, MD  albuterol (VENTOLIN HFA) 108 (90 Base) MCG/ACT inhaler Inhale 1  puff into the lungs every 6 (six) hours as needed for wheezing or shortness of breath. Patient not taking: Reported on 12/03/2019 06/03/19   Arnette Felts, FNP  ALLERGY RELIEF 10 MG tablet TAKE 1 TABLET BY MOUTH EVERY DAY 01/13/20   Arnette Felts, FNP  amLODipine (NORVASC) 5 MG tablet Take 1 tablet (5 mg total) by mouth daily. 12/03/19   Arnette Felts, FNP  atropine 1 % ophthalmic solution INSTILL 1 GTT INTO OS D 10/19/17   [provider]  brimonidine-timolol (COMBIGAN) 0.2-0.5 % ophthalmic solution Place 1 drop into both eyes every 12 (twelve) hours.    [provider]  diclofenac Sodium (VOLTAREN) 1 % GEL Apply 2 g topically 4 (four) times daily. 01/20/20   Arnette Felts, FNP  fluticasone (FLONASE) 50 MCG/ACT nasal spray INSTILL 2 SPRAYS IN EACH NOSTRIL EVERY DAY 06/12/18   Arnette Felts, FNP  folic acid (FOLVITE) 1 MG tablet TAKE 1 TABLET(1 MG) BY MOUTH DAILY 07/12/18   Pollyann Savoy, MD  latanoprost (XALATAN) 0.005 % ophthalmic solution Place 1 drop into both eyes nightly. 10/24/16   [provider]  lisinopril (ZESTRIL) 30 MG tablet TAKE 1 TABLET BY MOUTH EVERY DAY 10/30/19   Arnette Felts, FNP  mirtazapine (REMERON) 30 MG tablet TAKE 1 TABLET BY MOUTH AT BEDTIME 03/17/20   Arnette Felts, FNP  [facility] 28-10 MG CP24 TAKE 1 CAPSULE BY MOUTH EVERY DAY 01/13/20   Arnette Felts, FNP  omeprazole (PRILOSEC) 20 MG capsule TAKE 1 CAPSULE BY MOUTH EVERY DAY 11/27/19   Arnette Felts, FNP  simvastatin (ZOCOR) 40 MG tablet TAKE 1 TABLET BY MOUTH AT BEDTIME Patient taking differently: Every other day. 10/30/19   Arnette Felts, FNP  SYNTHROID 100 MCG tablet TAKE 1 TABLET BY MOUTH EVERY DAY BEFORE BREAKFAST 01/13/20   Arnette Felts, FNP    Physical Exam: Vitals:   03/24/20 1700 03/24/20 1730 03/24/20 1832 03/24/20 1930  BP: (!) 123/58 (!) 100/53 (!) 146/106 (!) 93/52  Pulse:  89 71 67  Resp: 15 (!) 27 (!) 24 (!) 21  Temp:      TempSrc:      SpO2:  98% 100% 98%  Weight:      Height:        Constitutional: NAD, calm, comfortable, well-appearing elderly female laying flat in bed with her eyes closed Vitals:   03/24/20 1700 03/24/20 1730 03/24/20 1832 03/24/20 1930  BP: (!) 123/58 (!) 100/53 (!) 146/106 (!) 93/52  Pulse:  89 71 67  Resp: 15 (!) 27 (!) 24 (!) 21  Temp:      TempSrc:      SpO2:  98% 100% 98%  Weight:      Height:       Eyes: PERRL, lids and conjunctivae normal ENMT: Mucous membranes  are moist.  Neck: normal, supple Respiratory: clear to auscultation bilaterally, no wheezing, no crackles. Normal respiratory effort. No accessory muscle use.  No reproducible chest pain on palpation. Has nitro patch on anterior chest. Cardiovascular: Regular rate and rhythm, no murmurs / rubs / gallops. No extremity edema. 2+ pedal pulses.   Abdomen: no tenderness, no masses palpated.  Bowel sounds positive.  Musculoskeletal: no clubbing / cyanosis. No joint deformity upper and lower extremities. Good ROM, no contractures. Normal muscle tone.  Skin: no rashes, lesions, ulcers. No induration Neurologic: CN 2-12 grossly intact. Sensation intact,  Strength 5/5 in all 4.  Psychiatric: Normal judgment and insight. Alert and oriented x 3. Normal mood.  Labs on Admission: I have personally reviewed following labs and imaging studies  CBC: Recent Labs  Lab 03/24/20 1250 03/24/20 1432  WBC 8.1  --   NEUTROABS 3.9  --   HGB 11.9* 9.5*  HCT 37.6 28.0*  MCV 73.7*  --   PLT 237  --    Basic Metabolic Panel: Recent Labs  Lab 03/24/20 1250 03/24/20 1432  NA 141 141  K 5.1 3.9  CL 104 110  CO2 26  --   GLUCOSE 108* 81  BUN 28* 27*  CREATININE 1.55* 1.30*  CALCIUM 9.8  --    GFR: Estimated Creatinine Clearance: 23.1 mL/min (A) (by C-G formula based on SCr of 1.3 mg/dL (H)). Liver Function Tests: Recent Labs  Lab 03/24/20 1250  AST 23  ALT 17  ALKPHOS 54  BILITOT 0.4  PROT 7.1  ALBUMIN 3.8   No results for input(s): LIPASE, AMYLASE in the last 168 hours. No results for input(s): AMMONIA in the last 168 hours. Coagulation Profile: Recent Labs  Lab 03/24/20 1250  INR 1.1   Cardiac Enzymes: No results for input(s): CKTOTAL, CKMB, CKMBINDEX, TROPONINI in the last 168 hours. BNP (last 3 results) No results for input(s): PROBNP in the last 8760 hours. HbA1C: No results for input(s): HGBA1C in the last 72 hours. CBG: No results for input(s): GLUCAP in the last 168  hours. Lipid Profile: No results for input(s): CHOL, HDL, LDLCALC, TRIG, CHOLHDL, LDLDIRECT in the last 72 hours. Thyroid Function Tests: Recent Labs    03/24/20 1354  TSH 0.071*   Anemia Panel: No results for input(s): VITAMINB12, FOLATE, FERRITIN, TIBC, IRON, RETICCTPCT in the last 72 hours. Urine analysis:    Component Value Date/Time   COLORURINE YELLOW 03/24/2020 1811   APPEARANCEUR CLEAR 03/24/2020 1811   LABSPEC >1.046 (H) 03/24/2020 1811   PHURINE 5.0 03/24/2020 1811   GLUCOSEU NEGATIVE 03/24/2020 1811   HGBUR NEGATIVE 03/24/2020 1811   BILIRUBINUR NEGATIVE 03/24/2020 1811   BILIRUBINUR negative 02/21/2019 1607   KETONESUR 5 (A) 03/24/2020 1811   PROTEINUR NEGATIVE 03/24/2020 1811   UROBILINOGEN 2.0 (A) 02/21/2019 1607   NITRITE POSITIVE (A) 03/24/2020 1811   LEUKOCYTESUR NEGATIVE 03/24/2020 1811    Radiological Exams on Admission: DG Chest 2 View  Result Date: 03/24/2020 CLINICAL DATA:  Suspected sepsis. EXAM: CHEST - 2 VIEW COMPARISON:  03/31/2015 chest radiograph and CTA chest. FINDINGS: Mild cardiomegaly. No focal consolidation. No pneumothorax or pleural effusion. Mediastinal contours within normal limits. Aortic atherosclerotic calcifications. No acute osseous abnormality. IMPRESSION: No focal airspace disease. Mild cardiomegaly. Electronically Signed   By: Stana Bunting M.D.   On: 03/24/2020 13:32   CT Head Wo Contrast  Result Date: 03/24/2020 CLINICAL DATA:  Mental status change, unknown cause. Additional provided: Weakness, hypotension, back pain. EXAM: CT HEAD WITHOUT CONTRAST TECHNIQUE: Contiguous axial images were obtained from the base of the skull through the vertex without intravenous contrast. COMPARISON:  Head CT 12/04/2019, brain MRI 05/18/2017. FINDINGS: Brain: Stable, mild generalized parenchymal atrophy. Stable, mild ill-defined hypoattenuation within the cerebral white matter which is nonspecific, but consistent with chronic small vessel  ischemic disease. There is no acute intracranial hemorrhage. No demarcated cortical infarct. No extra-axial fluid collection. No evidence of intracranial mass. No midline shift. Vascular: No hyperdense vessel atherosclerotic calcifications Skull: Normal. Negative for fracture or focal lesion. Sinuses/Orbits: Right-sided glaucoma valve. Visualized orbits show no acute finding. Minimal ethmoid sinus mucosal thickening. IMPRESSION: No CT evidence of acute intracranial abnormality. Stable mild generalized parenchymal atrophy  and chronic small vessel ischemic disease. Minimal ethmoid sinus mucosal thickening. Electronically Signed   By: Jackey Loge DO   On: 03/24/2020 16:37   CT Angio Chest/Abd/Pel for Dissection W and/or W/WO  Result Date: 03/24/2020 CLINICAL DATA:  Chest pain and back pain. EXAM: CT ANGIOGRAPHY CHEST, ABDOMEN AND PELVIS TECHNIQUE: Non-contrast CT of the chest was initially obtained. Multidetector CT imaging through the chest, abdomen and pelvis was performed using the standard protocol during bolus administration of intravenous contrast. Multiplanar reconstructed images and MIPs were obtained and reviewed to evaluate the vascular anatomy. CONTRAST:  64mL OMNIPAQUE IOHEXOL 350 MG/ML SOLN COMPARISON:  March 31, 2015 FINDINGS: CTA CHEST FINDINGS Cardiovascular: There is moderate severity calcification of the aortic arch. The ascending thoracic aorta measures approximately 3.2 cm in diameter, while the distal descending thoracic aorta measures approximately 2.8 cm x 2.6 cm. Satisfactory opacification of the pulmonary arteries to the segmental level. No evidence of pulmonary embolism. The main pulmonary artery measures 3.5 cm. Normal heart size. No pericardial effusion. Marked severity coronary artery calcification is noted Mediastinum/Nodes: No enlarged mediastinal, hilar, or axillary lymph nodes. Thyroid gland, trachea, and esophagus demonstrate no significant findings. Lungs/Pleura: Mild  atelectasis and/or infiltrate is seen within the left lower lobe. There is no evidence of a pleural effusion or pneumothorax. Musculoskeletal: Multilevel degenerative changes seen throughout the thoracic spine. Review of the MIP images confirms the above findings. CTA ABDOMEN AND PELVIS FINDINGS VASCULAR Aorta: There is moderate to marked severity calcification of the abdominal aorta with and infrarenal abdominal aortic measurement of 2.4 cm x 2.3 cm. There is no evidence of dissection, vasculitis or significant stenosis. Celiac: Patent without evidence of aneurysm, dissection, vasculitis or significant stenosis. SMA: Patent without evidence of aneurysm, dissection, vasculitis or significant stenosis. Renals: Both renal arteries are patent without evidence of aneurysm, dissection, vasculitis, fibromuscular dysplasia or significant stenosis. IMA: Patent without evidence of aneurysm, dissection, vasculitis or significant stenosis. Inflow: Moderate to marked severity calcification without evidence of aneurysm, dissection, vasculitis or significant stenosis. Veins: No obvious venous abnormality within the limitations of this arterial phase study. Review of the MIP images confirms the above findings. NON-VASCULAR Hepatobiliary: No focal liver abnormality is seen. Status post cholecystectomy. No biliary dilatation. Pancreas: Unremarkable. No pancreatic ductal dilatation or surrounding inflammatory changes. Spleen: Normal in size without focal abnormality. Adrenals/Urinary Tract: Adrenal glands are unremarkable. Kidneys are normal, without renal calculi, focal lesion, or hydronephrosis. Bladder is unremarkable. Stomach/Bowel: Stomach is within normal limits. The appendix is not clearly identified. No evidence of bowel wall thickening, distention, or inflammatory changes. Numerous noninflamed diverticula are seen throughout the large bowel. Lymphatic: No abnormal abdominal or pelvic lymph nodes are identified. Reproductive:  Status post hysterectomy. No adnexal masses. Other: No abdominal wall hernia or abnormality. No abdominopelvic ascites. Musculoskeletal: Multilevel degenerative changes seen throughout the lumbar spine. Review of the MIP images confirms the above findings. IMPRESSION: 1. Mild left lower lobe atelectasis and/or infiltrate. 2. No evidence of aortic dissection or aneurysm. 3. Marked severity coronary artery calcification. 4. Enlarged main pulmonary artery which may be indicative of pulmonary arterial hypertension. 5. Colonic diverticulosis without evidence of diverticulitis. 6. Aortic atherosclerosis. Aortic Atherosclerosis (ICD10-I70.0). Electronically Signed   By: Aram Candela M.D.   On: 03/24/2020 16:49      Assessment/Plan  Hypotension  unclear etiology althought has AKI to suggest some hypovolemia. She is not on an impressive amount or dose of antihypertensives- hold. She is complaining of associated chest pain although troponin has been  downward trending 16-->13. Will check one more.  obtain echo UA does show positive nitrate and many bacteria.  Although patient does not complain of urinary symptoms, I still have concerns that perhaps her hypotension comes from an infection given there is definitive clear reason. Will obtain urine culture and start her on Levaquin (has PCN allergy).   Dementia  Continue memantine, donezepil, Remeron  Hypothyroidism  TSH noted to be consistently low for the past 3 months  will also check Free T4 and hold levothyroxine for now as she could be hyperthyroid now   DVT prophylaxis:.Lovenox Code Status: Full Family Communication: Plan discussed with patient and son at bedside.  All concerns and questions were addressed  [facility] Would like updates and can be reached at: [phone]  disposition Plan: Home with observation Consults called:  Admission status: Observation Status is: Observation  The patient remains OBS  appropriate and will d/c before 2 midnights.  Dispo: The patient is from: Home              Anticipated d/c is to: Home              Anticipated d/c date is: 1 day              Patient currently is not medically stable to d/c.         Anselm Jungling DO [facility]   If 7PM-7AM, please contact night-coverage [email or web address]   03/24/2020, 7:43 PM

## 2020-03-25 ENCOUNTER — Encounter (HOSPITAL_COMMUNITY): Payer: Self-pay | Admitting: Family Medicine

## 2020-03-25 ENCOUNTER — Observation Stay (HOSPITAL_BASED_OUTPATIENT_CLINIC_OR_DEPARTMENT_OTHER): Payer: Medicare Other

## 2020-03-25 ENCOUNTER — Other Ambulatory Visit: Payer: Self-pay

## 2020-03-25 ENCOUNTER — Ambulatory Visit: Payer: Medicare Other

## 2020-03-25 DIAGNOSIS — R079 Chest pain, unspecified: Secondary | ICD-10-CM

## 2020-03-25 DIAGNOSIS — Z20822 Contact with and (suspected) exposure to covid-19: Secondary | ICD-10-CM | POA: Diagnosis not present

## 2020-03-25 DIAGNOSIS — I959 Hypotension, unspecified: Secondary | ICD-10-CM | POA: Diagnosis not present

## 2020-03-25 DIAGNOSIS — Z8639 Personal history of other endocrine, nutritional and metabolic disease: Secondary | ICD-10-CM | POA: Diagnosis not present

## 2020-03-25 DIAGNOSIS — E785 Hyperlipidemia, unspecified: Secondary | ICD-10-CM | POA: Diagnosis not present

## 2020-03-25 DIAGNOSIS — I10 Essential (primary) hypertension: Secondary | ICD-10-CM | POA: Diagnosis not present

## 2020-03-25 DIAGNOSIS — F0280 Dementia in other diseases classified elsewhere without behavioral disturbance: Secondary | ICD-10-CM

## 2020-03-25 DIAGNOSIS — E039 Hypothyroidism, unspecified: Secondary | ICD-10-CM | POA: Diagnosis not present

## 2020-03-25 LAB — BASIC METABOLIC PANEL
Anion gap: 9 (ref 5–15)
BUN: 23 mg/dL (ref 8–23)
CO2: 19 mmol/L — ABNORMAL LOW (ref 22–32)
Calcium: 8.7 mg/dL — ABNORMAL LOW (ref 8.9–10.3)
Chloride: 112 mmol/L — ABNORMAL HIGH (ref 98–111)
Creatinine, Ser: 1.47 mg/dL — ABNORMAL HIGH (ref 0.44–1.00)
GFR calc Af Amer: 38 mL/min — ABNORMAL LOW (ref >60)
GFR calc non Af Amer: 32 mL/min — ABNORMAL LOW (ref >60)
Glucose, Bld: 94 mg/dL (ref 70–99)
Potassium: 4.1 mmol/L (ref 3.5–5.1)
Sodium: 140 mmol/L (ref 135–145)

## 2020-03-25 LAB — ECHOCARDIOGRAM COMPLETE
Area-P 1/2: 3.12 cm2
Height: 60 in
S' Lateral: 2.6 cm
Weight: 1920 oz

## 2020-03-25 LAB — URINE CULTURE

## 2020-03-25 LAB — TROPONIN I (HIGH SENSITIVITY): Troponin I (High Sensitivity): 9 ng/L (ref <18)

## 2020-03-25 MED ORDER — SODIUM CHLORIDE 0.9 % IV SOLN: INTRAVENOUS | Status: DC

## 2020-03-25 MED ORDER — LEVOTHYROXINE SODIUM 88 MCG PO TABS
88.0000 ug | ORAL_TABLET | Freq: Every day | ORAL | Status: DC
  Administered 2020-03-26: 88 ug via ORAL
  Filled 2020-03-25: qty 1

## 2020-03-25 MED ORDER — ENSURE ENLIVE PO LIQD
237.0000 mL | Freq: Every day | ORAL | Status: DC
  Administered 2020-03-25: 237 mL via ORAL

## 2020-03-25 MED ORDER — HYDROMORPHONE HCL 1 MG/ML IJ SOLN
0.2500 mg | INTRAMUSCULAR | Status: DC | PRN
  Administered 2020-03-25: 0.25 mg via INTRAVENOUS
  Filled 2020-03-25: qty 1

## 2020-03-25 NOTE — Progress Notes (Signed)
Initial Nutrition Assessment  DOCUMENTATION CODES:   Not applicable  INTERVENTION:  Provide Ensure Enlive po once daily, each supplement provides 350 kcal and 20 grams of protein.  Encourage adequate PO intake.   NUTRITION DIAGNOSIS:   Increased nutrient needs related to acute illness as evidenced by estimated needs.  GOAL:   Patient will meet greater than or equal to 90% of their needs  MONITOR:   PO intake, Supplement acceptance, Skin, Weight trends, Labs, I & O's  REASON FOR ASSESSMENT:   Malnutrition Screening Tool    ASSESSMENT:   86 y.o. 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  Pt reports having a good appetite currently and PTA with usual consumption of 3 meals a day with no difficulties. Pt with no weight loss per weight records. RD to order nutritional supplements to aid in caloric and protein needs.   NUTRITION - FOCUSED PHYSICAL EXAM:    Most Recent Value  Orbital Region Moderate depletion  Upper Arm Region No depletion  Thoracic and Lumbar Region No depletion  Buccal Region Moderate depletion  Temple Region Moderate depletion  Clavicle Bone Region Moderate depletion  Clavicle and Acromion Bone Region Moderate depletion  Scapular Bone Region Unable to assess  Dorsal Hand Unable to assess  Patellar Region No depletion  Anterior Thigh Region No depletion  Posterior Calf Region No depletion  Edema (RD Assessment) None  Hair Reviewed  Eyes Reviewed  Mouth Reviewed  Skin Reviewed  Nails Reviewed     Labs and medications reviewed.   Diet Order:   Diet Order            Diet Heart Room service appropriate? Yes; Fluid consistency: Thin  Diet effective now                 EDUCATION NEEDS:   Not appropriate for education at this time  Skin:  Skin Assessment: Reviewed RN Assessment  Last BM:  8/31  Height:   Ht  Readings from Last 1 Encounters:  03/24/20 5' (1.524 m)    Weight:   Wt Readings from Last 1 Encounters:  03/24/20 54.4 kg   BMI:  Body mass index is 23.44 kg/m.  Estimated Nutritional Needs:   Kcal:  1500-1700  Protein:  75-85 grams  Fluid:  >/= 1.5 L/day  Roslyn Smiling, MS, RD, LDN RD pager number/after hours weekend pager number on Amion.

## 2020-03-25 NOTE — TOC Initial Note (Signed)
Transition of Care [facility]) - Initial/Assessment Note    Patient Details  Name: Jordan [name] MRN: [id number] Date of Birth: [birth year]  [facility] CM/SW Contact:    Kingsley Plan, RN Phone Number: 03/25/2020, 4:48 PM  Clinical Narrative:                  Spoke to son Jordan [name] [phone] , discussed PT recommendations. Patient does have 24 hour supervision. Grand daughter stays with her at night , and before grand daughter leaves in morning caregiver comes to stay with her. She has rolator, walker, and cane at home already. [name] has no preference in home health agency.   [facility]ee Keen with [facility] accepted referral.  Expected Discharge Plan: Home w Home Health Services Barriers to Discharge: Continued Medical Work up   Patient Goals and CMS Choice Patient states their goals for this hospitalization and ongoing recovery are:: to go home CMS Medicare.gov Compare Post Acute Care list provided to:: Patient Choice offered to / list presented to : Patient, Adult Children  Expected Discharge Plan and Services Expected Discharge Plan: Home w Home Health Services   Discharge Planning Services: CM Consult Post Acute Care Choice: Home Health Living arrangements for the past 2 months: Single Family Home                 DME Arranged: N/A         HH Arranged: PT HH Agency: [facility] Date [facility] Agency Contacted: 03/25/20 Time HH Agency Contacted: 1647 Representative spoke with at [facility] Agency: Kandee Keen  Prior Living Arrangements/Services Living arrangements for the past 2 months: Single Family Home Lives with:: Self Patient language and need for interpreter reviewed:: Yes Do you feel safe going back to the place where you live?: Yes      Need for Family Participation in Patient Care: Yes (Comment) Care giver support system in place?: Yes (comment)   Criminal Activity/Legal Involvement Pertinent to Current Situation/Hospitalization: No - Comment as  needed  Activities of Daily Living Home Assistive Devices/Equipment: Eyeglasses, Environmental consultant (specify type), Dentures (specify type), Cane (specify quad or straight) ADL Screening (condition at time of admission) Patient's cognitive ability adequate to safely complete daily activities?: Yes Is the patient deaf or have difficulty hearing?: No Does the patient have difficulty seeing, even when wearing glasses/contacts?: No Does the patient have difficulty concentrating, remembering, or making decisions?: No Patient able to express need for assistance with ADLs?: Yes Does the patient have difficulty dressing or bathing?: Yes Independently performs ADLs?: No Communication: Independent Dressing (OT): Needs assistance Is this a change from baseline?: Pre-admission baseline Grooming: Needs assistance Is this a change from baseline?: Pre-admission baseline Feeding: Independent Bathing: Needs assistance Is this a change from baseline?: Pre-admission baseline Toileting: Needs assistance Is this a change from baseline?: Pre-admission baseline In/Out Bed: Independent Walks in Home: Independent with device (comment) Does the patient have difficulty walking or climbing stairs?: No Weakness of Legs: None Weakness of Arms/Hands: None  Permission Sought/Granted   Permission granted to share information with : Yes, Verbal Permission Granted  Share Information with NAME: Jordan [name] [phone]  son  Permission granted to share info w AGENCY: [facility]        Emotional Assessment Appearance:: Appears stated age            Admission diagnosis:  Dehydration [E86.0] Weakness [R53.1] Hypotension [I95.9] Hypotension, unspecified hypotension type [I95.9] Chest pain, unspecified type [R07.9] Patient Active Problem List  Diagnosis Date Noted  . Hypotension 03/24/2020  . Alzheimer's disease (HCC) 08/13/2019  . Essential hypertension 08/13/2018  . Acquired hypothyroidism 08/13/2018  . Rash and  nonspecific skin eruption 08/13/2018  . Memory loss 04/12/2017  . Depression 04/12/2017  . Rheumatoid arthritis involving multiple sites with positive rheumatoid factor (HCC) 08/17/2016  . High risk medication use 08/17/2016  . H/O total knee replacement, bilateral 08/17/2016  . Spondylosis of lumbar region without myelopathy or radiculopathy 08/17/2016  . History of glaucoma/ patient is legally blind 08/17/2016  . History of gastroesophageal reflux (GERD) 08/17/2016  . History of diverticulosis 08/17/2016  . History of hypothyroidism 08/17/2016  . history of asthmatic bronchitis 08/17/2016  . History of memory loss 08/17/2016  . History of atherosclerosis 08/17/2016   PCP:  Arnette Felts, FNP Pharmacy:   [facility] [place], [place] - [address] [place] [address] Phone: [phone] Fax: [phone]  [id number] [place], [place] - [address] AT [facility] [address] [place] [address] Phone: [phone] Fax: [phone]     Social Determinants of Health (SDOH) Interventions    Readmission Risk Interventions No flowsheet data found.

## 2020-03-25 NOTE — Progress Notes (Signed)
RN ran into room pt was yelling out " Help me" asked pt what was wrong and she stated that she was having chest pain that felt like something was crushing her. VS obtained and in flowsheet. EKG taken and MD paged awaiting call back. Tylenol given to aid with pain waiting for further instruction

## 2020-03-25 NOTE — Chronic Care Management (AMB) (Signed)
  Chronic Care Management   Inpatient Admit Review Note  03/25/2020 Name: Omega Durante MRN: [id number] DOB: 1934/01/09  Margaret Cockerill is a 86 y.o. year old female who is a primary care patient of Arnette Felts, FNP. Miosotis Wetsel is actively engaged with the embedded care management team in the primary care practice and is being followed by RN Case Manager and BSW for assistance with disease management and care coordination needs related to HTN and Alzheimer's Disease.   Shane Melby is currently admitted to the hospital for evaluation and treatment of weakness, hypotension, dehydration, and chest pain. Current treatment plan is continued work up to establish cause of chest pain and hypotension.   Plan: CM team will collaborate with [facility] and will follow patient post discharge.    Bevelyn Ngo, BSW, CDP Social Worker, Certified Dementia Practitioner [facility] Care Management [phone]

## 2020-03-25 NOTE — Evaluation (Signed)
Physical Therapy Evaluation Patient Details Name: Jordan [name] MRN: [id number] DOB: 1933-08-13 Today's Date: 03/25/2020   History of Present Illness  86yo female c/o general malaise, SOB, and chest pain. Chest pain resolved with Nitropaste. Found to be hypotensive at her PCP and was referred to the ED. PMH HOH, HTN, OA, CVA, B TKR, dementia, legally blind  Clinical Impression   Patient received in bed asleep but easily woken, willing to participate in therapy. Followed commands well and interacted appropriately, however did have some conflicting statements such as her living alone but having family assist 24/7. See below for mobility/assist levels- generally able to mobilize at a min guard level with SPC and VC for navigation in environment due to vision impairment. She will likely demonstrate improved mobility in a more familiar environment given vision impairment/new setting. Left in bed with all needs met/bed alarm active and RN aware of patient status. Feel she is OK to return home once medically ready as long as she has adequate support from family.     Follow Up Recommendations Home health PT;Supervision/Assistance - 24 hour (as long as she really has 24/7A)    Equipment Recommendations  None recommended by PT (well equipped)    Recommendations for Other Services       Precautions / Restrictions Precautions Precautions: Fall;Other (comment) Precaution Comments: legally blind- one eye is completely gone, other eye has almost no vision Restrictions Weight Bearing Restrictions: No      Mobility  Bed Mobility Overal bed mobility: Independent             General bed mobility comments: increased effort/extended time  Transfers Overall transfer level: Needs assistance Equipment used: Straight cane Transfers: Sit to/from Stand Sit to Stand: Min guard         General transfer comment: min guard for safety, needed very light MinA for steadying at first but then gained  balance  Ambulation/Gait Ambulation/Gait assistance: Min guard Gait Distance (Feet): 40 Feet Assistive device: Straight cane Gait Pattern/deviations: Step-through pattern;Drifts right/left;Trunk flexed;Narrow base of support Gait velocity: decreased   General Gait Details: slow but steady with SPC- main gait deficit likely coming from impaired vision/low vision as she really did not have significant unsteadiness on her feet  Stairs            Wheelchair Mobility    Modified Rankin (Stroke Patients Only)       Balance Overall balance assessment: Mild deficits observed, not formally tested                                           Pertinent Vitals/Pain Pain Assessment: No/denies pain    Home Living Family/patient expects to be discharged to:: Private residence Living Arrangements: Alone Available Help at Discharge: Friend(s);Family;Available 24 hours/day Type of Home: House Home Access: Stairs to enter Entrance Stairs-Rails: None Entrance Stairs-Number of Steps: 2 Home Layout: One level Home Equipment: Walker - 2 wheels;Cane - single point Additional Comments: mostly uses cane, uses RW sometimes when she goes out    Prior Function Level of Independence: Independent with assistive device(s)               Hand Dominance        Extremity/Trunk Assessment   Upper Extremity Assessment Upper Extremity Assessment: Generalized weakness    Lower Extremity Assessment Lower Extremity Assessment: Generalized weakness    Cervical / Trunk Assessment  Cervical / Trunk Assessment: Kyphotic  Communication   Communication: No difficulties  Cognition Arousal/Alertness: Awake/alert Behavior During Therapy: WFL for tasks assessed/performed Overall Cognitive Status: History of cognitive impairments - at baseline                                 General Comments: hx of dementia at baseline with some inconsistencies in PLOF/history-  states she lives alone but has friends/family available 24/7      General Comments      Exercises     Assessment/Plan    PT Assessment Patient needs continued PT services  PT Problem List Decreased strength;Decreased activity tolerance;Decreased safety awareness;Decreased balance;Decreased mobility;Decreased coordination       PT Treatment Interventions DME instruction;Balance training;Gait training;Stair training;Functional mobility training;Patient/family education;Therapeutic activities;Therapeutic exercise    PT Goals (Current goals can be found in the Care Plan section)  Acute Rehab PT Goals Patient Stated Goal: go home PT Goal Formulation: With patient Time For Goal Achievement: 04/08/20 Potential to Achieve Goals: Good    Frequency Min 3X/week   Barriers to discharge        Co-evaluation               AM-PAC PT "6 Clicks" Mobility  Outcome Measure Help needed turning from your back to your side while in a flat bed without using bedrails?: None Help needed moving from lying on your back to sitting on the side of a flat bed without using bedrails?: None Help needed moving to and from a bed to a chair (including a wheelchair)?: A Little Help needed standing up from a chair using your arms (e.g., wheelchair or bedside chair)?: A Little Help needed to walk in hospital room?: A Little Help needed climbing 3-5 steps with a railing? : A Little 6 Click Score: 20    End of Session Equipment Utilized During Treatment: Gait belt Activity Tolerance: Patient tolerated treatment well Patient left: in bed;with call bell/phone within reach;with bed alarm set (all 4 rails up per her request) Nurse Communication: Mobility status PT Visit Diagnosis: Unsteadiness on feet (R26.81);Difficulty in walking, not elsewhere classified (R26.2);Muscle weakness (generalized) (M62.81)    Time: 7972-8206 PT Time Calculation (min) (ACUTE ONLY): 13 min   Charges:   PT Evaluation $PT  Eval Low Complexity: 1 Low          Windell Norfolk, DPT, PN1   Supplemental Physical Therapist [place]    Pager [phone] Acute Rehab Office [phone]

## 2020-03-25 NOTE — Progress Notes (Signed)
  Echocardiogram 2D Echocardiogram has been performed.  Jordan [name] 03/25/2020, 10:27 AM

## 2020-03-25 NOTE — Progress Notes (Signed)
Pt c/o chest pain at times after eating or drinking. Then moments later pt is resting in bed with no further complaints. Night shift MD notified and instructed to notify day shift MD.  Will cont to monitor.

## 2020-03-25 NOTE — Care Management Obs Status (Signed)
MEDICARE OBSERVATION STATUS NOTIFICATION   Patient Details  Name: Jordan [name] MRN: [id number] Date of Birth: [birth year]   Medicare Observation Status Notification Given:  Yes    Kingsley Plan, RN 03/25/2020, 4:43 PM

## 2020-03-25 NOTE — Progress Notes (Signed)
PROGRESS NOTE    Jordan [name]  WGN:[id number] DOB: [birth year] DOA: 03/24/2020 PCP: Arnette Felts, FNP   Brief Narrative: Jordan [name] is a 86 y.o. 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 Patient presented secondary to chest pain and malaise and found to have hypotension with concern for possible UTI. ACS ruled out. Urine culture obtained for possible UTI.   Assessment & Plan:   Principal Problem:   Hypotension Active Problems:   History of hypothyroidism   Alzheimer's disease (HCC)   Hypotension Unknown etiology. Patient is on amlodipine which has been discontinued. Concern for possible UTI which could have contributed. Blood cultures with no growth to date. Urine culture pending. BP has improved today. Transthoracic Echocardiogram unremarkable for significant valve disease -Watch BP -Orthostatic vitals in AM -PT/OT recommendations: HH with 24 hour supervision  Chest pain Appears to be completely resolved. No evidence for ACS. Flat troponin  Abnormal urinalysis Concern for possible UTI. Patient with increased frequency but no dysuria. Started empirically on Levaquin secondary to penicillin allergy -Continue Levaquin  Hyperlipidemia -Continue simvastatin  Hypothyroidism Patient is on Synthroid 100 mcg daily except no dose on Sunday. TSH is significantly low at 0.071. -Reduce to Synthroid 88 mcg daily  Dementia Depression -Continue Remeron, Namenda, Aricept  GERD -Continue Protonix   DVT prophylaxis: Lovenox Code Status:   Code Status: Full Code Family Communication: Son at bedside Disposition Plan: Discharge home likely in 1 day pending urine culture results and transition to oral antibiotics in addition to stable BP   Consultants:   None  Procedures:   TRANSTHORACIC ECHOCARDIOGRAM (03/25/2020) IMPRESSIONS    1. Left ventricular ejection fraction, by estimation, is 60 to  65%. The  left ventricle has normal function. The left ventricle has no regional  wall motion abnormalities. Left ventricular diastolic parameters were  normal.  2. Right ventricular systolic function is normal. The right ventricular  size is normal. There is normal pulmonary artery systolic pressure. The  estimated right ventricular systolic pressure is 26.0 mmHg.  3. The mitral valve is normal in structure. Trivial mitral valve  regurgitation. No evidence of mitral stenosis.  4. The aortic valve is normal in structure. Aortic valve regurgitation is  not visualized. No aortic stenosis is present.  5. The inferior vena cava is normal in size with greater than 50%  respiratory variability, suggesting right atrial pressure of 3 mmHg.   Antimicrobials:  Levaquin    Subjective: No issues this morning  Objective: Vitals:   03/24/20 2215 03/25/20 0200 03/25/20 0431 03/25/20 1209  BP: (!) 105/49 130/66 (!) 189/85 (!) 118/59  Pulse: 68 64 67 81  Resp: 20 14 16 16   Temp:   98 F (36.7 C) 98.5 F (36.9 C)  TempSrc:   Oral Oral  SpO2: 99% 100% 100% 98%  Weight:      Height:        Intake/Output Summary (Last 24 hours) at 03/25/2020 1721 Last data filed at 03/25/2020 1500 Gross per 24 hour  Intake 1160.91 ml  Output --  Net 1160.91 ml   Filed Weights   03/24/20 1200  Weight: 54.4 kg    Examination:  General exam: Appears calm and comfortable Respiratory system: Clear to auscultation. Respiratory effort normal. Cardiovascular system: S1 & S2 heard, RRR. No murmurs, rubs, gallops or clicks. Gastrointestinal system: Abdomen is nondistended, soft and nontender. No organomegaly or masses felt. Normal bowel sounds heard. Central nervous system: Alert and oriented to  person. No focal neurological deficits. Musculoskeletal: No edema. No calf tenderness Skin: No cyanosis. No rashes Psychiatry: Judgement and insight appear normal. Mood & affect appropriate.     Data Reviewed:  I have personally reviewed following labs and imaging studies  CBC Lab Results  Component Value Date   WBC 8.1 03/24/2020   RBC 5.10 03/24/2020   HGB 9.5 (L) 03/24/2020   HCT 28.0 (L) 03/24/2020   MCV 73.7 (L) 03/24/2020   MCH 23.3 (L) 03/24/2020   PLT 237 03/24/2020   MCHC 31.6 03/24/2020   RDW 17.7 (H) 03/24/2020   LYMPHSABS 3.2 03/24/2020   MONOABS 0.8 03/24/2020   EOSABS 0.1 03/24/2020   BASOSABS 0.1 03/24/2020     Last metabolic panel Lab Results  Component Value Date   NA 141 03/24/2020   K 3.9 03/24/2020   CL 110 03/24/2020   CO2 26 03/24/2020   BUN 27 (H) 03/24/2020   CREATININE 1.30 (H) 03/24/2020   GLUCOSE 81 03/24/2020   GFRNONAA 30 (L) 03/24/2020   GFRAA 35 (L) 03/24/2020   CALCIUM 9.8 03/24/2020   PROT 7.1 03/24/2020   ALBUMIN 3.8 03/24/2020   BILITOT 0.4 03/24/2020   ALKPHOS 54 03/24/2020   AST 23 03/24/2020   ALT 17 03/24/2020   ANIONGAP 11 03/24/2020    CBG (last 3)  No results for input(s): GLUCAP in the last 72 hours.   GFR: Estimated Creatinine Clearance: 23.1 mL/min (A) (by C-G formula based on SCr of 1.3 mg/dL (H)).  Coagulation Profile: Recent Labs  Lab 03/24/20 1250  INR 1.1    Recent Results (from the past 240 hour(s))  Culture, blood (Routine x 2)     Status: None (Preliminary result)   Collection Time: 03/24/20 12:59 PM   Specimen: BLOOD  Result Value Ref Range Status   Specimen Description BLOOD RIGHT ANTECUBITAL  Final   Special Requests   Final    BOTTLES DRAWN AEROBIC ONLY Blood Culture adequate volume   Culture   Final    NO GROWTH < 24 HOURS Performed at [facility], [address], [place], [place] [address]    Report Status PENDING  Incomplete  SARS Coronavirus 2 by RT PCR (hospital order, performed in [facility] hospital lab) Nasopharyngeal Nasopharyngeal Swab     Status: None   Collection Time: 03/24/20  1:43 PM   Specimen: Nasopharyngeal Swab  Result Value Ref Range Status   SARS Coronavirus 2  NEGATIVE NEGATIVE Final    Comment: (NOTE) SARS-CoV-2 target nucleic acids are NOT DETECTED.  The SARS-CoV-2 RNA is generally detectable in upper and lower respiratory specimens during the acute phase of infection. The lowest concentration of SARS-CoV-2 viral copies this assay can detect is 250 copies / mL. A negative result does not preclude SARS-CoV-2 infection and should not be used as the sole basis for treatment or other patient management decisions.  A negative result may occur with improper specimen collection / handling, submission of specimen other than nasopharyngeal swab, presence of viral mutation(s) within the areas targeted by this assay, and inadequate number of viral copies (<250 copies / mL). A negative result must be combined with clinical observations, patient history, and epidemiological information.  Fact Sheet for Patients:   BoilerBrush.com.cy  Fact Sheet for Healthcare Providers: https://pope.com/  This test is not yet approved or  cleared by the [place] FDA and has been authorized for detection and/or diagnosis of SARS-CoV-2 by FDA under an Emergency Use Authorization (EUA).  This  EUA will remain in effect (meaning this test can be used) for the duration of the COVID-19 declaration under Section 564(b)(1) of the Act, 21 U.S.C. section 360bbb-3(b)(1), unless the authorization is terminated or revoked sooner.  Performed at [facility], [address] [place], [place] [address]   Culture, blood (Routine x 2)     Status: None (Preliminary result)   Collection Time: 03/24/20  2:10 PM   Specimen: BLOOD  Result Value Ref Range Status   Specimen Description BLOOD RIGHT ANTECUBITAL  Final   Special Requests   Final    BOTTLES DRAWN AEROBIC AND ANAEROBIC Blood Culture results may not be optimal due to an inadequate volume of blood received in culture bottles   Culture   Final    NO GROWTH < 24  HOURS Performed at [facility], [address], [place], [place] [address]    Report Status PENDING  Incomplete        Radiology Studies: DG Chest 2 View  Result Date: 03/24/2020 CLINICAL DATA:  Suspected sepsis. EXAM: CHEST - 2 VIEW COMPARISON:  03/31/2015 chest radiograph and CTA chest. FINDINGS: Mild cardiomegaly. No focal consolidation. No pneumothorax or pleural effusion. Mediastinal contours within normal limits. Aortic atherosclerotic calcifications. No acute osseous abnormality. IMPRESSION: No focal airspace disease. Mild cardiomegaly. Electronically Signed   By: Stana Bunting M.D.   On: 03/24/2020 13:32   CT Head Wo Contrast  Result Date: 03/24/2020 CLINICAL DATA:  Mental status change, unknown cause. Additional provided: Weakness, hypotension, back pain. EXAM: CT HEAD WITHOUT CONTRAST TECHNIQUE: Contiguous axial images were obtained from the base of the skull through the vertex without intravenous contrast. COMPARISON:  Head CT 12/04/2019, brain MRI 05/18/2017. FINDINGS: Brain: Stable, mild generalized parenchymal atrophy. Stable, mild ill-defined hypoattenuation within the cerebral white matter which is nonspecific, but consistent with chronic small vessel ischemic disease. There is no acute intracranial hemorrhage. No demarcated cortical infarct. No extra-axial fluid collection. No evidence of intracranial mass. No midline shift. Vascular: No hyperdense vessel atherosclerotic calcifications Skull: Normal. Negative for fracture or focal lesion. Sinuses/Orbits: Right-sided glaucoma valve. Visualized orbits show no acute finding. Minimal ethmoid sinus mucosal thickening. IMPRESSION: No CT evidence of acute intracranial abnormality. Stable mild generalized parenchymal atrophy and chronic small vessel ischemic disease. Minimal ethmoid sinus mucosal thickening. Electronically Signed   By: Jackey Loge DO   On: 03/24/2020 16:37   ECHOCARDIOGRAM COMPLETE  Result Date: 03/25/2020     ECHOCARDIOGRAM REPORT   Patient Name:   WANZA SZUMSKI Date of Exam: 03/25/2020 Medical Rec #:  [id number]        Height:       60.0 in Accession #:    [id number]       Weight:       120.0 lb Date of Birth:  [birth year]         BSA:          1.502 m Patient Age:    86 years         BP:           189/85 mmHg Patient Gender: F                HR:           60 bpm. Exam Location:  Inpatient Procedure: 2D Echo Indications:    chest pain  History:        Patient has no prior history of Echocardiogram examinations.  Stroke; Risk Factors:Hypertension, Dyslipidemia and Former                 Smoker.  Sonographer:    Celene Skeen RDCS (AE) Referring Phys: [id number] CHING T TU IMPRESSIONS  1. Left ventricular ejection fraction, by estimation, is 60 to 65%. The left ventricle has normal function. The left ventricle has no regional wall motion abnormalities. Left ventricular diastolic parameters were normal.  2. Right ventricular systolic function is normal. The right ventricular size is normal. There is normal pulmonary artery systolic pressure. The estimated right ventricular systolic pressure is 26.0 mmHg.  3. The mitral valve is normal in structure. Trivial mitral valve regurgitation. No evidence of mitral stenosis.  4. The aortic valve is normal in structure. Aortic valve regurgitation is not visualized. No aortic stenosis is present.  5. The inferior vena cava is normal in size with greater than 50% respiratory variability, suggesting right atrial pressure of 3 mmHg. FINDINGS  Left Ventricle: Left ventricular ejection fraction, by estimation, is 60 to 65%. The left ventricle has normal function. The left ventricle has no regional wall motion abnormalities. The left ventricular internal cavity size was normal in size. There is  no left ventricular hypertrophy. Left ventricular diastolic parameters were normal. Indeterminate filling pressures. Right Ventricle: The right ventricular size is normal. No increase in  right ventricular wall thickness. Right ventricular systolic function is normal. There is normal pulmonary artery systolic pressure. The tricuspid regurgitant velocity is 2.40 m/s, and  with an assumed right atrial pressure of 3 mmHg, the estimated right ventricular systolic pressure is 26.0 mmHg. Left Atrium: Left atrial size was normal in size. Right Atrium: Right atrial size was normal in size. Pericardium: There is no evidence of pericardial effusion. Mitral Valve: The mitral valve is normal in structure. Normal mobility of the mitral valve leaflets. Mild mitral annular calcification. Trivial mitral valve regurgitation. No evidence of mitral valve stenosis. Tricuspid Valve: The tricuspid valve is normal in structure. Tricuspid valve regurgitation is trivial. No evidence of tricuspid stenosis. Aortic Valve: The aortic valve is normal in structure. Aortic valve regurgitation is not visualized. No aortic stenosis is present. Pulmonic Valve: The pulmonic valve was normal in structure. Pulmonic valve regurgitation is not visualized. No evidence of pulmonic stenosis. Aorta: The aortic root is normal in size and structure. Venous: The inferior vena cava is normal in size with greater than 50% respiratory variability, suggesting right atrial pressure of 3 mmHg. IAS/Shunts: The interatrial septum appears to be lipomatous. No atrial level shunt detected by color flow Doppler.  LEFT VENTRICLE PLAX 2D LVIDd:         4.50 cm  Diastology LVIDs:         2.60 cm  LV e' lateral:   6.74 cm/s LV PW:         0.70 cm  LV E/e' lateral: 11.9 LV IVS:        0.70 cm  LV e' medial:    6.42 cm/s LVOT diam:     1.90 cm  LV E/e' medial:  12.5 LV SV:         83 LV SV Index:   55 LVOT Area:     2.84 cm  RIGHT VENTRICLE RV S prime:     17.70 cm/s TAPSE (M-mode): 1.6 cm LEFT ATRIUM             Index       RIGHT ATRIUM  Index LA diam:        2.90 cm 1.93 cm/m  RA Area:     13.00 cm LA Vol (A2C):   36.3 ml 24.16 ml/m RA Volume:    29.30 ml  19.50 ml/m LA Vol (A4C):   46.0 ml 30.62 ml/m LA Biplane Vol: 43.2 ml 28.76 ml/m  AORTIC VALVE LVOT Vmax:   142.00 cm/s LVOT Vmean:  92.000 cm/s LVOT VTI:    0.293 m  AORTA Ao Root diam: 2.40 cm MITRAL VALVE               TRICUSPID VALVE MV Area (PHT): 3.12 cm    TR Peak grad:   23.0 mmHg MV Decel Time: 243 msec    TR Vmax:        240.00 cm/s MV E velocity: 80.10 cm/s MV A velocity: 66.80 cm/s  SHUNTS MV E/A ratio:  1.20        Systemic VTI:  0.29 m                            Systemic Diam: 1.90 cm Thurmon Fair MD Electronically signed by Thurmon Fair MD Signature Date/Time: 03/25/2020/11:03:28 AM    Final    CT Angio Chest/Abd/Pel for Dissection W and/or W/WO  Result Date: 03/24/2020 CLINICAL DATA:  Chest pain and back pain. EXAM: CT ANGIOGRAPHY CHEST, ABDOMEN AND PELVIS TECHNIQUE: Non-contrast CT of the chest was initially obtained. Multidetector CT imaging through the chest, abdomen and pelvis was performed using the standard protocol during bolus administration of intravenous contrast. Multiplanar reconstructed images and MIPs were obtained and reviewed to evaluate the vascular anatomy. CONTRAST:  61mL OMNIPAQUE IOHEXOL 350 MG/ML SOLN COMPARISON:  March 31, 2015 FINDINGS: CTA CHEST FINDINGS Cardiovascular: There is moderate severity calcification of the aortic arch. The ascending thoracic aorta measures approximately 3.2 cm in diameter, while the distal descending thoracic aorta measures approximately 2.8 cm x 2.6 cm. Satisfactory opacification of the pulmonary arteries to the segmental level. No evidence of pulmonary embolism. The main pulmonary artery measures 3.5 cm. Normal heart size. No pericardial effusion. Marked severity coronary artery calcification is noted Mediastinum/Nodes: No enlarged mediastinal, hilar, or axillary lymph nodes. Thyroid gland, trachea, and esophagus demonstrate no significant findings. Lungs/Pleura: Mild atelectasis and/or infiltrate is seen within the left  lower lobe. There is no evidence of a pleural effusion or pneumothorax. Musculoskeletal: Multilevel degenerative changes seen throughout the thoracic spine. Review of the MIP images confirms the above findings. CTA ABDOMEN AND PELVIS FINDINGS VASCULAR Aorta: There is moderate to marked severity calcification of the abdominal aorta with and infrarenal abdominal aortic measurement of 2.4 cm x 2.3 cm. There is no evidence of dissection, vasculitis or significant stenosis. Celiac: Patent without evidence of aneurysm, dissection, vasculitis or significant stenosis. SMA: Patent without evidence of aneurysm, dissection, vasculitis or significant stenosis. Renals: Both renal arteries are patent without evidence of aneurysm, dissection, vasculitis, fibromuscular dysplasia or significant stenosis. IMA: Patent without evidence of aneurysm, dissection, vasculitis or significant stenosis. Inflow: Moderate to marked severity calcification without evidence of aneurysm, dissection, vasculitis or significant stenosis. Veins: No obvious venous abnormality within the limitations of this arterial phase study. Review of the MIP images confirms the above findings. NON-VASCULAR Hepatobiliary: No focal liver abnormality is seen. Status post cholecystectomy. No biliary dilatation. Pancreas: Unremarkable. No pancreatic ductal dilatation or surrounding inflammatory changes. Spleen: Normal in size without focal abnormality. Adrenals/Urinary Tract: Adrenal glands are unremarkable. Kidneys are normal,  without renal calculi, focal lesion, or hydronephrosis. Bladder is unremarkable. Stomach/Bowel: Stomach is within normal limits. The appendix is not clearly identified. No evidence of bowel wall thickening, distention, or inflammatory changes. Numerous noninflamed diverticula are seen throughout the large bowel. Lymphatic: No abnormal abdominal or pelvic lymph nodes are identified. Reproductive: Status post hysterectomy. No adnexal masses. Other:  No abdominal wall hernia or abnormality. No abdominopelvic ascites. Musculoskeletal: Multilevel degenerative changes seen throughout the lumbar spine. Review of the MIP images confirms the above findings. IMPRESSION: 1. Mild left lower lobe atelectasis and/or infiltrate. 2. No evidence of aortic dissection or aneurysm. 3. Marked severity coronary artery calcification. 4. Enlarged main pulmonary artery which may be indicative of pulmonary arterial hypertension. 5. Colonic diverticulosis without evidence of diverticulitis. 6. Aortic atherosclerosis. Aortic Atherosclerosis (ICD10-I70.0). Electronically Signed   By: Aram Candela M.D.   On: 03/24/2020 16:49        Scheduled Meds: . aspirin EC  81 mg Oral Daily  . memantine  28 mg Oral Daily   And  . donepezil  10 mg Oral Daily  . enoxaparin (LOVENOX) injection  30 mg Subcutaneous Q24H  . feeding supplement (ENSURE ENLIVE)  237 mL Oral Q1500  . ferrous sulfate  325 mg Oral Q breakfast  . folic acid  1 mg Oral Daily  . mirtazapine  30 mg Oral QHS  . pantoprazole  40 mg Oral Daily  . simvastatin  40 mg Oral QHS   Continuous Infusions: . levofloxacin (LEVAQUIN) IV Stopped (03/24/20 2239)     LOS: 0 days     Jacquelin Hawking, MD [facility] 03/25/2020, 5:21 PM  If 7PM-7AM, please contact night-coverage [email or web address]

## 2020-03-26 DIAGNOSIS — I959 Hypotension, unspecified: Secondary | ICD-10-CM | POA: Diagnosis not present

## 2020-03-26 DIAGNOSIS — Z8639 Personal history of other endocrine, nutritional and metabolic disease: Secondary | ICD-10-CM | POA: Diagnosis not present

## 2020-03-26 DIAGNOSIS — R079 Chest pain, unspecified: Secondary | ICD-10-CM | POA: Diagnosis not present

## 2020-03-26 LAB — BASIC METABOLIC PANEL
Anion gap: 8 (ref 5–15)
BUN: 18 mg/dL (ref 8–23)
CO2: 20 mmol/L — ABNORMAL LOW (ref 22–32)
Calcium: 8.5 mg/dL — ABNORMAL LOW (ref 8.9–10.3)
Chloride: 112 mmol/L — ABNORMAL HIGH (ref 98–111)
Creatinine, Ser: 1.01 mg/dL — ABNORMAL HIGH (ref 0.44–1.00)
GFR calc Af Amer: 59 mL/min — ABNORMAL LOW (ref >60)
GFR calc non Af Amer: 51 mL/min — ABNORMAL LOW (ref >60)
Glucose, Bld: 120 mg/dL — ABNORMAL HIGH (ref 70–99)
Potassium: 3.6 mmol/L (ref 3.5–5.1)
Sodium: 140 mmol/L (ref 135–145)

## 2020-03-26 LAB — LIPID PANEL
Cholesterol: 156 mg/dL (ref 0–200)
HDL: 57 mg/dL (ref >40)
LDL Cholesterol: 86 mg/dL (ref 0–99)
Total CHOL/HDL Ratio: 2.7 RATIO
Triglycerides: 66 mg/dL (ref <150)
VLDL: 13 mg/dL (ref 0–40)

## 2020-03-26 MED ORDER — LEVOTHYROXINE SODIUM 88 MCG PO TABS: 88.0000 ug | ORAL_TABLET | Freq: Every day | ORAL | 0 refills | Status: DC

## 2020-03-26 MED ORDER — ENSURE ENLIVE PO LIQD: 237.0000 mL | Freq: Every day | ORAL | Status: AC

## 2020-03-26 NOTE — Consult Note (Signed)
Cardiology Consultation:   Patient ID: Jordan [name]; [id number]; [birth year]   Admit date: 03/24/2020 Date of Consult: 03/26/2020  Primary Care Provider: Arnette Felts, FNP Primary Cardiologist: No primary care provider on file. Primary Electrophysiologist:  None    Patient Profile:   Jordan [name] is a 86 y.o. 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 Caleb Popp.  History of Present Illness:   [name] was in her usual state of health until the early morning hours of 03/24/20 when she was awoken from sleep with substernal sharp, non-radiating chest pain. She was reported to have some associated SOB. She presented to her PCP office later that day and was noted to be hypotensive, therefore sent to the ED for further evaluation.  She has no known cardiac history. Son reports prior to 2016 she was living in [place], [place], however when she went blind, she moved to live with him in [place]. Son believes she underwent a stress test prior to moving, though does not recall how long ago. Patient thinks she had a younger sister who suffered a heart attack, though reports no other family history of CAD/CHF in parents/siblings/children.  On my evaluation, patient is chest pain free. Son at bedside describes episodes of chest pain ~1x per week which exclusively occur following a meal or when laying down at night following a big meal. He states symptoms are never associated with diaphoresis, SOB, dizziness, lightheadedness, syncope, nausea, or vomiting. Often times she will have a big belch and symptoms will resolve or she will take pepcid and symptoms improve. She occasionally has the sensation of food getting stuck in her esophagus. Her son reports that she ambulates easily with assistance but activity has certainly been limited  since losing her vision. She denies any chest pain with activity, SOB with activity or rest, palpitations, dizziness, lightheadedness, syncope, LE edema, orthopnea, or PND.   Hospital course: hypotensive to the 70s/50s on arrival which has resolved, now generally hypertensive, otherwise VSS. Labs notable for Cr 1.55>1.01 s/p IVFs (baseline 0.8 03/04/20), lactate 3.1>0.9 (s/p IVFs), Hgb 10.8>9.5, PLT 237, TSH 0.07, FT4 1.17, HsTrop 16>13>14>9. EKG 03/24/20 with sinus bradycardia, rate 57, non-specific ST-T wave abnormalities; no significant change from previous. EKG 03/25/20 in the setting of chest pain is essentially unchanged. UA with positive nitrite and bacteria. UCx inconclusive with multiple species present. CTA C/A/P showed no evidence of aortic dissection/aneurysm, mild LLE atelectasis/infiltrate, markedly severe coronary artery calcifications, enlarged pulmonary artery c/f pulmonary artery HTN, and aortic atherosclerosis. Echocardiogram 03/25/20 showed EF 60-65%, normal LV diastolic function, no RWMA, normal PA pressures, and trivial MR.  She was started on IV antibiotics for presumed UTI. Home levothyroxine dose reduced given low TSH/elevated FT4. Amlodipine and lisinopril helddue to hypotension. Initially felt chest pain was not related to ACS given benign EKG and negative Trops, however she had a repeat episode of chest pain on the evening of 03/25/20 prompting a cardiology consult.   Past Medical History:  Diagnosis Date  . Allergy   . Anxiety    per pt's daughter  . Arthritis   . Asthma    pt reported  . Depression   . GERD (gastroesophageal reflux disease)   . Hearing deficit L  . High cholesterol   . Hypertension   . Osteoporosis   . Rheumatoid arthritis (HCC)   .  Stroke [facility])     Past Surgical History:  Procedure Laterality Date  . ABDOMINAL HYSTERECTOMY    . KNEE ARTHROPLASTY    . REPLACEMENT TOTAL KNEE BILATERAL Bilateral   . WRIST SURGERY Left      Home Medications:  Prior  to Admission medications   Medication Sig Start Date End Date Taking? Authorizing Provider  albuterol (VENTOLIN HFA) 108 (90 Base) MCG/ACT inhaler Inhale 1 puff into the lungs every 6 ([name]) hours as needed for wheezing or shortness of breath. 06/03/19  Yes Arnette Felts, FNP  ALLERGY RELIEF 10 MG tablet TAKE 1 TABLET BY MOUTH EVERY DAY Patient taking differently: Take 10 mg by mouth daily.  01/13/20  Yes Arnette Felts, FNP  amLODipine (NORVASC) 5 MG tablet Take 1 tablet (5 mg total) by mouth daily. 12/03/19  Yes Arnette Felts, FNP  aspirin EC 81 MG tablet Take 81 mg by mouth daily. Swallow whole.   Yes [provider]  brimonidine-timolol (COMBIGAN) 0.2-0.5 % ophthalmic solution Place 1 drop into both eyes every 12 (twelve) hours.   Yes [provider]  ENBREL SURECLICK 50 MG/ML injection Inject 0.98 mLs (50 mg total) into the skin once a week. 03/04/20  Yes Deveshwar, Janalyn Rouse, MD  ferrous sulfate 325 (65 FE) MG tablet Take 325 mg by mouth daily with breakfast.   Yes [provider]  fluticasone (FLONASE) 50 MCG/ACT nasal spray INSTILL 2 SPRAYS IN EACH NOSTRIL EVERY DAY Patient taking differently: Place 2 sprays into both nostrils daily as needed for allergies.  06/12/18  Yes Arnette Felts, FNP  folic acid (FOLVITE) 1 MG tablet TAKE 1 TABLET(1 MG) BY MOUTH DAILY Patient taking differently: Take 1 mg by mouth daily.  07/12/18  Yes Deveshwar, Janalyn Rouse, MD  latanoprost (XALATAN) 0.005 % ophthalmic solution Place 1 drop into both eyes at bedtime.  10/24/16  Yes [provider]  lisinopril (ZESTRIL) 30 MG tablet TAKE 1 TABLET BY MOUTH EVERY DAY Patient taking differently: Take 30 mg by mouth daily.  10/30/19  Yes Arnette Felts, FNP  mirtazapine (REMERON) 30 MG tablet TAKE 1 TABLET BY MOUTH AT BEDTIME Patient taking differently: Take 30 mg by mouth at bedtime.  03/17/20  Yes Arnette Felts, FNP  NAMZARIC 28-10 MG CP24 TAKE 1 CAPSULE BY MOUTH EVERY DAY Patient taking differently:  Take 1 capsule by mouth daily.  01/13/20  Yes Arnette Felts, FNP  omeprazole (PRILOSEC) 20 MG capsule TAKE 1 CAPSULE BY MOUTH EVERY DAY Patient taking differently: Take 20 mg by mouth daily. TAKE 1 CAPSULE BY MOUTH EVERY DAY 11/27/19  Yes Arnette Felts, FNP  simvastatin (ZOCOR) 40 MG tablet TAKE 1 TABLET BY MOUTH AT BEDTIME Patient taking differently: Take 40 mg by mouth at bedtime.  10/30/19  Yes Arnette Felts, FNP  SYNTHROID 100 MCG tablet TAKE 1 TABLET BY MOUTH EVERY DAY BEFORE BREAKFAST Patient taking differently: Take 100 mcg by mouth See admin instructions. Takes daily except on Sundays 01/13/20  Yes Arnette Felts, FNP    Inpatient Medications: Scheduled Meds: . aspirin EC  81 mg Oral Daily  . memantine  28 mg Oral Daily   And  . donepezil  10 mg Oral Daily  . enoxaparin (LOVENOX) injection  30 mg Subcutaneous Q24H  . feeding supplement (ENSURE ENLIVE)  237 mL Oral Q1500  . ferrous sulfate  325 mg Oral Q breakfast  . folic acid  1 mg Oral Daily  . levothyroxine  88 mcg Oral Q0600  . mirtazapine  30 mg Oral  QHS  . pantoprazole  40 mg Oral Daily  . simvastatin  40 mg Oral QHS   Continuous Infusions: . sodium chloride 75 mL/hr at 03/25/20 2237  . levofloxacin (LEVAQUIN) IV 250 mg (03/25/20 2020)   PRN Meds: acetaminophen, albuterol, HYDROmorphone (DILAUDID) injection, ondansetron (ZOFRAN) IV  Allergies:    Allergies  Allergen Reactions  . Penicillins Hives    Social History:   Social History   Socioeconomic History  . Marital status: Single    Spouse name: Not on file  . Number of children: 2  . Years of education: 8th  . Highest education level: Not on file  Occupational History  . Occupation: Retired  Tobacco Use  . Smoking status: Former Smoker    Packs/day: 0.25    Years: 13.00    Pack years: 3.25    Types: Cigarettes    Quit date: 08/18/2006    Years since quitting: 13.6  . Smokeless tobacco: Never Used  Vaping Use  . Vaping Use: Never used  Substance and  Sexual Activity  . Alcohol use: No  . Drug use: Not Currently    Types: Hydrocodone  . Sexual activity: Not Currently  Other Topics Concern  . Not on file  Social History Narrative   Lives at home with grandson.   Right-handed.   2-3 cups coffee per day.   Social Determinants of Health   Financial Resource Strain: Low Risk   . Difficulty of Paying Living Expenses: Not hard at all  Food Insecurity: No Food Insecurity  . Worried About Programme researcher, broadcasting/film/video in the Last Year: Never true  . Ran Out of Food in the Last Year: Never true  Transportation Needs: No Transportation Needs  . Lack of Transportation (Medical): No  . Lack of Transportation (Non-Medical): No  Physical Activity: Inactive  . Days of Exercise per Week: 0 days  . Minutes of Exercise per Session: 0 min  Stress: No Stress Concern Present  . Feeling of Stress : Not at all  Social Connections:   . Frequency of Communication with Friends and Family: Not on file  . Frequency of Social Gatherings with Friends and Family: Not on file  . Attends Religious Services: Not on file  . Active Member of Clubs or Organizations: Not on file  . Attends Banker Meetings: Not on file  . Marital Status: Not on file  Intimate Partner Violence:   . Fear of Current or Ex-Partner: Not on file  . Emotionally Abused: Not on file  . Physically Abused: Not on file  . Sexually Abused: Not on file    Family History:   History reviewed. No pertinent family history.   ROS:  Please see the history of present illness.  ROS  All other ROS reviewed and negative.     Physical Exam/Data:   Vitals:   03/25/20 1818 03/25/20 2011 03/26/20 0500 03/26/20 0900  BP: (!) 106/58 (!) 154/76 (!) 147/65 (!) 162/69  Pulse: 61 61 62 (!) 59  Resp: 16 16 16    Temp: 98.1 F (36.7 C) 98.6 F (37 C) 98.4 F (36.9 C)   TempSrc: Oral Oral Oral   SpO2: 100% 99% 100%   Weight:      Height:        Intake/Output Summary (Last 24 hours) at  03/26/2020 1006 Last data filed at 03/26/2020 0330 Gross per 24 hour  Intake 563.86 ml  Output --  Net 563.86 ml   [employer]  03/24/20 1200  Weight: 54.4 kg   Body mass index is 23.44 kg/m.  General:  Elderly female laying in bed sleeping on arrival, awakens easily, in no acute distress HEENT: sclera anicteric  Neck: no JVD Vascular: No carotid bruits; distal pulses 2+ bilaterally Cardiac:  normal S1, S2; RRR; no murmurs, rubs, or gallops Lungs:  clear to auscultation bilaterally, no wheezing, rhonchi or rales  Abd: NABS, soft, nontender, no hepatomegaly Ext: no edema Musculoskeletal:  No deformities, BUE and BLE strength normal and equal Skin: warm and dry  Neuro:  CNs 2-12 intact, no focal abnormalities noted Psych:  Normal affect   EKG:  The EKG was personally reviewed and demonstrates:  03/24/20 with sinus bradycardia, rate 57, non-specific ST-T wave abnormalities; no significant change from previous. EKG 03/25/20 in the setting of chest pain is essentially unchanged. Telemetry:  Telemetry was personally reviewed and demonstrates:  Sinus rhythm with occasional PVCs.  Relevant CV Studies:  Echocardiogram 03/25/20: 1. Left ventricular ejection fraction, by estimation, is 60 to 65%. The  left ventricle has normal function. The left ventricle has no regional  wall motion abnormalities. Left ventricular diastolic parameters were  normal.  2. Right ventricular systolic function is normal. The right ventricular  size is normal. There is normal pulmonary artery systolic pressure. The  estimated right ventricular systolic pressure is 26.0 mmHg.  3. The mitral valve is normal in structure. Trivial mitral valve  regurgitation. No evidence of mitral stenosis.  4. The aortic valve is normal in structure. Aortic valve regurgitation is  not visualized. No aortic stenosis is present.  5. The inferior vena cava is normal in size with greater than 50%  respiratory variability,  suggesting right atrial pressure of 3 mmHg.   Laboratory Data:  Chemistry Recent Labs  Lab 03/24/20 1250 03/24/20 1250 03/24/20 1432 03/25/20 2020 03/26/20 0728  NA 141   < > 141 140 140  K 5.1   < > 3.9 4.1 3.6  CL 104   < > 110 112* 112*  CO2 26  --   --  19* 20*  GLUCOSE 108*   < > 81 94 120*  BUN 28*   < > 27* 23 18  CREATININE 1.55*   < > 1.30* 1.47* 1.01*  CALCIUM 9.8  --   --  8.7* 8.5*  GFRNONAA 30*  --   --  32* 51*  GFRAA 35*  --   --  38* 59*  ANIONGAP 11  --   --  9 8   < > = values in this interval not displayed.    Recent Labs  Lab 03/24/20 1250  PROT 7.1  ALBUMIN 3.8  AST 23  ALT 17  ALKPHOS 54  BILITOT 0.4   Hematology Recent Labs  Lab 03/24/20 1250 03/24/20 1432  WBC 8.1  --   RBC 5.10  --   HGB 11.9* 9.5*  HCT 37.6 28.0*  MCV 73.7*  --   MCH 23.3*  --   MCHC 31.6  --   RDW 17.7*  --   PLT 237  --    Cardiac EnzymesNo results for input(s): TROPONINI in the last 168 hours. No results for input(s): TROPIPOC in the last 168 hours.  BNPNo results for input(s): BNP, PROBNP in the last 168 hours.  DDimer No results for input(s): DDIMER in the last 168 hours.  Radiology/Studies:  DG Chest 2 View  Result Date: 03/24/2020 CLINICAL DATA:  Suspected sepsis. EXAM: CHEST - 2 VIEW  COMPARISON:  03/31/2015 chest radiograph and CTA chest. FINDINGS: Mild cardiomegaly. No focal consolidation. No pneumothorax or pleural effusion. Mediastinal contours within normal limits. Aortic atherosclerotic calcifications. No acute osseous abnormality. IMPRESSION: No focal airspace disease. Mild cardiomegaly. Electronically Signed   By: Stana Bunting M.D.   On: 03/24/2020 13:32   CT Head Wo Contrast  Result Date: 03/24/2020 CLINICAL DATA:  Mental status change, unknown cause. Additional provided: Weakness, hypotension, back pain. EXAM: CT HEAD WITHOUT CONTRAST TECHNIQUE: Contiguous axial images were obtained from the base of the skull through the vertex without  intravenous contrast. COMPARISON:  Head CT 12/04/2019, brain MRI 05/18/2017. FINDINGS: Brain: Stable, mild generalized parenchymal atrophy. Stable, mild ill-defined hypoattenuation within the cerebral white matter which is nonspecific, but consistent with chronic small vessel ischemic disease. There is no acute intracranial hemorrhage. No demarcated cortical infarct. No extra-axial fluid collection. No evidence of intracranial mass. No midline shift. Vascular: No hyperdense vessel atherosclerotic calcifications Skull: Normal. Negative for fracture or focal lesion. Sinuses/Orbits: Right-sided glaucoma valve. Visualized orbits show no acute finding. Minimal ethmoid sinus mucosal thickening. IMPRESSION: No CT evidence of acute intracranial abnormality. Stable mild generalized parenchymal atrophy and chronic small vessel ischemic disease. Minimal ethmoid sinus mucosal thickening. Electronically Signed   By: Jackey Loge DO   On: 03/24/2020 16:37   ECHOCARDIOGRAM COMPLETE  Result Date: 03/25/2020    ECHOCARDIOGRAM REPORT   Patient Name:   EDWIN CHERIAN Date of Exam: 03/25/2020 Medical Rec #:  [id number]        Height:       60.0 in Accession #:    [id number]       Weight:       120.0 lb Date of Birth:  [birth year]         BSA:          1.502 m Patient Age:    86 years         BP:           189/85 mmHg Patient Gender: F                HR:           60 bpm. Exam Location:  Inpatient Procedure: 2D Echo Indications:    chest pain  History:        Patient has no prior history of Echocardiogram examinations.                 Stroke; Risk Factors:Hypertension, Dyslipidemia and Former                 Smoker.  Sonographer:    Celene Skeen RDCS (AE) Referring Phys: [id number] CHING T TU IMPRESSIONS  1. Left ventricular ejection fraction, by estimation, is 60 to 65%. The left ventricle has normal function. The left ventricle has no regional wall motion abnormalities. Left ventricular diastolic parameters were normal.  2. Right  ventricular systolic function is normal. The right ventricular size is normal. There is normal pulmonary artery systolic pressure. The estimated right ventricular systolic pressure is 26.0 mmHg.  3. The mitral valve is normal in structure. Trivial mitral valve regurgitation. No evidence of mitral stenosis.  4. The aortic valve is normal in structure. Aortic valve regurgitation is not visualized. No aortic stenosis is present.  5. The inferior vena cava is normal in size with greater than 50% respiratory variability, suggesting right atrial pressure of 3 mmHg. FINDINGS  Left Ventricle: Left ventricular ejection fraction, by estimation, is 60 to 65%. The  left ventricle has normal function. The left ventricle has no regional wall motion abnormalities. The left ventricular internal cavity size was normal in size. There is  no left ventricular hypertrophy. Left ventricular diastolic parameters were normal. Indeterminate filling pressures. Right Ventricle: The right ventricular size is normal. No increase in right ventricular wall thickness. Right ventricular systolic function is normal. There is normal pulmonary artery systolic pressure. The tricuspid regurgitant velocity is 2.40 m/s, and  with an assumed right atrial pressure of 3 mmHg, the estimated right ventricular systolic pressure is 26.0 mmHg. Left Atrium: Left atrial size was normal in size. Right Atrium: Right atrial size was normal in size. Pericardium: There is no evidence of pericardial effusion. Mitral Valve: The mitral valve is normal in structure. Normal mobility of the mitral valve leaflets. Mild mitral annular calcification. Trivial mitral valve regurgitation. No evidence of mitral valve stenosis. Tricuspid Valve: The tricuspid valve is normal in structure. Tricuspid valve regurgitation is trivial. No evidence of tricuspid stenosis. Aortic Valve: The aortic valve is normal in structure. Aortic valve regurgitation is not visualized. No aortic stenosis is  present. Pulmonic Valve: The pulmonic valve was normal in structure. Pulmonic valve regurgitation is not visualized. No evidence of pulmonic stenosis. Aorta: The aortic root is normal in size and structure. Venous: The inferior vena cava is normal in size with greater than 50% respiratory variability, suggesting right atrial pressure of 3 mmHg. IAS/Shunts: The interatrial septum appears to be lipomatous. No atrial level shunt detected by color flow Doppler.  LEFT VENTRICLE PLAX 2D LVIDd:         4.50 cm  Diastology LVIDs:         2.60 cm  LV e' lateral:   6.74 cm/s LV PW:         0.70 cm  LV E/e' lateral: 11.9 LV IVS:        0.70 cm  LV e' medial:    6.42 cm/s LVOT diam:     1.90 cm  LV E/e' medial:  12.5 LV SV:         83 LV SV Index:   55 LVOT Area:     2.84 cm  RIGHT VENTRICLE RV S prime:     17.70 cm/s TAPSE (M-mode): 1.6 cm LEFT ATRIUM             Index       RIGHT ATRIUM           Index LA diam:        2.90 cm 1.93 cm/m  RA Area:     13.00 cm LA Vol (A2C):   36.3 ml 24.16 ml/m RA Volume:   29.30 ml  19.50 ml/m LA Vol (A4C):   46.0 ml 30.62 ml/m LA Biplane Vol: 43.2 ml 28.76 ml/m  AORTIC VALVE LVOT Vmax:   142.00 cm/s LVOT Vmean:  92.000 cm/s LVOT VTI:    0.293 m  AORTA Ao Root diam: 2.40 cm MITRAL VALVE               TRICUSPID VALVE MV Area (PHT): 3.12 cm    TR Peak grad:   23.0 mmHg MV Decel Time: 243 msec    TR Vmax:        240.00 cm/s MV E velocity: 80.10 cm/s MV A velocity: 66.80 cm/s  SHUNTS MV E/A ratio:  1.20        Systemic VTI:  0.29 m  Systemic Diam: 1.90 cm Thurmon Fair MD Electronically signed by Thurmon Fair MD Signature Date/Time: 03/25/2020/11:03:28 AM    Final    CT Angio Chest/Abd/Pel for Dissection W and/or W/WO  Result Date: 03/24/2020 CLINICAL DATA:  Chest pain and back pain. EXAM: CT ANGIOGRAPHY CHEST, ABDOMEN AND PELVIS TECHNIQUE: Non-contrast CT of the chest was initially obtained. Multidetector CT imaging through the chest, abdomen and pelvis was  performed using the standard protocol during bolus administration of intravenous contrast. Multiplanar reconstructed images and MIPs were obtained and reviewed to evaluate the vascular anatomy. CONTRAST:  23mL OMNIPAQUE IOHEXOL 350 MG/ML SOLN COMPARISON:  March 31, 2015 FINDINGS: CTA CHEST FINDINGS Cardiovascular: There is moderate severity calcification of the aortic arch. The ascending thoracic aorta measures approximately 3.2 cm in diameter, while the distal descending thoracic aorta measures approximately 2.8 cm x 2.6 cm. Satisfactory opacification of the pulmonary arteries to the segmental level. No evidence of pulmonary embolism. The main pulmonary artery measures 3.5 cm. Normal heart size. No pericardial effusion. Marked severity coronary artery calcification is noted Mediastinum/Nodes: No enlarged mediastinal, hilar, or axillary lymph nodes. Thyroid gland, trachea, and esophagus demonstrate no significant findings. Lungs/Pleura: Mild atelectasis and/or infiltrate is seen within the left lower lobe. There is no evidence of a pleural effusion or pneumothorax. Musculoskeletal: Multilevel degenerative changes seen throughout the thoracic spine. Review of the MIP images confirms the above findings. CTA ABDOMEN AND PELVIS FINDINGS VASCULAR Aorta: There is moderate to marked severity calcification of the abdominal aorta with and infrarenal abdominal aortic measurement of 2.4 cm x 2.3 cm. There is no evidence of dissection, vasculitis or significant stenosis. Celiac: Patent without evidence of aneurysm, dissection, vasculitis or significant stenosis. SMA: Patent without evidence of aneurysm, dissection, vasculitis or significant stenosis. Renals: Both renal arteries are patent without evidence of aneurysm, dissection, vasculitis, fibromuscular dysplasia or significant stenosis. IMA: Patent without evidence of aneurysm, dissection, vasculitis or significant stenosis. Inflow: Moderate to marked severity  calcification without evidence of aneurysm, dissection, vasculitis or significant stenosis. Veins: No obvious venous abnormality within the limitations of this arterial phase study. Review of the MIP images confirms the above findings. NON-VASCULAR Hepatobiliary: No focal liver abnormality is seen. Status post cholecystectomy. No biliary dilatation. Pancreas: Unremarkable. No pancreatic ductal dilatation or surrounding inflammatory changes. Spleen: Normal in size without focal abnormality. Adrenals/Urinary Tract: Adrenal glands are unremarkable. Kidneys are normal, without renal calculi, focal lesion, or hydronephrosis. Bladder is unremarkable. Stomach/Bowel: Stomach is within normal limits. The appendix is not clearly identified. No evidence of bowel wall thickening, distention, or inflammatory changes. Numerous noninflamed diverticula are seen throughout the large bowel. Lymphatic: No abnormal abdominal or pelvic lymph nodes are identified. Reproductive: Status post hysterectomy. No adnexal masses. Other: No abdominal wall hernia or abnormality. No abdominopelvic ascites. Musculoskeletal: Multilevel degenerative changes seen throughout the lumbar spine. Review of the MIP images confirms the above findings. IMPRESSION: 1. Mild left lower lobe atelectasis and/or infiltrate. 2. No evidence of aortic dissection or aneurysm. 3. Marked severity coronary artery calcification. 4. Enlarged main pulmonary artery which may be indicative of pulmonary arterial hypertension. 5. Colonic diverticulosis without evidence of diverticulitis. 6. Aortic atherosclerosis. Aortic Atherosclerosis (ICD10-I70.0). Electronically Signed   By: Aram Candela M.D.   On: 03/24/2020 16:49    Assessment and Plan:   1. Chest pain in patient with markedly severe coronary artery calcifications on CT scan: patient initially presented with hypotension. EKG was non-ischemic and HsTrop was negative x3. Recurrent chest pain on the evening of 03/25/20  after eating dinner again with benign EKG and negative HsTrop x1. Echo this admission looks great - EF 60-65%, normal LV diastolic function, no RWMA, and no significant valvular abnormalities. She had a CTA C/A/P which was without aortic dissection but showed markedly severe coronary artery calcifications and aortic atherosclerosis. Contributing to her CAD includes longstanding history of HTN, HLD, and age. Her hospital course has been complicated by AKI which is improving with IVFs. Given degree of hypotension on arrival, would have expected a bump in troponin if she had evidence of ischemia.  - No indication for further cardiac testing - Would resume home amlodipine - Continue statin - will add on a lipid panel to AM labs  2. HTN: BP low on admission, improved with IVFs. Likely 2/2 UTI/dehydration. Now generally hypertensive. Home amlodipine and lisinopril on hold.  - Favor restarting home amlodipine and lisinopril   3. HLD: no lipids on file - Will add on lipid panel to AM labs - Continue simvastatin  4. GERD: likely the cause of her chest pain which exclusively occurs after eating or laying down shortly after eating a big meal. Also notes occasional sensation of food getting stuck when swallowing.  - May need to ramp up her antacid regimen - will defer to primary team  We will arrange outpatient follow-up in 3-4 months for ongoing cardiology monitoring.    For questions or updates, please contact [facility] Please consult [email or web address] for contact info under Cardiology/STEMI.   Signed, Beatriz Stallion, PA-C  03/26/2020 10:06 AM [phone]

## 2020-03-26 NOTE — Evaluation (Addendum)
Occupational Therapy Evaluation Patient Details Name: Jordan [name] MRN: [id number] DOB: 04/18/1934 Today's Date: 03/26/2020    History of Present Illness 86yo female c/o general malaise, SOB, and chest pain. Chest pain resolved with Nitropaste. Found to be hypotensive at her PCP and was referred to the ED. PMH HOH, HTN, OA, CVA, B TKR, dementia, legally blind   Clinical Impression   PTA patient reports having 24/7 support from grand daughter and aide, supervision for mobility and ADls but no physical assist required. Admitted for above and limited by problem list below, including generalized weakness and decreased activity tolerance.  Patient currently requires min guard for ADLs and mobility in room using cane, increased verbal cueing due to baseline visual deficits.  Noted cognitive deficits at baseline as well, but patient engaging appropriately during session--son present and confirms PLOF and home setup.Orthostatics negative, see vitals flow sheet. Patient will benefit from continued OT services while admitted and after dc at [facility] level to optimize independence and return to PLOF.  Will follow.       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, other eye has almost no vision Restrictions Weight Bearing Restrictions: No      Mobility Bed Mobility Overal bed mobility: Independent                Transfers Overall transfer level: Needs assistance Equipment used: Straight cane Transfers: Sit to/from Stand Sit to Stand: Min guard         General transfer comment: min guard for safety/balance    Balance Overall balance assessment: Mild deficits observed, not formally tested                                          ADL either performed or assessed with clinical judgement   ADL Overall ADL's : Needs assistance/impaired     Grooming: Min guard;Standing   Upper Body Bathing: Set up;Sitting   Lower Body Bathing: Min guard;Sit to/from stand   Upper Body Dressing : Set up;Sitting   Lower Body Dressing: Min guard;Sit to/from stand Lower Body Dressing Details (indicate cue type and reason): able to don socks, min guard sit to stand  Toilet Transfer: Min guard;Ambulation (cane ) Toilet Transfer Details (indicate cue type and reason): simulated in room          Functional mobility during ADLs: Min guard;Cueing for safety General ADL Comments: pt requires increased cues for visual deficits, overall min guard for safety/balance     Vision Baseline Vision/History: Legally blind       Perception     Praxis      Pertinent Vitals/Pain Pain Assessment: No/denies pain     Hand Dominance     Extremity/Trunk Assessment Upper Extremity Assessment Upper Extremity Assessment: Generalized weakness   Lower Extremity Assessment Lower Extremity Assessment: Defer to PT evaluation   Cervical / Trunk Assessment Cervical / Trunk Assessment: Kyphotic   Communication Communication Communication: No difficulties   Cognition Arousal/Alertness: Awake/alert Behavior During Therapy: WFL for tasks assessed/performed Overall Cognitive Status: History of cognitive impairments - at baseline  General Comments: hisotry of dementia at baseline, son present and confirms PLOF/home setup. Pt following commands and engaging appropriately.   General Comments  son present and supportive     Exercises     Shoulder Instructions      Home Living Family/patient expects to be discharged to:: Private residence Living Arrangements: Other relatives (granddaughter ) Available Help at Discharge: Family;Personal care attendant;Available 24  hours/day Type of Home: House Home Access: Stairs to enter [employer] of Steps: 2 Entrance Stairs-Rails: None Home Layout: One level     Bathroom Shower/Tub: Chief Strategy Officer: Standard     Home Equipment: Environmental consultant - 2 wheels;Cane - single point;Tub bench   Additional Comments: mostly uses cane, uses RW sometimes when she goes out      Prior Functioning/Environment Level of Independence: Independent with assistive device(s)        Comments: using cane for mobility, family provides supervision but no physical assist; setup for ADLs/bathing in shower         OT Problem List: Decreased strength;Decreased activity tolerance;Impaired balance (sitting and/or standing);Decreased knowledge of precautions;Decreased safety awareness      OT Treatment/Interventions: Self-care/ADL training;DME and/or AE instruction;Therapeutic activities;Patient/family education;Balance training    OT Goals(Current goals can be found in the care plan section) Acute Rehab OT Goals Patient Stated Goal: go home OT Goal Formulation: With patient Time For Goal Achievement: 04/09/20 Potential to Achieve Goals: Good  OT Frequency: Min 2X/week   Barriers to D/C:            Co-evaluation              AM-PAC OT "6 Clicks" Daily Activity     Outcome Measure Help from another person eating meals?: A Little Help from another person taking care of personal grooming?: A Little Help from another person toileting, which includes using toliet, bedpan, or urinal?: A Little Help from another person bathing (including washing, rinsing, drying)?: A Little Help from another person to put on and taking off regular upper body clothing?: A Little Help from another person to put on and taking off regular lower body clothing?: A Little 6 Click Score: 18   End of Session Equipment Utilized During Treatment: Gait belt Nurse Communication: Mobility status  Activity Tolerance: Patient  tolerated treatment well Patient left: in bed;with call bell/phone within reach;with bed alarm set;with family/visitor present  OT Visit Diagnosis: Other abnormalities of gait and mobility (R26.89);Muscle weakness (generalized) (M62.81)                Time: 4656-8127 OT Time Calculation (min): 20 min Charges:  OT General Charges $OT Visit: 1 Visit OT Evaluation $OT Eval Low Complexity: 1 Low  Barry Brunner, OT Acute Rehabilitation Services Pager [phone] Office [phone]   Chancy Milroy 03/26/2020, 10:15 AM

## 2020-03-26 NOTE — Consult Note (Signed)
   [facility] CM Inpatient Consult   03/26/2020  Jordan [name] 02-Jun-1934 [id number]  [facility] [[facility]]  Accountable Care Organization [ACO] Patient:  Jordan [name] and Embedded Chronic Care Management [ECCM] at [facility]  Alerted of the patient hospital admission by [facility]  Patient is currently active with chronic disease management services with the Embedded Team.  Then Embedded community based plan of care has focused on disease management and community resource support.    Patient will receive a post hospital call and will be evaluated for assessments and disease process education at the Embedded practice for follow up.    Plan: Review of medical record reveals patient will also have home health follow up and will update CCM.  Of note, [facility] Care Management services does not replace or interfere with any services that are needed or arranged by inpatient [facility] care management team.  For additional questions or referrals please contact:  Charlesetta Shanks, RN BSN CCM [facility]  [phone] business mobile phone Toll free office [phone]  Fax number: [phone] [email or web address]

## 2020-03-26 NOTE — Discharge Summary (Signed)
Physician Discharge Summary  [name] WJX:[id number] DOB: 11-23-1933 DOA: 03/24/2020  PCP: Arnette Felts, FNP  Admit date: 03/24/2020 Discharge date: 03/26/2020  Admitted From: Home Disposition: Home  Recommendations for Outpatient Follow-up:  1. Follow up with PCP in 1 week 2. Follow up with cardiology 3. Please follow up on the following pending results: None  Home Health: PT/OT Equipment/Devices: None  Discharge Condition: Stable CODE STATUS: Full code Diet recommendation: Heart healthy   Brief/Interim Summary:  Admission HPI written by Anselm Jungling, DO   Chief Complaint: chest pain and generalized malaise  HPI: Jordan [name] is a 86 y.o. 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  Son at bedside states that she was otherwise in her normal state of health last night.  However she states that some time during the middle the night she awoke with acute midsternal sharp chest pain that is nonradiating.  Has been constant and has now just resolved after getting Nitropaste.  Also has some associated shortness of breath.  Denies any coughing, runny nose or fever.  No nausea, vomiting.  Has had good appetite.  Denies any abdominal pain.  Denies any dysuria, increased urgency or frequency.  No sick contact.  She received her Covid vaccine about 2 months ago.  Patient presented to primary care physician today with the symptoms and they had difficulty finding her pulse and she was noted to be hypotensive and was sent to ED for eval  ED Course: She was afebrile, hypotensive down to systolic of 70s over 50s on room air. CBC showed no leukocytosis, mild microcytic anemia with hemoglobin of 11.9.  Has AKI with creatinine 1.55 from a prior of 0.82.  Initial lactic acidosis of 3.1 that improved with fluid down to 0.9. Initial troponin of 16 and down to 13.  EKG with borderline ST depression in the anteriolateral lead.   Hospital course:  Hypotension Unknown etiology. Patient is on amlodipine which has been discontinued. Concern for possible UTI which could have contributed. Blood cultures with no growth to date. Urine culture pending. BP has improved today. Transthoracic Echocardiogram unremarkable for significant valve disease. PT/OT recommendation for [facility] with 24 hour supervision. Discontinued lisinopril on discharge.  Essential hypertension Complicated by above. BP moderately controlled. Will hold lisinopril on discharge and recommend follow-up prio rto restarting.  Chest pain Recurrent pressure-like pain. No evidence for ACS. Flat troponin. With atherosclerotic disease, Cardiology was consulted buy story was more consistent with possible reflux disease.  Abnormal urinalysis Concern for possible UTI. Patient with increased frequency but no dysuria. Started empirically on Levaquin secondary to penicillin allergy. Urine culture with multiple species. Patient received two doses but will not continue treatment.  Hyperlipidemia CAD CT scan significant for moderate-severe aortic atherosclerotic disease. Continue simvastatin. Cardiology follow-up outpatient.  Hypothyroidism Patient is on Synthroid 100 mcg daily except no dose on Sunday. TSH is significantly low at 0.071. Reduce to Synthroid 88 mcg daily.  Dementia Depression Continue Remeron, Namenda, Aricept  GERD Continue Protonix  Discharge Diagnoses:  Principal Problem:   Hypotension Active Problems:   History of hypothyroidism   Alzheimer's disease (HCC)   Chest pain    Discharge Instructions  Discharge Instructions    Diet - low sodium heart healthy   Complete by: As directed    Increase activity slowly   Complete by: As directed      Allergies as of 03/26/2020  Reactions   Penicillins Hives      Medication List    STOP taking these medications     lisinopril 30 MG tablet Commonly known as: ZESTRIL     TAKE these medications   albuterol 108 (90 Base) MCG/ACT inhaler Commonly known as: VENTOLIN HFA Inhale 1 puff into the lungs every 6 (six) hours as needed for wheezing or shortness of breath.   Allergy Relief 10 MG tablet Generic drug: loratadine TAKE 1 TABLET BY MOUTH EVERY DAY What changed: how much to take   amLODipine 5 MG tablet Commonly known as: NORVASC Take 1 tablet (5 mg total) by mouth daily.   aspirin EC 81 MG tablet Take 81 mg by mouth daily. Swallow whole.   Combigan 0.2-0.5 % ophthalmic solution Generic drug: brimonidine-timolol Place 1 drop into both eyes every 12 (twelve) hours.   Enbrel SureClick 50 MG/ML injection Generic drug: etanercept Inject 0.98 mLs (50 mg total) into the skin once a week.   feeding supplement (ENSURE ENLIVE) Liqd Take 237 mLs by mouth daily in the afternoon.   ferrous sulfate 325 (65 FE) MG tablet Take 325 mg by mouth daily with breakfast.   fluticasone 50 MCG/ACT nasal spray Commonly known as: FLONASE INSTILL 2 SPRAYS IN EACH NOSTRIL EVERY DAY What changed: See the new instructions.   folic acid 1 MG tablet Commonly known as: FOLVITE TAKE 1 TABLET(1 MG) BY MOUTH DAILY What changed: See the new instructions.   latanoprost 0.005 % ophthalmic solution Commonly known as: XALATAN Place 1 drop into both eyes at bedtime.   levothyroxine 88 MCG tablet Commonly known as: SYNTHROID Take 1 tablet (88 mcg total) by mouth daily at 6 (six) AM. Give on an empty stomach, at least 30 minutes before eating. Start taking on: March 27, 2020 What changed:   medication strength  See the new instructions.   mirtazapine 30 MG tablet Commonly known as: REMERON TAKE 1 TABLET BY MOUTH AT BEDTIME   Namzaric 28-10 MG Cp24 Generic drug: Memantine HCl-Donepezil HCl TAKE 1 CAPSULE BY MOUTH EVERY DAY What changed: how much to take   omeprazole 20 MG capsule Commonly known as:  PRILOSEC TAKE 1 CAPSULE BY MOUTH EVERY DAY What changed:   how much to take  additional instructions   simvastatin 40 MG tablet Commonly known as: ZOCOR TAKE 1 TABLET BY MOUTH AT BEDTIME       Follow-up Information    [facility].   Specialty: Home Health Services Contact information: [address] [place] [address] [phone]        Sande Rives, MD Follow up on 06/26/2020.   Specialties: Internal Medicine, Cardiology, Radiology Why: Please arrive 15 minutes early for your 10am cardiology appointment Contact information: [address] [place] [address] [phone]        Arnette Felts, FNP. Schedule an appointment as soon as possible for a visit in 1 week(s).   Specialty: General Practice Why: Hospital follow-up. Contact information: [address] [place] [address] [phone]              Allergies  Allergen Reactions  . Penicillins Hives    Consultations:  Cardiology   Procedures/Studies: DG Chest 2 View  Result Date: 03/24/2020 CLINICAL DATA:  Suspected sepsis. EXAM: CHEST - 2 VIEW COMPARISON:  03/31/2015 chest radiograph and CTA chest. FINDINGS: Mild cardiomegaly. No focal consolidation. No pneumothorax or pleural effusion. Mediastinal contours within normal limits. Aortic atherosclerotic calcifications.  No acute osseous abnormality. IMPRESSION: No focal airspace disease. Mild cardiomegaly. Electronically Signed   By: Stana Bunting M.D.   On: 03/24/2020 13:32   CT Head Wo Contrast  Result Date: 03/24/2020 CLINICAL DATA:  Mental status change, unknown cause. Additional provided: Weakness, hypotension, back pain. EXAM: CT HEAD WITHOUT CONTRAST TECHNIQUE: Contiguous axial images were obtained from the base of the skull through the vertex without intravenous contrast. COMPARISON:  Head CT 12/04/2019, brain MRI 05/18/2017. FINDINGS: Brain: Stable, mild generalized  parenchymal atrophy. Stable, mild ill-defined hypoattenuation within the cerebral white matter which is nonspecific, but consistent with chronic small vessel ischemic disease. There is no acute intracranial hemorrhage. No demarcated cortical infarct. No extra-axial fluid collection. No evidence of intracranial mass. No midline shift. Vascular: No hyperdense vessel atherosclerotic calcifications Skull: Normal. Negative for fracture or focal lesion. Sinuses/Orbits: Right-sided glaucoma valve. Visualized orbits show no acute finding. Minimal ethmoid sinus mucosal thickening. IMPRESSION: No CT evidence of acute intracranial abnormality. Stable mild generalized parenchymal atrophy and chronic small vessel ischemic disease. Minimal ethmoid sinus mucosal thickening. Electronically Signed   By: Jackey Loge DO   On: 03/24/2020 16:37   ECHOCARDIOGRAM COMPLETE  Result Date: 03/25/2020    ECHOCARDIOGRAM REPORT   Patient Name:   Jordan [name] Date of Exam: 03/25/2020 Medical Rec #:  [id number]        Height:       60.0 in Accession #:    [id number]       Weight:       120.0 lb Date of Birth:  October 20, 1933         BSA:          1.502 m Patient Age:    86 years         BP:           189/85 mmHg Patient Gender: F                HR:           60 bpm. Exam Location:  Inpatient Procedure: 2D Echo Indications:    chest pain  History:        Patient has no prior history of Echocardiogram examinations.                 Stroke; Risk Factors:Hypertension, Dyslipidemia and Former                 Smoker.  Sonographer:    Celene Skeen RDCS (AE) Referring Phys: [id number] CHING T TU IMPRESSIONS  1. Left ventricular ejection fraction, by estimation, is 60 to 65%. The left ventricle has normal function. The left ventricle has no regional wall motion abnormalities. Left ventricular diastolic parameters were normal.  2. Right ventricular systolic function is normal. The right ventricular size is normal. There is normal pulmonary artery systolic  pressure. The estimated right ventricular systolic pressure is 26.0 mmHg.  3. The mitral valve is normal in structure. Trivial mitral valve regurgitation. No evidence of mitral stenosis.  4. The aortic valve is normal in structure. Aortic valve regurgitation is not visualized. No aortic stenosis is present.  5. The inferior vena cava is normal in size with greater than 50% respiratory variability, suggesting right atrial pressure of 3 mmHg. FINDINGS  Left Ventricle: Left ventricular ejection fraction, by estimation, is 60 to 65%. The left ventricle has normal function. The left ventricle has no regional wall motion abnormalities. The left ventricular internal cavity size was normal in size. There is  no left ventricular hypertrophy. Left ventricular diastolic parameters were normal. Indeterminate filling pressures. Right Ventricle: The right ventricular size is normal. No increase in right ventricular wall thickness. Right ventricular systolic function is normal. There is normal pulmonary artery systolic pressure. The tricuspid regurgitant velocity is 2.40 m/s, and  with an assumed right atrial pressure of 3 mmHg, the estimated right ventricular systolic pressure is 26.0 mmHg. Left Atrium: Left atrial size was normal in size. Right Atrium: Right atrial size was normal in size. Pericardium: There is no evidence of pericardial effusion. Mitral Valve: The mitral valve is normal in structure. Normal mobility of the mitral valve leaflets. Mild mitral annular calcification. Trivial mitral valve regurgitation. No evidence of mitral valve stenosis. Tricuspid Valve: The tricuspid valve is normal in structure. Tricuspid valve regurgitation is trivial. No evidence of tricuspid stenosis. Aortic Valve: The aortic valve is normal in structure. Aortic valve regurgitation is not visualized. No aortic stenosis is present. Pulmonic Valve: The pulmonic valve was normal in structure. Pulmonic valve regurgitation is not visualized. No  evidence of pulmonic stenosis. Aorta: The aortic root is normal in size and structure. Venous: The inferior vena cava is normal in size with greater than 50% respiratory variability, suggesting right atrial pressure of 3 mmHg. IAS/Shunts: The interatrial septum appears to be lipomatous. No atrial level shunt detected by color flow Doppler.  LEFT VENTRICLE PLAX 2D LVIDd:         4.50 cm  Diastology LVIDs:         2.60 cm  LV e' lateral:   6.74 cm/s LV PW:         0.70 cm  LV E/e' lateral: 11.9 LV IVS:        0.70 cm  LV e' medial:    6.42 cm/s LVOT diam:     1.90 cm  LV E/e' medial:  12.5 LV SV:         83 LV SV Index:   55 LVOT Area:     2.84 cm  RIGHT VENTRICLE RV S prime:     17.70 cm/s TAPSE (M-mode): 1.6 cm LEFT ATRIUM             Index       RIGHT ATRIUM           Index LA diam:        2.90 cm 1.93 cm/m  RA Area:     13.00 cm LA Vol (A2C):   36.3 ml 24.16 ml/m RA Volume:   29.30 ml  19.50 ml/m LA Vol (A4C):   46.0 ml 30.62 ml/m LA Biplane Vol: 43.2 ml 28.76 ml/m  AORTIC VALVE LVOT Vmax:   142.00 cm/s LVOT Vmean:  92.000 cm/s LVOT VTI:    0.293 m  AORTA Ao Root diam: 2.40 cm MITRAL VALVE               TRICUSPID VALVE MV Area (PHT): 3.12 cm    TR Peak grad:   23.0 mmHg MV Decel Time: 243 msec    TR Vmax:        240.00 cm/s MV E velocity: 80.10 cm/s MV A velocity: 66.80 cm/s  SHUNTS MV E/A ratio:  1.20        Systemic VTI:  0.29 m                            Systemic Diam: 1.90 cm Rachelle Hora Croitoru MD Electronically signed by Thurmon Fair  MD Signature Date/Time: 03/25/2020/11:03:28 AM    Final    CT Angio Chest/Abd/Pel for Dissection W and/or W/WO  Result Date: 03/24/2020 CLINICAL DATA:  Chest pain and back pain. EXAM: CT ANGIOGRAPHY CHEST, ABDOMEN AND PELVIS TECHNIQUE: Non-contrast CT of the chest was initially obtained. Multidetector CT imaging through the chest, abdomen and pelvis was performed using the standard protocol during bolus administration of intravenous contrast. Multiplanar reconstructed  images and MIPs were obtained and reviewed to evaluate the vascular anatomy. CONTRAST:  75mL OMNIPAQUE IOHEXOL 350 MG/ML SOLN COMPARISON:  March 31, 2015 FINDINGS: CTA CHEST FINDINGS Cardiovascular: There is moderate severity calcification of the aortic arch. The ascending thoracic aorta measures approximately 3.2 cm in diameter, while the distal descending thoracic aorta measures approximately 2.8 cm x 2.6 cm. Satisfactory opacification of the pulmonary arteries to the segmental level. No evidence of pulmonary embolism. The main pulmonary artery measures 3.5 cm. Normal heart size. No pericardial effusion. Marked severity coronary artery calcification is noted Mediastinum/Nodes: No enlarged mediastinal, hilar, or axillary lymph nodes. Thyroid gland, trachea, and esophagus demonstrate no significant findings. Lungs/Pleura: Mild atelectasis and/or infiltrate is seen within the left lower lobe. There is no evidence of a pleural effusion or pneumothorax. Musculoskeletal: Multilevel degenerative changes seen throughout the thoracic spine. Review of the MIP images confirms the above findings. CTA ABDOMEN AND PELVIS FINDINGS VASCULAR Aorta: There is moderate to marked severity calcification of the abdominal aorta with and infrarenal abdominal aortic measurement of 2.4 cm x 2.3 cm. There is no evidence of dissection, vasculitis or significant stenosis. Celiac: Patent without evidence of aneurysm, dissection, vasculitis or significant stenosis. SMA: Patent without evidence of aneurysm, dissection, vasculitis or significant stenosis. Renals: Both renal arteries are patent without evidence of aneurysm, dissection, vasculitis, fibromuscular dysplasia or significant stenosis. IMA: Patent without evidence of aneurysm, dissection, vasculitis or significant stenosis. Inflow: Moderate to marked severity calcification without evidence of aneurysm, dissection, vasculitis or significant stenosis. Veins: No obvious venous abnormality  within the limitations of this arterial phase study. Review of the MIP images confirms the above findings. NON-VASCULAR Hepatobiliary: No focal liver abnormality is seen. Status post cholecystectomy. No biliary dilatation. Pancreas: Unremarkable. No pancreatic ductal dilatation or surrounding inflammatory changes. Spleen: Normal in size without focal abnormality. Adrenals/Urinary Tract: Adrenal glands are unremarkable. Kidneys are normal, without renal calculi, focal lesion, or hydronephrosis. Bladder is unremarkable. Stomach/Bowel: Stomach is within normal limits. The appendix is not clearly identified. No evidence of bowel wall thickening, distention, or inflammatory changes. Numerous noninflamed diverticula are seen throughout the large bowel. Lymphatic: No abnormal abdominal or pelvic lymph nodes are identified. Reproductive: Status post hysterectomy. No adnexal masses. Other: No abdominal wall hernia or abnormality. No abdominopelvic ascites. Musculoskeletal: Multilevel degenerative changes seen throughout the lumbar spine. Review of the MIP images confirms the above findings. IMPRESSION: 1. Mild left lower lobe atelectasis and/or infiltrate. 2. No evidence of aortic dissection or aneurysm. 3. Marked severity coronary artery calcification. 4. Enlarged main pulmonary artery which may be indicative of pulmonary arterial hypertension. 5. Colonic diverticulosis without evidence of diverticulitis. 6. Aortic atherosclerosis. Aortic Atherosclerosis (ICD10-I70.0). Electronically Signed   By: Aram Candela M.D.   On: 03/24/2020 16:49     Subjective: Pressure like pain overnight. Non-exertional. Relieved with time. No issues today.  Discharge Exam: Vitals:   03/26/20 0900 03/26/20 1211  BP: (!) 162/69 130/76  Pulse: (!) 59 (!) 52  Resp:  16  Temp:  98.2 F (36.8 C)  SpO2:  99%   Vitals:  03/25/20 2011 03/26/20 0500 03/26/20 0900 03/26/20 1211  BP: (!) 154/76 (!) 147/65 (!) 162/69 130/76  Pulse:  61 62 (!) 59 (!) 52  Resp: Temp: 98.6 F (37 C) 98.4 F (36.9 C)  98.2 F (36.8 C)  TempSrc: Oral Oral  Oral  SpO2: 99% 100%  99%  Weight:      Height:        General: Pt is alert, awake, not in acute distress Cardiovascular: RRR, S1/S2 +, no rubs, no gallops Respiratory: CTA bilaterally, no wheezing, no rhonchi Abdominal: Soft, NT, ND, bowel sounds + Extremities: no edema, no cyanosis    The results of significant diagnostics from this hospitalization (including imaging, microbiology, ancillary and laboratory) are listed below for reference.     Microbiology: Recent Results (from the past 240 hour(s))  Culture, blood (Routine x 2)     Status: None (Preliminary result)   Collection Time: 03/24/20 12:59 PM   Specimen: BLOOD  Result Value Ref Range Status   Specimen Description BLOOD RIGHT ANTECUBITAL  Final   Special Requests   Final    BOTTLES DRAWN AEROBIC ONLY Blood Culture adequate volume   Culture   Final    NO GROWTH 2 DAYS Performed at [facility], [address], [place], [place] [address]    Report Status PENDING  Incomplete  SARS Coronavirus 2 by RT PCR (hospital order, performed in [facility] hospital lab) Nasopharyngeal Nasopharyngeal Swab     Status: None   Collection Time: 03/24/20  1:43 PM   Specimen: Nasopharyngeal Swab  Result Value Ref Range Status   SARS Coronavirus 2 NEGATIVE NEGATIVE Final    Comment: (NOTE) SARS-CoV-2 target nucleic acids are NOT DETECTED.  The SARS-CoV-2 RNA is generally detectable in upper and lower respiratory specimens during the acute phase of infection. The lowest concentration of SARS-CoV-2 viral copies this assay can detect is 250 copies / mL. A negative result does not preclude SARS-CoV-2 infection and should not be used as the sole basis for treatment or other patient management decisions.  A negative result may occur with improper specimen collection / handling, submission of specimen other than  nasopharyngeal swab, presence of viral mutation(s) within the areas targeted by this assay, and inadequate number of viral copies (<250 copies / mL). A negative result must be combined with clinical observations, patient history, and epidemiological information.  Fact Sheet for Patients:   BoilerBrush.com.cy  Fact Sheet for Healthcare Providers: https://pope.com/  This test is not yet approved or  cleared by the [place] FDA and has been authorized for detection and/or diagnosis of SARS-CoV-2 by FDA under an Emergency Use Authorization (EUA).  This EUA will remain in effect (meaning this test can be used) for the duration of the COVID-19 declaration under Section 564(b)(1) of the Act, 21 U.S.C. section 360bbb-3(b)(1), unless the authorization is terminated or revoked sooner.  Performed at [facility], [address], [place], [place] [address]   Culture, blood (Routine x 2)     Status: None (Preliminary result)   Collection Time: 03/24/20  2:10 PM   Specimen: BLOOD  Result Value Ref Range Status   Specimen Description BLOOD RIGHT ANTECUBITAL  Final   Special Requests   Final    BOTTLES DRAWN AEROBIC AND ANAEROBIC Blood Culture results may not be optimal due to an inadequate volume of blood received in culture bottles   Culture   Final    NO GROWTH 2  DAYS Performed at [facility], [address] [place], [place] [address]    Report Status PENDING  Incomplete  Culture, Urine     Status: Abnormal   Collection Time: 03/24/20  6:11 PM   Specimen: Urine, Random  Result Value Ref Range Status   Specimen Description URINE, RANDOM  Final   Special Requests   Final    ADDED Performed at [facility], [address], [place], [place] [address]    Culture MULTIPLE SPECIES PRESENT, SUGGEST RECOLLECTION (A)  Final   Report Status 03/25/2020 FINAL  Final     Labs: BNP (last 3 results) No results for input(s):  BNP in the last 8760 hours. Basic Metabolic Panel: Recent Labs  Lab 03/24/20 1250 03/24/20 1432 03/25/20 2020 03/26/20 0728  NA 141 141 140 140  K 5.1 3.9 4.1 3.6  CL 104 110 112* 112*  CO2 26  --  19* 20*  GLUCOSE 108* 81 94 120*  BUN 28* 27* 23 18  CREATININE 1.55* 1.30* 1.47* 1.01*  CALCIUM 9.8  --  8.7* 8.5*   Liver Function Tests: Recent Labs  Lab 03/24/20 1250  AST 23  ALT 17  ALKPHOS 54  BILITOT 0.4  PROT 7.1  ALBUMIN 3.8   No results for input(s): LIPASE, AMYLASE in the last 168 hours. No results for input(s): AMMONIA in the last 168 hours. CBC: Recent Labs  Lab 03/24/20 1250 03/24/20 1432  WBC 8.1  --   NEUTROABS 3.9  --   HGB 11.9* 9.5*  HCT 37.6 28.0*  MCV 73.7*  --   PLT 237  --    Cardiac Enzymes: No results for input(s): CKTOTAL, CKMB, CKMBINDEX, TROPONINI in the last 168 hours. BNP: Invalid input(s): POCBNP CBG: No results for input(s): GLUCAP in the last 168 hours. D-Dimer No results for input(s): DDIMER in the last 72 hours. Hgb A1c No results for input(s): HGBA1C in the last 72 hours. Lipid Profile Recent Labs    03/26/20 1044  CHOL 156  HDL 57  LDLCALC 86  TRIG 66  CHOLHDL 2.7   Thyroid function studies Recent Labs    03/24/20 1354  TSH 0.071*   Anemia work up No results for input(s): VITAMINB12, FOLATE, FERRITIN, TIBC, IRON, RETICCTPCT in the last 72 hours. Urinalysis    Component Value Date/Time   COLORURINE YELLOW 03/24/2020 1811   APPEARANCEUR CLEAR 03/24/2020 1811   LABSPEC >1.046 (H) 03/24/2020 1811   PHURINE 5.0 03/24/2020 1811   GLUCOSEU NEGATIVE 03/24/2020 1811   HGBUR NEGATIVE 03/24/2020 1811   BILIRUBINUR NEGATIVE 03/24/2020 1811   BILIRUBINUR negative 02/21/2019 1607   KETONESUR 5 (A) 03/24/2020 1811   PROTEINUR NEGATIVE 03/24/2020 1811   UROBILINOGEN 2.0 (A) 02/21/2019 1607   NITRITE POSITIVE (A) 03/24/2020 1811   LEUKOCYTESUR NEGATIVE 03/24/2020 1811   Sepsis Labs Invalid input(s): PROCALCITONIN,   WBC,  LACTICIDVEN Microbiology Recent Results (from the past 240 hour(s))  Culture, blood (Routine x 2)     Status: None (Preliminary result)   Collection Time: 03/24/20 12:59 PM   Specimen: BLOOD  Result Value Ref Range Status   Specimen Description BLOOD RIGHT ANTECUBITAL  Final   Special Requests   Final    BOTTLES DRAWN AEROBIC ONLY Blood Culture adequate volume   Culture   Final    NO GROWTH 2 DAYS Performed at [facility], [address] [place], [place] [address]    Report Status PENDING  Incomplete  SARS Coronavirus  2 by RT PCR (hospital order, performed in [facility] hospital lab) Nasopharyngeal Nasopharyngeal Swab     Status: None   Collection Time: 03/24/20  1:43 PM   Specimen: Nasopharyngeal Swab  Result Value Ref Range Status   SARS Coronavirus 2 NEGATIVE NEGATIVE Final    Comment: (NOTE) SARS-CoV-2 target nucleic acids are NOT DETECTED.  The SARS-CoV-2 RNA is generally detectable in upper and lower respiratory specimens during the acute phase of infection. The lowest concentration of SARS-CoV-2 viral copies this assay can detect is 250 copies / mL. A negative result does not preclude SARS-CoV-2 infection and should not be used as the sole basis for treatment or other patient management decisions.  A negative result may occur with improper specimen collection / handling, submission of specimen other than nasopharyngeal swab, presence of viral mutation(s) within the areas targeted by this assay, and inadequate number of viral copies (<250 copies / mL). A negative result must be combined with clinical observations, patient history, and epidemiological information.  Fact Sheet for Patients:   BoilerBrush.com.cy  Fact Sheet for Healthcare Providers: https://pope.com/  This test is not yet approved or  cleared by the [place] FDA and has been authorized for detection and/or diagnosis of SARS-CoV-2 by FDA  under an Emergency Use Authorization (EUA).  This EUA will remain in effect (meaning this test can be used) for the duration of the COVID-19 declaration under Section 564(b)(1) of the Act, 21 U.S.C. section 360bbb-3(b)(1), unless the authorization is terminated or revoked sooner.  Performed at [facility], [address] [place], [place] [address]   Culture, blood (Routine x 2)     Status: None (Preliminary result)   Collection Time: 03/24/20  2:10 PM   Specimen: BLOOD  Result Value Ref Range Status   Specimen Description BLOOD RIGHT ANTECUBITAL  Final   Special Requests   Final    BOTTLES DRAWN AEROBIC AND ANAEROBIC Blood Culture results may not be optimal due to an inadequate volume of blood received in culture bottles   Culture   Final    NO GROWTH 2 DAYS Performed at [facility], [address], [place], [place] [address]    Report Status PENDING  Incomplete  Culture, Urine     Status: Abnormal   Collection Time: 03/24/20  6:11 PM   Specimen: Urine, Random  Result Value Ref Range Status   Specimen Description URINE, RANDOM  Final   Special Requests   Final    ADDED Performed at [facility], [address] [place], [place] [address]    Culture MULTIPLE SPECIES PRESENT, SUGGEST RECOLLECTION (A)  Final   Report Status 03/25/2020 FINAL  Final     SIGNED:   Jacquelin Hawking, MD [facility] 03/26/2020, 1:29 PM

## 2020-03-26 NOTE — Discharge Instructions (Signed)
Jordan [name],  You were in the hospital because of chest pain. This does not appear to be secondary to your heart but possibly related to reflux. I have attached information on foods to avoid. Please follow-up with the cardiologist as recommended in addition to your PCP. I have reduced your Synthroid dose to Synthroid 88 mcg daily.

## 2020-03-26 NOTE — Progress Notes (Signed)
Physical Therapy Treatment Patient Details Name: Jordan [name] MRN: [id number] DOB: 05/25/1934 Today's Date: 03/26/2020    History of Present Illness 86yo female c/o general malaise, SOB, and chest pain. Chest pain resolved with Nitropaste. Found to be hypotensive at her PCP and was referred to the ED. PMH HOH, HTN, OA, CVA, B TKR, dementia, legally blind    PT Comments    The pt is making good progress with mobility and was able to navigate in her room, bathroom, and hallway with use of a SPC and minG for safety. The pt had no LOB, but requires minG for safety due to poor vision in an unfamiliar area. The pt reports she has good assist from her niece at home, and will benefit from continued skilled PT at home to improve functional strength and stability to reduce risk of falls and fall-related injury following return home.     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, other eye has almost no vision Restrictions Weight Bearing Restrictions: No    Mobility  Bed Mobility Overal bed mobility: Independent             General bed mobility comments: increased effort/extended time  Transfers Overall transfer level: Needs assistance Equipment used: Straight cane Transfers: Sit to/from Stand Sit to Stand: Min guard         General transfer comment: min guard for safety/balance, due to poor vision, pt reaching and feeling for cane, assist to place cane in the pts hand  Ambulation/Gait Ambulation/Gait assistance: Min guard Gait Distance (Feet): 100 Feet Assistive device: Straight cane Gait Pattern/deviations: Step-through pattern;Drifts right/left;Trunk flexed;Narrow base of  support Gait velocity: 0.45 m/s Gait velocity interpretation: <1.8 ft/sec, indicate of risk for recurrent falls General Gait Details: slow but steady with frequent cues for direction due to poor vision. No LOB with mobility       Balance Overall balance assessment: Mild deficits observed, not formally tested                                          Cognition Arousal/Alertness: Awake/alert Behavior During Therapy: WFL for tasks assessed/performed Overall Cognitive Status: History of cognitive impairments - at baseline                                 General Comments: hisotry of dementia at baseline, son present and confirms PLOF/home setup. Pt following commands and engaging appropriately.      Exercises      General Comments General comments (skin integrity, edema, etc.): son present and supportive, pt reports her neice stays with her 24/7 to assist      Pertinent Vitals/Pain Pain Assessment: No/denies pain    Home Living Family/patient expects to be discharged to:: Private residence Living Arrangements: Other relatives (granddaughter ) Available Help at Discharge: Family;Personal care attendant;Available 24 hours/day Type of Home: House Home Access: Stairs to enter Entrance Stairs-Rails: None Home Layout: One level Home Equipment: Environmental consultant - 2 wheels;Cane - single point;Tub bench Additional Comments: mostly uses cane, uses RW sometimes when she goes  out    Prior Function Level of Independence: Independent with assistive device(s)      Comments: using cane for mobility, family provides supervision but no physical assist; setup for ADLs/bathing in shower    PT Goals (current goals can now be found in the care plan section) Acute Rehab PT Goals Patient Stated Goal: go home PT Goal Formulation: With patient Time For Goal Achievement: 04/08/20 Potential to Achieve Goals: Good Progress towards PT goals: Progressing toward goals     Frequency    Min 3X/week      PT Plan Current plan remains appropriate       AM-PAC PT "6 Clicks" Mobility   Outcome Measure  Help needed turning from your back to your side while in a flat bed without using bedrails?: None Help needed moving from lying on your back to sitting on the side of a flat bed without using bedrails?: None Help needed moving to and from a bed to a chair (including a wheelchair)?: A Little Help needed standing up from a chair using your arms (e.g., wheelchair or bedside chair)?: A Little Help needed to walk in hospital room?: A Little Help needed climbing 3-5 steps with a railing? : A Little 6 Click Score: 20    End of Session Equipment Utilized During Treatment: Gait belt Activity Tolerance: Patient tolerated treatment well Patient left: in bed;with call bell/phone within reach;with family/visitor present (sitting EOB with lunch) Nurse Communication: Mobility status PT Visit Diagnosis: Unsteadiness on feet (R26.81);Difficulty in walking, not elsewhere classified (R26.2);Muscle weakness (generalized) (M62.81)     Time: [id number] PT Time Calculation (min) (ACUTE ONLY): 18 min  Charges:  $Gait Training: 8-22 mins                     Rolm Baptise, PT, DPT   Acute Rehabilitation Department Pager #: [phone]   Gaetana Michaelis 03/26/2020, 11:55 AM

## 2020-03-26 NOTE — Progress Notes (Signed)
RN gave discharge paperwork to pt son DEE and he stated understanding.

## 2020-03-27 ENCOUNTER — Telehealth: Payer: Self-pay

## 2020-03-27 ENCOUNTER — Telehealth: Payer: Medicare Other

## 2020-03-27 NOTE — Telephone Encounter (Signed)
  Chronic Care Management   Outreach Note  03/27/2020 Name: Jordan [name] MRN: [id number] DOB: April 13, 1934  Referred by: Arnette Felts, FNP Reason for referral : Care Coordination   SW placed an unsuccessful outbound call to the patients daughter, Jordan [name] to assist with care coordination needs post discharge. SW left a HIPAA compliant voice message requesting a return call.  Follow Up Plan: The care management team will reach out to the patient again over the next 10 days.   Bevelyn Ngo, BSW, CDP Social Worker, Certified Dementia Practitioner [facility] Care Management [phone]

## 2020-03-29 LAB — CULTURE, BLOOD (ROUTINE X 2)
Culture: NO GROWTH
Culture: NO GROWTH
Special Requests: ADEQUATE

## 2020-04-01 DIAGNOSIS — H401113 Primary open-angle glaucoma, right eye, severe stage: Secondary | ICD-10-CM | POA: Diagnosis not present

## 2020-04-01 DIAGNOSIS — H44512 Absolute glaucoma, left eye: Secondary | ICD-10-CM | POA: Diagnosis not present

## 2020-04-01 DIAGNOSIS — H2013 Chronic iridocyclitis, bilateral: Secondary | ICD-10-CM | POA: Diagnosis not present

## 2020-04-02 ENCOUNTER — Ambulatory Visit: Payer: Medicare Other

## 2020-04-02 DIAGNOSIS — I959 Hypotension, unspecified: Secondary | ICD-10-CM

## 2020-04-02 DIAGNOSIS — F0280 Dementia in other diseases classified elsewhere without behavioral disturbance: Secondary | ICD-10-CM

## 2020-04-02 DIAGNOSIS — I10 Essential (primary) hypertension: Secondary | ICD-10-CM

## 2020-04-02 NOTE — Chronic Care Management (AMB) (Signed)
  Care Management    Consult Note  04/02/2020 Name: Jordan [name] MRN: [id number] DOB: August 25, 1933  Care management team received notification of patient's recent inpatient hospitalization related to hypotension .Based on review of health record, Jordan [name] is currently active in the embedded care coordination program. Outbound call placed to the patients daughter to assist with care coordination needs.   Review of patient status, including review of consultants reports, relevant laboratory and other test results, and collaboration with appropriate care team members and the patient's provider was performed as part of comprehensive patient evaluation and provision of chronic care management services.    No acute care coordination needs are identified at this time. The patient has an OV planned with primary provider Monday September 13.   Bevelyn Ngo, BSW, CDP Social Worker, Certified Dementia Practitioner [facility] Care Management [phone]

## 2020-04-03 NOTE — Progress Notes (Deleted)
Office Visit Note  Patient: Jordan [name]             Date of Birth: September 16, 1933           MRN: [id number]             PCP: Arnette Felts, FNP Referring: Arnette Felts, FNP Visit Date: 04/17/2020 Occupation: @GUAROCC @  Subjective:  No chief complaint on file.   History of Present Illness: Jordan [name] is a 86 y.o. female ***   Activities of Daily Living:  Patient reports morning stiffness for *** {minute/hour:19697}.   Patient {ACTIONS;DENIES/REPORTS:21021675::"Denies"} nocturnal pain.  Difficulty dressing/grooming: {ACTIONS;DENIES/REPORTS:21021675::"Denies"} Difficulty climbing stairs: {ACTIONS;DENIES/REPORTS:21021675::"Denies"} Difficulty getting out of chair: {ACTIONS;DENIES/REPORTS:21021675::"Denies"} Difficulty using hands for taps, buttons, cutlery, and/or writing: {ACTIONS;DENIES/REPORTS:21021675::"Denies"}  No Rheumatology ROS completed.   PMFS History:  Patient Active Problem List   Diagnosis Date Noted  . Chest pain   . Hypotension 03/24/2020  . Alzheimer's disease (HCC) 08/13/2019  . Essential hypertension 08/13/2018  . Acquired hypothyroidism 08/13/2018  . Rash and nonspecific skin eruption 08/13/2018  . Memory loss 04/12/2017  . Depression 04/12/2017  . Rheumatoid arthritis involving multiple sites with positive rheumatoid factor (HCC) 08/17/2016  . High risk medication use 08/17/2016  . H/O total knee replacement, bilateral 08/17/2016  . Spondylosis of lumbar region without myelopathy or radiculopathy 08/17/2016  . History of glaucoma/ patient is legally blind 08/17/2016  . History of gastroesophageal reflux (GERD) 08/17/2016  . History of diverticulosis 08/17/2016  . History of hypothyroidism 08/17/2016  . history of asthmatic bronchitis 08/17/2016  . History of memory loss 08/17/2016  . History of atherosclerosis 08/17/2016    Past Medical History:  Diagnosis Date  . Allergy   . Anxiety    per pt's daughter  . Arthritis   . Asthma    pt  reported  . Depression   . GERD (gastroesophageal reflux disease)   . Hearing deficit L  . High cholesterol   . Hypertension   . Osteoporosis   . Rheumatoid arthritis (HCC)   . Stroke [facility])     No family history on file. Past Surgical History:  Procedure Laterality Date  . ABDOMINAL HYSTERECTOMY    . KNEE ARTHROPLASTY    . REPLACEMENT TOTAL KNEE BILATERAL Bilateral   . WRIST SURGERY Left    Social History   Social History Narrative   Lives at home with grandson.   Right-handed.   2-3 cups coffee per day.   Immunization History  Administered Date(s) Administered  . Moderna SARS-COVID-2 Vaccination 09/26/2019, 10/24/2019     Objective: Vital Signs: There were no vitals taken for this visit.   Physical Exam   Musculoskeletal Exam: ***  CDAI Exam: CDAI Score: -- Patient Global: --; Provider Global: -- Swollen: --; Tender: -- Joint Exam 04/17/2020   No joint exam has been documented for this visit   There is currently no information documented on the homunculus. Go to the Rheumatology activity and complete the homunculus joint exam.  Investigation: No additional findings.  Imaging: DG Chest 2 View  Result Date: 03/24/2020 CLINICAL DATA:  Suspected sepsis. EXAM: CHEST - 2 VIEW COMPARISON:  03/31/2015 chest radiograph and CTA chest. FINDINGS: Mild cardiomegaly. No focal consolidation. No pneumothorax or pleural effusion. Mediastinal contours within normal limits. Aortic atherosclerotic calcifications. No acute osseous abnormality. IMPRESSION: No focal airspace disease. Mild cardiomegaly. Electronically Signed   By: Stana Bunting M.D.   On: 03/24/2020 13:32   CT Head Wo Contrast  Result Date: 03/24/2020 CLINICAL DATA:  Mental status change, unknown cause. Additional provided: Weakness, hypotension, back pain. EXAM: CT HEAD WITHOUT CONTRAST TECHNIQUE: Contiguous axial images were obtained from the base of the skull through the vertex without intravenous contrast.  COMPARISON:  Head CT 12/04/2019, brain MRI 05/18/2017. FINDINGS: Brain: Stable, mild generalized parenchymal atrophy. Stable, mild ill-defined hypoattenuation within the cerebral white matter which is nonspecific, but consistent with chronic small vessel ischemic disease. There is no acute intracranial hemorrhage. No demarcated cortical infarct. No extra-axial fluid collection. No evidence of intracranial mass. No midline shift. Vascular: No hyperdense vessel atherosclerotic calcifications Skull: Normal. Negative for fracture or focal lesion. Sinuses/Orbits: Right-sided glaucoma valve. Visualized orbits show no acute finding. Minimal ethmoid sinus mucosal thickening. IMPRESSION: No CT evidence of acute intracranial abnormality. Stable mild generalized parenchymal atrophy and chronic small vessel ischemic disease. Minimal ethmoid sinus mucosal thickening. Electronically Signed   By: Jackey Loge DO   On: 03/24/2020 16:37   ECHOCARDIOGRAM COMPLETE  Result Date: 03/25/2020    ECHOCARDIOGRAM REPORT   Patient Name:   [name] Date of Exam: 03/25/2020 Medical Rec #:  [id number]        Height:       60.0 in Accession #:    [id number]       Weight:       120.0 lb Date of Birth:  [birth year]         BSA:          1.502 m Patient Age:    86 years         BP:           189/85 mmHg Patient Gender: F                HR:           60 bpm. Exam Location:  Inpatient Procedure: 2D Echo Indications:    chest pain  History:        Patient has no prior history of Echocardiogram examinations.                 Stroke; Risk Factors:Hypertension, Dyslipidemia and Former                 Smoker.  Sonographer:    Celene Skeen RDCS (AE) Referring Phys: [id number] CHING T TU IMPRESSIONS  1. Left ventricular ejection fraction, by estimation, is 60 to 65%. The left ventricle has normal function. The left ventricle has no regional wall motion abnormalities. Left ventricular diastolic parameters were normal.  2. Right ventricular systolic  function is normal. The right ventricular size is normal. There is normal pulmonary artery systolic pressure. The estimated right ventricular systolic pressure is 26.0 mmHg.  3. The mitral valve is normal in structure. Trivial mitral valve regurgitation. No evidence of mitral stenosis.  4. The aortic valve is normal in structure. Aortic valve regurgitation is not visualized. No aortic stenosis is present.  5. The inferior vena cava is normal in size with greater than 50% respiratory variability, suggesting right atrial pressure of 3 mmHg. FINDINGS  Left Ventricle: Left ventricular ejection fraction, by estimation, is 60 to 65%. The left ventricle has normal function. The left ventricle has no regional wall motion abnormalities. The left ventricular internal cavity size was normal in size. There is  no left ventricular hypertrophy. Left ventricular diastolic parameters were normal. Indeterminate filling pressures. Right Ventricle: The right ventricular size is normal. No increase in right ventricular wall thickness. Right ventricular systolic function is normal. There is normal pulmonary  artery systolic pressure. The tricuspid regurgitant velocity is 2.40 m/s, and  with an assumed right atrial pressure of 3 mmHg, the estimated right ventricular systolic pressure is 26.0 mmHg. Left Atrium: Left atrial size was normal in size. Right Atrium: Right atrial size was normal in size. Pericardium: There is no evidence of pericardial effusion. Mitral Valve: The mitral valve is normal in structure. Normal mobility of the mitral valve leaflets. Mild mitral annular calcification. Trivial mitral valve regurgitation. No evidence of mitral valve stenosis. Tricuspid Valve: The tricuspid valve is normal in structure. Tricuspid valve regurgitation is trivial. No evidence of tricuspid stenosis. Aortic Valve: The aortic valve is normal in structure. Aortic valve regurgitation is not visualized. No aortic stenosis is present. Pulmonic  Valve: The pulmonic valve was normal in structure. Pulmonic valve regurgitation is not visualized. No evidence of pulmonic stenosis. Aorta: The aortic root is normal in size and structure. Venous: The inferior vena cava is normal in size with greater than 50% respiratory variability, suggesting right atrial pressure of 3 mmHg. IAS/Shunts: The interatrial septum appears to be lipomatous. No atrial level shunt detected by color flow Doppler.  LEFT VENTRICLE PLAX 2D LVIDd:         4.50 cm  Diastology LVIDs:         2.60 cm  LV e' lateral:   6.74 cm/s LV PW:         0.70 cm  LV E/e' lateral: 11.9 LV IVS:        0.70 cm  LV e' medial:    6.42 cm/s LVOT diam:     1.90 cm  LV E/e' medial:  12.5 LV SV:         83 LV SV Index:   55 LVOT Area:     2.84 cm  RIGHT VENTRICLE RV S prime:     17.70 cm/s TAPSE (M-mode): 1.6 cm LEFT ATRIUM             Index       RIGHT ATRIUM           Index LA diam:        2.90 cm 1.93 cm/m  RA Area:     13.00 cm LA Vol (A2C):   36.3 ml 24.16 ml/m RA Volume:   29.30 ml  19.50 ml/m LA Vol (A4C):   46.0 ml 30.62 ml/m LA Biplane Vol: 43.2 ml 28.76 ml/m  AORTIC VALVE LVOT Vmax:   142.00 cm/s LVOT Vmean:  92.000 cm/s LVOT VTI:    0.293 m  AORTA Ao Root diam: 2.40 cm MITRAL VALVE               TRICUSPID VALVE MV Area (PHT): 3.12 cm    TR Peak grad:   23.0 mmHg MV Decel Time: 243 msec    TR Vmax:        240.00 cm/s MV E velocity: 80.10 cm/s MV A velocity: 66.80 cm/s  SHUNTS MV E/A ratio:  1.20        Systemic VTI:  0.29 m                            Systemic Diam: 1.90 cm Thurmon Fair MD Electronically signed by Thurmon Fair MD Signature Date/Time: 03/25/2020/11:03:28 AM    Final    CT Angio Chest/Abd/Pel for Dissection W and/or W/WO  Result Date: 03/24/2020 CLINICAL DATA:  Chest pain and back pain. EXAM: CT ANGIOGRAPHY CHEST, ABDOMEN AND  PELVIS TECHNIQUE: Non-contrast CT of the chest was initially obtained. Multidetector CT imaging through the chest, abdomen and pelvis was performed using  the standard protocol during bolus administration of intravenous contrast. Multiplanar reconstructed images and MIPs were obtained and reviewed to evaluate the vascular anatomy. CONTRAST:  39mL OMNIPAQUE IOHEXOL 350 MG/ML SOLN COMPARISON:  March 31, 2015 FINDINGS: CTA CHEST FINDINGS Cardiovascular: There is moderate severity calcification of the aortic arch. The ascending thoracic aorta measures approximately 3.2 cm in diameter, while the distal descending thoracic aorta measures approximately 2.8 cm x 2.6 cm. Satisfactory opacification of the pulmonary arteries to the segmental level. No evidence of pulmonary embolism. The main pulmonary artery measures 3.5 cm. Normal heart size. No pericardial effusion. Marked severity coronary artery calcification is noted Mediastinum/Nodes: No enlarged mediastinal, hilar, or axillary lymph nodes. Thyroid gland, trachea, and esophagus demonstrate no significant findings. Lungs/Pleura: Mild atelectasis and/or infiltrate is seen within the left lower lobe. There is no evidence of a pleural effusion or pneumothorax. Musculoskeletal: Multilevel degenerative changes seen throughout the thoracic spine. Review of the MIP images confirms the above findings. CTA ABDOMEN AND PELVIS FINDINGS VASCULAR Aorta: There is moderate to marked severity calcification of the abdominal aorta with and infrarenal abdominal aortic measurement of 2.4 cm x 2.3 cm. There is no evidence of dissection, vasculitis or significant stenosis. Celiac: Patent without evidence of aneurysm, dissection, vasculitis or significant stenosis. SMA: Patent without evidence of aneurysm, dissection, vasculitis or significant stenosis. Renals: Both renal arteries are patent without evidence of aneurysm, dissection, vasculitis, fibromuscular dysplasia or significant stenosis. IMA: Patent without evidence of aneurysm, dissection, vasculitis or significant stenosis. Inflow: Moderate to marked severity calcification without  evidence of aneurysm, dissection, vasculitis or significant stenosis. Veins: No obvious venous abnormality within the limitations of this arterial phase study. Review of the MIP images confirms the above findings. NON-VASCULAR Hepatobiliary: No focal liver abnormality is seen. Status post cholecystectomy. No biliary dilatation. Pancreas: Unremarkable. No pancreatic ductal dilatation or surrounding inflammatory changes. Spleen: Normal in size without focal abnormality. Adrenals/Urinary Tract: Adrenal glands are unremarkable. Kidneys are normal, without renal calculi, focal lesion, or hydronephrosis. Bladder is unremarkable. Stomach/Bowel: Stomach is within normal limits. The appendix is not clearly identified. No evidence of bowel wall thickening, distention, or inflammatory changes. Numerous noninflamed diverticula are seen throughout the large bowel. Lymphatic: No abnormal abdominal or pelvic lymph nodes are identified. Reproductive: Status post hysterectomy. No adnexal masses. Other: No abdominal wall hernia or abnormality. No abdominopelvic ascites. Musculoskeletal: Multilevel degenerative changes seen throughout the lumbar spine. Review of the MIP images confirms the above findings. IMPRESSION: 1. Mild left lower lobe atelectasis and/or infiltrate. 2. No evidence of aortic dissection or aneurysm. 3. Marked severity coronary artery calcification. 4. Enlarged main pulmonary artery which may be indicative of pulmonary arterial hypertension. 5. Colonic diverticulosis without evidence of diverticulitis. 6. Aortic atherosclerosis. Aortic Atherosclerosis (ICD10-I70.0). Electronically Signed   By: Aram Candela M.D.   On: 03/24/2020 16:49    Recent Labs: Lab Results  Component Value Date   WBC 8.1 03/24/2020   HGB 9.5 (L) 03/24/2020   PLT 237 03/24/2020   NA 140 03/26/2020   K 3.6 03/26/2020   CL 112 (H) 03/26/2020   CO2 20 (L) 03/26/2020   GLUCOSE 120 (H) 03/26/2020   BUN 18 03/26/2020   CREATININE  1.01 (H) 03/26/2020   BILITOT 0.4 03/24/2020   ALKPHOS 54 03/24/2020   AST 23 03/24/2020   ALT 17 03/24/2020   PROT 7.1 03/24/2020  ALBUMIN 3.8 03/24/2020   CALCIUM 8.5 (L) 03/26/2020   GFRAA 59 (L) 03/26/2020   QFTBGOLDPLUS NEGATIVE 06/14/2019    Speciality Comments: No specialty comments available.  Procedures:  No procedures performed Allergies: Penicillins   Assessment / Plan:     Visit Diagnoses: Rheumatoid arthritis involving multiple sites with positive rheumatoid factor (HCC)  High risk medication use  H/O total knee replacement, bilateral  DDD (degenerative disc disease), lumbar  Age-related osteoporosis without current pathological fracture  History of glaucoma/ patient is legally blind  History of diverticulosis  History of memory loss  History of atherosclerosis  history of asthmatic bronchitis  History of gastroesophageal reflux (GERD)  History of hypothyroidism  Essential hypertension  Orders: No orders of the defined types were placed in this encounter.  No orders of the defined types were placed in this encounter.   Face-to-face time spent with patient was *** minutes. Greater than 50% of time was spent in counseling and coordination of care.  Follow-Up Instructions: No follow-ups on file.   Gearldine Bienenstock, PA-C  Note - This record has been created using Dragon software.  Chart creation errors have been sought, but may not always  have been located. Such creation errors do not reflect on  the standard of medical care.'

## 2020-04-06 ENCOUNTER — Telehealth: Payer: Self-pay

## 2020-04-06 ENCOUNTER — Encounter: Payer: Self-pay | Admitting: Nurse Practitioner

## 2020-04-06 ENCOUNTER — Ambulatory Visit (INDEPENDENT_AMBULATORY_CARE_PROVIDER_SITE_OTHER): Payer: Medicare Other | Admitting: Nurse Practitioner

## 2020-04-06 ENCOUNTER — Other Ambulatory Visit: Payer: Self-pay

## 2020-04-06 VITALS — BP 116/72 | HR 62 | Temp 97.7°F | Ht 61.24 in | Wt 99.0 lb

## 2020-04-06 DIAGNOSIS — I10 Essential (primary) hypertension: Secondary | ICD-10-CM

## 2020-04-06 DIAGNOSIS — Z87898 Personal history of other specified conditions: Secondary | ICD-10-CM

## 2020-04-06 DIAGNOSIS — G4700 Insomnia, unspecified: Secondary | ICD-10-CM

## 2020-04-06 DIAGNOSIS — Z23 Encounter for immunization: Secondary | ICD-10-CM

## 2020-04-06 DIAGNOSIS — E039 Hypothyroidism, unspecified: Secondary | ICD-10-CM | POA: Diagnosis not present

## 2020-04-06 DIAGNOSIS — Z8669 Personal history of other diseases of the nervous system and sense organs: Secondary | ICD-10-CM

## 2020-04-06 DIAGNOSIS — R5383 Other fatigue: Secondary | ICD-10-CM

## 2020-04-06 MED ORDER — LEVOTHYROXINE SODIUM 88 MCG PO TABS: 88.0000 ug | ORAL_TABLET | Freq: Every day | ORAL | 0 refills | Status: DC

## 2020-04-06 NOTE — Progress Notes (Signed)
This visit occurred during the SARS-CoV-2 public health emergency.  Safety protocols were in place, including screening questions prior to the visit, additional usage of staff PPE, and extensive cleaning of exam room while observing appropriate contact time as indicated for disinfecting solutions.  Subjective:     Patient ID: Jordan [name] , female    DOB: [birth year] , 86 y.o.   MRN: [id number]   Chief Complaint  Patient presents with  . Hospitalization Follow-up  . Insomnia    HPI  Here for hospital follow up on 8/31-9/2 after having chest pain and general malaise.  She had complained of acute midsternal sharp chest pain that was not radiating in the middle of the night. This had been a constant pain and was treated with Nitropaste while in the ER with relief.  She had come to the office and was not feeling well but did not mention chest pain however her blood pressure was decreased. Her caregiver was advised to take her to the ER for further evaluation.   She was hypotensive down to 70's/50's.  She was found to have AKI with a creatinine of 1.55 compared to 0.82 previously. Her lactic acid was elevated which improved after receiving IV fluids to normal. Her EKG revealed borderline ST depression in theanteriolateral lead according to the notes. There was concern she had a UTI and was treated empirically with Levaquin. Her blood pressure improved however her lisinopril was held until her follow up today to evaluate if continues to need, the amlodipine was also discontinued. There was also a recommendation to follow up with Cardiology.  She is to have [facility] for PT/OT at home.  She is here today with her caregiver and her daughter is on the phone Jordan [name]).  Her thyroid levels have continued to fluctuate.      Past Medical History:  Diagnosis Date  . Allergy   . Anxiety    per pt's daughter  . Arthritis   . Asthma    pt reported  . Depression   . GERD (gastroesophageal reflux disease)   .  Hearing deficit L  . High cholesterol   . Hypertension   . Osteoporosis   . Rheumatoid arthritis (HCC)   . Stroke [facility])      History reviewed. No pertinent family history.   Current Outpatient Medications:  .  albuterol (VENTOLIN HFA) 108 (90 Base) MCG/ACT inhaler, Inhale 1 puff into the lungs every 6 (six) hours as needed for wheezing or shortness of breath., Disp: 6.7 g, Rfl: 5 .  ALLERGY RELIEF 10 MG tablet, TAKE 1 TABLET BY MOUTH EVERY DAY (Patient taking differently: Take 10 mg by mouth daily. ), Disp: 90 tablet, Rfl: 0 .  amLODipine (NORVASC) 5 MG tablet, Take 1 tablet (5 mg total) by mouth daily., Disp: 90 tablet, Rfl: 1 .  aspirin EC 81 MG tablet, Take 81 mg by mouth daily. Swallow whole., Disp: , Rfl:  .  brimonidine-timolol (COMBIGAN) 0.2-0.5 % ophthalmic solution, Place 1 drop into both eyes every 12 (twelve) hours., Disp: , Rfl:  .  feeding supplement, ENSURE ENLIVE, (ENSURE ENLIVE) LIQD, Take 237 mLs by mouth daily in the afternoon., Disp: , Rfl:  .  ferrous sulfate 325 (65 FE) MG tablet, Take 325 mg by mouth daily with breakfast., Disp: , Rfl:  .  fluticasone (FLONASE) 50 MCG/ACT nasal spray, INSTILL 2 SPRAYS IN EACH NOSTRIL EVERY DAY (Patient taking differently: Place 2 sprays into both nostrils daily as needed for allergies. ), Disp: 16  g, Rfl: 1 .  folic acid (FOLVITE) 1 MG tablet, TAKE 1 TABLET(1 MG) BY MOUTH DAILY (Patient taking differently: Take 1 mg by mouth daily. ), Disp: 90 tablet, Rfl: 0 .  latanoprost (XALATAN) 0.005 % ophthalmic solution, Place 1 drop into both eyes at bedtime. , Disp: , Rfl:  .  ENBREL SURECLICK 50 MG/ML injection, Inject 0.98 mLs (50 mg total) into the skin once a week., Disp: 4 mL, Rfl: 0 .  levothyroxine (SYNTHROID) 88 MCG tablet, Take 1 tablet (88 mcg total) by mouth daily at 6 (six) AM. Give on an empty stomach, at least 30 minutes before eating., Disp: 30 tablet, Rfl: 0 .  loratadine (CLARITIN) 10 MG tablet, Take 1 tablet (10 mg total) by  mouth daily., Disp: 90 tablet, Rfl: 0 .  mirtazapine (REMERON) 30 MG tablet, Take 1 tablet (30 mg total) by mouth at bedtime., Disp: 90 tablet, Rfl: 1 .  NAMZARIC 28-10 MG CP24, Take 1 capsule by mouth daily., Disp: 90 capsule, Rfl: 1 .  omeprazole (PRILOSEC) 20 MG capsule, Take 1 capsule (20 mg total) by mouth daily. TAKE 1 CAPSULE BY MOUTH EVERY DAY, Disp: 90 capsule, Rfl: 1 .  simvastatin (ZOCOR) 40 MG tablet, Take 1 tablet (40 mg total) by mouth at bedtime., Disp: 90 tablet, Rfl: 1   Allergies  Allergen Reactions  . Penicillins Hives     Review of Systems  Constitutional: Negative.   Respiratory: Negative.   Cardiovascular: Negative.  Negative for chest pain, palpitations and leg swelling.  Neurological: Negative for dizziness and headaches.  Psychiatric/Behavioral: Negative.      Today's Vitals   04/06/20 1645  BP: 116/72  Pulse: 62  Temp: 97.7 F (36.5 C)  TempSrc: Oral  Weight: 99 lb (44.9 kg)  Height: 5' 1.24" (1.555 m)  PainSc: 0-No pain   Body mass index is 18.56 kg/m.   Objective:  Physical Exam Constitutional:      General: She is not in acute distress.    Appearance: Normal appearance.  Cardiovascular:     Rate and Rhythm: Normal rate and regular rhythm.     Pulses: Normal pulses.     Heart sounds: Normal heart sounds. No murmur heard.   Pulmonary:     Effort: Pulmonary effort is normal. No respiratory distress.     Breath sounds: Normal breath sounds. No wheezing.  Skin:    General: Skin is warm and dry.     Coloration: Skin is not jaundiced.  Neurological:     General: No focal deficit present.     Mental Status: She is alert and oriented to person, place, and time.     Cranial Nerves: No cranial nerve deficit.  Psychiatric:        Mood and Affect: Mood normal.        Behavior: Behavior normal.        Thought Content: Thought content normal.        Judgment: Judgment normal.         Assessment And Plan:     1. Acquired  hypothyroidism  This has been up and down, I am not sure she is taking the correct dose when there are changes being made. She has also not returned for follow up labs after adjustments.  She does receive her medications with mail order. We need to develop a plan to make easier for her to follow if medication changes need to be made.    2. Fatigue, unspecified type  Will  have PT/OT to visit to help with strength and deconditioning - Ambulatory referral to Home Health  3. Essential hypertension  Her blood pressure was low so she is currently off her blood pressure medications - Ambulatory referral to Cardiology  4. History of chest pain  TCM Performed. A member of the clinical team spoke with the patient upon dischare. Discharge summary was reviewed in full detail during the visit. Meds reconciled and compared to discharge meds. Medication list is updated and reviewed with the patient.  Greater than 50% face to face time was spent in counseling an coordination of care.  All questions were answered to the satisfaction of the patient.    She will be referred to cardiology for further evaluation - Ambulatory referral to Cardiology  5. Insomnia, unspecified type  I will check to see if she can qualify for [facility] as she is legally blind  6. Need for influenza vaccination  Influenza vaccine administered  Encouraged to take Tylenol as needed for fever or muscle aches. - Flu Vaccine QUAD High Dose(Fluad)  7. History of glaucoma/ patient is legally blind  She is having difficulty with sleeping   Patient was given opportunity to ask questions. Patient verbalized understanding of the plan and was able to repeat key elements of the plan. All questions were answered to their satisfaction.   Jeanell Sparrow, FNP, have reviewed all documentation for this visit. The documentation on 04/21/20 for the exam, diagnosis, procedures, and orders are all accurate and complete.   THE PATIENT IS  ENCOURAGED TO PRACTICE SOCIAL DISTANCING DUE TO THE COVID-19 PANDEMIC.

## 2020-04-08 ENCOUNTER — Telehealth: Payer: Self-pay

## 2020-04-14 ENCOUNTER — Other Ambulatory Visit: Payer: Self-pay | Admitting: Nurse Practitioner

## 2020-04-14 ENCOUNTER — Other Ambulatory Visit: Payer: Self-pay | Admitting: Rheumatology

## 2020-04-14 DIAGNOSIS — I10 Essential (primary) hypertension: Secondary | ICD-10-CM

## 2020-04-14 MED ORDER — MIRTAZAPINE 30 MG PO TABS
30.0000 mg | ORAL_TABLET | Freq: Every day | ORAL | 1 refills | Status: DC
Start: 2020-04-14 — End: 2020-05-14

## 2020-04-14 MED ORDER — NAMZARIC 28-10 MG PO CP24: 1.0000 | ORAL_CAPSULE | Freq: Every day | ORAL | 1 refills | Status: DC

## 2020-04-14 MED ORDER — SIMVASTATIN 40 MG PO TABS
40.0000 mg | ORAL_TABLET | Freq: Every day | ORAL | 1 refills | Status: DC
Start: 2020-04-14 — End: 2020-08-31

## 2020-04-14 MED ORDER — OMEPRAZOLE 20 MG PO CPDR: 20.0000 mg | DELAYED_RELEASE_CAPSULE | Freq: Every day | ORAL | 1 refills | Status: DC

## 2020-04-14 NOTE — Telephone Encounter (Signed)
Last Visit:11/13/2019 Next Visit:05/07/2020 Labs:03/24/2020 Hgb 11.9, MCV 73.7, MCH 23.3, RDW 17.7, Cholride 112, CO2 19, Creatinine 1.47, GFR 38, Calcium 8.7 TB Gold:06/14/2019 Neg  Current Dose per office note4/21/2021:Enbrel Sureclick 50 mg every 7 days  KR:[id number] arthritis involving multiple sites with positive rheumatoid factor  Okay to refill Enbrel?

## 2020-04-14 NOTE — Telephone Encounter (Signed)
I need help.  I can't tell if you restarted her lisinopril back at her last visit.  It was stopped when she was in the hospital for hypotension. thanks

## 2020-04-14 NOTE — Telephone Encounter (Signed)
No I am keeping her off the lisinopril

## 2020-04-15 ENCOUNTER — Other Ambulatory Visit: Payer: Self-pay

## 2020-04-15 MED ORDER — LORATADINE 10 MG PO TABS
10.0000 mg | ORAL_TABLET | Freq: Every day | ORAL | 0 refills | Status: DC
Start: 2020-04-15 — End: 2020-07-06

## 2020-04-17 ENCOUNTER — Ambulatory Visit: Payer: Medicare Other | Admitting: Rheumatology

## 2020-04-27 NOTE — Progress Notes (Signed)
Office Visit Note  Patient: Jordan [name]             Date of Birth: 1934/02/26           MRN: [id number]             PCP: Arnette Felts, FNP Referring: Arnette Felts, FNP Visit Date: 05/07/2020 Occupation: @GUAROCC @  Subjective:  Right eye pain   History of Present Illness: Jordan [name] is a 86 y.o. female with history of seropositive rheumatoid arthritis, osteoarthritis and osteoporosis.  She states she has been doing extremely well on Enbrel without any joint pain or joint swelling.  She denies any joint stiffness.  She states she did not put her eyedrops this morning and has been experiencing severe eye pain.  Her daughter 97 was on the phone who also confirmed that if she does not put the eyedrops she has severe eye pain.  Patient states that she forgot to put the eyedrops this morning.  She states she woke up this morning and has been feeling dizziness and also eye pain.  She will put eyedrops when she gets home.  Have also advised to schedule an appointment with ophthalmologist tomorrow.  She denies any muscle weakness or tenderness.  She has generalized deconditioning and has difficulty climbing stairs and getting up from the chair.  Activities of Daily Living:  Patient reports morning stiffness for  0 minutes.   Patient Denies nocturnal pain.  Difficulty dressing/grooming: Reports Difficulty climbing stairs: Reports Difficulty getting out of chair: Reports Difficulty using hands for taps, buttons, cutlery, and/or writing: Denies  Review of Systems  Constitutional: Positive for fatigue.  HENT: Negative for mouth sores, mouth dryness and nose dryness.   Eyes: Positive for pain. Negative for itching and dryness.  Respiratory: Negative for shortness of breath and difficulty breathing.   Cardiovascular: Negative for chest pain and palpitations.  Gastrointestinal: Positive for constipation and diarrhea. Negative for blood in stool.  Endocrine: Negative for increased  urination.  Genitourinary: Negative for difficulty urinating and painful urination.  Musculoskeletal: Negative for arthralgias, joint pain, joint swelling, myalgias, morning stiffness, muscle tenderness and myalgias.  Skin: Negative for color change, rash and redness.  Allergic/Immunologic: Negative for susceptible to infections.  Neurological: Positive for dizziness, headaches and memory loss. Negative for numbness and weakness.  Hematological: Positive for bruising/bleeding tendency.  Psychiatric/Behavioral: Positive for confusion and sleep disturbance.    PMFS History:  Patient Active Problem List   Diagnosis Date Noted  . Chest pain   . Hypotension 03/24/2020  . Alzheimer's disease (HCC) 08/13/2019  . Essential hypertension 08/13/2018  . Acquired hypothyroidism 08/13/2018  . Rash and nonspecific skin eruption 08/13/2018  . Memory loss 04/12/2017  . Depression 04/12/2017  . Rheumatoid arthritis involving multiple sites with positive rheumatoid factor (HCC) 08/17/2016  . High risk medication use 08/17/2016  . H/O total knee replacement, bilateral 08/17/2016  . Spondylosis of lumbar region without myelopathy or radiculopathy 08/17/2016  . History of glaucoma/ patient is legally blind 08/17/2016  . History of gastroesophageal reflux (GERD) 08/17/2016  . History of diverticulosis 08/17/2016  . History of hypothyroidism 08/17/2016  . history of asthmatic bronchitis 08/17/2016  . History of memory loss 08/17/2016  . History of atherosclerosis 08/17/2016    Past Medical History:  Diagnosis Date  . Allergy   . Anxiety    per pt's daughter  . Arthritis   . Asthma    pt reported  . Depression   . GERD (gastroesophageal reflux disease)   .  Hearing deficit L  . High cholesterol   . Hypertension   . Osteoporosis   . Rheumatoid arthritis (HCC)   . Stroke [facility])     History reviewed. No pertinent family history. Past Surgical History:  Procedure Laterality Date  . ABDOMINAL  HYSTERECTOMY    . KNEE ARTHROPLASTY    . REPLACEMENT TOTAL KNEE BILATERAL Bilateral   . WRIST SURGERY Left    Social History   Social History Narrative   Lives at home with grandson.   Right-handed.   2-3 cups coffee per day.   Immunization History  Administered Date(s) Administered  . Fluad Quad(high Dose 65+) 04/06/2020  . Moderna SARS-COVID-2 Vaccination 09/26/2019, 10/24/2019     Objective: Vital Signs: BP 94/60 (BP Location: Left Arm, Patient Position: Sitting, Cuff Size: Normal)   Pulse 61   Resp 13   Ht 5\' 1"  (1.549 m)   Wt 95 lb 3.2 oz (43.2 kg)   BMI 17.99 kg/m    Physical Exam Vitals and nursing note reviewed.  Constitutional:      Appearance: She is well-developed.  HENT:     Head: Normocephalic and atraumatic.  Eyes:     Conjunctiva/sclera: Conjunctivae normal.  Cardiovascular:     Rate and Rhythm: Normal rate and regular rhythm.     Heart sounds: Normal heart sounds.  Pulmonary:     Effort: Pulmonary effort is normal.     Breath sounds: Normal breath sounds.  Abdominal:     General: Bowel sounds are normal.     Palpations: Abdomen is soft.  Musculoskeletal:     Cervical back: Normal range of motion.  Lymphadenopathy:     Cervical: No cervical adenopathy.  Skin:    General: Skin is warm and dry.     Capillary Refill: Capillary refill takes less than 2 seconds.  Neurological:     Mental Status: She is alert and oriented to person, place, and time.  Psychiatric:        Behavior: Behavior normal.      Musculoskeletal Exam: C-spine was in good range of motion.  Thoracic and lumbar spine were difficult to assess.  Shoulder joints, elbow joints with good range of motion.  She had bilateral MCP thickening.  PIP and DIP thickening was noted.  Hip joints were difficult to assess.  There was no discomfort range of motion of knee joints ankle joints MTPs and PIPs with no synovitis. CDAI Exam: CDAI Score: 0.2  Patient Global: 1 mm; Provider Global: 1  mm Swollen: 0 ; Tender: 0  Joint Exam 05/07/2020   No joint exam has been documented for this visit   There is currently no information documented on the homunculus. Go to the Rheumatology activity and complete the homunculus joint exam.  Investigation: No additional findings.  Imaging: No results found.  Recent Labs: Lab Results  Component Value Date   WBC 8.1 03/24/2020   HGB 9.5 (L) 03/24/2020   PLT 237 03/24/2020   NA 140 03/26/2020   K 3.6 03/26/2020   CL 112 (H) 03/26/2020   CO2 20 (L) 03/26/2020   GLUCOSE 120 (H) 03/26/2020   BUN 18 03/26/2020   CREATININE 1.01 (H) 03/26/2020   BILITOT 0.4 03/24/2020   ALKPHOS 54 03/24/2020   AST 23 03/24/2020   ALT 17 03/24/2020   PROT 7.1 03/24/2020   ALBUMIN 3.8 03/24/2020   CALCIUM 8.5 (L) 03/26/2020   GFRAA 59 (L) 03/26/2020   QFTBGOLDPLUS NEGATIVE 06/14/2019    Speciality Comments: No  specialty comments available.  Procedures:  No procedures performed Allergies: Penicillins   Assessment / Plan:     Visit Diagnoses: Rheumatoid arthritis involving multiple sites with positive rheumatoid factor (HCC)-her arthritis is well controlled.  She had no synovitis on examination today.  She is taking Enbrel shots which she is tolerating well.  High risk medication use - Enbrel Sureclick 50 mg every 7 days.  Her last labs were on March 24, 2020 which were stable.  We will continue to monitor labs every 3 months.  TB gold will be done with her November labs.  Eye pain, right -patient states that she has been having right eye pain since morning.  She also has some tenderness in the right frontal and temporal region.  I will check sed rate today.  I spoke with her daughter Jordan [name] who said that if she does not put her eyedrops then she has pain in her right eye.  Have advised patient to use the eyedrops when she gets home.  Have also advised her to schedule an appointment with ophthalmologist tomorrow for evaluation.  Plan: Sedimentation  rate  H/O total knee replacement, bilateral-doing well.  DDD (degenerative disc disease), lumbar-she has off-and-on discomfort in her lower back.  Age-related osteoporosis without current pathological fracture - Scan on 06/19/2017 showed T-score of -3 at left femur neck and no baseline for comparison of BMD change. Future order for DEXA in place.  I have advised patient to get DEXA scan.  History of glaucoma/ patient is legally blind-she has not seen an ophthalmologist in a long time per patient.  History of memory loss  History of diverticulosis  history of asthmatic bronchitis  History of atherosclerosis  History of gastroesophageal reflux (GERD)  History of hypothyroidism  Essential hypertension-much better controlled now.  Educated about COVID-19 virus infection-she is fully vaccinated against COVID-19.  Have advised her to get booster when it is available to her.  Use of mask, social distancing and hand hygiene was discussed.  I also discussed monoclonal antibody infusion in case she develops COVID-19 infection.  Orders: Orders Placed This Encounter  Procedures  . Sedimentation rate  . QuantiFERON-TB Gold Plus   No orders of the defined types were placed in this encounter.    Follow-Up Instructions: Return in about 3 months (around 08/07/2020) for Rheumatoid arthritis, Osteoarthritis, Osteoporosis.   Pollyann Savoy, MD  Note - This record has been created using Animal nutritionist.  Chart creation errors have been sought, but may not always  have been located. Such creation errors do not reflect on  the standard of medical care.

## 2020-05-07 ENCOUNTER — Encounter: Payer: Self-pay | Admitting: Rheumatology

## 2020-05-07 ENCOUNTER — Other Ambulatory Visit: Payer: Self-pay

## 2020-05-07 ENCOUNTER — Ambulatory Visit (INDEPENDENT_AMBULATORY_CARE_PROVIDER_SITE_OTHER): Payer: Medicare Other | Admitting: Rheumatology

## 2020-05-07 VITALS — BP 94/60 | HR 61 | Resp 13 | Ht 61.0 in | Wt 95.2 lb

## 2020-05-07 DIAGNOSIS — Z8679 Personal history of other diseases of the circulatory system: Secondary | ICD-10-CM

## 2020-05-07 DIAGNOSIS — I10 Essential (primary) hypertension: Secondary | ICD-10-CM

## 2020-05-07 DIAGNOSIS — Z96653 Presence of artificial knee joint, bilateral: Secondary | ICD-10-CM

## 2020-05-07 DIAGNOSIS — M5136 Other intervertebral disc degeneration, lumbar region: Secondary | ICD-10-CM

## 2020-05-07 DIAGNOSIS — Z79899 Other long term (current) drug therapy: Secondary | ICD-10-CM | POA: Diagnosis not present

## 2020-05-07 DIAGNOSIS — M0579 Rheumatoid arthritis with rheumatoid factor of multiple sites without organ or systems involvement: Secondary | ICD-10-CM | POA: Diagnosis not present

## 2020-05-07 DIAGNOSIS — H5711 Ocular pain, right eye: Secondary | ICD-10-CM

## 2020-05-07 DIAGNOSIS — Z8719 Personal history of other diseases of the digestive system: Secondary | ICD-10-CM

## 2020-05-07 DIAGNOSIS — Z7189 Other specified counseling: Secondary | ICD-10-CM

## 2020-05-07 DIAGNOSIS — Z8709 Personal history of other diseases of the respiratory system: Secondary | ICD-10-CM

## 2020-05-07 DIAGNOSIS — M810 Age-related osteoporosis without current pathological fracture: Secondary | ICD-10-CM

## 2020-05-07 DIAGNOSIS — Z8639 Personal history of other endocrine, nutritional and metabolic disease: Secondary | ICD-10-CM

## 2020-05-07 DIAGNOSIS — Z87898 Personal history of other specified conditions: Secondary | ICD-10-CM

## 2020-05-07 DIAGNOSIS — Z8669 Personal history of other diseases of the nervous system and sense organs: Secondary | ICD-10-CM

## 2020-05-07 NOTE — Patient Instructions (Addendum)
Standing Labs We placed an order today for your standing lab work.   Please have your standing labs drawn in November and every 3 months TB Gold is due in November.  If possible, please have your labs drawn 2 weeks prior to your appointment so that the provider can discuss your results at your appointment.  We have open lab daily Monday through Thursday from 8:30-12:30 PM and 1:30-4:30 PM and Friday from 8:30-12:30 PM and 1:30-4:00 PM at the office of Dr. Pollyann Savoy, [facility].   Please be advised, patients with office appointments requiring lab work will take precedents over walk-in lab work.  If possible, please come for your lab work on Monday and Friday afternoons, as you may experience shorter wait times. The office is located at [address], [address], [place], [place] [address] No appointment is necessary.   Labs are drawn by [facility]. Please bring your co-pay at the time of your lab draw.  You may receive a bill from [facility] for your lab work.  If you wish to have your labs drawn at another location, please call the office 24 hours in advance to send orders.  If you have any questions regarding directions or hours of operation,  please call [phone].   As a reminder, please drink plenty of water prior to coming for your lab work. Thanks!   COVID-19 vaccine recommendations:   COVID-19 vaccine is recommended for everyone (unless you are allergic to a vaccine component), even if you are on a medication that suppresses your immune system.   If you are on Methotrexate, Cellcept (mycophenolate), Rinvoq, Harriette Ohara, and Olumiant- hold the medication for 1 week after each vaccine. Hold Methotrexate for 2 weeks after the single dose COVID-19 vaccine.   If you are on Orencia subcutaneous injection - hold medication one week prior to and one week after the first COVID-19 vaccine dose (only).   If you are on Orencia IV infusions- time vaccination administration so that  the first COVID-19 vaccination will occur four weeks after the infusion and postpone the subsequent infusion by one week.   If you are on Cyclophosphamide or Rituxan infusions please contact your doctor prior to receiving the COVID-19 vaccine.   Do not take Tylenol or any anti-inflammatory medications (NSAIDs) 24 hours prior to the COVID-19 vaccination.   There is no direct evidence about the efficacy of the COVID-19 vaccine in individuals who are on medications that suppress the immune system.   Even if you are fully vaccinated, and you are on any medications that suppress your immune system, please continue to wear a mask, maintain at least six feet social distance and practice hand hygiene.   If you develop a COVID-19 infection, please contact your PCP or our office to determine if you need antibody infusion.  The booster vaccine is now available for immunocompromised patients. It is advised that if you had Pfizer vaccine you should get [employer] booster.  If you had a Moderna vaccine then you should get a Moderna booster. Johnson and Laural Benes does not have a booster vaccine at this time.  Please see the following web sites for updated information.   [email or web address]-[email or web address]

## 2020-05-08 ENCOUNTER — Telehealth: Payer: Self-pay | Admitting: *Deleted

## 2020-05-08 LAB — SEDIMENTATION RATE: Sed Rate: 31 mm/h — ABNORMAL HIGH (ref 0–30)

## 2020-05-08 NOTE — Progress Notes (Signed)
Her sed rate is borderline elevated, not significant.  Please call to check if her eye pain resolved.

## 2020-05-08 NOTE — Telephone Encounter (Signed)
Spoke with patient's daughter and she states she states that Jola is doing well. She states Deardra is no longer having pain.

## 2020-05-08 NOTE — Telephone Encounter (Signed)
-----   Message from Henriette Combs, LPN sent at 14/43/1540  3:11 PM EDT ----- Call to check on patient.

## 2020-05-11 ENCOUNTER — Other Ambulatory Visit: Payer: Self-pay | Admitting: Nurse Practitioner

## 2020-05-11 ENCOUNTER — Other Ambulatory Visit: Payer: Self-pay | Admitting: Rheumatology

## 2020-05-11 DIAGNOSIS — I10 Essential (primary) hypertension: Secondary | ICD-10-CM

## 2020-05-11 NOTE — Telephone Encounter (Signed)
Due for lab work-CBC, CMP, and TB gold in November. Please place future orders.

## 2020-05-11 NOTE — Telephone Encounter (Signed)
Last Visit: 05/07/2020 Next Visit: 08/06/2020 Labs: 03/24/2020 Hgb 11.9, MCV 73.7, MCH 23.3, RDW 17.7, Glucose 108, BUN 28, Creatinine 1.55 GFR 35 TB Gold: 06/13/2020  Current Dose per office note 05/07/2020: Enbrel Sureclick 50 mg every 7 days  DX: Rheumatoid arthritis involving multiple sites with positive rheumatoid factor   Okay to refill Enbrel?

## 2020-05-11 NOTE — Telephone Encounter (Signed)
Future Orders are in place.

## 2020-05-14 ENCOUNTER — Ambulatory Visit (INDEPENDENT_AMBULATORY_CARE_PROVIDER_SITE_OTHER): Payer: Medicare Other | Admitting: Nurse Practitioner

## 2020-05-14 ENCOUNTER — Encounter: Payer: Self-pay | Admitting: Nurse Practitioner

## 2020-05-14 ENCOUNTER — Other Ambulatory Visit: Payer: Self-pay

## 2020-05-14 VITALS — BP 114/68 | HR 78 | Temp 97.8°F | Ht 61.0 in | Wt 94.6 lb

## 2020-05-14 DIAGNOSIS — E039 Hypothyroidism, unspecified: Secondary | ICD-10-CM

## 2020-05-14 DIAGNOSIS — G4700 Insomnia, unspecified: Secondary | ICD-10-CM | POA: Diagnosis not present

## 2020-05-14 MED ORDER — QUETIAPINE FUMARATE 25 MG PO TABS: 25.0000 mg | ORAL_TABLET | Freq: Every day | ORAL | 2 refills | Status: DC

## 2020-05-14 NOTE — Patient Instructions (Addendum)
Quetiapine tablets What is this medicine? QUETIAPINE (kwe TYE a peen) is an antipsychotic. It is used to treat schizophrenia and bipolar disorder, also known as manic-depression. This medicine may be used for other purposes; ask your health care provider or pharmacist if you have questions. COMMON BRAND NAME(S): Seroquel What should I tell my health care provider before I take this medicine? They need to know if you have any of these conditions:  blockage in your bowel  cataracts  constipation  dehydration  diabetes  difficulty swallowing  glaucoma  heart disease  history of breast cancer  kidney disease  liver disease  low blood counts, like low white cell, platelet, or red cell counts  low blood pressure or dizziness when standing up  Parkinson's disease  previous heart attack  prostate disease  seizures  stomach or intestine problems  suicidal thoughts, plans or attempt; a previous suicide attempt by you or a family member  thyroid disease  trouble passing urine  an unusual or allergic reaction to quetiapine, other medicines, foods, dyes, or preservatives  pregnant or trying to get pregnant  breast-feeding How should I use this medicine? Take this medicine by mouth. Swallow it with a drink of water. Follow the directions on the prescription label. If it upsets your stomach you can take it with food. Take your medicine at regular intervals. Do not take it more often than directed. Do not stop taking except on the advice of your doctor or health care professional. A special MedGuide will be given to you by the pharmacist with each prescription and refill. Be sure to read this information carefully each time. Talk to your pediatrician regarding the use of this medicine in children. While this drug may be prescribed for children as young as 10 years for selected conditions, precautions do apply. Patients over age 84 years may have a stronger reaction to this  medicine and need smaller doses. Overdosage: If you think you have taken too much of this medicine contact a poison control center or emergency room at once. NOTE: This medicine is only for you. Do not share this medicine with others. What if I miss a dose? If you miss a dose, take it as soon as you can. If it is almost time for your next dose, take only that dose. Do not take double or extra doses. What may interact with this medicine? Do not take this medicine with any of the following medications:  cisapride  dronedarone  fluconazole  metoclopramide  pimozide  posaconazole  thioridazine This medicine may also interact with the following medications:  alcohol  antihistamines for allergy cough and cold  antiviral medicines for HIV or AIDS  atropine  certain medicines for bladder problems like oxybutynin, tolterodine  certain medicines for blood pressure  certain medicines for depression, anxiety, or psychotic disturbances  certain medicines for diabetes  certain medicines for stomach problems like dicyclomine, hyoscyamine  certain medicines for travel sickness like scopolamine  certain medicines for Parkinson's disease  certain medicines for seizures like carbamazepine, phenobarbital, phenytoin  cimetidine  erythromycin  ipratropium  other medicines that prolong the QT interval (cause an abnormal heart rhythm) like dofetilide  rifampin  steroid medicines like prednisone or cortisone This list may not describe all possible interactions. Give your health care provider a list of all the medicines, herbs, non-prescription drugs, or dietary supplements you use. Also tell them if you smoke, drink alcohol, or use illegal drugs. Some items may interact with your medicine. What   should I watch for while using this medicine? Visit your health care professional for regular checks on your progress. Tell your health care professional if symptoms do not start to get  better or if they get worse. Do not stop taking except on your health care professional's advice. You Rollinson develop a severe reaction. Your health care professional will tell you how much medicine to take. You Crumble need to have an eye exam before and during use of this medicine. This medicine Marteney increase blood sugar. Ask your health care provider if changes in diet or medicines are needed if you have diabetes. Patients and their families should watch out for new or worsening depression or thoughts of suicide. Also watch out for sudden or severe changes in feelings such as feeling anxious, agitated, panicky, irritable, hostile, aggressive, impulsive, severely restless, overly excited and hyperactive, or not being able to sleep. If this happens, especially at the beginning of antidepressant treatment or after a change in dose, call your health care professional. You Erxleben get dizzy or drowsy. Do not drive, use machinery, or do anything that needs mental alertness until you know how this medicine affects you. Do not stand or sit up quickly, especially if you are an older patient. This reduces the risk of dizzy or fainting spells. Alcohol Katayama interfere with the effect of this medicine. Avoid alcoholic drinks. This drug can cause problems with controlling your body temperature. It can lower the response of your body to cold temperatures. If possible, stay indoors during cold weather. If you must go outdoors, wear warm clothes. It can also lower the response of your body to heat. Do not overheat. Do not over-exercise. Stay out of the sun when possible. If you must be in the sun, wear cool clothing. Drink plenty of water. If you have trouble controlling your body temperature, call your health care provider right away. What side effects Guidotti I notice from receiving this medicine? Side effects that you should report to your doctor or health care professional as soon as possible:  allergic reactions like skin rash,  itching or hives, swelling of the face, lips, or tongue  breathing problems  changes in vision  confusion  elevated mood, decreased need for sleep, racing thoughts, impulsive behavior  eye pain  fast, irregular heartbeat  fever or chills, sore throat  inability to keep still  males: prolonged or painful erection  problems with balance, talking, walking  redness, blistering, peeling, or loosening of the skin, including inside the mouth  seizures  signs and symptoms of high blood sugar such as being more thirsty or hungry or having to urinate more than normal. You Bossard also feel very tired or have blurry vision  signs and symptoms of hypothyroidism like fatigue; increased sensitivity to cold; weight gain; hoarseness; thinning hair  signs and symptoms of low blood pressure like dizziness; feeling faint or lightheaded; falls; unusually weak or tired  signs and symptoms of neuroleptic malignant syndrome like confusion; fast, irregular heartbeat; high fever; increased sweating; stiff muscles  sudden numbness or weakness of the face, arm, or leg  suicidal thoughts or other mood changes  trouble swallowing  uncontrollable movements of the arms, face, head, mouth, neck, or upper body Side effects that usually do not require medical attention (report to your doctor or health care professional if they continue or are bothersome):  change in sex drive or performance  constipation  drowsiness  dry mouth  upset stomach  weight gain This list Goelz   not describe all possible side effects. Call your doctor for medical advice about side effects. You may report side effects to FDA at 1-800-FDA-1088. Where should I keep my medicine? Keep out of the reach of children. Store at room temperature between 15 and 30 degrees C (59 and 86 degrees F). Throw away any unused medicine after the expiration date. NOTE: This sheet is a summary. It may not cover all possible information. If you  have questions about this medicine, talk to your doctor, pharmacist, or health care provider.  2020 Elsevier/Gold Standard (2019-05-08 11:59:11)    Stop taking the mirtazapine (Remeron), you will start taking the quetiapine (Seroquel)   Take your levothyroxine by itself with water wait 2 hours before eating or taking any other medications.

## 2020-05-14 NOTE — Progress Notes (Signed)
Tomasa Hose as a scribe for Arnette Felts, FNP.,have documented all relevant documentation on the behalf of Arnette Felts, FNP,as directed by  Arnette Felts, FNP while in the presence of Arnette Felts, FNP.  This visit occurred during the SARS-CoV-2 public health emergency.  Safety protocols were in place, including screening questions prior to the visit, additional usage of staff PPE, and extensive cleaning of exam room while observing appropriate contact time as indicated for disinfecting solutions.  Subjective:     Patient ID: Jordan [name] , female    DOB: 1934/02/01 , 86 y.o.   MRN: [id number]   Chief Complaint  Patient presents with  . Insomnia    HPI  Pt here today for Insomnia.  She did not get the medication Helios due to the possibility of having hallucinations and vivid dreams. Her daughter feels she has been sleeping however the patient states "I am not sleeping".  She has been to neurology in the past in 2018 for hallucinations possible dementia.  Spoke with daughter via phone during visit. She is here today with caregiver.     Past Medical History:  Diagnosis Date  . Allergy   . Anxiety    per pt's daughter  . Arthritis   . Asthma    pt reported  . Depression   . GERD (gastroesophageal reflux disease)   . Hearing deficit L  . High cholesterol   . Hypertension   . Osteoporosis   . Rheumatoid arthritis (HCC)   . Stroke [facility])      History reviewed. No pertinent family history.   Current Outpatient Medications:  .  albuterol (VENTOLIN HFA) 108 (90 Base) MCG/ACT inhaler, Inhale 1 puff into the lungs every 6 (six) hours as needed for wheezing or shortness of breath., Disp: 6.7 g, Rfl: 5 .  ALLERGY RELIEF 10 MG tablet, TAKE 1 TABLET BY MOUTH EVERY DAY (Patient taking differently: Take 10 mg by mouth daily. ), Disp: 90 tablet, Rfl: 0 .  amLODipine (NORVASC) 5 MG tablet, TAKE 1 TABLET BY MOUTH EVERY DAY, Disp: 90 tablet, Rfl: 0 .  aspirin EC 81 MG tablet,  Take 81 mg by mouth daily. Swallow whole., Disp: , Rfl:  .  brimonidine-timolol (COMBIGAN) 0.2-0.5 % ophthalmic solution, Place 1 drop into both eyes every 12 (twelve) hours., Disp: , Rfl:  .  ENBREL SURECLICK 50 MG/ML injection, Inject 0.98 mLs (50 mg total) into the skin once a week., Disp: 12 mL, Rfl: 0 .  feeding supplement, ENSURE ENLIVE, (ENSURE ENLIVE) LIQD, Take 237 mLs by mouth daily in the afternoon., Disp: , Rfl:  .  ferrous sulfate 325 (65 FE) MG tablet, Take 325 mg by mouth daily with breakfast., Disp: , Rfl:  .  fluticasone (FLONASE) 50 MCG/ACT nasal spray, INSTILL 2 SPRAYS IN EACH NOSTRIL EVERY DAY (Patient taking differently: Place 2 sprays into both nostrils daily as needed for allergies. ), Disp: 16 g, Rfl: 1 .  folic acid (FOLVITE) 1 MG tablet, TAKE 1 TABLET(1 MG) BY MOUTH DAILY (Patient taking differently: Take 1 mg by mouth daily. ), Disp: 90 tablet, Rfl: 0 .  latanoprost (XALATAN) 0.005 % ophthalmic solution, Place 1 drop into both eyes at bedtime. , Disp: , Rfl:  .  loratadine (CLARITIN) 10 MG tablet, Take 1 tablet (10 mg total) by mouth daily., Disp: 90 tablet, Rfl: 0 .  NAMZARIC 28-10 MG CP24, Take 1 capsule by mouth daily., Disp: 90 capsule, Rfl: 1 .  omeprazole (PRILOSEC) 20 MG capsule, Take  1 capsule (20 mg total) by mouth daily. TAKE 1 CAPSULE BY MOUTH EVERY DAY, Disp: 90 capsule, Rfl: 1 .  simvastatin (ZOCOR) 40 MG tablet, Take 1 tablet (40 mg total) by mouth at bedtime., Disp: 90 tablet, Rfl: 1 .  levothyroxine (SYNTHROID) 75 MCG tablet, Take 1 tablet (75 mcg total) by mouth daily before breakfast., Disp: 30 tablet, Rfl: 2 .  QUEtiapine (SEROQUEL) 25 MG tablet, Take 1 tablet (25 mg total) by mouth at bedtime., Disp: 30 tablet, Rfl: 2   Allergies  Allergen Reactions  . Penicillins Hives     Review of Systems  Constitutional: Negative.  Negative for fatigue.  HENT: Negative.   Eyes:       Legally blind  Respiratory: Negative.   Cardiovascular: Negative.   Negative for chest pain, palpitations and leg swelling.  Gastrointestinal: Negative.   Genitourinary: Negative.   Musculoskeletal: Negative.   Skin: Negative.   Neurological: Negative.  Negative for dizziness and headaches.  Psychiatric/Behavioral: Positive for sleep disturbance (unable to sleep).     Today's Vitals   05/14/20 1141  BP: 114/68  Pulse: 78  Temp: 97.8 F (36.6 C)  TempSrc: Oral  Weight: 94 lb 9.6 oz (42.9 kg)  Height: 5\' 1"  (1.549 m)  PainSc: 2   PainLoc: Back   Body mass index is 17.87 kg/m.   Objective:  Physical Exam Vitals reviewed.  Constitutional:      General: She is not in acute distress.    Appearance: Normal appearance.  Cardiovascular:     Rate and Rhythm: Normal rate and regular rhythm.     Pulses: Normal pulses.     Heart sounds: Normal heart sounds. No murmur heard.   Pulmonary:     Effort: Pulmonary effort is normal. No respiratory distress.     Breath sounds: Normal breath sounds. No wheezing.  Neurological:     General: No focal deficit present.     Mental Status: She is alert and oriented to person, place, and time.     Cranial Nerves: No cranial nerve deficit.  Psychiatric:        Mood and Affect: Mood normal.        Behavior: Behavior normal.        Thought Content: Thought content normal.        Judgment: Judgment normal.         Assessment And Plan:     1. Insomnia, unspecified type  She was on mirtazipine per the daughter but still not effective and did not want to start Helios due to side effects of possible vivid dreams and hallucinations.    I will start her on seroquel as recommended by Dr previously in which the patient and daughter does not feel she has taken this will help with sleep and hallucinations. If not effective will refer back to Neurology or Neuropsychology - QUEtiapine (SEROQUEL) 25 MG tablet; Take 1 tablet (25 mg total) by mouth at bedtime.  Dispense: 30 tablet; Refill: 2  2. Acquired  hypothyroidism  Chronic, continued to be overproducing at last visit  Will check levels today and make changes accordingly,will send 30 day supply at a time due to frequent changing of medication dose - T3, free - T4 - TSH    Patient was given opportunity to ask questions. Patient verbalized understanding of the plan and was able to repeat key elements of the plan. All questions were answered to their satisfaction.    Terrace Arabia, FNP, have reviewed  all documentation for this visit. The documentation on 05/15/20 for the exam, diagnosis, procedures, and orders are all accurate and complete.  THE PATIENT IS ENCOURAGED TO PRACTICE SOCIAL DISTANCING DUE TO THE COVID-19 PANDEMIC.

## 2020-05-15 ENCOUNTER — Other Ambulatory Visit: Payer: Self-pay | Admitting: Nurse Practitioner

## 2020-05-15 LAB — T4: T4, Total: 10.2 ug/dL (ref 4.5–12.0)

## 2020-05-15 LAB — T3, FREE: T3, Free: 2.7 pg/mL (ref 2.0–4.4)

## 2020-05-15 LAB — TSH: TSH: 0.038 u[IU]/mL — ABNORMAL LOW (ref 0.450–4.500)

## 2020-05-15 MED ORDER — LEVOTHYROXINE SODIUM 75 MCG PO TABS
75.0000 ug | ORAL_TABLET | Freq: Every day | ORAL | 2 refills | Status: DC
Start: 2020-05-15 — End: 2020-06-21

## 2020-05-18 ENCOUNTER — Other Ambulatory Visit: Payer: Self-pay

## 2020-05-18 ENCOUNTER — Ambulatory Visit: Payer: Self-pay

## 2020-05-18 ENCOUNTER — Telehealth: Payer: Medicare Other

## 2020-05-18 DIAGNOSIS — R413 Other amnesia: Secondary | ICD-10-CM

## 2020-05-18 DIAGNOSIS — E559 Vitamin D deficiency, unspecified: Secondary | ICD-10-CM

## 2020-05-18 DIAGNOSIS — M0579 Rheumatoid arthritis with rheumatoid factor of multiple sites without organ or systems involvement: Secondary | ICD-10-CM

## 2020-05-18 DIAGNOSIS — I10 Essential (primary) hypertension: Secondary | ICD-10-CM

## 2020-05-18 DIAGNOSIS — E039 Hypothyroidism, unspecified: Secondary | ICD-10-CM

## 2020-05-20 NOTE — Patient Instructions (Addendum)
Visit Information  Goals Addressed    . "I am not sure what Vitamin D dosage mother should be taking"   On track    Daughter stated Current Barriers:  [name] Knowledge Deficits related to evaluation and treatment of Vitamin D deficiency . Chronic Disease Management support and education needs related to HTN, RN, Acquired Hypothyroidism, Vitamin D deficiency, Memory loss   Nurse Case Manager Clinical Goal(s):  . New 05/18/20 Over the next 180 days, patient will work with the CCM team and PCP for disease education and support for improved Self Health management of Vitamin D deficiency   CCM RN CM Interventions:  05/18/20 call completed with patient's daughter [name]  . Evaluation of current treatment plan related to Vitamin D deficiency and patient's adherence to plan as established by provider. . Provided education to patient re: the importance of increasing Vitamin D level to help with immune health and bone support as well as lower risk for getting Cancer  . Reviewed medications with patient and discussed patient has completed the course of Vitamin d 50,000 units as prescribed; Determined patient will take 2000 units daily until getting her level rechecked in November at next OV with PCP . Mailed printed education material related to What is Vitamin D deficiency  . Discussed plans with patient for ongoing care management follow up and provided patient with direct contact information for care management team  Patient Self Care Activities:  . Unable to independently take medications as prescribed . Unable to self administer medications as prescribed  Please see past updates related to this goal by clicking on the "Past Updates" button in the selected goal      . I need to manage my thyroid condition   Not on track    Current Barriers:  [name] Knowledge Deficits related to disease state management of hypothyroidism . Chronic Disease Management support and education needs related to Hypertension,  Rheumatoid Arthritis, Vitamin D deficiency, Acquired Hypothyroidism, Memory Loss   Pharmacist Clinical Goal(s):  [name] Over the next 90 days, patient will demonstrate Improved medication adherence as evidenced by starting to take thyroid medication in the morning separate as prescribed  Goal Met . New 05/18/20 Over the next 60 days the patient will adhere to her medication regimen as discussed with patient primary provider related to medication management of thyroid medication  CCM RN CM Interventions: 05/18/20 call completed with patient's daughter [name] . Determined patient's TSH remains to be within abnormal range; Re-educated daughter of importance of taking thyroid medication in the mornings (30-60 min separate from food and other medications and 4 hours before taking vitamins and antiacids). . Discussed and reviewed recent dosage change with Levothyroxine decreased to 75 mcg qd before meals; Confirmed patient is taking this medication as directed w/o missed doses  . Discussed plans with patient for ongoing care management follow up and provided patient with direct contact information for care management team  Patient Self Care Activities:  . Unable to independently take medications as prescribed . Unable to self administer medications as prescribed  Please see past updates related to this goal by clicking on the "Past Updates" button in the selected goal      . I would like to manage my mother's blood pressure   On track    Current Barriers:  [name] Knowledge Deficits related to disease process and Self Health management of HTN . Chronic Disease Management support and education needs related to Hypertension, Rheumatoid Arthritis, Vitamin D deficiency, Acquired Hypothyroidism, Memory  Loss   Pharmacist Clinical Goal(s):  [name] Over the next 90 days, patient will work with CCM team and PCP to address needs related to optimized medication management of blood pressure  Goal Met . New 11/14/2019 Over the  next 60 days the patient will work with care management team and PCP to better manage HTN as evidenced by no ED visits - Goal Not Met . New 05/18/20 Over the next 180 days, patient will continue to work with the CCM team and PCP for disease education and support for improved Self Health management of HTN  CCM RN CM Interventions:  05/18/20 call completed with daughter [name] . Evaluation of current treatment plan related to Hypertension and patient's adherence to plan as established by provider . Determined patient had a IP event at [facility] from 03/24/20-03/26/20 for dx: hypotension, with the following d/c instructions: o Recommendations for Outpatient Follow-up:  1. Follow up with PCP in 1 week 2. Follow up with cardiology 3. Please follow up on the following pending results: None o Home Health: PT/OT o Equipment/Devices: None o Hospital course: o Hypotension o Unknown etiology. Patient is on amlodipine which has been discontinued. Concern for possible UTI which could have contributed. Blood cultures with no growth to date. Urine culture pending. BP has improved today. Transthoracic Echocardiogram unremarkable for significant valve disease. PT/OT recommendation for [facility] with 24 hour supervision. Discontinued lisinopril on discharge. o Essential hypertension o Complicated by above. BP moderately controlled. Will hold lisinopril on discharge and recommend follow-up prio rto restarting. o Chest pain o Recurrent pressure-like pain. No evidence for ACS. Flat troponin. With atherosclerotic disease, Cardiology was consulted buy story was more consistent with possible reflux disease. o Abnormal urinalysis o Concern for possible UTI. Patient with increased frequency but no dysuria. Started empirically on Levaquin secondary to penicillin allergy. Urine culture with multiple species. Patient received two doses but will not continue treatment. . Determined patient and daughter continue to monitor patient's  BP at home, daughter feels patient's BP is stable at this time with BP readings within normal range w/o hypotensive episodes being noted since being d/c on 03/26/20 . Advised patient, providing education and rationale, to monitor blood pressure daily and record, calling the CCM team and or PCP for findings outside established parameters . Discussed plans with patient for ongoing care management follow up and provided patient with direct contact information for care management team  Patient Self Care Activities:  . Unable to independently take medications and blood pressure readings as prescribed . Unable to self administer medications as prescribed  Please see past updates related to this goal by clicking on the "Past Updates" button in the selected goal        Patient verbalizes understanding of instructions provided today.   Telephone follow up appointment with care management team member scheduled for: 07/01/20  Barb Merino, RN, BSN, CCM Care Management Coordinator [place] [facility]  Direct Phone: [phone]

## 2020-05-20 NOTE — Chronic Care Management (AMB) (Signed)
Chronic Care Management   Follow Up Note   05/18/2020 Name: Jordan [name] MRN: [id number] DOB: [birth year]  Referred by: [name], FNP Reason for referral : Chronic Care Management (CCM RNCM FU Call )   Jordan [name] is a 86 y.o. year old female who is a primary care patient of [name], [place]. The CCM team was consulted for assistance with chronic disease management and care coordination needs.    Review of patient status, including review of consultants reports, relevant laboratory and other test results, and collaboration with appropriate care team members and the patient's provider was performed as part of comprehensive patient evaluation and provision of chronic care management services.    SDOH (Social Determinants of Health) assessments performed: Yes - no acute challenges identified at this time  See Care Plan activities for detailed interventions related to [name])   Placed outbound CCM RN CM follow up call to patient's daughter for a care plan update.     Outpatient Encounter Medications as of 05/18/2020  Medication Sig  . albuterol (VENTOLIN HFA) 108 (90 Base) MCG/ACT inhaler Inhale 1 puff into the lungs every 6 (six) hours as needed for wheezing or shortness of breath.  . ALLERGY RELIEF 10 MG tablet TAKE 1 TABLET BY MOUTH EVERY DAY (Patient taking differently: Take 10 mg by mouth daily. )  . amLODipine (NORVASC) 5 MG tablet TAKE 1 TABLET BY MOUTH EVERY DAY  . aspirin EC 81 MG tablet Take 81 mg by mouth daily. Swallow whole.  . brimonidine-timolol (COMBIGAN) 0.2-0.5 % ophthalmic solution Place 1 drop into both eyes every 12 (twelve) hours.  [name] SURECLICK 50 MG/ML injection Inject 0.98 mLs (50 mg total) into the skin once a week.  . feeding supplement, ENSURE ENLIVE, (ENSURE ENLIVE) LIQD Take 237 mLs by mouth daily in the afternoon.  . ferrous sulfate 325 (65 FE) MG tablet Take 325 mg by mouth daily with breakfast.  . fluticasone (FLONASE) 50 MCG/ACT nasal  spray INSTILL 2 SPRAYS IN EACH NOSTRIL EVERY DAY (Patient taking differently: Place 2 sprays into both nostrils daily as needed for allergies. )  . folic acid (FOLVITE) 1 MG tablet TAKE 1 TABLET(1 MG) BY MOUTH DAILY (Patient taking differently: Take 1 mg by mouth daily. )  . latanoprost (XALATAN) 0.005 % ophthalmic solution Place 1 drop into both eyes at bedtime.   [name] levothyroxine (SYNTHROID) 75 MCG tablet Take 1 tablet (75 mcg total) by mouth daily before breakfast.  . loratadine (CLARITIN) 10 MG tablet Take 1 tablet (10 mg total) by mouth daily.  [name] NAMZARIC 28-10 MG CP24 Take 1 capsule by mouth daily.  [name] omeprazole (PRILOSEC) 20 MG capsule Take 1 capsule (20 mg total) by mouth daily. TAKE 1 CAPSULE BY MOUTH EVERY DAY  . QUEtiapine (SEROQUEL) 25 MG tablet Take 1 tablet (25 mg total) by mouth at bedtime.  . simvastatin (ZOCOR) 40 MG tablet Take 1 tablet (40 mg total) by mouth at bedtime.   No facility-administered encounter medications on file as of 05/18/2020.     Objective:  No results found for: HGBA1C Lab Results  Component Value Date   MICROALBUR 30 02/21/2019   LDLCALC 86 03/26/2020   CREATININE 1.01 (H) 03/26/2020   BP Readings from Last 3 Encounters:  05/14/20 114/68  05/07/20 94/60  04/06/20 116/72    Goals Addressed    . "I am not sure what Vitamin D dosage mother should be taking"   On track    Daughter stated Current Barriers:  .  Knowledge Deficits related to evaluation and treatment of Vitamin D deficiency . Chronic Disease Management support and education needs related to HTN, RN, Acquired Hypothyroidism, Vitamin D deficiency, Memory loss   Nurse Case Manager Clinical Goal(s):  . New 05/18/20 Over the next 180 days, patient will work with the CCM team and PCP for disease education and support for improved Self Health management of Vitamin D deficiency   CCM RN CM Interventions:  05/18/20 call completed with patient's daughter Jordan [name]  . Evaluation of current  treatment plan related to Vitamin D deficiency and patient's adherence to plan as established by provider. . Provided education to patient re: the importance of increasing Vitamin D level to help with immune health and bone support as well as lower risk for getting Cancer  . Reviewed medications with patient and discussed patient has completed the course of Vitamin d 50,000 units as prescribed; Determined patient will take 2000 units daily until getting her level rechecked in November at next OV with PCP . Mailed printed education material related to What is Vitamin D deficiency  . Discussed plans with patient for ongoing care management follow up and provided patient with direct contact information for care management team  Patient Self Care Activities:  . Unable to independently take medications as prescribed . Unable to self administer medications as prescribed  Please see past updates related to this goal by clicking on the "Past Updates" button in the selected goal      . I need to manage my thyroid condition   Not on track    Current Barriers:  [name] Knowledge Deficits related to disease state management of hypothyroidism . Chronic Disease Management support and education needs related to Hypertension, Rheumatoid Arthritis, Vitamin D deficiency, Acquired Hypothyroidism, Memory Loss   Pharmacist Clinical Goal(s):  [name] Over the next 90 days, patient will demonstrate Improved medication adherence as evidenced by starting to take thyroid medication in the morning separate as prescribed  Goal Met . New 05/18/20 Over the next 60 days the patient will adhere to her medication regimen as discussed with patient primary provider related to medication management of thyroid medication  CCM RN CM Interventions: 05/18/20 call completed with patient's daughter Jordan [name] . Determined patient's TSH remains to be within abnormal range; Re-educated daughter of importance of taking thyroid medication in the mornings  (30-60 min separate from food and other medications and 4 hours before taking vitamins and antiacids). . Discussed and reviewed recent dosage change with Levothyroxine decreased to 75 mcg qd before meals; Confirmed patient is taking this medication as directed w/o missed doses  . Discussed plans with patient for ongoing care management follow up and provided patient with direct contact information for care management team  Patient Self Care Activities:  . Unable to independently take medications as prescribed . Unable to self administer medications as prescribed  Please see past updates related to this goal by clicking on the "Past Updates" button in the selected goal      . I would like to manage my mother's blood pressure   On track    Current Barriers:  [name] Knowledge Deficits related to disease process and Self Health management of HTN . Chronic Disease Management support and education needs related to Hypertension, Rheumatoid Arthritis, Vitamin D deficiency, Acquired Hypothyroidism, Memory Loss   Pharmacist Clinical Goal(s):  [name] Over the next 90 days, patient will work with CCM team and PCP to address needs related to optimized medication management of blood pressure  Goal Met . New 11/14/2019 Over the next 60 days the patient will work with care management team and PCP to better manage HTN as evidenced by no ED visits - Goal Not Met . New 05/18/20 Over the next 180 days, patient will continue to work with the CCM team and PCP for disease education and support for improved Self Health management of HTN  CCM RN CM Interventions:  05/18/20 call completed with daughter Jordan [name] . Evaluation of current treatment plan related to Hypertension and patient's adherence to plan as established by provider . Determined patient had a IP event at [facility] from 03/24/20-03/26/20 for dx: hypotension, with the following d/c instructions: o Recommendations for Outpatient Follow-up:  1. Follow up with PCP in 1  week 2. Follow up with cardiology 3. Please follow up on the following pending results: None o Home Health: PT/OT o Equipment/Devices: None o Hospital course: o Hypotension o Unknown etiology. Patient is on amlodipine which has been discontinued. Concern for possible UTI which could have contributed. Blood cultures with no growth to date. Urine culture pending. BP has improved today. Transthoracic Echocardiogram unremarkable for significant valve disease. PT/OT recommendation for [facility] with 24 hour supervision. Discontinued lisinopril on discharge. o Essential hypertension o Complicated by above. BP moderately controlled. Will hold lisinopril on discharge and recommend follow-up prio rto restarting. o Chest pain o Recurrent pressure-like pain. No evidence for ACS. Flat troponin. With atherosclerotic disease, Cardiology was consulted buy story was more consistent with possible reflux disease. o Abnormal urinalysis o Concern for possible UTI. Patient with increased frequency but no dysuria. Started empirically on Levaquin secondary to penicillin allergy. Urine culture with multiple species. Patient received two doses but will not continue treatment. . Determined patient and daughter continue to monitor patient's BP at home, daughter feels patient's BP is stable at this time with BP readings within normal range w/o hypotensive episodes being noted since being d/c on 03/26/20 . Advised patient, providing education and rationale, to monitor blood pressure daily and record, calling the CCM team and or PCP for findings outside established parameters . Discussed plans with patient for ongoing care management follow up and provided patient with direct contact information for care management team  Patient Self Care Activities:  . Unable to independently take medications and blood pressure readings as prescribed . Unable to self administer medications as prescribed  Please see past updates related to this goal  by clicking on the "Past Updates" button in the selected goal        Plan:   Telephone follow up appointment with care management team member scheduled for: 07/01/20  Barb Merino, RN, BSN, CCM Care Management Coordinator [place] [facility]  Direct Phone: [phone]

## 2020-05-26 ENCOUNTER — Other Ambulatory Visit: Payer: Self-pay | Admitting: Physician Assistant

## 2020-05-26 NOTE — Telephone Encounter (Signed)
90-day supply sent to pharmacy on 05/11/20.  Please clarify if the patient needs a refill or if she received enbrel.

## 2020-05-26 NOTE — Telephone Encounter (Signed)
Last Visit: 05/07/2020 Next Visit: 08/06/2020 Labs: 03/24/2020 Glucose 108, BUN 28, Creat 1.55 GFR 35, Hgb 10.8, Hct 34.2, MCV 73.5 MCH 23.2, MCHC 31.6, RDW 18.0 Lymphs Abs 3,914 TB Gold: 06/14/2019 Neg   Current Dose per office note 05/07/2020: Enbrel Sureclick 50 mg every 7 days DX: Rheumatoid arthritis involving multiple sites with positive rheumatoid factor   Okay to refill Enbrel?

## 2020-05-27 NOTE — Telephone Encounter (Signed)
Attempted to contact the patient and left message for patient to call the office.  

## 2020-06-08 ENCOUNTER — Other Ambulatory Visit: Payer: Self-pay | Admitting: Physician Assistant

## 2020-06-08 ENCOUNTER — Telehealth: Payer: Self-pay

## 2020-06-08 ENCOUNTER — Ambulatory Visit: Payer: Medicare Other

## 2020-06-08 DIAGNOSIS — I10 Essential (primary) hypertension: Secondary | ICD-10-CM

## 2020-06-08 DIAGNOSIS — M0579 Rheumatoid arthritis with rheumatoid factor of multiple sites without organ or systems involvement: Secondary | ICD-10-CM

## 2020-06-08 NOTE — Telephone Encounter (Signed)
Last Visit: 05/07/2020 Next Visit: 08/06/2020 Labs: 03/24/2020 Glucose 108, BUN 28, Creat 1.55 GFR 35, Hgb 10.8, Hct 34.2, MCV 73.5 MCH 23.2, MCHC 31.6, RDW 18.0 Lymphs Abs 3,914 TB Gold: 06/14/2019 Neg   Current Dose per office note 05/07/2020: Enbrel Sureclick 50 mg every 7 days DX: Rheumatoid arthritis involving multiple sites with positive rheumatoid factor   Left message for patient to advise she is due to update labs.   Okay to refill Enbrel?

## 2020-06-08 NOTE — Telephone Encounter (Signed)
Patient's daughter [name] called stating she was returning your call.

## 2020-06-08 NOTE — Telephone Encounter (Signed)
[name] advised Jordan [name] is due for labs. She states she will have Jordan [name] in this week to update.

## 2020-06-08 NOTE — Chronic Care Management (AMB) (Signed)
  Chronic Care Management    Social Work Follow Up Note  06/08/2020 Name: Jordan [name] MRN: [id number] DOB: [birth year]  Jordan [name] is a 86 y.o. year old female who is a primary care patient of Jordan Felts, FNP. The CCM team was consulted for assistance with care coordination.   Review of patient status, including review of consultants reports, other relevant assessments, and collaboration with appropriate care team members and the patient's provider was performed as part of comprehensive patient evaluation and provision of chronic care management services.    SW placed a successful outbound call to the patients daughter to assess for care coordination needs. No acute challenges are noted at this time.    Outpatient Encounter Medications as of 06/08/2020  Medication Sig  . albuterol (VENTOLIN HFA) 108 (90 Base) MCG/ACT inhaler Inhale 1 puff into the lungs every 6 (six) hours as needed for wheezing or shortness of breath.  . ALLERGY RELIEF 10 MG tablet TAKE 1 TABLET BY MOUTH EVERY DAY (Patient taking differently: Take 10 mg by mouth daily. )  . amLODipine (NORVASC) 5 MG tablet TAKE 1 TABLET BY MOUTH EVERY DAY  . aspirin EC 81 MG tablet Take 81 mg by mouth daily. Swallow whole.  . brimonidine-timolol (COMBIGAN) 0.2-0.5 % ophthalmic solution Place 1 drop into both eyes every 12 (twelve) hours.  Elgie Collard SURECLICK 50 MG/ML injection Inject 0.98 mLs (50 mg total) into the skin once a week.  . feeding supplement, ENSURE ENLIVE, (ENSURE ENLIVE) LIQD Take 237 mLs by mouth daily in the afternoon.  . ferrous sulfate 325 (65 FE) MG tablet Take 325 mg by mouth daily with breakfast.  . fluticasone (FLONASE) 50 MCG/ACT nasal spray INSTILL 2 SPRAYS IN EACH NOSTRIL EVERY DAY (Patient taking differently: Place 2 sprays into both nostrils daily as needed for allergies. )  . folic acid (FOLVITE) 1 MG tablet TAKE 1 TABLET(1 MG) BY MOUTH DAILY (Patient taking differently: Take 1 mg by mouth daily. )  .  latanoprost (XALATAN) 0.005 % ophthalmic solution Place 1 drop into both eyes at bedtime.   [name] levothyroxine (SYNTHROID) 75 MCG tablet Take 1 tablet (75 mcg total) by mouth daily before breakfast.  . loratadine (CLARITIN) 10 MG tablet Take 1 tablet (10 mg total) by mouth daily.  [name] NAMZARIC 28-10 MG CP24 Take 1 capsule by mouth daily.  [name] omeprazole (PRILOSEC) 20 MG capsule Take 1 capsule (20 mg total) by mouth daily. TAKE 1 CAPSULE BY MOUTH EVERY DAY  . QUEtiapine (SEROQUEL) 25 MG tablet Take 1 tablet (25 mg total) by mouth at bedtime.  . simvastatin (ZOCOR) 40 MG tablet Take 1 tablet (40 mg total) by mouth at bedtime.   No facility-administered encounter medications on file as of 06/08/2020.     Follow Up Plan: SW will follow up with patient by phone over the next 90 days.   Bevelyn Ngo, BSW, CDP Social Worker, Certified Dementia Practitioner [facility] Care Management [phone]

## 2020-06-11 ENCOUNTER — Other Ambulatory Visit: Payer: Self-pay | Admitting: *Deleted

## 2020-06-11 DIAGNOSIS — Z79899 Other long term (current) drug therapy: Secondary | ICD-10-CM

## 2020-06-12 IMAGING — CT CT CERVICAL SPINE W/O CM
3 of 4 series · 14 of 33 positions shown, 17 images · non-contrast
Comparison: None.

CLINICAL DATA: Status post fall.

EXAM:
CT CERVICAL SPINE WITHOUT CONTRAST
TECHNIQUE: Multidetector CT imaging of the cervical spine was performed without
intravenous contrast. Multiplanar CT image reconstructions were also
generated.

[Series 8: coronal bone · coronal · 0.23mm/px · 3 of 81 slices shown]
[im 17/81  bone]
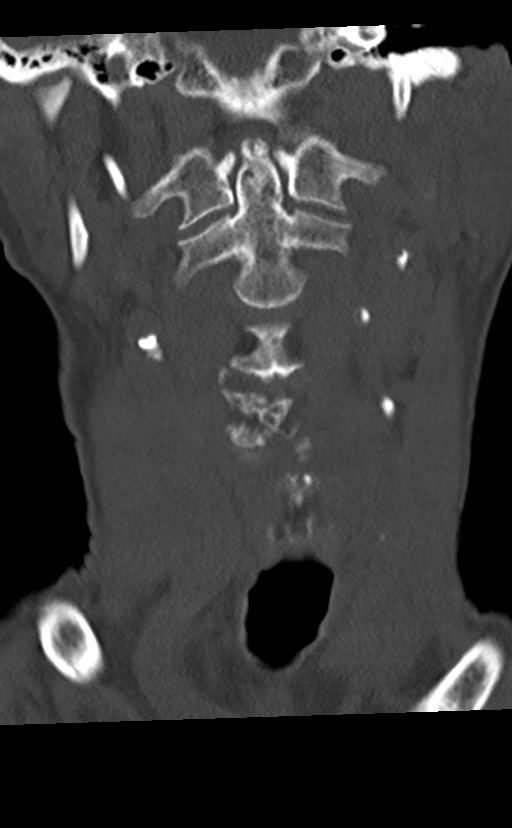
[im 33/81  bone]
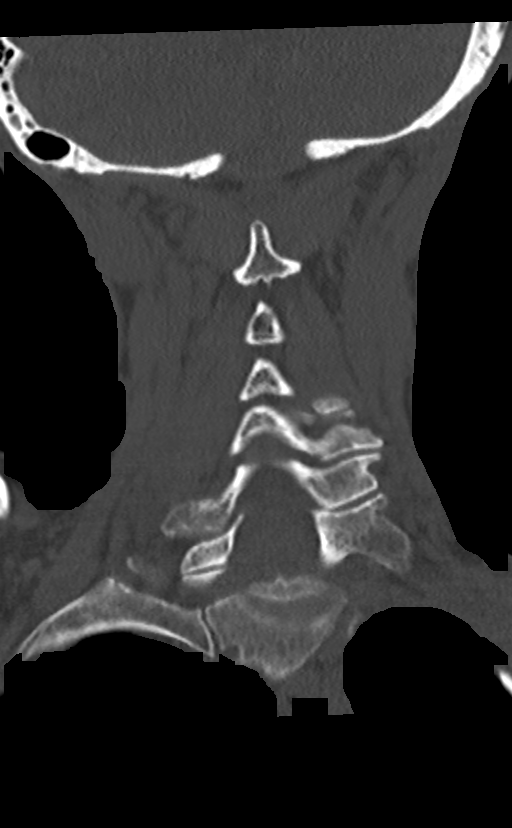
[im 49/81  bone]
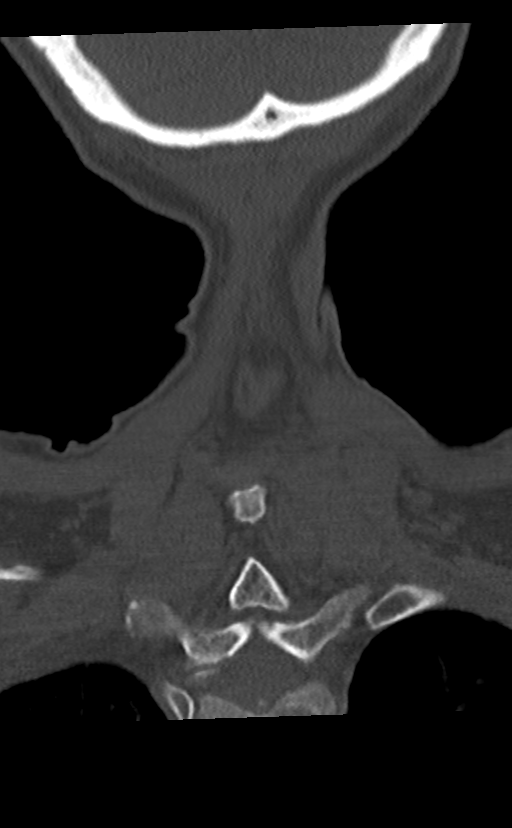

[Series 9: sagittal bone · sagittal · 0.36mm/px · 5 of 61 slices shown, 6 images]
[im 21/61  bone]
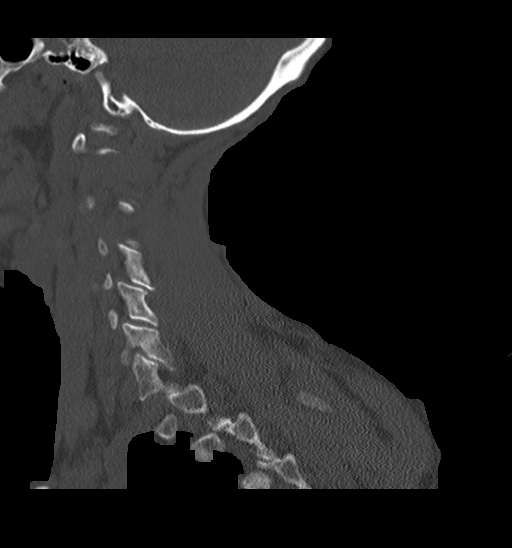
[im 26/61  bone]
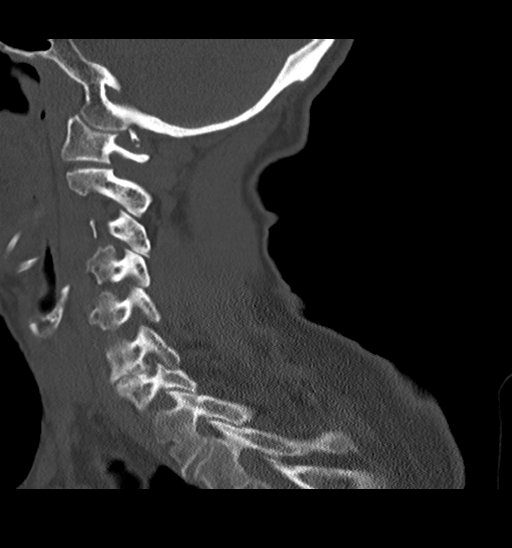
[im 31/61  soft-tissue]
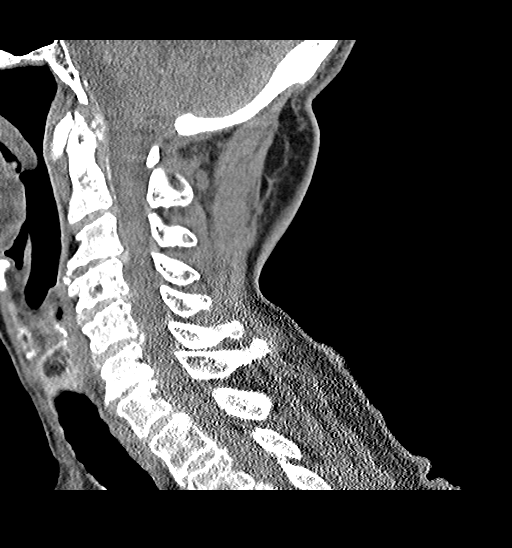
[im 31/61  bone]
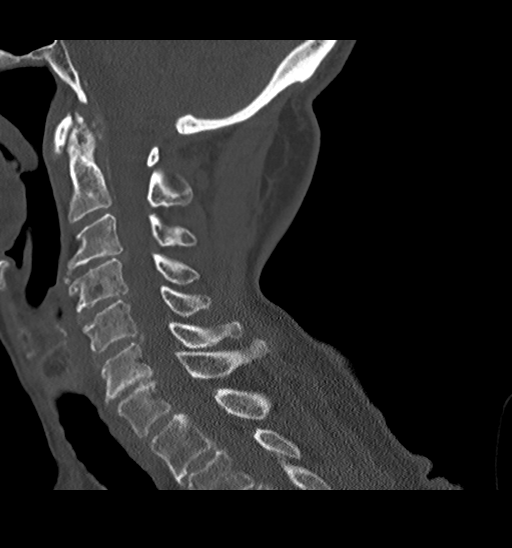
[im 36/61  bone]
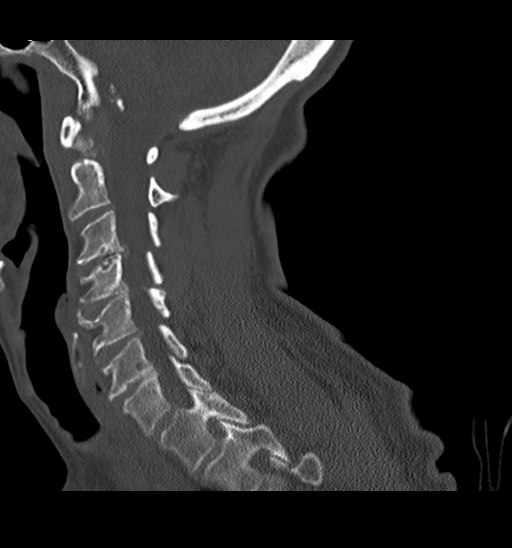
[im 41/61  bone]
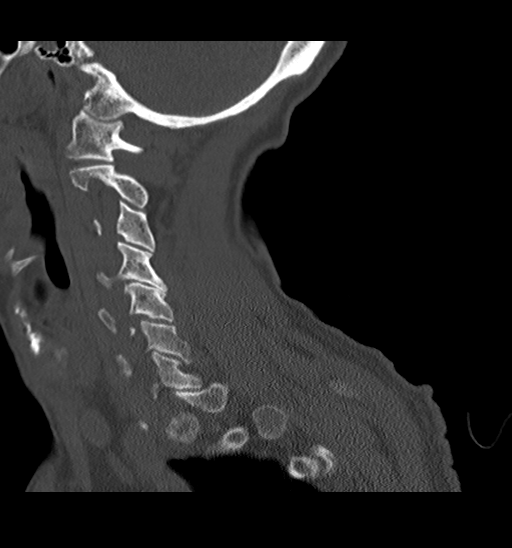

[Series 11: orthogonal axial st · axial · 0.21mm/px · z∈[-240,-138]mm · 6 of 105 slices shown, 8 images]
[im 15/105  soft-tissue]
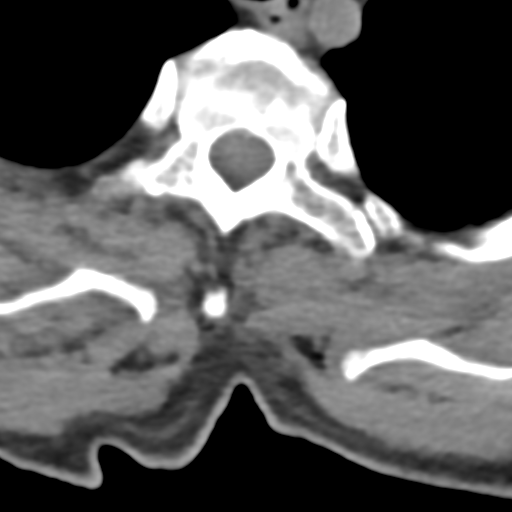
[im 15/105  bone]
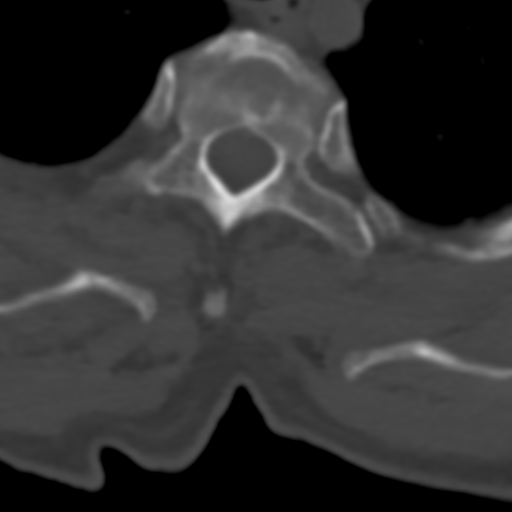
[im 30/105  bone]
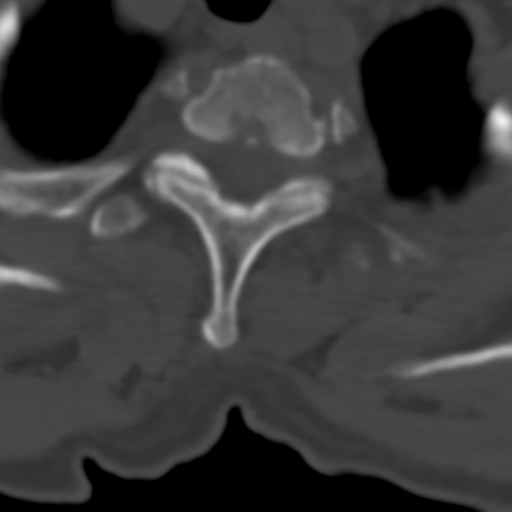
[im 45/105  bone]
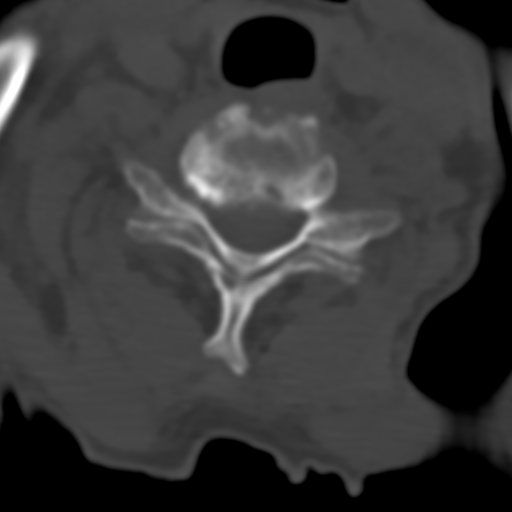
[im 60/105  bone]
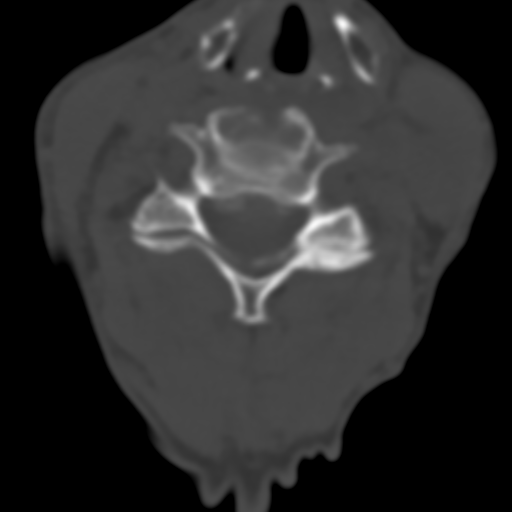
[im 75/105  soft-tissue]
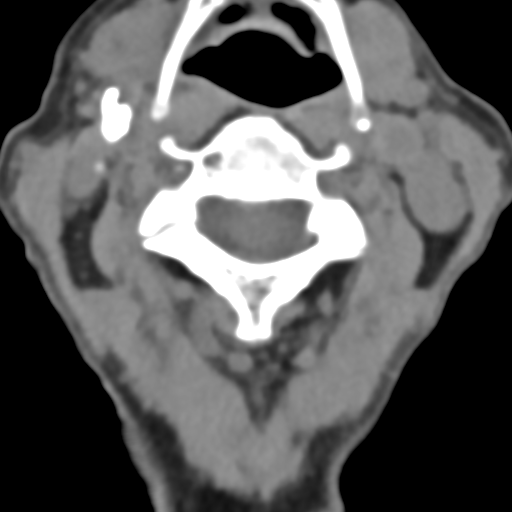
[im 75/105  bone]
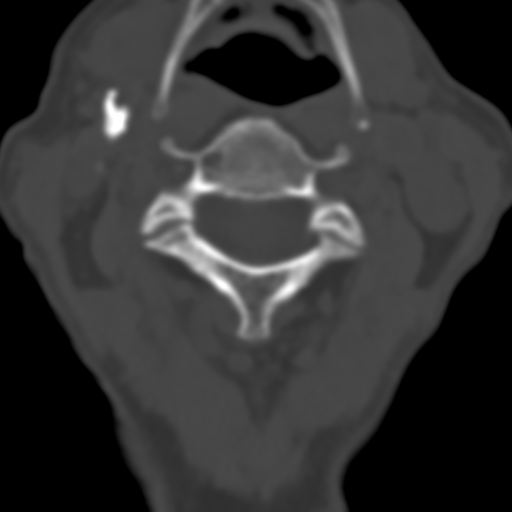
[im 90/105  bone]
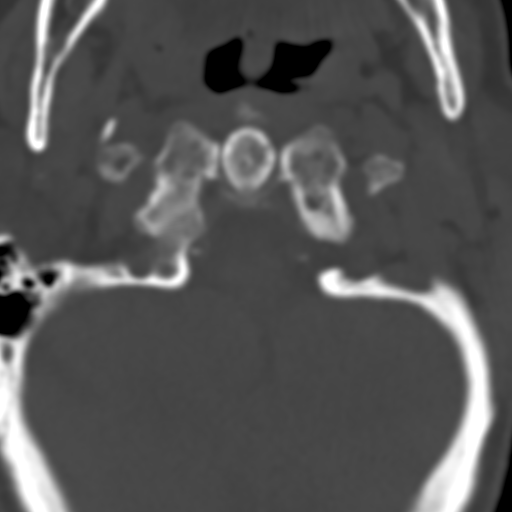

[14 of 33 positions shown; findings below may reference images not displayed]

FINDINGS: Alignment: Normal.

Skull base and vertebrae: No acute fracture. No primary bone lesion
or focal pathologic process.

Soft tissues and spinal canal: No prevertebral fluid or swelling. No
visible canal hematoma.

Disc levels: Moderate severity endplate sclerosis is seen at the
levels of C3-C4, C4-C5, C5-C6 and C6-C7. Mild-to-moderate severity
multilevel intervertebral disc space narrowing is also seen at these
levels.

There is bilateral mild-to-moderate severity multilevel facet joint
hypertrophy, left greater than right.

Upper chest: Negative.

Other: None.
IMPRESSION: 1. No acute osseous abnormality of the cervical spine.
2. Moderate severity multilevel degenerative changes.

## 2020-06-12 NOTE — Progress Notes (Signed)
MCV, MCH, MCHC are low but improving. Hemoglobin and hematocrit are WNL.  CMP WNL.

## 2020-06-13 LAB — CBC WITH DIFFERENTIAL/PLATELET
Absolute Monocytes: 924 cells/uL (ref 200–950)
Basophils Absolute: 63 cells/uL (ref 0–200)
Basophils Relative: 0.8 %
Eosinophils Absolute: 47 cells/uL (ref 15–500)
Eosinophils Relative: 0.6 %
HCT: 36.1 % (ref 35.0–45.0)
Hemoglobin: 11.7 g/dL (ref 11.7–15.5)
Lymphs Abs: 3255 cells/uL (ref 850–3900)
MCH: 24.1 pg — ABNORMAL LOW (ref 27.0–33.0)
MCHC: 32.4 g/dL (ref 32.0–36.0)
MCV: 74.3 fL — ABNORMAL LOW (ref 80.0–100.0)
MPV: 12.3 fL (ref 7.5–12.5)
Monocytes Relative: 11.7 %
Neutro Abs: 3610 cells/uL (ref 1500–7800)
Neutrophils Relative %: 45.7 %
Platelets: 202 10*3/uL (ref 140–400)
RBC: 4.86 10*6/uL (ref 3.80–5.10)
RDW: 15.5 % — ABNORMAL HIGH (ref 11.0–15.0)
Total Lymphocyte: 41.2 %
WBC: 7.9 10*3/uL (ref 3.8–10.8)

## 2020-06-13 LAB — COMPLETE METABOLIC PANEL WITH GFR
AG Ratio: 1.8 (calc) (ref 1.0–2.5)
ALT: 7 U/L (ref 6–29)
AST: 13 U/L (ref 10–35)
Albumin: 4.2 g/dL (ref 3.6–5.1)
Alkaline phosphatase (APISO): 48 U/L (ref 37–153)
BUN: 20 mg/dL (ref 7–25)
CO2: 27 mmol/L (ref 20–32)
Calcium: 9.4 mg/dL (ref 8.6–10.4)
Chloride: 101 mmol/L (ref 98–110)
Creat: 0.72 mg/dL (ref 0.60–0.88)
GFR, Est African American: 89 mL/min/{1.73_m2} (ref >=60)
GFR, Est Non African American: 77 mL/min/{1.73_m2} (ref >=60)
Globulin: 2.4 g/dL (calc) (ref 1.9–3.7)
Glucose, Bld: 75 mg/dL (ref 65–99)
Potassium: 4.2 mmol/L (ref 3.5–5.3)
Sodium: 137 mmol/L (ref 135–146)
Total Bilirubin: 0.4 mg/dL (ref 0.2–1.2)
Total Protein: 6.6 g/dL (ref 6.1–8.1)

## 2020-06-13 LAB — QUANTIFERON-TB GOLD PLUS
Mitogen-NIL: >10 IU/mL
NIL: 0.03 IU/mL
QuantiFERON-TB Gold Plus: NEGATIVE
TB1-NIL: 0.01 IU/mL
TB2-NIL: 0 IU/mL

## 2020-06-15 NOTE — Progress Notes (Signed)
TB Gold is negative.

## 2020-06-15 NOTE — Progress Notes (Signed)
I,Yamilka Roman [employer] as a Neurosurgeon for [employer], FNP.,have documented all relevant documentation on the behalf of Arnette Felts, FNP,as directed by  Arnette Felts, FNP while in the presence of Arnette Felts, FNP. This visit occurred during the SARS-CoV-2 public health emergency.  Safety protocols were in place, including screening questions prior to the visit, additional usage of staff PPE, and extensive cleaning of exam room while observing appropriate contact time as indicated for disinfecting solutions.  Subjective:     Patient ID: Jordan [name] , female    DOB: 1934/03/23 , 86 y.o.   MRN: [id number]   Chief Complaint  Patient presents with  . Insomnia  . vitamin d recheck    HPI  Pt here today for Insomnia. She is taking the Seroquel nightly and reports she is not sleeping well.  Her daughter [name] reports she is not walking as much. She is not having any side effects from the Seroquel.  Continues to not have any falls.   She is having a new onset of right eye redness with pain. She has been using eye drops to her eye that was supposed to last 45 days and is gone already after one week. She has also been using a cream the family is unsure of the name around her eye.   Wt Readings from Last 3 Encounters: 06/16/20 : 94 lb (42.6 kg) 05/14/20 : 94 lb 9.6 oz (42.9 kg) 05/07/20 : 95 lb 3.2 oz (43.2 kg)  She is here with her caregiver.      Past Medical History:  Diagnosis Date  . Allergy   . Anxiety    per pt's daughter  . Arthritis   . Asthma    pt reported  . Depression   . GERD (gastroesophageal reflux disease)   . Hearing deficit L  . High cholesterol   . Hypertension   . Osteoporosis   . Rheumatoid arthritis (HCC)   . Stroke [facility])      History reviewed. No pertinent family history.   Current Outpatient Medications:  .  albuterol (VENTOLIN HFA) 108 (90 Base) MCG/ACT inhaler, Inhale 1 puff into the lungs every 6 (six) hours as needed for wheezing or  shortness of breath., Disp: 6.7 g, Rfl: 5 .  ALLERGY RELIEF 10 MG tablet, TAKE 1 TABLET BY MOUTH EVERY DAY (Patient taking differently: Take 10 mg by mouth daily. ), Disp: 90 tablet, Rfl: 0 .  amLODipine (NORVASC) 5 MG tablet, TAKE 1 TABLET BY MOUTH EVERY DAY, Disp: 90 tablet, Rfl: 0 .  aspirin EC 81 MG tablet, Take 81 mg by mouth daily. Swallow whole., Disp: , Rfl:  .  brimonidine-timolol (COMBIGAN) 0.2-0.5 % ophthalmic solution, Place 1 drop into both eyes every 12 (twelve) hours., Disp: , Rfl:  .  ENBREL SURECLICK 50 MG/ML injection, Inject 0.98 mLs (50 mg total) into the skin once a week., Disp: 4 mL, Rfl: 0 .  feeding supplement, ENSURE ENLIVE, (ENSURE ENLIVE) LIQD, Take 237 mLs by mouth daily in the afternoon., Disp: , Rfl:  .  ferrous sulfate 325 (65 FE) MG tablet, Take 325 mg by mouth daily with breakfast., Disp: , Rfl:  .  fluticasone (FLONASE) 50 MCG/ACT nasal spray, INSTILL 2 SPRAYS IN EACH NOSTRIL EVERY DAY (Patient taking differently: Place 2 sprays into both nostrils daily as needed for allergies. ), Disp: 16 g, Rfl: 1 .  folic acid (FOLVITE) 1 MG tablet, TAKE 1 TABLET(1 MG) BY MOUTH DAILY (Patient taking differently: Take 1 mg by  mouth daily. ), Disp: 90 tablet, Rfl: 0 .  latanoprost (XALATAN) 0.005 % ophthalmic solution, Place 1 drop into both eyes at bedtime. , Disp: , Rfl:  .  levothyroxine (SYNTHROID) 75 MCG tablet, Take 1 tablet (75 mcg total) by mouth daily before breakfast., Disp: 30 tablet, Rfl: 2 .  loratadine (CLARITIN) 10 MG tablet, Take 1 tablet (10 mg total) by mouth daily., Disp: 90 tablet, Rfl: 0 .  NAMZARIC 28-10 MG CP24, Take 1 capsule by mouth daily., Disp: 90 capsule, Rfl: 1 .  omeprazole (PRILOSEC) 20 MG capsule, Take 1 capsule (20 mg total) by mouth daily. TAKE 1 CAPSULE BY MOUTH EVERY DAY, Disp: 90 capsule, Rfl: 1 .  QUEtiapine (SEROQUEL) 25 MG tablet, Take 1 tablet (25 mg total) by mouth at bedtime., Disp: 30 tablet, Rfl: 2 .  simvastatin (ZOCOR) 40 MG tablet,  Take 1 tablet (40 mg total) by mouth at bedtime., Disp: 90 tablet, Rfl: 1   Allergies  Allergen Reactions  . Penicillins Hives     Review of Systems  Constitutional: Negative.   HENT:       Caregiver reports patient has been using a bobbi pin to scratch her ears  Respiratory: Negative.  Negative for cough and shortness of breath.   Cardiovascular: Negative.  Negative for chest pain, palpitations and leg swelling.  Endocrine: Negative for polydipsia, polyphagia and polyuria.  Neurological: Negative for dizziness and headaches.  Psychiatric/Behavioral: Negative.      Today's Vitals   06/16/20 0937  BP: 116/68  Pulse: 60  Temp: 97.6 F (36.4 C)  TempSrc: Oral  Weight: 94 lb (42.6 kg)  Height: 5\' 1"  (1.549 m)  PainSc: 0-No pain   Body mass index is 17.76 kg/m.   Objective:  Physical Exam Vitals reviewed.  Constitutional:      General: She is not in acute distress.    Appearance: Normal appearance.  HENT:     Right Ear: Tympanic membrane, ear canal and external ear normal. There is no impacted cerumen.     Left Ear: Ear canal and external ear normal. There is no impacted cerumen.     Ears:     Comments: She has an area that has dried blood present at 5 o'clock.    Left ear TM is bulging Cardiovascular:     Rate and Rhythm: Normal rate and regular rhythm.     Pulses: Normal pulses.     Heart sounds: Normal heart sounds. No murmur heard.   Pulmonary:     Effort: Pulmonary effort is normal. No respiratory distress.     Breath sounds: Normal breath sounds. No wheezing.  Neurological:     General: No focal deficit present.     Mental Status: She is alert and oriented to person, place, and time.     Cranial Nerves: No cranial nerve deficit.     Motor: No weakness.  Psychiatric:        Mood and Affect: Mood normal.        Behavior: Behavior normal.        Thought Content: Thought content normal.        Judgment: Judgment normal.         Assessment And Plan:      1. Insomnia, unspecified type  She is doing better with the Seroquel and at this time we will not make any changes at the daughters request. She feels her mother has been resting better. She is going to get Remfresh as well to  see if this helps the patient  2. Acquired hypothyroidism  I decreased the dose of her synthroid at her last visit   Will recheck levels .  - TSH - T4 - T3, free  3. Blind painful right eye  I have advised her to contact her eye doctor, there is some redness noted to sclera as well. Her daughter "[name]" is planning to call after this visit.   4. Tinnitus of left ear  TM bulging  Encouraged to take antihistamine daily  No abnormal sounds to carotid   5. Alzheimer's dementia without behavioral disturbance, unspecified timing of dementia onset (HCC) Continue with namenda  I have encouraged her to drink a protein shake daily as her BMI is 17.76   Patient was given opportunity to ask questions. Patient verbalized understanding of the plan and was able to repeat key elements of the plan. All questions were answered to their satisfaction.   Jeanell Sparrow, FNP, have reviewed all documentation for this visit. The documentation on 06/16/20 for the exam, diagnosis, procedures, and orders are all accurate and complete.  THE PATIENT IS ENCOURAGED TO PRACTICE SOCIAL DISTANCING DUE TO THE COVID-19 PANDEMIC.

## 2020-06-15 NOTE — Patient Instructions (Addendum)

## 2020-06-16 ENCOUNTER — Other Ambulatory Visit: Payer: Self-pay

## 2020-06-16 ENCOUNTER — Encounter: Payer: Self-pay | Admitting: Nurse Practitioner

## 2020-06-16 ENCOUNTER — Ambulatory Visit (INDEPENDENT_AMBULATORY_CARE_PROVIDER_SITE_OTHER): Payer: Medicare Other | Admitting: Nurse Practitioner

## 2020-06-16 VITALS — BP 116/68 | HR 60 | Temp 97.6°F | Ht 61.0 in | Wt 94.0 lb

## 2020-06-16 DIAGNOSIS — G309 Alzheimer's disease, unspecified: Secondary | ICD-10-CM

## 2020-06-16 DIAGNOSIS — F0280 Dementia in other diseases classified elsewhere without behavioral disturbance: Secondary | ICD-10-CM

## 2020-06-16 DIAGNOSIS — H5440 Blindness, one eye, unspecified eye: Secondary | ICD-10-CM | POA: Diagnosis not present

## 2020-06-16 DIAGNOSIS — E039 Hypothyroidism, unspecified: Secondary | ICD-10-CM | POA: Diagnosis not present

## 2020-06-16 DIAGNOSIS — H5711 Ocular pain, right eye: Secondary | ICD-10-CM

## 2020-06-16 DIAGNOSIS — G4700 Insomnia, unspecified: Secondary | ICD-10-CM

## 2020-06-16 DIAGNOSIS — H9312 Tinnitus, left ear: Secondary | ICD-10-CM

## 2020-06-17 DIAGNOSIS — H44512 Absolute glaucoma, left eye: Secondary | ICD-10-CM | POA: Diagnosis not present

## 2020-06-17 DIAGNOSIS — H401113 Primary open-angle glaucoma, right eye, severe stage: Secondary | ICD-10-CM | POA: Diagnosis not present

## 2020-06-17 LAB — T4: T4, Total: 8.9 ug/dL (ref 4.5–12.0)

## 2020-06-17 LAB — T3, FREE: T3, Free: 2.2 pg/mL (ref 2.0–4.4)

## 2020-06-17 LAB — TSH: TSH: 0.112 u[IU]/mL — ABNORMAL LOW (ref 0.450–4.500)

## 2020-06-21 MED ORDER — LEVOTHYROXINE SODIUM 50 MCG PO TABS: 50.0000 ug | ORAL_TABLET | Freq: Every day | ORAL | 2 refills | Status: DC

## 2020-06-24 NOTE — Progress Notes (Signed)
Cardiology Office Note:   Date:  06/26/2020  NAME:  Jordan [name]    MRN: [id number] DOB:  12-06-1933   PCP:  Arnette Felts, FNP  Cardiologist:  No primary care provider on file.   Referring MD: Arnette Felts, FNP   Chief Complaint  Patient presents with  . Follow-up    3-4 months.   History of Present Illness:   Jordan [name] is a 86 y.o. female with a hx of CAD, dementia, RA, Glaucoma who presents for follow-up of chest pain. Seen in the hospital 8/31 for chest pain consistent with GERD. Protonix recommended. EKG normal, troponin normal, echo normal.  She presents with her niece.  She reports she is doing well.  She had no further episodes of chest pain.  She does suffer from dementia.  She is also been losing weight.  She is not hyperthyroid based on recent thyroid function testing.  Her primary care physician is rearranging medications.  She is not active but mainly sits around the house.  She denies any chest pain, shortness of breath or palpitations.  She overall seems to be doing well.  She denies any symptoms of angina, shortness of breath.  Her acid reflux is well controlled on Protonix.  No further chest pain episodes.  Problem List 1. Dementia 2. Glaucoma/legally blind 3. HTN 4. HLD -T chol 156, HDL 57, LDL 86, TG 66 5. Rheumatoid arthritis 6. CAD  -coronary calcifications on CTA chest   Past Medical History: Past Medical History:  Diagnosis Date  . Allergy   . Anxiety    per pt's daughter  . Arthritis   . Asthma    pt reported  . Depression   . GERD (gastroesophageal reflux disease)   . Hearing deficit L  . High cholesterol   . Hypertension   . Osteoporosis   . Rheumatoid arthritis (HCC)   . Stroke [facility])     Past Surgical History: Past Surgical History:  Procedure Laterality Date  . ABDOMINAL HYSTERECTOMY    . KNEE ARTHROPLASTY    . REPLACEMENT TOTAL KNEE BILATERAL Bilateral   . WRIST SURGERY Left     Current Medications: Current Meds    Medication Sig  . albuterol (VENTOLIN HFA) 108 (90 Base) MCG/ACT inhaler Inhale 1 puff into the lungs every 6 (six) hours as needed for wheezing or shortness of breath.  . ALLERGY RELIEF 10 MG tablet TAKE 1 TABLET BY MOUTH EVERY DAY (Patient taking differently: Take 10 mg by mouth daily. )  . amLODipine (NORVASC) 5 MG tablet TAKE 1 TABLET BY MOUTH EVERY DAY  . aspirin EC 81 MG tablet Take 81 mg by mouth daily. Swallow whole.  . brimonidine-timolol (COMBIGAN) 0.2-0.5 % ophthalmic solution Place 1 drop into both eyes every 12 (twelve) hours.  Elgie Collard SURECLICK 50 MG/ML injection Inject 0.98 mLs (50 mg total) into the skin once a week.  . feeding supplement, ENSURE ENLIVE, (ENSURE ENLIVE) LIQD Take 237 mLs by mouth daily in the afternoon.  . ferrous sulfate 325 (65 FE) MG tablet Take 325 mg by mouth daily with breakfast.  . fluticasone (FLONASE) 50 MCG/ACT nasal spray INSTILL 2 SPRAYS IN EACH NOSTRIL EVERY DAY (Patient taking differently: Place 2 sprays into both nostrils daily as needed for allergies. )  . folic acid (FOLVITE) 1 MG tablet TAKE 1 TABLET(1 MG) BY MOUTH DAILY (Patient taking differently: Take 1 mg by mouth daily. )  . latanoprost (XALATAN) 0.005 % ophthalmic solution Place 1 drop into both eyes  at bedtime.   [name] levothyroxine (SYNTHROID) 50 MCG tablet Take 1 tablet (50 mcg total) by mouth daily before breakfast.  . loratadine (CLARITIN) 10 MG tablet Take 1 tablet (10 mg total) by mouth daily.  [name] NAMZARIC 28-10 MG CP24 Take 1 capsule by mouth daily.  [name] omeprazole (PRILOSEC) 20 MG capsule Take 1 capsule (20 mg total) by mouth daily. TAKE 1 CAPSULE BY MOUTH EVERY DAY  . QUEtiapine (SEROQUEL) 25 MG tablet Take 1 tablet (25 mg total) by mouth at bedtime.  . simvastatin (ZOCOR) 40 MG tablet Take 1 tablet (40 mg total) by mouth at bedtime.     Allergies:    Penicillins   Social History: Social History   Socioeconomic History  . Marital status: Single    Spouse name: Not on file  .  Number of children: 2  . Years of education: 8th  . Highest education level: Not on file  Occupational History  . Occupation: Retired  Tobacco Use  . Smoking status: Former Smoker    Packs/day: 0.25    Years: 13.00    Pack years: 3.25    Types: Cigarettes    Quit date: 08/18/2006    Years since quitting: 13.8  . Smokeless tobacco: Never Used  Vaping Use  . Vaping Use: Never used  Substance and Sexual Activity  . Alcohol use: No  . Drug use: Not Currently    Types: Hydrocodone  . Sexual activity: Not Currently  Other Topics Concern  . Not on file  Social History Narrative   Lives at home with grandson.   Right-handed.   2-3 cups coffee per day.   Social Determinants of Health   Financial Resource Strain: Low Risk   . Difficulty of Paying Living Expenses: Not hard at all  Food Insecurity: No Food Insecurity  . Worried About Programme researcher, broadcasting/film/video in the Last Year: Never true  . Ran Out of Food in the Last Year: Never true  Transportation Needs: No Transportation Needs  . Lack of Transportation (Medical): No  . Lack of Transportation (Non-Medical): No  Physical Activity: Inactive  . Days of Exercise per Week: 0 days  . Minutes of Exercise per Session: 0 min  Stress: No Stress Concern Present  . Feeling of Stress : Not at all  Social Connections:   . Frequency of Communication with Friends and Family: Not on file  . Frequency of Social Gatherings with Friends and Family: Not on file  . Attends Religious Services: Not on file  . Active Member of Clubs or Organizations: Not on file  . Attends Banker Meetings: Not on file  . Marital Status: Not on file     Family History: The patient's family history is not on file.  ROS:   All other ROS reviewed and negative. Pertinent positives noted in the HPI.     EKGs/Labs/Other Studies Reviewed:   The following studies were personally reviewed by me today:  TTE 03/25/2020 1. Left ventricular ejection fraction, by  estimation, is 60 to 65%. The  left ventricle has normal function. The left ventricle has no regional  wall motion abnormalities. Left ventricular diastolic parameters were  normal.  2. Right ventricular systolic function is normal. The right ventricular  size is normal. There is normal pulmonary artery systolic pressure. The  estimated right ventricular systolic pressure is 26.0 mmHg.  3. The mitral valve is normal in structure. Trivial mitral valve  regurgitation. No evidence of mitral stenosis.  4.  The aortic valve is normal in structure. Aortic valve regurgitation is  not visualized. No aortic stenosis is present.  5. The inferior vena cava is normal in size with greater than 50%  respiratory variability, suggesting right atrial pressure of 3 mmHg.     Recent Labs: 06/11/2020: ALT 7; BUN 20; Creat 0.72; Hemoglobin 11.7; Platelets 202; Potassium 4.2; Sodium 137 06/16/2020: TSH 0.112   Recent Lipid Panel    Component Value Date/Time   CHOL 156 03/26/2020 1044   TRIG 66 03/26/2020 1044   HDL 57 03/26/2020 1044   CHOLHDL 2.7 03/26/2020 1044   VLDL 13 03/26/2020 1044   LDLCALC 86 03/26/2020 1044    Physical Exam:   VS:  BP 130/84 (BP Location: Left Arm, Patient Position: Sitting, Cuff Size: Normal)   Pulse 80   Ht 5\' 1"  (1.549 m)   Wt 91 lb (41.3 kg)   BMI 17.19 kg/m    Wt Readings from Last 3 Encounters:  06/26/20 91 lb (41.3 kg)  06/16/20 94 lb (42.6 kg)  05/14/20 94 lb 9.6 oz (42.9 kg)    General: Well nourished, well developed, in no acute distress Heart: Atraumatic, normal size  Eyes: PEERLA, EOMI  Neck: Supple, no JVD Endocrine: No thryomegaly Cardiac: Normal S1, S2; RRR; no murmurs, rubs, or gallops Lungs: Clear to auscultation bilaterally, no wheezing, rhonchi or rales  Abd: Soft, nontender, no hepatomegaly  Ext: No edema, pulses 2+ Musculoskeletal: No deformities, BUE and BLE strength normal and equal Skin: Warm and dry, no rashes   Neuro: Alert and  oriented to person, place, time, and situation, CNII-XII grossly intact, no focal deficits  Psych: Normal mood and affect   ASSESSMENT:   Lodie Waheed is a 86 y.o. female who presents for the following: 1. Coronary artery disease involving native coronary artery of native heart without angina pectoris   2. Other chest pain   3. Gastroesophageal reflux disease without esophagitis   4. Primary hypertension     PLAN:   1. Coronary artery disease involving native coronary artery of native heart without angina pectoris -She had coronary calcification seen on CT chest.  She does have a long history of smoking.  She does have advanced dementia.  She describes no symptoms of angina.  She is not that active.  She is 91 pounds.  I recommended continue aspirin and statin therapy.  Would not recommend aggressive care given her dementia. -From my standpoint, she will see 97 as needed.  Her EKG and echocardiogram were normal in the hospital.  2. Other chest pain 3. Gastroesophageal reflux disease without esophagitis -Seen in the hospital for atypical chest pain.  Symptoms were consistent with GERD.  She has been started on Protonix with no further recurrence of chest pain.  She will see [place] as needed.  4. Primary hypertension -Blood pressure well controlled on current medications.  Disposition: Return if symptoms worsen or fail to improve.  Medication Adjustments/Labs and Tests Ordered: Current medicines are reviewed at length with the patient today.  Concerns regarding medicines are outlined above.  No orders of the defined types were placed in this encounter.  No orders of the defined types were placed in this encounter.   Patient Instructions   Follow-Up: At [facility], you and your health needs are our priority.  As part of our continuing mission to provide you with exceptional heart care, we have created designated Provider Care Teams.  These Care Teams include your primary  Cardiologist (physician) and  Advanced Practice Providers (APPs -  Physician Assistants and Nurse Practitioners) who all work together to provide you with the care you need, when you need it.  We recommend signing up for the patient portal called "MyChart".  Sign up information is provided on this After Visit Summary.  MyChart is used to connect with patients for Virtual Visits (Telemedicine).  Patients are able to view lab/test results, encounter notes, upcoming appointments, etc.  Non-urgent messages can be sent to your provider as well.   To learn more about what you can do with MyChart, go to [email or web address].    Your next appointment:    As needed    Time Spent with Patient: I have spent a total of 25 minutes with patient reviewing hospital notes, telemetry, EKGs, labs and examining the patient as well as establishing an assessment and plan that was discussed with the patient.  > 50% of time was spent in direct patient care.  Signed, Lenna Gilford. Flora Lipps, MD [facility]  [address], [address] [place], [place] [address] [phone]  06/26/2020 12:20 PM

## 2020-06-26 ENCOUNTER — Ambulatory Visit (INDEPENDENT_AMBULATORY_CARE_PROVIDER_SITE_OTHER): Payer: Medicare Other | Admitting: Cardiovascular Disease

## 2020-06-26 ENCOUNTER — Other Ambulatory Visit: Payer: Self-pay

## 2020-06-26 ENCOUNTER — Encounter: Payer: Self-pay | Admitting: Cardiovascular Disease

## 2020-06-26 VITALS — BP 130/84 | HR 80 | Ht 61.0 in | Wt 91.0 lb

## 2020-06-26 DIAGNOSIS — I10 Essential (primary) hypertension: Secondary | ICD-10-CM | POA: Diagnosis not present

## 2020-06-26 DIAGNOSIS — K219 Gastro-esophageal reflux disease without esophagitis: Secondary | ICD-10-CM

## 2020-06-26 DIAGNOSIS — I2510 Atherosclerotic heart disease of native coronary artery without angina pectoris: Secondary | ICD-10-CM | POA: Diagnosis not present

## 2020-06-26 DIAGNOSIS — R0789 Other chest pain: Secondary | ICD-10-CM

## 2020-06-26 DIAGNOSIS — Z049 Encounter for examination and observation for unspecified reason: Secondary | ICD-10-CM | POA: Diagnosis not present

## 2020-06-26 NOTE — Patient Instructions (Signed)

## 2020-07-01 ENCOUNTER — Other Ambulatory Visit: Payer: Self-pay

## 2020-07-01 ENCOUNTER — Telehealth: Payer: Medicare Other

## 2020-07-01 ENCOUNTER — Ambulatory Visit: Payer: Self-pay

## 2020-07-01 ENCOUNTER — Other Ambulatory Visit: Payer: Self-pay | Admitting: Nurse Practitioner

## 2020-07-01 DIAGNOSIS — E559 Vitamin D deficiency, unspecified: Secondary | ICD-10-CM

## 2020-07-01 DIAGNOSIS — M0579 Rheumatoid arthritis with rheumatoid factor of multiple sites without organ or systems involvement: Secondary | ICD-10-CM

## 2020-07-01 DIAGNOSIS — I10 Essential (primary) hypertension: Secondary | ICD-10-CM

## 2020-07-01 DIAGNOSIS — E039 Hypothyroidism, unspecified: Secondary | ICD-10-CM

## 2020-07-01 DIAGNOSIS — F0280 Dementia in other diseases classified elsewhere without behavioral disturbance: Secondary | ICD-10-CM

## 2020-07-06 ENCOUNTER — Other Ambulatory Visit: Payer: Self-pay | Admitting: Nurse Practitioner

## 2020-07-06 ENCOUNTER — Other Ambulatory Visit: Payer: Self-pay | Admitting: Rheumatology

## 2020-07-06 NOTE — Telephone Encounter (Signed)
Last Visit:05/07/2020 Next Visit:08/06/2020 Labs:06/11/2020 MCV, MCH, MCHC are low but improving. Hemoglobin and hematocrit are WNL. CMP WNL.  TB Gold: 06/11/2020 Neg   Current Dose per office note10/14/2021:Enbrel Sureclick 50 mg every 7 days IR:[id number] arthritis involving multiple sites with positive rheumatoid factor  Okay to refill Enbrel?

## 2020-07-07 NOTE — Patient Instructions (Signed)
Visit Information  Goals Addressed    . Care for the Caregiver-Dementia       Timeframe:  Long-Range Goal Priority:  High Start Date: 07/01/20                            Expected End Date: 09/30/31                      Follow Up Date 08/14/20    - make a list of family or friends that I can call  - Assist patient with taking all medications as prescribed for this condition - keep all MD appointments  - notify MD of new or worsening symptoms promptly   Why is this important?    People who care for a person with dementia often feel stress.   Too much stress can be harmful to both of you.   You may feel depressed, exhausted, have trouble sleeping or have health problems.   You need to take care of yourself and reach out for help when you need it.   There are some things that can be done to keep the quality of your life satisfying.     Notes:     . Evaluate and treat Vitamin D deficiency       Timeframe:  Long-Range Goal Priority:  High Start Date: 07/01/20                            Expected End Date: 09/29/20  - follow up with PCP as scheduled for Vitamin d blood test - take your Vitamin D supplement as directed by PCP - Eat Vitamin rich foods including,  - fatty fish - egg yolks - Fortified cereals - milk and juices with added vitamin D - yogurt - beef liver - Spend at least 15 minutes each day in the sun light or as much as possible                         . Follow My Treatment Plan-Chronic Kidney       Timeframe:  Long-Range Goal Priority:  High Start Date: 07/01/20                            Expected End Date: 09/29/20                      Follow Up Date 08/14/20   - call the doctor or nurse before I stop taking medicine - keep follow-up appointments - set alarm to remind to take medications    Why is this important?    Staying as healthy as you can is very important. This may mean making changes if you smoke, don't exercise or eat poorly.   A healthy lifestyle  is an important goal for you.   Following the treatment plan and making changes may be hard.   Try some of these steps to help keep the disease from getting worse.     Notes:     [name] Manage My Medicine       Timeframe:  Long-Range Goal Priority:  High Start Date: 07/01/20                           Expected End Date: 09/29/20  Follow Up Date 08/14/20   - call for medicine refill 2 or 3 days before it runs out - keep a list of all the medicines I take; vitamins and herbals too - use a pillbox to sort medicine - use an alarm clock or phone to remind me to take my medicine  - take Levothyroxine (Synthroid) exactly as directed by PCP (take Synthroid first thing in the morning with a sip of water and wait 30 minutes before taking other medications and or eating/drinking; Wait at least 4 hours before taking Iron supplements and multivitamins with iron Calcium supplements or multivitamins with calcium or Antacids - keep lab appointment with PCP for TSH recheck (07/21/20 @ 9:30 am)   Why is this important?   . These steps will help you keep on track with your medicines.   Notes:     . Track and Manage My Blood Pressure-Hypertension       Timeframe:  Long-Range Goal Priority:  High Start Date: 07/01/20                            Expected End Date: 09/29/20                      Follow Up Date 08/14/20   - check blood pressure 3 times per week - write blood pressure results in a log or diary    Why is this important?    You won't feel high blood pressure, but it can still hurt your blood vessels.   High blood pressure can cause heart or kidney problems. It can also cause a stroke.   Making lifestyle changes like losing a Ressie Slevin weight or eating less salt will help.   Checking your blood pressure at home and at different times of the day can help to control blood pressure.   If the doctor prescribes medicine remember to take it the way the doctor ordered.   Call the  office if you cannot afford the medicine or if there are questions about it.     Notes:        The patient verbalized understanding of instructions, educational materials, and care plan provided today and declined offer to receive copy of patient instructions, educational materials, and care plan.   Telephone follow up appointment with care management team member scheduled for: 08/14/20  Riley Churches, RN

## 2020-07-07 NOTE — Chronic Care Management (AMB) (Signed)
Chronic Care Management   Follow Up Note   07/01/2020 Name: Jordan [name] MRN: [id number] DOB: 02-Jan-1934  Referred by: [name], FNP Reason for referral : Chronic Care Management (RNCM FU Call )   Jordan [name] is a 86 y.o. year old female who is a primary care patient of [name], [place]. The CCM team was consulted for assistance with chronic disease management and care coordination needs.    Review of patient status, including review of consultants reports, relevant laboratory and other test results, and collaboration with appropriate care team members and the patient's provider was performed as part of comprehensive patient evaluation and provision of chronic care management services.    SDOH (Social Determinants of Health) assessments performed: Yes See Care Plan activities for detailed interventions related to [place])    Completed CCM RN CM outbound call to patient's daughter Jordan [name] for a care plan update.   Outpatient Encounter Medications as of 07/01/2020  Medication Sig  . albuterol (VENTOLIN HFA) 108 (90 Base) MCG/ACT inhaler Inhale 1 puff into the lungs every 6 (six) hours as needed for wheezing or shortness of breath.  . ALLERGY RELIEF 10 MG tablet TAKE 1 TABLET BY MOUTH EVERY DAY (Patient taking differently: Take 10 mg by mouth daily. )  . amLODipine (NORVASC) 5 MG tablet TAKE 1 TABLET BY MOUTH EVERY DAY  . aspirin EC 81 MG tablet Take 81 mg by mouth daily. Swallow whole.  . brimonidine-timolol (COMBIGAN) 0.2-0.5 % ophthalmic solution Place 1 drop into both eyes every 12 (twelve) hours.  . feeding supplement, ENSURE ENLIVE, (ENSURE ENLIVE) LIQD Take 237 mLs by mouth daily in the afternoon.  . ferrous sulfate 325 (65 FE) MG tablet Take 325 mg by mouth daily with breakfast.  . fluticasone (FLONASE) 50 MCG/ACT nasal spray INSTILL 2 SPRAYS IN EACH NOSTRIL EVERY DAY (Patient taking differently: Place 2 sprays into both nostrils daily as needed for allergies. )  . folic  acid (FOLVITE) 1 MG tablet TAKE 1 TABLET(1 MG) BY MOUTH DAILY (Patient taking differently: Take 1 mg by mouth daily. )  . latanoprost (XALATAN) 0.005 % ophthalmic solution Place 1 drop into both eyes at bedtime.   [name] levothyroxine (SYNTHROID) 50 MCG tablet Take 1 tablet (50 mcg total) by mouth daily before breakfast.  . NAMZARIC 28-10 MG CP24 Take 1 capsule by mouth daily.  [name] omeprazole (PRILOSEC) 20 MG capsule Take 1 capsule (20 mg total) by mouth daily. TAKE 1 CAPSULE BY MOUTH EVERY DAY  . QUEtiapine (SEROQUEL) 25 MG tablet Take 1 tablet (25 mg total) by mouth at bedtime.  . simvastatin (ZOCOR) 40 MG tablet Take 1 tablet (40 mg total) by mouth at bedtime.  . [DISCONTINUED] ENBREL SURECLICK 50 MG/ML injection Inject 0.98 mLs (50 mg total) into the skin once a week.  . [DISCONTINUED] loratadine (CLARITIN) 10 MG tablet Take 1 tablet (10 mg total) by mouth daily.   No facility-administered encounter medications on file as of 07/01/2020.     Objective:  No results found for: HGBA1C Lab Results  Component Value Date   MICROALBUR 30 02/21/2019   LDLCALC 86 03/26/2020   CREATININE 0.72 06/11/2020   BP Readings from Last 3 Encounters:  06/26/20 130/84  06/16/20 116/68  05/14/20 114/68    Goals Addressed     Patient Care Plan: Chronic Kidney (Adult)    Problem Identified: Disease Progression   Priority: High    Long-Range Goal: Disease Progression Prevented or Minimized   Start Date: 07/01/2020  Expected  End Date: 09/29/2020  This Visit's Progress: On track  Note:   Current Barriers:   Ineffective Self Health Maintenance  Unable to self administer medications as prescribed  Unable to perform IADLs independently  Currently UNABLE TO independently self manage needs related to chronic health conditions.   Knowledge Deficits related to short term plan for care coordination needs and long term plans for chronic disease management needs Nurse Case Manager Clinical Goal(s):   Over the  next 90 days, patient will work with care management team to address care coordination and chronic disease management needs related to Disease Management  Educational Needs  Care Coordination  Medication Management and Education  Caregiver Stress support  Dementia and Caregiver Support   Interventions:   Discuss potential for disease progression based on clinical diagnosis and/or causation and other risk factors including race, ethnicity, age, comorbidities, lifestyle.   Encourage lifestyle changes including maintaining a healthy weight, exercise and activity, limiting alcohol consumption, smoking cessation to improve long-term outcomes.   Monitor for nephrotoxic medication or supplement use including NSAIDs (nonsteroidal anti-inflammatory drugs), radio-contrast media, oral phosphate-containing bowel preparations, herbal supplements, protein supplements. Patient Goals/Self Care Activities:  - call the doctor or nurse before I stop taking medicine - keep follow-up appointments - set alarm to remind to take medications Follow Up Plan: Telephone follow up appointment with care management team member scheduled for: 08/14/20   Patient Care Plan: Dementia (Adult)    Problem Identified: Caregiver Stress   Priority: High    Long-Range Goal: Caregiver Coping Optimized   Start Date: 07/01/2020  Expected End Date: 09/29/2020  This Visit's Progress: On track  Priority: High  Note:   Current Barriers:   Ineffective Self Health Maintenance  Unable to self administer medications as prescribed  Unable to perform IADLs independently  Currently UNABLE TO independently self manage needs related to chronic health conditions.   Knowledge Deficits related to short term plan for care coordination needs and long term plans for chronic disease management needs Nurse Case Manager Clinical Goal(s):   Over the next 90 days, patient will work with care management team to address care coordination and  chronic disease management needs related to Disease Management  Educational Needs  Care Coordination  Medication Management and Education  Caregiver Stress support  Dementia and Caregiver Support   Interventions:   Recognize and validate the complex nature of dementia, as well as the impact on the caregiver and extended family; provide education and support to align with needs and stage of dementia.   Acknowledge feelings of grief associated with dementia such as loss of relationship, recreational activities, future with patient and loss associated with out-of-home placement.   Encourage verbalization of feelings without ruminating.  Patient Goals/Self Care Activities: - make a list of family or friends that I can call  - Assist patient with taking all medications as prescribed for this condition - keep all MD appointments  - notify MD of new or worsening symptoms promptly  Follow Up Plan: Telephone follow up appointment with care management team member scheduled for: 08/14/20   Problem Identified: Cognitive Function   Priority: High    Long-Range Goal: Optimal Cognitive Function   Start Date: 07/01/2020  Expected End Date: 09/29/2020  This Visit's Progress: On track  Priority: High  Note:   Current Barriers:   Ineffective Self Health Maintenance  Unable to self administer medications as prescribed  Unable to perform IADLs independently  Currently UNABLE TO independently self manage needs related to  chronic health conditions.   Knowledge Deficits related to short term plan for care coordination needs and long term plans for chronic disease management needs Nurse Case Manager Clinical Goal(s):   Over the next 90 days, patient will work with care management team to address care coordination and chronic disease management needs related to Disease Management  Educational Needs  Care Coordination  Medication Management and Education  Psychosocial Support  Dementia and  Caregiver Support   Interventions:  Encourage social relationships and physical activity based on ability.  Promote strategies that will preserve the individual's abilities; focus on strengths and quality of life optimization.  Patient Goals/Self Care Activities: - make a list of family or friends that I can call  - take all medications as prescribed for this condition - keep all MD appointments  - notify MD of new or worsening symptoms promptly Follow Up Plan: Telephone follow up appointment with care management team member scheduled for: 08/14/20   Patient Care Plan: Hypertension (Adult)    Problem Identified: Disease Progression (Hypertension)   Priority: High    Long-Range Goal: Disease Progression Prevented or Minimized   Start Date: 07/01/2020  Expected End Date: 09/29/2020  This Visit's Progress: On track  Priority: High  Note:   Objective:  . Last practice recorded BP readings:  BP Readings from Last 3 Encounters:  06/26/20 130/84  06/16/20 116/68  05/14/20 114/68 .   [name] Most recent eGFR/CrCl: No results found for: EGFR  No components found for: CRCL Current Barriers:  [name] Knowledge Deficits related to basic understanding of hypertension pathophysiology and self care management . Knowledge Deficits related to understanding of medications prescribed for management of hypertension . Cognitive Deficits . Unable to self administer medications as prescribed . Unable to perform IADLs independently Case Manager Clinical Goal(s):  [name] Over the next 90 days, patient will demonstrate improved adherence to prescribed treatment plan for hypertension as evidenced by taking all medications as prescribed, monitoring and recording blood pressure as directed, adhering to low sodium/DASH diet Interventions:  . Evaluation of current treatment plan related to hypertension self management and patient's adherence to plan as established by provider. . Provided education to patient re: stroke  prevention, s/s of heart attack and stroke, DASH diet, complications of uncontrolled blood pressure . Reviewed medications with patient and discussed importance of compliance . Discussed plans with patient for ongoing care management follow up and provided patient with direct contact information for care management team . Encourage the avoidance of no more than 2 hours per day of sedentary activity, such as recreational screen time.  . Consider moderate reduction in sodium intake by avoiding the addition of salt to prepared foods and limiting processed meats, canned soup, frozen meals and salty snacks.   Patient Goals/Self-Care Activities . Over the next 90 days, patient will:  - Attends all scheduled provider appointments Calls provider office for new concerns, questions, or BP outside discussed parameters Checks BP and records as discussed Follows a low sodium diet/DASH diet Follow Up Plan: Telephone follow up appointment with care management team member scheduled for:  08/14/20    Patient Care Plan: Wellness (Adult)    Problem Identified: Medication Adherence (Wellness)   Priority: High    Long-Range Goal: Medication Adherence Maintained   Start Date: 07/01/2020  Expected End Date: 09/29/2020  This Visit's Progress: On track  Priority: High  Note:   Current Barriers:   Ineffective Self Health Maintenance  Unable to self administer medications as prescribed  Unable  to perform IADLs independently  Currently UNABLE TO independently self manage needs related to chronic health conditions.   Knowledge Deficits related to short term plan for care coordination needs and long term plans for chronic disease management needs Nurse Case Manager Clinical Goal(s):   Over the next 90 days, patient will work with care management team to address care coordination and chronic disease management needs related to Disease Management  Educational Needs  Care Coordination  Medication Management  and Education  Psychosocial Support  Dementia and Caregiver Support   Interventions:   Review all medications to determine if patient or caregiver knows why the medications are given and if taken as prescribed.   Complete or review a medication adherence assessment including barriers to medication adherence.   Assess barriers to medication adherence.   Encourage the use of medication reminders such as clock or cell phone alarm, color coding, pillboxes for am/pm and days of the week, pharmacy refill reminder, auto-refill system or mail-order option. Patient Goals/Self Care Activities:  - call for medicine refill 2 or 3 days before it runs out - keep a list of all the medicines I take; vitamins and herbals too - use a pillbox to sort medicine - use an alarm clock or phone to remind me to take my medicine - take Levothyroxine (Synthroid) exactly as directed by PCP (take Synthroid first thing in the morning with a sip of water and wait 30 minutes before taking other medications and or eating/drinking; Wait at least 4 hours before taking Iron supplements and multivitamins with iron Calcium supplements or multivitamins with calcium or Antacids - keep lab appointment with PCP for TSH recheck (07/21/20 @ 9:30 am)  Follow Up Plan: Telephone follow up appointment with care management team member scheduled for: 08/14/20    Plan:   Telephone follow up appointment with care management team member scheduled for: 08/14/20  Barb Merino, RN, BSN, CCM Care Management Coordinator [facility]  Direct Phone: [phone]

## 2020-07-14 DIAGNOSIS — Z1231 Encounter for screening mammogram for malignant neoplasm of breast: Secondary | ICD-10-CM | POA: Diagnosis not present

## 2020-07-14 LAB — HM MAMMOGRAPHY

## 2020-07-15 ENCOUNTER — Other Ambulatory Visit: Payer: Self-pay

## 2020-07-15 MED ORDER — QUETIAPINE FUMARATE 50 MG PO TABS: 50.0000 mg | ORAL_TABLET | Freq: Every day | ORAL | 1 refills | Status: DC

## 2020-07-21 ENCOUNTER — Other Ambulatory Visit: Payer: Self-pay

## 2020-07-21 ENCOUNTER — Other Ambulatory Visit: Payer: Medicare Other

## 2020-07-21 DIAGNOSIS — E559 Vitamin D deficiency, unspecified: Secondary | ICD-10-CM

## 2020-07-21 DIAGNOSIS — E039 Hypothyroidism, unspecified: Secondary | ICD-10-CM | POA: Diagnosis not present

## 2020-07-22 ENCOUNTER — Encounter: Payer: Self-pay | Admitting: Nurse Practitioner

## 2020-07-22 LAB — VITAMIN D 25 HYDROXY (VIT D DEFICIENCY, FRACTURES): Vit D, 25-Hydroxy: 9.6 ng/mL — ABNORMAL LOW (ref 30.0–100.0)

## 2020-07-23 NOTE — Progress Notes (Signed)
Office Visit Note  Patient: Jordan [name]             Date of Birth: 03-25-1934           MRN: [id number]             PCP: Arnette Felts, FNP Referring: Arnette Felts, FNP Visit Date: 08/06/2020 Occupation: @  Subjective:  Medication monitoring   History of Present Illness: Jordan [name] is a 86 y.o. female with history of seropositive rheumatoid arthritis, DDD, and osteoporosis.  Patient is on Enbrel 50 mg sq injections once weekly.  She has not missed any doses of enbrel recently.  She denies any recent rheumatoid arthritis flares.  She states she has occasional discomfort in the left wrist joint which she attributes to using a cane to assist with ambulation as well as supporting her head while napping during the day.  She denies any joint swelling.  She denies any recent falls or fractures.  She denies any recent infections.  She will be receiving her covid-19 booster vaccine next week.   Activities of Daily Living:  Patient reports morning stiffness for 0 minutes.   Patient Denies nocturnal pain.  Difficulty dressing/grooming: Denies Difficulty climbing stairs: Reports Difficulty getting out of chair: Reports Difficulty using hands for taps, buttons, cutlery, and/or writing: Reports  Review of Systems  Constitutional: Positive for fatigue.  HENT: Negative for mouth sores, mouth dryness and nose dryness.   Eyes: Positive for visual disturbance. Negative for pain, itching and dryness.  Respiratory: Negative for cough, hemoptysis, shortness of breath and difficulty breathing.   Cardiovascular: Negative for chest pain, palpitations and swelling in legs/feet.  Gastrointestinal: Negative for abdominal pain, blood in stool, constipation and diarrhea.  Endocrine: Negative for increased urination.  Genitourinary: Negative for painful urination.  Musculoskeletal: Negative for arthralgias, joint pain, joint swelling, myalgias, muscle weakness, morning stiffness, muscle  tenderness and myalgias.  Skin: Negative for color change, rash and redness.  Allergic/Immunologic: Negative for susceptible to infections.  Neurological: Positive for memory loss and weakness. Negative for dizziness, numbness and headaches.  Hematological: Negative for swollen glands.  Psychiatric/Behavioral: Positive for confusion and sleep disturbance.    PMFS History:  Patient Active Problem List   Diagnosis Date Noted  . Chest pain   . Hypotension 03/24/2020  . Alzheimer's disease (HCC) 08/13/2019  . Essential hypertension 08/13/2018  . Acquired hypothyroidism 08/13/2018  . Rash and nonspecific skin eruption 08/13/2018  . Memory loss 04/12/2017  . Depression 04/12/2017  . Rheumatoid arthritis involving multiple sites with positive rheumatoid factor (HCC) 08/17/2016  . High risk medication use 08/17/2016  . H/O total knee replacement, bilateral 08/17/2016  . Spondylosis of lumbar region without myelopathy or radiculopathy 08/17/2016  . History of glaucoma/ patient is legally blind 08/17/2016  . History of gastroesophageal reflux (GERD) 08/17/2016  . History of diverticulosis 08/17/2016  . History of hypothyroidism 08/17/2016  . history of asthmatic bronchitis 08/17/2016  . History of memory loss 08/17/2016  . History of atherosclerosis 08/17/2016    Past Medical History:  Diagnosis Date  . Allergy   . Anxiety    per pt's daughter  . Arthritis   . Asthma    pt reported  . Depression   . GERD (gastroesophageal reflux disease)   . Hearing deficit L  . High cholesterol   . Hypertension   . Osteoporosis   . Rheumatoid arthritis (HCC)   . Stroke [facility])     History reviewed. No pertinent family history. Past  Surgical History:  Procedure Laterality Date  . ABDOMINAL HYSTERECTOMY    . KNEE ARTHROPLASTY    . REPLACEMENT TOTAL KNEE BILATERAL Bilateral   . WRIST SURGERY Left    Social History   Social History Narrative   Lives at home with grandson.   Right-handed.    2-3 cups coffee per day.   Immunization History  Administered Date(s) Administered  . Fluad Quad(high Dose 65+) 04/06/2020  . Moderna Sars-Covid-2 Vaccination 09/26/2019, 10/24/2019     Objective: Vital Signs: BP 123/80 (BP Location: Left Arm, Patient Position: Sitting, Cuff Size: Normal)   Pulse 76   Resp 13   Ht 5\' 1"  (1.549 m)   Wt 94 lb 11.2 oz (43 kg)   BMI 17.89 kg/m    Physical Exam Vitals and nursing note reviewed.  Constitutional:      Appearance: She is well-developed and well-nourished.  HENT:     Head: Normocephalic and atraumatic.  Eyes:     Extraocular Movements: EOM normal.     Conjunctiva/sclera: Conjunctivae normal.  Cardiovascular:     Pulses: Intact distal pulses.  Pulmonary:     Effort: Pulmonary effort is normal.  Abdominal:     Palpations: Abdomen is soft.  Musculoskeletal:     Cervical back: Normal range of motion.  Skin:    General: Skin is warm and dry.     Capillary Refill: Capillary refill takes less than 2 seconds.  Neurological:     Mental Status: She is alert and oriented to person, place, and time.  Psychiatric:        Mood and Affect: Mood and affect normal.        Behavior: Behavior normal.      Musculoskeletal Exam: C-spine good ROM with no discomfort.  Postural thoracic kyphosis noted.  No midline spinal tenderness.  Shoulder joints, elbow joints, and wrist joints have good ROM with no discomfort.  Tenderness on the ulnar aspect of the left wrist and left 4th MCP joint.  Complete fist formation bilaterally. Bilateral knee replacements have good ROM with no discomfort.  Ankle joints good ROM with no tenderness or inflammation.   CDAI Exam: CDAI Score: 2.4  Patient Global: 2 mm; Provider Global: 2 mm Swollen: 0 ; Tender: 2  Joint Exam 08/06/2020      Right  Left  Wrist      Tender  MCP 4      Tender     Investigation: No additional findings.  Imaging: No results found.  Recent Labs: Lab Results  Component Value  Date   WBC 7.9 06/11/2020   HGB 11.7 06/11/2020   PLT 202 06/11/2020   NA 137 06/11/2020   K 4.2 06/11/2020   CL 101 06/11/2020   CO2 27 06/11/2020   GLUCOSE 75 06/11/2020   BUN 20 06/11/2020   CREATININE 0.72 06/11/2020   BILITOT 0.4 06/11/2020   ALKPHOS 54 03/24/2020   AST 13 06/11/2020   ALT 7 06/11/2020   PROT 6.6 06/11/2020   ALBUMIN 3.8 03/24/2020   CALCIUM 9.4 06/11/2020   GFRAA 89 06/11/2020   QFTBGOLDPLUS NEGATIVE 06/11/2020    Speciality Comments: No specialty comments available.  Procedures:  No procedures performed Allergies: Penicillins   Assessment / Plan:     Visit Diagnoses: Rheumatoid arthritis involving multiple sites with positive rheumatoid factor (HCC): She has no synovitis on exam.  She has not had any recent rheumatoid arthritis flares.  She is clinically doing well on enbrel 50 mg sq injections  once weekly.  She has not missed any doses recently.  She has some tenderness on the ulnar aspect of the left wrist and left 4th MCP joint but no synovitis was noted.  She was encouraged to wear arthritis compression gloves and to avoid napping with her head being supported by her left hand/wrist. She will continue on enbrel 50 mg sq injections once weekly.  She does not need a refill at this time. She was advised to notify us if she develops increased joint pain or joint swelling. She will follow up in 5 months.   High risk medication use - Enbrel Sureclick 50 mg sq injections every 7 days. CBC and CMP were updated on 06/11/2020.  She will be due to update lab work in February and every 3 months to monitor for drug toxicity.  Standing orders for CBC and CMP remain in place.  TB Gold negative on 06/11/2020 and will continue to be monitored yearly. She has received 2 moderna vaccinations. She is planning to receive her booster dose next week. She has not had any recent infections. Discussed the importance of holding enbrel if she develops signs or symptoms of an  infection and to resume once the infection has completely cleared.   Eye pain, right: Ongoing. She has been following up with her ophthalmologist closely.   H/O total knee replacement, bilateral: Doing well.  She has good ROM with no discomfort.   DDD (degenerative disc disease), lumbar: No midline spinal tenderness.  She is not having any increased discomfort in her lower back at this time.   Age-related osteoporosis without current pathological fracture - Scan on 06/19/2017 showed T-score of -3 at left femur neck and no baseline for comparison of BMD change. She is due to update DEXA. Order for DEXA placed today. She has not had any recent falls or fractures. She walks with a cane to assist with ambulation.   Other medical conditions are listed as follows:   History of glaucoma/ patient is legally blind  History of diverticulosis  History of memory loss  History of atherosclerosis  history of asthmatic bronchitis  History of hypothyroidism  History of gastroesophageal reflux (GERD)  Essential hypertension  Orders: No orders of the defined types were placed in this encounter.  No orders of the defined types were placed in this encounter.     Follow-Up Instructions: Return in about 5 months (around 01/04/2021) for Rheumatoid arthritis, Osteoporosis, DDD.   Gearldine Bienenstock, PA-C  Note - This record has been created using Dragon software.  Chart creation errors have been sought, but may not always  have been located. Such creation errors do not reflect on  the standard of medical care.

## 2020-07-24 LAB — T4 AND TSH
T4, Total: 9.7 ug/dL (ref 4.5–12.0)
TSH: 1.25 u[IU]/mL (ref 0.450–4.500)

## 2020-07-24 LAB — T3, FREE: T3, Free: 2.2 pg/mL (ref 2.0–4.4)

## 2020-07-24 LAB — SPECIMEN STATUS REPORT

## 2020-07-28 DIAGNOSIS — Z049 Encounter for examination and observation for unspecified reason: Secondary | ICD-10-CM | POA: Diagnosis not present

## 2020-07-29 ENCOUNTER — Other Ambulatory Visit: Payer: Self-pay | Admitting: Nurse Practitioner

## 2020-07-29 DIAGNOSIS — I10 Essential (primary) hypertension: Secondary | ICD-10-CM

## 2020-08-04 ENCOUNTER — Other Ambulatory Visit: Payer: Self-pay | Admitting: Nurse Practitioner

## 2020-08-06 ENCOUNTER — Other Ambulatory Visit: Payer: Self-pay

## 2020-08-06 ENCOUNTER — Encounter: Payer: Self-pay | Admitting: Physician Assistant

## 2020-08-06 ENCOUNTER — Ambulatory Visit (INDEPENDENT_AMBULATORY_CARE_PROVIDER_SITE_OTHER): Payer: Medicare Other | Admitting: Physician Assistant

## 2020-08-06 ENCOUNTER — Other Ambulatory Visit: Payer: Self-pay | Admitting: Nurse Practitioner

## 2020-08-06 VITALS — BP 123/80 | HR 76 | Resp 13 | Ht 61.0 in | Wt 94.7 lb

## 2020-08-06 DIAGNOSIS — H5711 Ocular pain, right eye: Secondary | ICD-10-CM | POA: Diagnosis not present

## 2020-08-06 DIAGNOSIS — M5136 Other intervertebral disc degeneration, lumbar region: Secondary | ICD-10-CM

## 2020-08-06 DIAGNOSIS — M0579 Rheumatoid arthritis with rheumatoid factor of multiple sites without organ or systems involvement: Secondary | ICD-10-CM | POA: Diagnosis not present

## 2020-08-06 DIAGNOSIS — Z8639 Personal history of other endocrine, nutritional and metabolic disease: Secondary | ICD-10-CM

## 2020-08-06 DIAGNOSIS — Z8709 Personal history of other diseases of the respiratory system: Secondary | ICD-10-CM | POA: Diagnosis not present

## 2020-08-06 DIAGNOSIS — Z8669 Personal history of other diseases of the nervous system and sense organs: Secondary | ICD-10-CM | POA: Diagnosis not present

## 2020-08-06 DIAGNOSIS — E559 Vitamin D deficiency, unspecified: Secondary | ICD-10-CM

## 2020-08-06 DIAGNOSIS — Z87898 Personal history of other specified conditions: Secondary | ICD-10-CM

## 2020-08-06 DIAGNOSIS — Z8719 Personal history of other diseases of the digestive system: Secondary | ICD-10-CM

## 2020-08-06 DIAGNOSIS — M810 Age-related osteoporosis without current pathological fracture: Secondary | ICD-10-CM | POA: Diagnosis not present

## 2020-08-06 DIAGNOSIS — Z8679 Personal history of other diseases of the circulatory system: Secondary | ICD-10-CM | POA: Diagnosis not present

## 2020-08-06 DIAGNOSIS — Z96653 Presence of artificial knee joint, bilateral: Secondary | ICD-10-CM

## 2020-08-06 DIAGNOSIS — I10 Essential (primary) hypertension: Secondary | ICD-10-CM

## 2020-08-06 DIAGNOSIS — Z79899 Other long term (current) drug therapy: Secondary | ICD-10-CM | POA: Diagnosis not present

## 2020-08-06 MED ORDER — VITAMIN D (ERGOCALCIFEROL) 1.25 MG (50000 UNIT) PO CAPS: 50000.0000 [IU] | ORAL_CAPSULE | ORAL | 1 refills | Status: DC

## 2020-08-06 NOTE — Patient Instructions (Signed)
Standing Labs We placed an order today for your standing lab work.   Please have your standing labs drawn in February and every 3 months    If possible, please have your labs drawn 2 weeks prior to your appointment so that the provider can discuss your results at your appointment.  We have open lab daily Monday through Thursday from 8:30-12:30 PM and 1:30-4:30 PM and Friday from 8:30-12:30 PM and 1:30-4:00 PM at the office of Dr. Shaili Deveshwar, [facility].   Please be advised, patients with office appointments requiring lab work will take precedents over walk-in lab work.  If possible, please come for your lab work on Monday and Friday afternoons, as you may experience shorter wait times. The office is located at [address], [address], [place], [place] [address] No appointment is necessary.   Labs are drawn by [facility]. Please bring your co-pay at the time of your lab draw.  You may receive a bill from [facility] for your lab work.  If you wish to have your labs drawn at another location, please call the office 24 hours in advance to send orders.  If you have any questions regarding directions or hours of operation,  please call [phone].   As a reminder, please drink plenty of water prior to coming for your lab work. Thanks!   

## 2020-08-14 ENCOUNTER — Ambulatory Visit: Payer: Self-pay

## 2020-08-14 ENCOUNTER — Telehealth: Payer: Medicare Other

## 2020-08-17 ENCOUNTER — Other Ambulatory Visit: Payer: Self-pay | Admitting: Nurse Practitioner

## 2020-08-17 DIAGNOSIS — I10 Essential (primary) hypertension: Secondary | ICD-10-CM

## 2020-08-18 ENCOUNTER — Ambulatory Visit: Payer: Self-pay | Admitting: Nurse Practitioner

## 2020-08-18 ENCOUNTER — Ambulatory Visit: Payer: Medicare Other

## 2020-08-26 ENCOUNTER — Ambulatory Visit: Payer: Medicare Other

## 2020-08-26 DIAGNOSIS — I10 Essential (primary) hypertension: Secondary | ICD-10-CM

## 2020-08-26 DIAGNOSIS — F0280 Dementia in other diseases classified elsewhere without behavioral disturbance: Secondary | ICD-10-CM

## 2020-08-26 NOTE — Chronic Care Management (AMB) (Signed)
Chronic Care Management    Social Work Note  08/26/2020 Name: Jordan [name] MRN: [id number] DOB: April 25, 1934  Jordan [name] is a 87 y.o. year old female who is a primary care patient of Arnette Felts, FNP. The CCM team was consulted to assist the patient with chronic disease management and/or care coordination needs related to: [employer] .   Enaged with patient daughter/caregiver by phone for follow up visit in response to provider referral for social work chronic care management and care coordination services.   Consent to Services:  The patient was given information about Chronic Care Management services, agreed to services, and gave verbal consent prior to initiation of services.  Please see initial visit note for detailed documentation.   Patient agreed to services and consent obtained.   Assessment: Review of patient past medical history, allergies, medications, and health status, including review of relevant consultants reports was performed today as part of a comprehensive evaluation and provision of chronic care management and care coordination services.     SDOH (Social Determinants of Health) assessments and interventions performed:    Advanced Directives Status: Not addressed in this encounter.  CCM Care Plan  Allergies  Allergen Reactions  . Penicillins Hives    Outpatient Encounter Medications as of 08/26/2020  Medication Sig  . albuterol (VENTOLIN HFA) 108 (90 Base) MCG/ACT inhaler Inhale 1 puff into the lungs every 6 (six) hours as needed for wheezing or shortness of breath.  . ALLERGY RELIEF 10 MG tablet TAKE 1 TABLET BY MOUTH EVERY DAY (Patient taking differently: Take 10 mg by mouth daily.)  . ALLERGY RELIEF 10 MG tablet TAKE 1 TABLET BY MOUTH EVERY DAY  . amLODipine (NORVASC) 5 MG tablet TAKE 1 TABLET BY MOUTH EVERY DAY  . aspirin EC 81 MG tablet Take 81 mg by mouth daily. Swallow whole.  . brimonidine-timolol (COMBIGAN) 0.2-0.5 % ophthalmic solution  Place 1 drop into both eyes every 12 (twelve) hours. (Patient not taking: Reported on 08/06/2020)  . ENBREL SURECLICK 50 MG/ML injection Inject 0.98 mLs (50 mg total) into the skin once a week.  . feeding supplement, ENSURE ENLIVE, (ENSURE ENLIVE) LIQD Take 237 mLs by mouth daily in the afternoon.  . ferrous sulfate 325 (65 FE) MG tablet Take 325 mg by mouth daily with breakfast.  . fluticasone (FLONASE) 50 MCG/ACT nasal spray INSTILL 2 SPRAYS IN EACH NOSTRIL EVERY DAY (Patient taking differently: Place 2 sprays into both nostrils daily as needed for allergies.)  . folic acid (FOLVITE) 1 MG tablet TAKE 1 TABLET(1 MG) BY MOUTH DAILY (Patient taking differently: Take 1 mg by mouth daily.)  . latanoprost (XALATAN) 0.005 % ophthalmic solution Place 1 drop into both eyes at bedtime.   [name] levothyroxine (SYNTHROID) 50 MCG tablet Take 1 tablet (50 mcg total) by mouth daily before breakfast.  . NAMZARIC 28-10 MG CP24 Take 1 capsule by mouth daily.  [name] omeprazole (PRILOSEC) 20 MG capsule Take 1 capsule (20 mg total) by mouth daily. TAKE 1 CAPSULE BY MOUTH EVERY DAY  . QUEtiapine (SEROQUEL) 50 MG tablet TAKE 1 TABLET BY MOUTH AT BEDTIME  . simvastatin (ZOCOR) 40 MG tablet Take 1 tablet (40 mg total) by mouth at bedtime.  [name] SYNTHROID 75 MCG tablet TAKE 1 TABLET BY MOUTH EVERY DAY BEFORE BREAKFAST  . timolol (TIMOPTIC) 0.5 % ophthalmic solution 1 drop 2 (two) times daily.  . Vitamin D, Ergocalciferol, (DRISDOL) 1.25 MG (50000 UNIT) CAPS capsule Take 1 capsule (50,000 Units total) by mouth 2 (two)  times a week.   No facility-administered encounter medications on file as of 08/26/2020.    Patient Active Problem List   Diagnosis Date Noted  . Chest pain   . Hypotension 03/24/2020  . Alzheimer's disease (HCC) 08/13/2019  . Essential hypertension 08/13/2018  . Acquired hypothyroidism 08/13/2018  . Rash and nonspecific skin eruption 08/13/2018  . Memory loss 04/12/2017  . Depression 04/12/2017  . Rheumatoid  arthritis involving multiple sites with positive rheumatoid factor (HCC) 08/17/2016  . High risk medication use 08/17/2016  . H/O total knee replacement, bilateral 08/17/2016  . Spondylosis of lumbar region without myelopathy or radiculopathy 08/17/2016  . History of glaucoma/ patient is legally blind 08/17/2016  . History of gastroesophageal reflux (GERD) 08/17/2016  . History of diverticulosis 08/17/2016  . History of hypothyroidism 08/17/2016  . history of asthmatic bronchitis 08/17/2016  . History of memory loss 08/17/2016  . History of atherosclerosis 08/17/2016    Conditions to be addressed/monitored: HTN and Alheimer's Disease; care coordination  Care Plan : [facility] Social Work Care Plan  Updates made by Bevelyn Ngo since 08/26/2020 12:00 AM    Problem: Barriers to Treatment     Long-Range Goal: Collaborate with RN Care Manager to assist with care coordination needs   Start Date: 08/26/2020  Expected End Date: 12/24/2020  Priority: Low  Note:   Current Barriers:  . Chronic disease management support and education needs related to HTN and Alzheimer's Disease   . ADL IADL limitations and Limited access to caregiver  Social Worker Clinical Goal(s):  [name] Over the next 120 days, patient will work with SW to identify and address any acute and/or chronic care coordination needs related to the self health management of HTN and Alzheimer's Disease    CCM SW Interventions:  . Inter-disciplinary care team collaboration (see longitudinal plan of care) . Collaboration with Arnette Felts, FNP regarding development and update of comprehensive plan of care as evidenced by provider attestation and co-signature . Successful outbound call placed to the patient daughter to assess care coordination needs- no acute needs identified at this time . Discussed long term follow up with SW while patient remains actively involved with  RN Case Manager  to address care management needs . Scheduled follow up  call over the next 90 days Patient Goals/Self-Care Activities . Over the next 90 days, patient will: with the help of her family  - Patient will self administer medications as prescribed Patient will attend all scheduled provider appointments Patient will call provider office for new concerns or questions Contact SW as needed prior to next scheduled call  Follow Up Plan: The care management team will reach out to the patient again over the next 90 days.        Follow Up Plan: SW will follow up with patient by phone over the next 90 days      Bevelyn Ngo, BSW, CDP Social Worker, Certified Dementia Practitioner [facility] Care Management [phone]  Total time spent performing care coordination and/or care management activities with the patient by phone or face to face = 12 minutes.

## 2020-08-26 NOTE — Patient Instructions (Signed)
   Goals we discussed today:  Goals Addressed            This Visit's Progress   . Work with SW to manage care coordination needs       Timeframe:  Long-Range Goal Priority:  Low Start Date:   2.2.22                          Expected End Date:   6.2.22                    Next planned outreach: 4.28.22  Patient Goals/Self-Care Activities . Over the next 90 days, patient will: with the help of her family  - Patient will self administer medications as prescribed Patient will attend all scheduled provider appointments Patient will call provider office for new concerns or questions Contact SW as needed prior to next scheduled call

## 2020-08-27 DIAGNOSIS — Z049 Encounter for examination and observation for unspecified reason: Secondary | ICD-10-CM | POA: Diagnosis not present

## 2020-08-28 ENCOUNTER — Ambulatory Visit: Payer: Self-pay

## 2020-08-31 ENCOUNTER — Other Ambulatory Visit: Payer: Self-pay | Admitting: Nurse Practitioner

## 2020-09-17 ENCOUNTER — Other Ambulatory Visit: Payer: Self-pay | Admitting: Nurse Practitioner

## 2020-09-18 DIAGNOSIS — H524 Presbyopia: Secondary | ICD-10-CM | POA: Diagnosis not present

## 2020-09-24 DIAGNOSIS — Z049 Encounter for examination and observation for unspecified reason: Secondary | ICD-10-CM | POA: Diagnosis not present

## 2020-09-28 ENCOUNTER — Other Ambulatory Visit: Payer: Self-pay

## 2020-09-28 ENCOUNTER — Ambulatory Visit (INDEPENDENT_AMBULATORY_CARE_PROVIDER_SITE_OTHER): Payer: Medicare Other | Admitting: Nurse Practitioner

## 2020-09-28 ENCOUNTER — Other Ambulatory Visit: Payer: Self-pay | Admitting: Physician Assistant

## 2020-09-28 ENCOUNTER — Encounter: Payer: Self-pay | Admitting: Nurse Practitioner

## 2020-09-28 ENCOUNTER — Other Ambulatory Visit: Payer: Self-pay | Admitting: Nurse Practitioner

## 2020-09-28 VITALS — BP 122/72 | HR 89 | Temp 98.0°F | Ht 61.0 in | Wt 97.0 lb

## 2020-09-28 DIAGNOSIS — G4700 Insomnia, unspecified: Secondary | ICD-10-CM | POA: Diagnosis not present

## 2020-09-28 DIAGNOSIS — G309 Alzheimer's disease, unspecified: Secondary | ICD-10-CM

## 2020-09-28 DIAGNOSIS — E039 Hypothyroidism, unspecified: Secondary | ICD-10-CM | POA: Diagnosis not present

## 2020-09-28 DIAGNOSIS — F0280 Dementia in other diseases classified elsewhere without behavioral disturbance: Secondary | ICD-10-CM

## 2020-09-28 DIAGNOSIS — I700 Atherosclerosis of aorta: Secondary | ICD-10-CM

## 2020-09-28 DIAGNOSIS — I10 Essential (primary) hypertension: Secondary | ICD-10-CM | POA: Diagnosis not present

## 2020-09-28 DIAGNOSIS — M0579 Rheumatoid arthritis with rheumatoid factor of multiple sites without organ or systems involvement: Secondary | ICD-10-CM

## 2020-09-28 DIAGNOSIS — W19XXXA Unspecified fall, initial encounter: Secondary | ICD-10-CM | POA: Diagnosis not present

## 2020-09-28 MED ORDER — SYNTHROID 75 MCG PO TABS
75.0000 ug | ORAL_TABLET | Freq: Every day | ORAL | 1 refills | Status: DC
Start: 2020-09-28 — End: 2020-11-10

## 2020-09-28 MED ORDER — QUETIAPINE FUMARATE 100 MG PO TABS: 100.0000 mg | ORAL_TABLET | Freq: Every day | ORAL | 1 refills | Status: DC

## 2020-09-28 NOTE — Telephone Encounter (Signed)
Last Visit: 08/06/2020 Next Visit: 01/11/2021 Labs: 06/11/2020, MCV, MCH, MCHC are low but improving. Hemoglobin and hematocrit are WNL. CMP WNL., I called patient's daughter to advise labs are due, patient will come have labs performed with week. TB Gold: 06/11/2020, negative  Current Dose per office note 08/06/2020, enbrel 50 mg sq injections once weekly  DX: Rheumatoid arthritis involving multiple sites with positive rheumatoid factor   Last Fill: 07/06/2020  Okay to refill Enbrel?

## 2020-09-28 NOTE — Progress Notes (Signed)
I,Jordan [name] [employer] as a Education administrator for [employer], FNP.,have documented all relevant documentation on the behalf of Jordan Brine, FNP,as directed by  Jordan Brine, FNP while in the presence of Jordan [name], Jordan [name]. This visit occurred during the SARS-CoV-2 public health emergency.  Safety protocols were in place, including screening questions prior to the visit, additional usage of staff PPE, and extensive cleaning of exam room while observing appropriate contact time as indicated for disinfecting solutions.  Subjective:     Patient ID: Jordan [name] , female    DOB: 1933-08-26 , 87 y.o.   MRN: [id number]   Chief Complaint  Patient presents with  . Insomnia    Patient stated she is not sleeping at all    HPI  Patient presents today for a f/u on insomnia. Patient reports that she is not sleeping at all. Her daughter Jordan [name] feels she has been doing well. She is taking Seroquel - will fall asleep when she takes it but not as long will sleep about 5-6 hours. Her daughter is able to look at the camera and will see her sleeping. She generally takes about 1030 pm will awaken about 3am.    Her ophthalmologist is planning to send her to a program for the blind.    Insomnia Primary symptoms: sleep disturbance.  The onset quality is gradual. The problem occurs intermittently. The treatment provided no relief. PMH includes: no hypertension, no depression.  Fall The accident occurred more than 1 week ago. Fall occurred: fell off the couch and injured her left wrist. She landed on carpet. The point of impact was the left wrist. Pertinent negatives include no abdominal pain. She has tried nothing for the symptoms.     Past Medical History:  Diagnosis Date  . Allergy   . Anxiety    per pt's daughter  . Arthritis   . Asthma    pt reported  . Depression   . GERD (gastroesophageal reflux disease)   . Hearing deficit L  . High cholesterol   . Hypertension   . Osteoporosis   . Rheumatoid  arthritis ([place])   . Stroke [facility])      History reviewed. No pertinent family history.   Current Outpatient Medications:  .  albuterol (VENTOLIN HFA) 108 (90 Base) MCG/ACT inhaler, Inhale 1 puff into the lungs every 6 (six) hours as needed for wheezing or shortness of breath., Disp: 6.7 g, Rfl: 5 .  ALLERGY RELIEF 10 MG tablet, TAKE 1 TABLET BY MOUTH EVERY DAY (Patient taking differently: Take 10 mg by mouth daily.), Disp: 90 tablet, Rfl: 0 .  amLODipine (NORVASC) 5 MG tablet, TAKE 1 TABLET BY MOUTH EVERY DAY, Disp: 30 tablet, Rfl: 2 .  aspirin EC 81 MG tablet, Take 81 mg by mouth daily. Swallow whole., Disp: , Rfl:  .  feeding supplement, ENSURE ENLIVE, (ENSURE ENLIVE) LIQD, Take 237 mLs by mouth daily in the afternoon., Disp: , Rfl:  .  ferrous sulfate 325 (65 FE) MG tablet, Take 325 mg by mouth daily with breakfast., Disp: , Rfl:  .  fluticasone (FLONASE) 50 MCG/ACT nasal spray, INSTILL 2 SPRAYS IN EACH NOSTRIL EVERY DAY (Patient taking differently: Place 2 sprays into both nostrils daily as needed for allergies.), Disp: 16 g, Rfl: 1 .  folic acid (FOLVITE) 1 MG tablet, TAKE 1 TABLET(1 MG) BY MOUTH DAILY (Patient taking differently: Take 1 mg by mouth daily.), Disp: 90 tablet, Rfl: 0 .  latanoprost (XALATAN) 0.005 % ophthalmic solution, Place 1 drop  into both eyes at bedtime. , Disp: , Rfl:  .  NAMZARIC 28-10 MG CP24, TAKE 1 CAPSULE BY MOUTH EVERY DAY, Disp: 90 capsule, Rfl: 1 .  omeprazole (PRILOSEC) 20 MG capsule, Take 1 capsule (20 mg total) by mouth daily. TAKE 1 CAPSULE BY MOUTH EVERY DAY, Disp: 90 capsule, Rfl: 1 .  QUEtiapine (SEROQUEL) 100 MG tablet, Take 1 tablet (100 mg total) by mouth at bedtime., Disp: 90 tablet, Rfl: 1 .  simvastatin (ZOCOR) 40 MG tablet, TAKE 1 TABLET BY MOUTH AT BEDTIME, Disp: 90 tablet, Rfl: 1 .  timolol (TIMOPTIC) 0.5 % ophthalmic solution, 1 drop 2 (two) times daily., Disp: , Rfl:  .  Vitamin D, Ergocalciferol, (DRISDOL) 1.25 MG (50000 UNIT) CAPS capsule,  Take 1 capsule (50,000 Units total) by mouth 2 (two) times a week., Disp: 24 capsule, Rfl: 1 .  ALLERGY RELIEF 10 MG tablet, TAKE 1 TABLET BY MOUTH EVERY DAY, Disp: 30 tablet, Rfl: 0 .  brimonidine-timolol (COMBIGAN) 0.2-0.5 % ophthalmic solution, Place 1 drop into both eyes every 12 (twelve) hours. (Patient not taking: Reported on 09/28/2020), Disp: , Rfl:  .  ENBREL SURECLICK 50 MG/ML injection, Inject 0.98 mLs (50 mg total) into the skin once a week., Disp: 12 mL, Rfl: 0 .  SYNTHROID 75 MCG tablet, Take 1 tablet (75 mcg total) by mouth daily before breakfast., Disp: 90 tablet, Rfl: 1   Allergies  Allergen Reactions  . Penicillins Hives     Review of Systems  Constitutional: Negative.   HENT: Negative.   Eyes: Positive for visual disturbance (legally blind).  Respiratory: Negative.   Cardiovascular: Negative.   Gastrointestinal: Negative.  Negative for abdominal pain.  Endocrine: Negative.   Genitourinary: Negative.   Musculoskeletal: Negative.   Skin: Negative.   Neurological: Negative.   Hematological: Negative.   Psychiatric/Behavioral: Positive for sleep disturbance. Negative for depression. The patient has insomnia.      Today's Vitals   09/28/20 0946  BP: 122/72  Pulse: 89  Temp: 98 F (36.7 C)  TempSrc: Oral  Weight: 97 lb (44 kg)  Height: 5' 1"  (1.549 m)  PainSc: 0-No pain   Body mass index is 18.33 kg/m.   Objective:  Physical Exam Constitutional:      General: She is not in acute distress.    Appearance: Normal appearance. She is normal weight.  Cardiovascular:     Rate and Rhythm: Normal rate and regular rhythm.     Pulses: Normal pulses.     Heart sounds: Normal heart sounds. No murmur heard.   Pulmonary:     Effort: Pulmonary effort is normal.     Breath sounds: Normal breath sounds.  Musculoskeletal:        General: No swelling or tenderness. Normal range of motion.     Cervical back: Normal range of motion and neck supple.  Skin:    General:  Skin is warm and dry.     Capillary Refill: Capillary refill takes less than 2 seconds.  Neurological:     General: No focal deficit present.     Mental Status: She is alert and oriented to person, place, and time.  Psychiatric:        Mood and Affect: Mood normal.        Behavior: Behavior normal.        Thought Content: Thought content normal.        Judgment: Judgment normal.         Assessment And Plan:  1. Insomnia, unspecified type  Chronic, her daughter Jordan [name] feels she is doing better but still not sleeping through the whole night. She would like to try to increase the dose and see if this helps. The patient is legally blind and I feel her saying she is not sleeping has to do with her blindness. - QUEtiapine (SEROQUEL) 100 MG tablet; Take 1 tablet (100 mg total) by mouth at bedtime.  Dispense: 90 tablet; Refill: 1  2. Acquired hypothyroidism  Chronic, she was under normal control at her last visit  Will check levels today - SYNTHROID 75 MCG tablet; Take 1 tablet (75 mcg total) by mouth daily before breakfast.  Dispense: 90 tablet; Refill: 1 - TSH - T3, free - T4 - CMP14+EGFR  3. Essential hypertension  Chronic, well controlled  Continue with current medications  4. Alzheimer's disease ([place])   Chronic, continue with Namzaric  Tolerating well. - CMP14+EGFR  5. Rheumatoid arthritis involving multiple sites with positive rheumatoid factor (HCC)  Chronic, continue follow up with Rheumatology - CMP14+EGFR  6. Fall, initial encounter  [place] [address] from couch had sore left wrist initially however now is doing well.  7. Atherosclerosis of aorta (HCC)  Chronic, continue with statin, tolerating well   Dgt Jordan [name] is on the phone during the office visit  Patient was given opportunity to ask questions. Patient verbalized understanding of the plan and was able to repeat key elements of the plan. All questions were answered to their satisfaction.  Jordan Brine, FNP    I, Jordan Brine, FNP, have reviewed all documentation for this visit. The documentation on 09/28/20 for the exam, diagnosis, procedures, and orders are all accurate and complete.  THE PATIENT IS ENCOURAGED TO PRACTICE SOCIAL DISTANCING DUE TO THE COVID-19 PANDEMIC.

## 2020-09-28 NOTE — Patient Instructions (Signed)

## 2020-09-29 ENCOUNTER — Telehealth: Payer: Self-pay

## 2020-09-29 LAB — CMP14+EGFR
ALT: 8 IU/L (ref 0–32)
AST: 14 IU/L (ref 0–40)
Albumin/Globulin Ratio: 1.4 (ref 1.2–2.2)
Albumin: 4.2 g/dL (ref 3.6–4.6)
Alkaline Phosphatase: 63 IU/L (ref 44–121)
BUN/Creatinine Ratio: 18 (ref 12–28)
BUN: 14 mg/dL (ref 8–27)
Bilirubin Total: <0.2 mg/dL (ref 0.0–1.2)
CO2: 24 mmol/L (ref 20–29)
Calcium: 9.5 mg/dL (ref 8.7–10.3)
Chloride: 102 mmol/L (ref 96–106)
Creatinine, Ser: 0.78 mg/dL (ref 0.57–1.00)
Globulin, Total: 2.9 g/dL (ref 1.5–4.5)
Glucose: 76 mg/dL (ref 65–99)
Potassium: 4.6 mmol/L (ref 3.5–5.2)
Sodium: 140 mmol/L (ref 134–144)
Total Protein: 7.1 g/dL (ref 6.0–8.5)
eGFR: 75 mL/min/{1.73_m2} (ref >59)

## 2020-09-29 LAB — T4: T4, Total: 8.3 ug/dL (ref 4.5–12.0)

## 2020-09-29 LAB — TSH: TSH: 0.78 u[IU]/mL (ref 0.450–4.500)

## 2020-09-29 LAB — T3, FREE: T3, Free: 2 pg/mL (ref 2.0–4.4)

## 2020-09-29 NOTE — Telephone Encounter (Signed)
I called patient, patient advised me to speak to her daughter [name], CBC w/diff still needs to be drawn, [name] will call PCP to see if lab can be ran from labs drawn at PCP on 09/28/2020.

## 2020-09-29 NOTE — Telephone Encounter (Signed)
[name] called stating patient had labwork on Monday, 09/28/20 at her appointment with Dr. Christell Constant.  [name] is asking if that is the same labwork that Dr. Corliss Skains needs or if she has to come to the office to have additional labwork.  Please advise.

## 2020-09-30 ENCOUNTER — Ambulatory Visit (INDEPENDENT_AMBULATORY_CARE_PROVIDER_SITE_OTHER): Payer: Medicare Other

## 2020-09-30 ENCOUNTER — Telehealth: Payer: Medicare Other

## 2020-09-30 ENCOUNTER — Other Ambulatory Visit: Payer: Self-pay

## 2020-09-30 DIAGNOSIS — M0579 Rheumatoid arthritis with rheumatoid factor of multiple sites without organ or systems involvement: Secondary | ICD-10-CM

## 2020-09-30 DIAGNOSIS — I10 Essential (primary) hypertension: Secondary | ICD-10-CM | POA: Diagnosis not present

## 2020-09-30 DIAGNOSIS — E559 Vitamin D deficiency, unspecified: Secondary | ICD-10-CM

## 2020-09-30 DIAGNOSIS — E039 Hypothyroidism, unspecified: Secondary | ICD-10-CM | POA: Diagnosis not present

## 2020-09-30 DIAGNOSIS — F0280 Dementia in other diseases classified elsewhere without behavioral disturbance: Secondary | ICD-10-CM

## 2020-09-30 DIAGNOSIS — G309 Alzheimer's disease, unspecified: Secondary | ICD-10-CM | POA: Diagnosis not present

## 2020-10-01 ENCOUNTER — Other Ambulatory Visit: Payer: Self-pay

## 2020-10-01 ENCOUNTER — Other Ambulatory Visit: Payer: Medicare Other

## 2020-10-01 DIAGNOSIS — M0579 Rheumatoid arthritis with rheumatoid factor of multiple sites without organ or systems involvement: Secondary | ICD-10-CM

## 2020-10-01 LAB — CBC WITH DIFFERENTIAL/PLATELET
Basophils Absolute: 0.1 10*3/uL (ref 0.0–0.2)
Basos: 1 %
EOS (ABSOLUTE): 0 10*3/uL (ref 0.0–0.4)
Eos: 0 %
Hematocrit: 41.2 % (ref 34.0–46.6)
Hemoglobin: 13 g/dL (ref 11.1–15.9)
Immature Grans (Abs): 0 10*3/uL (ref 0.0–0.1)
Immature Granulocytes: 0 %
Lymphocytes Absolute: 2.8 10*3/uL (ref 0.7–3.1)
Lymphs: 38 %
MCH: 24.3 pg — ABNORMAL LOW (ref 26.6–33.0)
MCHC: 31.6 g/dL (ref 31.5–35.7)
MCV: 77 fL — ABNORMAL LOW (ref 79–97)
Monocytes Absolute: 0.8 10*3/uL (ref 0.1–0.9)
Monocytes: 11 %
Neutrophils Absolute: 3.8 10*3/uL (ref 1.4–7.0)
Neutrophils: 50 %
Platelets: 260 10*3/uL (ref 150–450)
RBC: 5.35 x10E6/uL — ABNORMAL HIGH (ref 3.77–5.28)
RDW: 14.5 % (ref 11.7–15.4)
WBC: 7.5 10*3/uL (ref 3.4–10.8)

## 2020-10-01 IMAGING — CT CT HEAD W/O CM
2 series · 15 of 30 positions shown, 17 images · non-contrast
Comparison: Head CT 12/04/2019, brain MRI 05/18/2017.

CLINICAL DATA: Mental status change, unknown cause. Additional
provided: Weakness, hypotension, back pain.

EXAM:
CT HEAD WITHOUT CONTRAST
TECHNIQUE: Contiguous axial images were obtained from the base of the skull
through the vertex without intravenous contrast.

[Series 3: head without · axial · non-contrast · 0.47mm/px · z∈[-178,-58]mm · 7 of 32 slices shown, 9 images]
[im 4/32  brain]
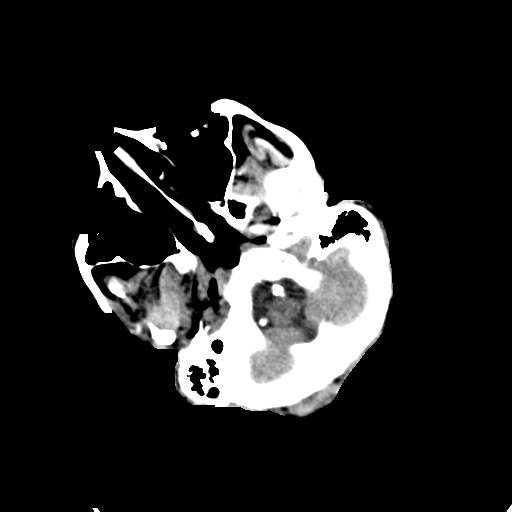
[im 4/32  bone]
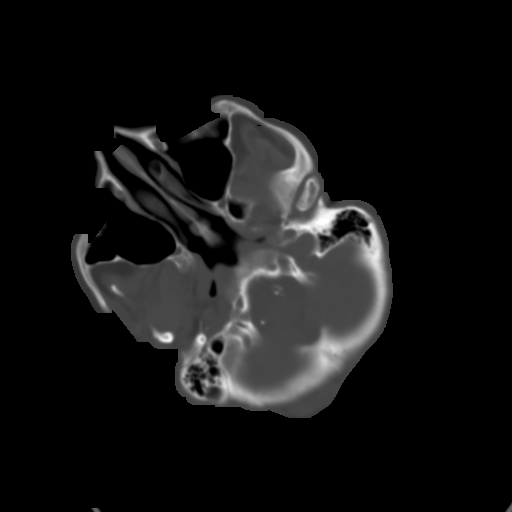
[im 8/32  brain]
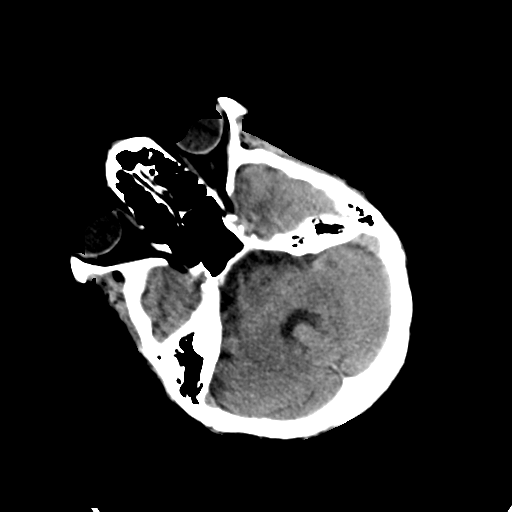
[im 12/32  brain]
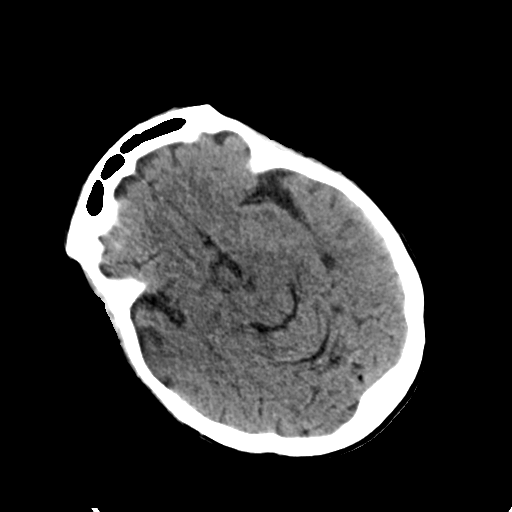
[im 16/32  brain]
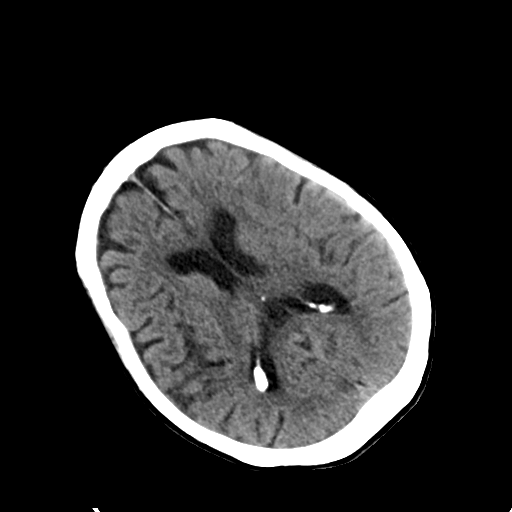
[im 20/32  brain]
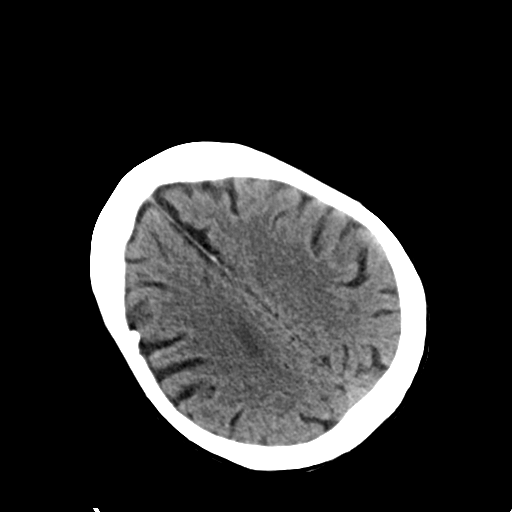
[im 20/32  bone]
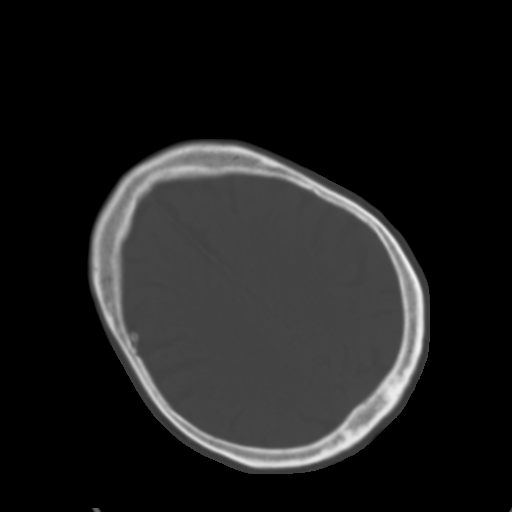
[im 24/32  brain]
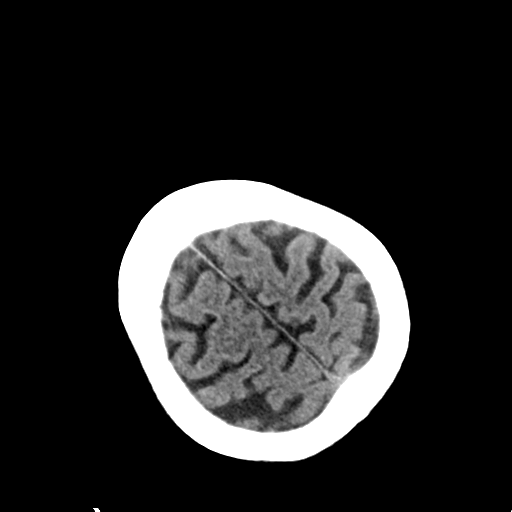
[im 28/32  brain]
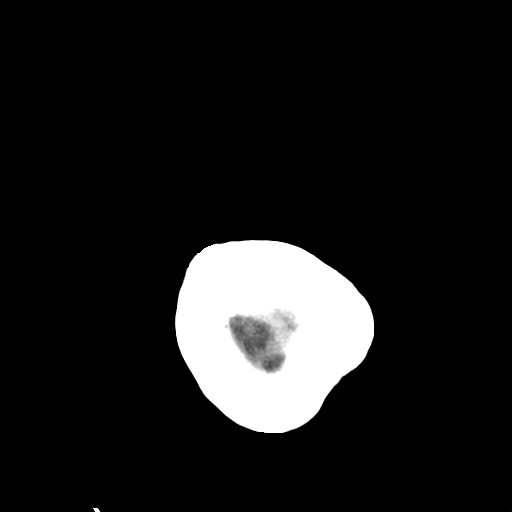

[Series 4: head bone · axial · 0.47mm/px · z∈[-179,-53]mm · 8 of 79 slices shown]
[im 8/79  bone]
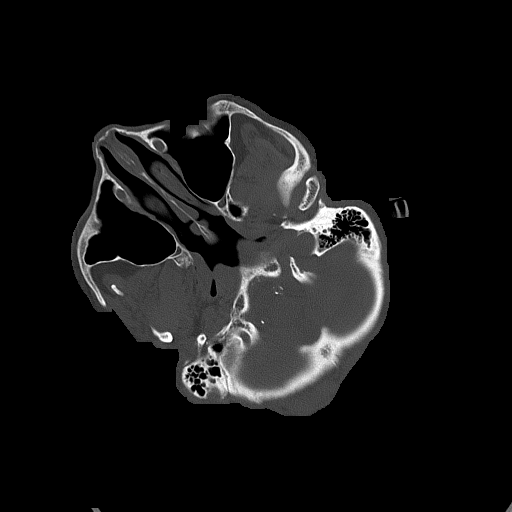
[im 16/79  bone]
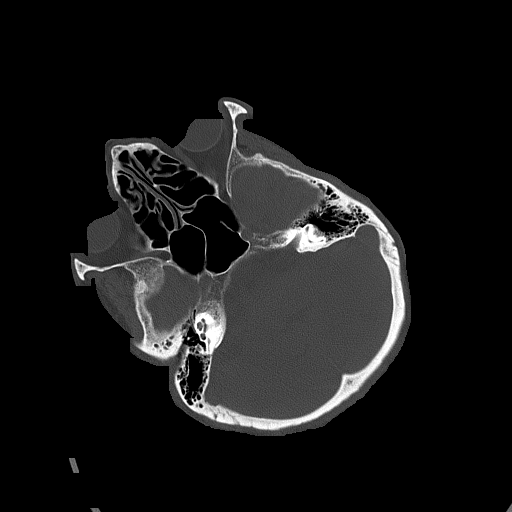
[im 24/79  bone]
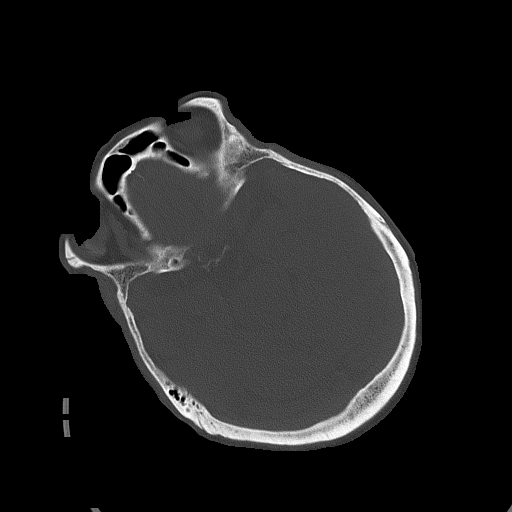
[im 36/79  bone]
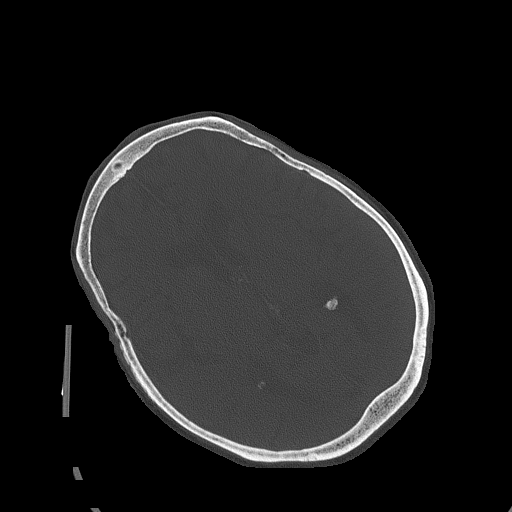
[im 43/79  bone]
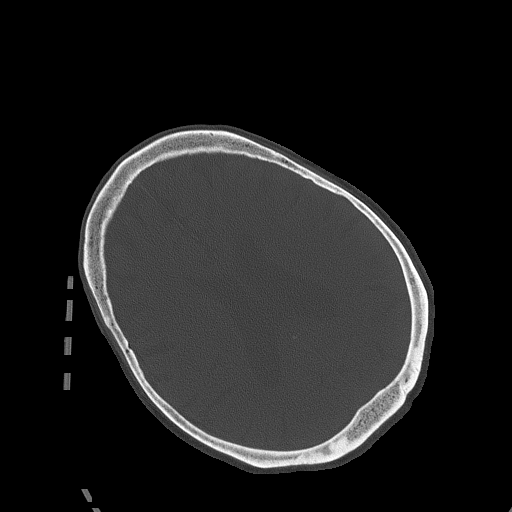
[im 55/79  bone]
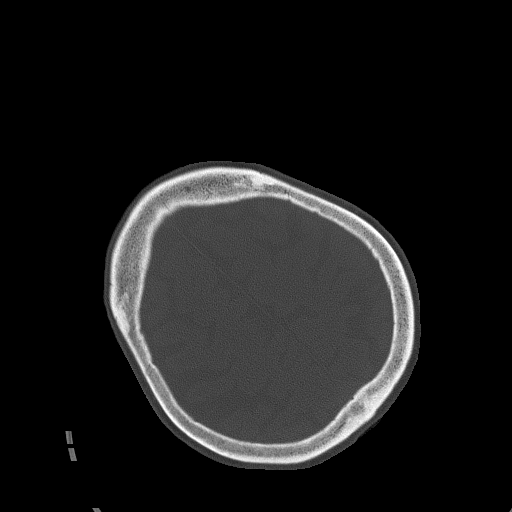
[im 63/79  bone]
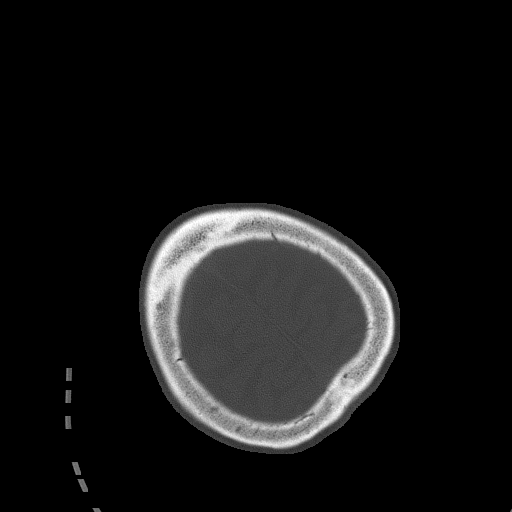
[im 71/79  bone]
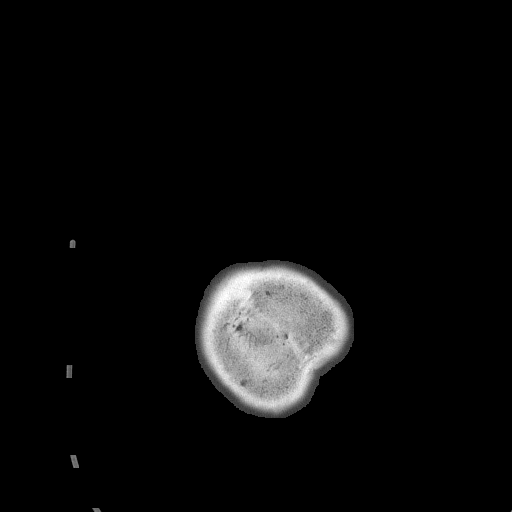

[15 of 30 positions shown; findings below may reference images not displayed]

FINDINGS: Brain:

Stable, mild generalized parenchymal atrophy.

Stable, mild ill-defined hypoattenuation within the cerebral white
matter which is nonspecific, but consistent with chronic small
vessel ischemic disease.

There is no acute intracranial hemorrhage.

No demarcated cortical infarct.

No extra-axial fluid collection.

No evidence of intracranial mass.

No midline shift.

Vascular: No hyperdense vessel atherosclerotic calcifications

Skull: Normal. Negative for fracture or focal lesion.

Sinuses/Orbits: Right-sided glaucoma valve. Visualized orbits show
no acute finding. Minimal ethmoid sinus mucosal thickening.
IMPRESSION: No CT evidence of acute intracranial abnormality.

Stable mild generalized parenchymal atrophy and chronic small vessel
ischemic disease.

Minimal ethmoid sinus mucosal thickening.

## 2020-10-05 NOTE — Patient Instructions (Signed)
Goals Addressed    . Care for the Caregiver-Dementia   On track    Timeframe:  Long-Range Goal Priority:  High Start Date: 09/30/20                          Expected End Date: 04/02/21                    Follow Up Date: 08/14/20    - make a list of family or friends that I can call  - Assist patient with taking all medications as prescribed for this condition - keep all MD appointments  - notify MD of new or worsening symptoms promptly   Why is this important?    People who care for a person with dementia often feel stress.   Too much stress can be harmful to both of you.   You may feel depressed, exhausted, have trouble sleeping or have health problems.   You need to take care of yourself and reach out for help when you need it.   There are some things that can be done to keep the quality of your life satisfying.     Notes:     . Evaluate and treat Vitamin D deficiency   On track    Timeframe:  Long-Range Goal Priority:  High Start Date: 09/30/20                           Expected End Date: 04/02/21  Patient Goals/Self-Care Activities:  - follow up with PCP as scheduled for Vitamin d blood test - take your Vitamin D supplement as directed by PCP - Eat Vitamin rich foods including,  - fatty fish - egg yolks - Fortified cereals - milk and juices with added vitamin D - yogurt - beef liver - Spend at least 15 minutes each day in the sun light or as much as possible                         . COMPLETED: Follow My Treatment Plan-Chronic Kidney       Timeframe:  Long-Range Goal Priority:  High Start Date: 07/01/20                            Expected End Date: 09/29/20                      Follow Up Date: 08/14/20   - call the doctor or nurse before I stop taking medicine - keep follow-up appointments - set alarm to remind to take medications    Why is this important?    Staying as healthy as you can is very important. This may mean making changes if you smoke, don't exercise  or eat poorly.   A healthy lifestyle is an important goal for you.   Following the treatment plan and making changes may be hard.   Try some of these steps to help keep the disease from getting worse.     Notes:     . COMPLETED: Manage My Medicine       Timeframe:  Long-Range Goal Priority:  High Start Date: 07/01/20                           Expected End Date: 09/29/20  Follow Up Date: 08/14/20   - call for medicine refill 2 or 3 days before it runs out - keep a list of all the medicines I take; vitamins and herbals too - use a pillbox to sort medicine - use an alarm clock or phone to remind me to take my medicine  - take Levothyroxine (Synthroid) exactly as directed by PCP (take Synthroid first thing in the morning with a sip of water and wait 30 minutes before taking other medications and or eating/drinking; Wait at least 4 hours before taking Iron supplements and multivitamins with iron Calcium supplements or multivitamins with calcium or Antacids - keep lab appointment with PCP for TSH recheck (07/21/20 @ 9:30 am)   Why is this important?   . These steps will help you keep on track with your medicines.   Notes:     . Track and Manage My Blood Pressure-Hypertension   On track    Timeframe:  Long-Range Goal Priority:  High Start Date: 09/30/20                          Expected End Date: 04/02/21                      Follow Up Date: 01/22/21    - check blood pressure 3 times per week - write blood pressure results in a log or diary  - Attends all scheduled provider appointments - Calls provider office for new concerns, questions, or BP outside discussed parameters - Checks BP and records as discussed - Follows a low sodium diet/DASH diet   Why is this important?    You won't feel high blood pressure, but it can still hurt your blood vessels.   High blood pressure can cause heart or kidney problems. It can also cause a stroke.   Making lifestyle changes  like losing a Sigfredo Schreier weight or eating less salt will help.   Checking your blood pressure at home and at different times of the day can help to control blood pressure.   If the doctor prescribes medicine remember to take it the way the doctor ordered.   Call the office if you cannot afford the medicine or if there are questions about it.     Notes:

## 2020-10-05 NOTE — Chronic Care Management (AMB) (Signed)
Chronic Care Management   CCM RN Visit Note  09/30/2020 Name: Jordan [name] MRN: [id number] DOB: [birth year]  Subjective: Jordan [name] is a 87 y.o. year old female who is a primary care patient of [name], [place]. The care management team was consulted for assistance with disease management and care coordination needs.    Engaged with patient by telephone for follow up visit in response to provider referral for case management and/or care coordination services.   Consent to Services:  The patient was given information about Chronic Care Management services, agreed to services, and gave verbal consent prior to initiation of services.  Please see initial visit note for detailed documentation.   Patient agreed to services and verbal consent obtained.   Assessment: Review of patient past medical history, allergies, medications, health status, including review of consultants reports, laboratory and other test data, was performed as part of comprehensive evaluation and provision of chronic care management services.   SDOH (Social Determinants of Health) assessments and interventions performed:  Yes, no new acute challenges   CCM Care Plan  Allergies  Allergen Reactions  . Penicillins Hives    Outpatient Encounter Medications as of 09/30/2020  Medication Sig  . albuterol (VENTOLIN HFA) 108 (90 Base) MCG/ACT inhaler Inhale 1 puff into the lungs every 6 (six) hours as needed for wheezing or shortness of breath.  . ALLERGY RELIEF 10 MG tablet TAKE 1 TABLET BY MOUTH EVERY DAY  . amLODipine (NORVASC) 5 MG tablet TAKE 1 TABLET BY MOUTH EVERY DAY  . aspirin EC 81 MG tablet Take 81 mg by mouth daily. Swallow whole.  [name] ENBREL SURECLICK 50 MG/ML injection Inject 0.98 mLs (50 mg total) into the skin once a week.  . feeding supplement, ENSURE ENLIVE, (ENSURE ENLIVE) LIQD Take 237 mLs by mouth daily in the afternoon.  . ferrous sulfate 325 (65 FE) MG tablet Take 325 mg by mouth daily with  breakfast.  . fluticasone (FLONASE) 50 MCG/ACT nasal spray INSTILL 2 SPRAYS IN EACH NOSTRIL EVERY DAY (Patient taking differently: Place 2 sprays into both nostrils daily as needed for allergies.)  . folic acid (FOLVITE) 1 MG tablet TAKE 1 TABLET(1 MG) BY MOUTH DAILY (Patient taking differently: Take 1 mg by mouth daily.)  . latanoprost (XALATAN) 0.005 % ophthalmic solution Place 1 drop into both eyes at bedtime.   [name] NAMZARIC 28-10 MG CP24 TAKE 1 CAPSULE BY MOUTH EVERY DAY  . omeprazole (PRILOSEC) 20 MG capsule Take 1 capsule (20 mg total) by mouth daily. TAKE 1 CAPSULE BY MOUTH EVERY DAY  . QUEtiapine (SEROQUEL) 100 MG tablet Take 1 tablet (100 mg total) by mouth at bedtime.  . simvastatin (ZOCOR) 40 MG tablet TAKE 1 TABLET BY MOUTH AT BEDTIME  . SYNTHROID 75 MCG tablet Take 1 tablet (75 mcg total) by mouth daily before breakfast.  . timolol (TIMOPTIC) 0.5 % ophthalmic solution 1 drop 2 (two) times daily.  . Vitamin D, Ergocalciferol, (DRISDOL) 1.25 MG (50000 UNIT) CAPS capsule Take 1 capsule (50,000 Units total) by mouth 2 (two) times a week.  . [DISCONTINUED] ALLERGY RELIEF 10 MG tablet TAKE 1 TABLET BY MOUTH EVERY DAY (Patient taking differently: Take 10 mg by mouth daily.)  . brimonidine-timolol (COMBIGAN) 0.2-0.5 % ophthalmic solution Place 1 drop into both eyes every 12 (twelve) hours. (Patient not taking: No sig reported)   No facility-administered encounter medications on file as of 09/30/2020.    Patient Active Problem List   Diagnosis Date Noted  . Chest pain   .  Hypotension 03/24/2020  . Alzheimer's disease ([place]) 08/13/2019  . Essential hypertension 08/13/2018  . Acquired hypothyroidism 08/13/2018  . Rash and nonspecific skin eruption 08/13/2018  . Memory loss 04/12/2017  . Depression 04/12/2017  . Rheumatoid arthritis involving multiple sites with positive rheumatoid factor ([place]) 08/17/2016  . High risk medication use 08/17/2016  . H/O total knee replacement, bilateral  08/17/2016  . Spondylosis of lumbar region without myelopathy or radiculopathy 08/17/2016  . History of glaucoma/ patient is legally blind 08/17/2016  . History of gastroesophageal reflux (GERD) 08/17/2016  . History of diverticulosis 08/17/2016  . History of hypothyroidism 08/17/2016  . history of asthmatic bronchitis 08/17/2016  . History of memory loss 08/17/2016  . History of atherosclerosis 08/17/2016    Conditions to be addressed/monitored:HTN, Rheumatoid arthritis, Vitamin D deficiency, Alzheimer's disease, Acquired Hypothyroidism  Care Plan : Chronic Kidney (Adult)  Updates made by Lynne Logan, RN since 10/05/2020 12:00 AM  Completed 10/05/2020  Problem: Disease Progression Resolved 09/30/2020  Priority: High    Long-Range Goal: Disease Progression Prevented or Minimized Completed 09/30/2020  Start Date: 07/01/2020  Expected End Date: 09/29/2020  This Visit's Progress: On track  Recent Progress: On track  Priority: High  Note:   Current Barriers:   Ineffective Self Health Maintenance  Unable to self administer medications as prescribed  Unable to perform IADLs independently  Currently UNABLE TO independently self manage needs related to chronic health conditions.   Knowledge Deficits related to short term plan for care coordination needs and long term plans for chronic disease management needs Nurse Case Manager Clinical Goal(s):   Over the next 90 days, patient will work with care management team to address care coordination and chronic disease management needs related to Disease Management  Educational Needs  Care Coordination  Medication Management and Education  Caregiver Stress support  Dementia and Caregiver Support   Interventions:  09/30/20 Successful call completed with daughter Jordan [name]   Reviewed and discussed current renal function per review of current GFR/Serum Creatinine and BUN levels  Determined patient's renal function has improved and patient is  currently at target range for optimal renal function   Determined patient's daughter influenced lifestyle changes including dietary changes to include no salt and or caffeine, maintaining a healthy weight, exercise and activity, to improve long-term outcomes.  Patient Goals/Self Care Activities:  - call the doctor or nurse before I stop taking medicine - keep follow-up appointments - set alarm to remind to take medications Follow Up Plan: Telephone follow up appointment with care management team member scheduled for: 08/14/20   Care Plan : Dementia (Adult)  Updates made by Lynne Logan, RN since 10/05/2020 12:00 AM    Problem: Caregiver Stress   Priority: High    Long-Range Goal: Caregiver Coping Optimized   Start Date: 09/30/2020  Expected End Date: 04/02/2021  This Visit's Progress: On track  Recent Progress: On track  Priority: High  Note:   Current Barriers:   Ineffective Self Health Maintenance  Unable to self administer medications as prescribed  Unable to perform IADLs independently  Blindness due to Glaucoma   Currently UNABLE TO independently self manage needs related to chronic health conditions.   Knowledge Deficits related to short term plan for care coordination needs and long term plans for chronic disease management needs Nurse Case Manager Clinical Goal(s):   Over the next 180 days, patient will work with care management team to address care coordination and chronic disease management needs related to Disease Management  Educational Needs  Care Coordination  Medication Management and Education  Caregiver Stress support  Dementia and Caregiver Support   Interventions:  09/30/20 Successful call completed with daughter Jordan [name]  Validated daughter's feelings about dementia, as well as the impact on the caregiver and extended family; provide education and support to align with needs and stage of dementia.   Acknowledged feelings of grief associated with  dementia such as loss of relationship, recreational activities, future with patient and loss associated with out-of-home placement.   Encouraged verbalization of feelings without ruminating.   Determined daughter feels her ability to care for her mother is stable at this time and she continues to stay engaged with the embedded BSW for support and resources   Discussed plans with patient for ongoing care management follow up and provided patient with direct contact information for care management team Patient Goals/Self Care Activities: - make a list of family or friends that I can call  - Assist patient with taking all medications as prescribed for this condition - keep all MD appointments  - notify MD of new or worsening symptoms promptly  Follow Up Plan: Telephone follow up appointment with care management team member scheduled for: 01/22/21   Problem: Cognitive Function   Priority: High    Long-Range Goal: Optimal Cognitive Function   Start Date: 09/30/2020  Expected End Date: 04/02/2021  This Visit's Progress: On track  Recent Progress: On track  Priority: High  Note:   Current Barriers:   Ineffective Self Health Maintenance  Unable to self administer medications as prescribed  Unable to perform IADLs independently  Blindness due to Glaucoma   Currently UNABLE TO independently self manage needs related to chronic health conditions.   Knowledge Deficits related to short term plan for care coordination needs and long term plans for chronic disease management needs Nurse Case Manager Clinical Goal(s):   Over the next 180 days, patient will work with care management team to address care coordination and chronic disease management needs related to Disease Management  Educational Needs  Care Coordination  Medication Management and Education  Psychosocial Support  Dementia and Caregiver Support   Interventions:  09/30/20 successful call completed with daughter Jordan [name]   Encouraged social relationships and physical activity based on ability.  Promoted strategies that will preserve the patient's abilities; focused on strengths and quality of life optimization.  Discussed plans with patient for ongoing care management follow up and provided patient with direct contact information for care management team Patient Goals/Self Care Activities: - make a list of family or friends that I can call  - take all medications as prescribed for this condition - keep all MD appointments  - notify MD of new or worsening symptoms promptly Follow Up Plan: Telephone follow up appointment with care management team member scheduled for: 01/22/21   Care Plan : Hypertension (Adult)  Updates made by Lynne Logan, RN since 10/05/2020 12:00 AM    Problem: Disease Progression (Hypertension)   Priority: High    Long-Range Goal: Disease Progression Prevented or Minimized   Start Date: 09/30/2020  Expected End Date: 04/02/2021  Recent Progress: On track  Priority: High  Note:   Objective:  . Last practice recorded BP readings:  BP Readings from Last 3 Encounters:  06/26/20 130/84  06/16/20 116/68  05/14/20 114/68 .   [name] Most recent eGFR/CrCl: No results found for: EGFR  No components found for: CRCL Current Barriers:  [name] Knowledge Deficits related to basic understanding of hypertension pathophysiology  and self care management . Knowledge Deficits related to understanding of medications prescribed for management of hypertension . Cognitive Deficits . Unable to self administer medications as prescribed . Unable to perform IADLs independently Case Manager Clinical Goal(s):  [name] Over the next 180 days, patient will demonstrate improved adherence to prescribed treatment plan for hypertension as evidenced by taking all medications as prescribed, monitoring and recording blood pressure as directed, adhering to low sodium/DASH diet Interventions:  09/30/20 Successful call completed with  daughter Jordan [name]  . Evaluation of current treatment plan related to hypertension self management and patient's adherence to plan as established by provider. . Provided education to patient re: stroke prevention, s/s of heart attack and stroke, DASH diet, complications of uncontrolled blood pressure . Reviewed medications with patient and discussed importance of compliance . Encouraged the avoidance of no more than 2 hours per day of sedentary activity, such as recreational screen time.  . Encourage to consider moderate reduction in sodium intake by avoiding the addition of salt to prepared foods and limiting processed meats, canned soup, frozen meals and salty snacks.   . Discussed plans with patient for ongoing care management follow up and provided patient with direct contact information for care management team Patient Goals/Self-Care Activities . Over the next 180 days, patient will:  Attends all scheduled provider appointments Calls provider office for new concerns, questions, or BP outside discussed parameters Checks BP and records as discussed Follows a low sodium diet/DASH diet - check blood pressure 3 times per week - write blood pressure results in a log or diary  Follow Up Plan: Telephone follow up appointment with care management team member scheduled for: 01/22/21   Care Plan : Wellness (Adult)  Updates made by Lynne Logan, RN since 10/05/2020 12:00 AM  Completed 10/05/2020  Problem: Medication Adherence (Wellness) Resolved 09/30/2020  Priority: High    Long-Range Goal: Medication Adherence Maintained Completed 09/30/2020  Start Date: 07/01/2020  Expected End Date: 09/29/2020  Recent Progress: On track  Priority: High  Note:   Current Barriers:   Ineffective Self Health Maintenance  Unable to self administer medications as prescribed  Unable to perform IADLs independently  Currently UNABLE TO independently self manage needs related to chronic health conditions.   Knowledge  Deficits related to short term plan for care coordination needs and long term plans for chronic disease management needs Nurse Case Manager Clinical Goal(s):   Over the next 90 days, patient will work with care management team to address care coordination and chronic disease management needs related to Disease Management  Educational Needs  Care Coordination  Medication Management and Education  Psychosocial Support  Dementia and Caregiver Support   Interventions:   Review all medications to determine if patient or caregiver knows why the medications are given and if taken as prescribed.   Completed medication adherence assessment including barriers to medication adherence.   Assessed for barriers to medication adherence.   Encouraged ongoing use of medication reminders such as clock or cell phone alarm, color coding, pillboxes for am/pm and days of the week, pharmacy refill reminder, auto-refill system or mail-order option. Patient Goals/Self Care Activities:  - call for medicine refill 2 or 3 days before it runs out - keep a list of all the medicines I take; vitamins and herbals too - take Levothyroxine (Synthroid) exactly as directed by PCP (take Synthroid first thing in the morning with a sip of water and wait 30 minutes before taking other medications and or eating/drinking;  Wait at least 4 hours before taking Iron supplements and multivitamins with iron Calcium supplements or multivitamins with calcium or Antacids - keep lab appointment with PCP for TSH recheck (07/21/20 @ 9:30 am) - use a pillbox to sort medicine - use an alarm clock or phone to remind me to take my medicine  Follow Up Plan: Telephone follow up appointment with care management team member scheduled for: 08/14/20   Care Plan : Vitamin D deficiency  Updates made by Lynne Logan, RN since 10/05/2020 12:00 AM    Problem: Vitamin D deficiency   Priority: High    Long-Range Goal: Vitamin D deficinecy  improved and or resolved   Start Date: 09/30/2020  Expected End Date: 04/02/2021  This Visit's Progress: On track  Priority: High  Note:   Current Barriers:   Ineffective Self Health Maintenance  Blindness due to Glaucoma  Clinical Goal(s):  [name] Collaboration with [name], FNP regarding development and update of comprehensive plan of care as evidenced by provider attestation and co-signature . Inter-disciplinary care team collaboration (see longitudinal plan of care)  patient will work with care management team to address care coordination and chronic disease management needs related to Disease Management  Educational Needs  Care Coordination  Medication Management and Education  Psychosocial Support   Interventions:  09/30/20 successful call completed with daughter Jordan [name]   Evaluation of current treatment plan related to  HTN, Rheumatoid arthritis, Vitamin D deficiency, Alzheimer's disease, Acquired Hypothyroidism , self-management and patient's adherence to plan as established by provider.  Collaboration with [name], FNP regarding development and update of comprehensive plan of care as evidenced by provider attestation       and co-signature  Inter-disciplinary care team collaboration (see longitudinal plan of care)  Reviewed and discussed Vitamin D at last check was at 9.6  Reviewed and discussed current medication regimen and patient is adhering to the prescribed medication/dosing for this condition  Assessed for knowledge related to Vitamin D rich foods, daughter is aware and is assisting with grocery shopping/meals   Discussed next PCP OV follow up scheduled with [name], [place] set for 01/20/21 @11 :00 AM   Discussed plans with patient for ongoing care management follow up and provided patient with direct contact information for care management team Self Care Activities:  . - follow up with PCP as scheduled for Vitamin d blood test . - take your Vitamin D  supplement as directed by PCP . - Eat Vitamin rich foods including,  . - fatty fish . - egg yolks . - Fortified cereals . - milk and juices with added vitamin D . - yogurt . - beef liver . - Spend at least 15 minutes each day in the sun light or as much as possible     Patient Goals: To improve or resolve Vitamin D deficiency   Next Scheduled Follow up date: 01/22/21    Plan:Telephone follow up appointment with care management team member scheduled for:  01/22/21  Barb Merino, RN, BSN, CCM Care Management Coordinator [facility]  Direct Phone: [phone]

## 2020-10-08 NOTE — Progress Notes (Signed)
She can give her an over the counter iron supplement 3 times a week, she needs to make sure she is having good bowel movements, she should take a stool softner

## 2020-10-09 ENCOUNTER — Telehealth: Payer: Self-pay

## 2020-10-09 ENCOUNTER — Ambulatory Visit: Payer: Medicare Other

## 2020-10-09 NOTE — Telephone Encounter (Signed)
She can give her an over the counter iron supplement 3 times a week, she needs to make sure she is having good bowel movements, she should take a stool softner  Left a detailed VM

## 2020-10-19 ENCOUNTER — Other Ambulatory Visit: Payer: Self-pay | Admitting: Nurse Practitioner

## 2020-11-05 ENCOUNTER — Ambulatory Visit (INDEPENDENT_AMBULATORY_CARE_PROVIDER_SITE_OTHER): Payer: Medicare Other | Admitting: Nurse Practitioner

## 2020-11-05 ENCOUNTER — Encounter: Payer: Self-pay | Admitting: Nurse Practitioner

## 2020-11-05 ENCOUNTER — Other Ambulatory Visit: Payer: Self-pay

## 2020-11-05 VITALS — BP 136/64 | HR 71 | Temp 97.7°F | Ht 61.0 in | Wt 101.0 lb

## 2020-11-05 DIAGNOSIS — R002 Palpitations: Secondary | ICD-10-CM

## 2020-11-05 DIAGNOSIS — E039 Hypothyroidism, unspecified: Secondary | ICD-10-CM | POA: Diagnosis not present

## 2020-11-05 NOTE — Progress Notes (Signed)
I,Jordan [name] as a Neurosurgeon for [employer], FNP.,have documented all relevant documentation on the behalf of Arnette Felts, FNP,as directed by  Arnette Felts, FNP while in the presence of Arnette Felts, FNP. This visit occurred during the SARS-CoV-2 public health emergency.  Safety protocols were in place, including screening questions prior to the visit, additional usage of staff PPE, and extensive cleaning of exam room while observing appropriate contact time as indicated for disinfecting solutions.  Subjective:     Patient ID: Jordan [name] , female    DOB: Feb 02, 1934 , 87 y.o.   MRN: [id number]   Chief Complaint  Patient presents with  . Palpitations    Patient has been experiencing heart palpitations for a little over a week.     HPI  Patient presents today with her daughter Jordan [name] for heart palpitations. She has been experiencing this for a little over a week.   Was reported the patient was having palpitations, she is not the best historian. Denies chest pain.      Past Medical History:  Diagnosis Date  . Allergy   . Anxiety    per pt's daughter  . Arthritis   . Asthma    pt reported  . Depression   . GERD (gastroesophageal reflux disease)   . Hearing deficit L  . High cholesterol   . Hypertension   . Osteoporosis   . Rheumatoid arthritis (HCC)   . Stroke [facility])      History reviewed. No pertinent family history.   Current Outpatient Medications:  .  albuterol (VENTOLIN HFA) 108 (90 Base) MCG/ACT inhaler, Inhale 1 puff into the lungs every 6 (six) hours as needed for wheezing or shortness of breath., Disp: 6.7 g, Rfl: 5 .  ALLERGY RELIEF 10 MG tablet, TAKE 1 TABLET BY MOUTH EVERY DAY, Disp: 30 tablet, Rfl: 1 .  amLODipine (NORVASC) 5 MG tablet, TAKE 1 TABLET BY MOUTH EVERY DAY, Disp: 30 tablet, Rfl: 2 .  aspirin EC 81 MG tablet, Take 81 mg by mouth daily. Swallow whole., Disp: , Rfl:  .  ENBREL SURECLICK 50 MG/ML injection, Inject 0.98 mLs (50  mg total) into the skin once a week., Disp: 12 mL, Rfl: 0 .  feeding supplement, ENSURE ENLIVE, (ENSURE ENLIVE) LIQD, Take 237 mLs by mouth daily in the afternoon., Disp: , Rfl:  .  ferrous sulfate 325 (65 FE) MG tablet, Take 325 mg by mouth daily with breakfast., Disp: , Rfl:  .  fluticasone (FLONASE) 50 MCG/ACT nasal spray, INSTILL 2 SPRAYS IN EACH NOSTRIL EVERY DAY (Patient taking differently: Place 2 sprays into both nostrils daily as needed for allergies.), Disp: 16 g, Rfl: 1 .  folic acid (FOLVITE) 1 MG tablet, TAKE 1 TABLET(1 MG) BY MOUTH DAILY (Patient taking differently: Take 1 mg by mouth daily.), Disp: 90 tablet, Rfl: 0 .  latanoprost (XALATAN) 0.005 % ophthalmic solution, Place 1 drop into both eyes at bedtime. , Disp: , Rfl:  .  NAMZARIC 28-10 MG CP24, TAKE 1 CAPSULE BY MOUTH EVERY DAY, Disp: 90 capsule, Rfl: 1 .  omeprazole (PRILOSEC) 20 MG capsule, TAKE 1 CAPSULE BY MOUTH EVERY DAY, Disp: 90 capsule, Rfl: 1 .  QUEtiapine (SEROQUEL) 100 MG tablet, Take 1 tablet (100 mg total) by mouth at bedtime., Disp: 90 tablet, Rfl: 1 .  simvastatin (ZOCOR) 40 MG tablet, TAKE 1 TABLET BY MOUTH AT BEDTIME, Disp: 90 tablet, Rfl: 1 .  timolol (TIMOPTIC) 0.5 % ophthalmic solution, 1 drop 2 (two) times  daily., Disp: , Rfl:  .  Vitamin D, Ergocalciferol, (DRISDOL) 1.25 MG (50000 UNIT) CAPS capsule, Take 1 capsule (50,000 Units total) by mouth 2 (two) times a week., Disp: 24 capsule, Rfl: 1 .  brimonidine-timolol (COMBIGAN) 0.2-0.5 % ophthalmic solution, Place 1 drop into both eyes every 12 (twelve) hours. (Patient not taking: No sig reported), Disp: , Rfl:  .  SYNTHROID 75 MCG tablet, Take 1 tablet by mouth daily at breakfast on Mon - Sat, hold on Sun, Disp: 90 tablet, Rfl: 1   Allergies  Allergen Reactions  . Penicillins Hives     Review of Systems  Constitutional: Negative.   HENT: Negative.   Eyes: Positive for visual disturbance (legally blind).  Respiratory: Negative.  Negative for shortness  of breath.   Cardiovascular: Positive for palpitations. Negative for chest pain and leg swelling.  Gastrointestinal: Negative.  Negative for abdominal pain.  Endocrine: Negative.   Genitourinary: Negative.   Musculoskeletal: Negative.   Skin: Negative.   Neurological: Negative.   Hematological: Negative.   Psychiatric/Behavioral: Negative.      Today's Vitals   11/05/20 1215  BP: 136/64  Pulse: 71  Temp: 97.7 F (36.5 C)  TempSrc: Oral  Weight: 101 lb (45.8 kg)  Height: 5\' 1"  (1.549 m)  PainSc: 0-No pain   Body mass index is 19.08 kg/m.   Objective:  Physical Exam Constitutional:      General: She is not in acute distress.    Appearance: Normal appearance. She is normal weight.  Cardiovascular:     Rate and Rhythm: Normal rate and regular rhythm.     Pulses: Normal pulses.     Heart sounds: Normal heart sounds. No murmur heard.   Pulmonary:     Effort: Pulmonary effort is normal. No respiratory distress.     Breath sounds: Normal breath sounds. No wheezing.  Musculoskeletal:        General: No swelling or tenderness. Normal range of motion.     Cervical back: Normal range of motion and neck supple.  Skin:    General: Skin is warm and dry.     Capillary Refill: Capillary refill takes less than 2 seconds.  Neurological:     General: No focal deficit present.     Mental Status: She is alert and oriented to person, place, and time.  Psychiatric:        Mood and Affect: Mood normal.        Behavior: Behavior normal.        Thought Content: Thought content normal.        Judgment: Judgment normal.         Assessment And Plan:     1. Palpitation  Will check metabolic causes  EKG done with HR 58 - CBC - EKG 12-Lead  2. Acquired hypothyroidism  Will check thyroid levels  If her levels are low this may be a hyperthyroid response, will check levels - TSH - T4 - T3, free     Patient was given opportunity to ask questions. Patient verbalized  understanding of the plan and was able to repeat key elements of the plan. All questions were answered to their satisfaction.  , FNP   I, Arnette Felts, FNP, have reviewed all documentation for this visit. The documentation on 11/05/20 for the exam, diagnosis, procedures, and orders are all accurate and complete.   IF YOU HAVE BEEN REFERRED TO A SPECIALIST, IT MAY TAKE 1-2 WEEKS TO SCHEDULE/PROCESS THE REFERRAL. IF YOU HAVE  NOT HEARD FROM US/SPECIALIST IN TWO WEEKS, PLEASE GIVE [place] A CALL AT [phone].   THE PATIENT IS ENCOURAGED TO PRACTICE SOCIAL DISTANCING DUE TO THE COVID-19 PANDEMIC.

## 2020-11-06 LAB — CBC
Hematocrit: 37.6 % (ref 34.0–46.6)
Hemoglobin: 12.1 g/dL (ref 11.1–15.9)
MCH: 24.2 pg — ABNORMAL LOW (ref 26.6–33.0)
MCHC: 32.2 g/dL (ref 31.5–35.7)
MCV: 75 fL — ABNORMAL LOW (ref 79–97)
Platelets: 240 10*3/uL (ref 150–450)
RBC: 5.01 x10E6/uL (ref 3.77–5.28)
RDW: 15 % (ref 11.7–15.4)
WBC: 7.9 10*3/uL (ref 3.4–10.8)

## 2020-11-06 LAB — T4: T4, Total: 7.9 ug/dL (ref 4.5–12.0)

## 2020-11-06 LAB — T3, FREE: T3, Free: 1.8 pg/mL — ABNORMAL LOW (ref 2.0–4.4)

## 2020-11-06 LAB — TSH: TSH: 4.37 u[IU]/mL (ref 0.450–4.500)

## 2020-11-10 ENCOUNTER — Other Ambulatory Visit: Payer: Self-pay | Admitting: Nurse Practitioner

## 2020-11-10 DIAGNOSIS — E039 Hypothyroidism, unspecified: Secondary | ICD-10-CM

## 2020-11-10 MED ORDER — SYNTHROID 75 MCG PO TABS: ORAL_TABLET | ORAL | 1 refills | Status: DC

## 2020-11-19 ENCOUNTER — Ambulatory Visit: Payer: Medicare Other

## 2020-11-19 DIAGNOSIS — I10 Essential (primary) hypertension: Secondary | ICD-10-CM

## 2020-11-19 DIAGNOSIS — F0280 Dementia in other diseases classified elsewhere without behavioral disturbance: Secondary | ICD-10-CM

## 2020-11-19 DIAGNOSIS — G309 Alzheimer's disease, unspecified: Secondary | ICD-10-CM

## 2020-11-19 NOTE — Patient Instructions (Signed)
Social Worker Visit Information  Goals we discussed today:  Goals Addressed            This Visit's Progress   . COMPLETED: Work with SW to manage care coordination needs       Timeframe:  Long-Range Goal Priority:  Low Start Date:   2.2.22                          Expected End Date:   6.2.22                    Goal Met 4.28.22  Patient Goals/Self-Care Activities . patient will: with the help of her family  - Patient will self administer medications as prescribed Patient will attend all scheduled provider appointments Patient will call provider office for new concerns or questions Contact SW as needed          Follow Up Plan: No SW follow up planned at this time. Please contact me with future resource needs.   Daneen Schick, BSW, CDP Social Worker, Certified Dementia Practitioner [place] [facility] [place] [facility] [phone]

## 2020-11-19 NOTE — Chronic Care Management (AMB) (Signed)
Chronic Care Management    Social Work Note  11/19/2020 Name: Jordan [name] MRN: [id number] DOB: 1933/10/11  Jordan [name] is a 87 y.o. year old female who is a primary care patient of [name], [place]. The CCM team was consulted to assist the patient with chronic disease management and/or care coordination needs related to: [employer] .   Engaged with patient daughter by phone for follow up visit in response to provider referral for social work chronic care management and care coordination services.   Consent to Services:  The patient was given information about Chronic Care Management services, agreed to services, and gave verbal consent prior to initiation of services.  Please see initial visit note for detailed documentation.   Patient agreed to services and consent obtained.   Assessment: Review of patient past medical history, allergies, medications, and health status, including review of relevant consultants reports was performed today as part of a comprehensive evaluation and provision of chronic care management and care coordination services.     SDOH (Social Determinants of Health) assessments and interventions performed:  SDOH Interventions   Flowsheet Row Most Recent Value  SDOH Interventions   Food Insecurity Interventions Intervention Not Indicated  Housing Interventions Intervention Not Indicated  Transportation Interventions Intervention Not Indicated       Advanced Directives Status: Not addressed in this encounter.  CCM Care Plan  Allergies  Allergen Reactions  . Penicillins Hives    Outpatient Encounter Medications as of 11/19/2020  Medication Sig  . albuterol (VENTOLIN HFA) 108 (90 Base) MCG/ACT inhaler Inhale 1 puff into the lungs every 6 (six) hours as needed for wheezing or shortness of breath.  . ALLERGY RELIEF 10 MG tablet TAKE 1 TABLET BY MOUTH EVERY DAY  . amLODipine (NORVASC) 5 MG tablet TAKE 1 TABLET BY MOUTH EVERY DAY  . aspirin EC  81 MG tablet Take 81 mg by mouth daily. Swallow whole.  . brimonidine-timolol (COMBIGAN) 0.2-0.5 % ophthalmic solution Place 1 drop into both eyes every 12 (twelve) hours. (Patient not taking: No sig reported)  . ENBREL SURECLICK 50 MG/ML injection Inject 0.98 mLs (50 mg total) into the skin once a week.  . feeding supplement, ENSURE ENLIVE, (ENSURE ENLIVE) LIQD Take 237 mLs by mouth daily in the afternoon.  . ferrous sulfate 325 (65 FE) MG tablet Take 325 mg by mouth daily with breakfast.  . fluticasone (FLONASE) 50 MCG/ACT nasal spray INSTILL 2 SPRAYS IN EACH NOSTRIL EVERY DAY (Patient taking differently: Place 2 sprays into both nostrils daily as needed for allergies.)  . folic acid (FOLVITE) 1 MG tablet TAKE 1 TABLET(1 MG) BY MOUTH DAILY (Patient taking differently: Take 1 mg by mouth daily.)  . latanoprost (XALATAN) 0.005 % ophthalmic solution Place 1 drop into both eyes at bedtime.   [name] NAMZARIC 28-10 MG CP24 TAKE 1 CAPSULE BY MOUTH EVERY DAY  . omeprazole (PRILOSEC) 20 MG capsule TAKE 1 CAPSULE BY MOUTH EVERY DAY  . QUEtiapine (SEROQUEL) 100 MG tablet Take 1 tablet (100 mg total) by mouth at bedtime.  . simvastatin (ZOCOR) 40 MG tablet TAKE 1 TABLET BY MOUTH AT BEDTIME  . SYNTHROID 75 MCG tablet Take 1 tablet by mouth daily at breakfast on Mon - Sat, hold on Sun  . timolol (TIMOPTIC) 0.5 % ophthalmic solution 1 drop 2 (two) times daily.  . Vitamin D, Ergocalciferol, (DRISDOL) 1.25 MG (50000 UNIT) CAPS capsule Take 1 capsule (50,000 Units total) by mouth 2 (two) times a week.   No facility-administered  encounter medications on file as of 11/19/2020.    Patient Active Problem List   Diagnosis Date Noted  . Chest pain   . Hypotension 03/24/2020  . Alzheimer's disease ([place]) 08/13/2019  . Essential hypertension 08/13/2018  . Acquired hypothyroidism 08/13/2018  . Rash and nonspecific skin eruption 08/13/2018  . Memory loss 04/12/2017  . Depression 04/12/2017  . Rheumatoid arthritis  involving multiple sites with positive rheumatoid factor ([place]) 08/17/2016  . High risk medication use 08/17/2016  . H/O total knee replacement, bilateral 08/17/2016  . Spondylosis of lumbar region without myelopathy or radiculopathy 08/17/2016  . History of glaucoma/ patient is legally blind 08/17/2016  . History of gastroesophageal reflux (GERD) 08/17/2016  . History of diverticulosis 08/17/2016  . History of hypothyroidism 08/17/2016  . history of asthmatic bronchitis 08/17/2016  . History of memory loss 08/17/2016  . History of atherosclerosis 08/17/2016    Conditions to be addressed/monitored: HTN and Alzheimer's Disease; ADL IADL limitations  Care Plan : [facility] Social Work Care Plan  Updates made by Daneen Schick since 11/19/2020 12:00 AM  Completed 11/19/2020  Problem: Barriers to Treatment Resolved 11/19/2020    Long-Range Goal: Collaborate with RN Care Manager to assist with care coordination needs Completed 11/19/2020  Start Date: 08/26/2020  Expected End Date: 12/24/2020  Priority: Low  Note:   Current Barriers:  . Chronic disease management support and education needs related to HTN and Alzheimer's Disease   . ADL IADL limitations and Limited access to caregiver  Social Worker Clinical Goal(s):  [name] Over the next 120 days, patient will work with SW to identify and address any acute and/or chronic care coordination needs related to the self health management of HTN and Alzheimer's Disease    CCM SW Interventions:  . Inter-disciplinary care team collaboration (see longitudinal plan of care) . Collaboration with [name], FNP regarding development and update of comprehensive plan of care as evidenced by provider attestation and co-signature . Successful outbound call placed to the patient daughter to assess care coordination needs- no acute needs identified at this time . Conducted SDoH screen - no challenges identified . Discussed case closure with SW while patient remains  actively involved with  RN Case Manager  to address care management needs . Encouraged [name] contact SW as needed with future care coordination needs  Patient Goals/Self-Care Activities .  patient will: with the help of her family  - Patient will self administer medications as prescribed Patient will attend all scheduled provider appointments Patient will call provider office for new concerns or questions Contact SW as needed   Follow Up Plan:  No follow up planned - SW goals met       Follow Up Plan: No SW follow up planned at this time. The patient will remain active with RN Care Manager. The patient is encouraged to contact SW as needed with future care coordination needs.      Daneen Schick, BSW, CDP Social Worker, Certified Dementia Practitioner [place] [facility] [place] [facility] [phone]  Total time spent performing care coordination and/or care management activities with the patient by phone or face to face = 15 minutes.

## 2020-11-26 ENCOUNTER — Ambulatory Visit: Payer: Medicare Other | Attending: Occupational Therapy | Admitting: Occupational Therapy

## 2020-11-26 ENCOUNTER — Other Ambulatory Visit: Payer: Self-pay | Admitting: Nurse Practitioner

## 2020-11-26 DIAGNOSIS — I10 Essential (primary) hypertension: Secondary | ICD-10-CM

## 2020-12-14 ENCOUNTER — Other Ambulatory Visit: Payer: Self-pay | Admitting: Rheumatology

## 2020-12-14 NOTE — Telephone Encounter (Signed)
Next Visit: 01/11/2021  Last Visit: 08/06/2020  Last Fill: 09/28/2020  DX: Rheumatoid arthritis involving multiple sites with positive rheumatoid factor   Current Dose per office note 08/06/2020, Enbrel Sureclick 50 mg sq injections every 7 days.  Labs: 10/01/2020, RBC 5.35, MCV 77, MCH 24.3,   TB Gold: 06/11/2020, negative  Okay to refill Enbrel?

## 2020-12-16 DIAGNOSIS — H401113 Primary open-angle glaucoma, right eye, severe stage: Secondary | ICD-10-CM | POA: Diagnosis not present

## 2020-12-16 DIAGNOSIS — H44512 Absolute glaucoma, left eye: Secondary | ICD-10-CM | POA: Diagnosis not present

## 2020-12-16 DIAGNOSIS — H5461 Unqualified visual loss, right eye, normal vision left eye: Secondary | ICD-10-CM | POA: Diagnosis not present

## 2020-12-17 DIAGNOSIS — H44512 Absolute glaucoma, left eye: Secondary | ICD-10-CM | POA: Diagnosis not present

## 2020-12-17 DIAGNOSIS — H401113 Primary open-angle glaucoma, right eye, severe stage: Secondary | ICD-10-CM | POA: Diagnosis not present

## 2020-12-22 ENCOUNTER — Other Ambulatory Visit: Payer: Self-pay | Admitting: Nurse Practitioner

## 2020-12-24 DIAGNOSIS — Z049 Encounter for examination and observation for unspecified reason: Secondary | ICD-10-CM | POA: Diagnosis not present

## 2020-12-28 NOTE — Progress Notes (Deleted)
Office Visit Note  Patient: Jordan [name]             Date of Birth: 07-05-1934           MRN: [id number]             PCP: Arnette Felts, FNP Referring: Arnette Felts, FNP Visit Date: 01/11/2021 Occupation: @GUAROCC @  Subjective:  No chief complaint on file.   History of Present Illness: Jordan [name] is a 87 y.o. female ***   Activities of Daily Living:  Patient reports morning stiffness for *** {minute/hour:19697}.   Patient {ACTIONS;DENIES/REPORTS:21021675::"Denies"} nocturnal pain.  Difficulty dressing/grooming: {ACTIONS;DENIES/REPORTS:21021675::"Denies"} Difficulty climbing stairs: {ACTIONS;DENIES/REPORTS:21021675::"Denies"} Difficulty getting out of chair: {ACTIONS;DENIES/REPORTS:21021675::"Denies"} Difficulty using hands for taps, buttons, cutlery, and/or writing: {ACTIONS;DENIES/REPORTS:21021675::"Denies"}  No Rheumatology ROS completed.   PMFS History:  Patient Active Problem List   Diagnosis Date Noted  . Chest pain   . Hypotension 03/24/2020  . Alzheimer's disease (HCC) 08/13/2019  . Essential hypertension 08/13/2018  . Acquired hypothyroidism 08/13/2018  . Rash and nonspecific skin eruption 08/13/2018  . Memory loss 04/12/2017  . Depression 04/12/2017  . Rheumatoid arthritis involving multiple sites with positive rheumatoid factor (HCC) 08/17/2016  . High risk medication use 08/17/2016  . H/O total knee replacement, bilateral 08/17/2016  . Spondylosis of lumbar region without myelopathy or radiculopathy 08/17/2016  . History of glaucoma/ patient is legally blind 08/17/2016  . History of gastroesophageal reflux (GERD) 08/17/2016  . History of diverticulosis 08/17/2016  . History of hypothyroidism 08/17/2016  . history of asthmatic bronchitis 08/17/2016  . History of memory loss 08/17/2016  . History of atherosclerosis 08/17/2016    Past Medical History:  Diagnosis Date  . Allergy   . Anxiety    per pt's daughter  . Arthritis   . Asthma    pt  reported  . Depression   . GERD (gastroesophageal reflux disease)   . Hearing deficit L  . High cholesterol   . Hypertension   . Osteoporosis   . Rheumatoid arthritis (HCC)   . Stroke [facility])     No family history on file. Past Surgical History:  Procedure Laterality Date  . ABDOMINAL HYSTERECTOMY    . KNEE ARTHROPLASTY    . REPLACEMENT TOTAL KNEE BILATERAL Bilateral   . WRIST SURGERY Left    Social History   Social History Narrative   Lives at home with grandson.   Right-handed.   2-3 cups coffee per day.   Immunization History  Administered Date(s) Administered  . Fluad Quad(high Dose 65+) 04/06/2020  . Moderna Sars-Covid-2 Vaccination 09/26/2019, 10/24/2019     Objective: Vital Signs: There were no vitals taken for this visit.   Physical Exam   Musculoskeletal Exam: ***  CDAI Exam: CDAI Score: -- Patient Global: --; Provider Global: -- Swollen: --; Tender: -- Joint Exam 01/11/2021   No joint exam has been documented for this visit   There is currently no information documented on the homunculus. Go to the Rheumatology activity and complete the homunculus joint exam.  Investigation: No additional findings.  Imaging: No results found.  Recent Labs: Lab Results  Component Value Date   WBC 7.9 11/05/2020   HGB 12.1 11/05/2020   PLT 240 11/05/2020   NA 140 09/28/2020   K 4.6 09/28/2020   CL 102 09/28/2020   CO2 24 09/28/2020   GLUCOSE 76 09/28/2020   BUN 14 09/28/2020   CREATININE 0.78 09/28/2020   BILITOT <0.2 09/28/2020   ALKPHOS 63 09/28/2020   AST 14  09/28/2020   ALT 8 09/28/2020   PROT 7.1 09/28/2020   ALBUMIN 4.2 09/28/2020   CALCIUM 9.5 09/28/2020   GFRAA 89 06/11/2020   QFTBGOLDPLUS NEGATIVE 06/11/2020    Speciality Comments: No specialty comments available.  Procedures:  No procedures performed Allergies: Penicillins   Assessment / Plan:     Visit Diagnoses: No diagnosis found.  Orders: No orders of the defined types were  placed in this encounter.  No orders of the defined types were placed in this encounter.   Face-to-face time spent with patient was *** minutes. Greater than 50% of time was spent in counseling and coordination of care.  Follow-Up Instructions: No follow-ups on file.   Ellen Henri, CMA  Note - This record has been created using Animal nutritionist.  Chart creation errors have been sought, but may not always  have been located. Such creation errors do not reflect on  the standard of medical care.

## 2020-12-29 ENCOUNTER — Ambulatory Visit: Payer: Medicare Other | Admitting: Nurse Practitioner

## 2020-12-30 ENCOUNTER — Other Ambulatory Visit: Payer: Self-pay

## 2020-12-30 ENCOUNTER — Ambulatory Visit (INDEPENDENT_AMBULATORY_CARE_PROVIDER_SITE_OTHER): Payer: Medicare Other | Admitting: Nurse Practitioner

## 2020-12-30 ENCOUNTER — Encounter: Payer: Self-pay | Admitting: Nurse Practitioner

## 2020-12-30 VITALS — BP 122/60 | HR 68 | Temp 98.6°F | Ht 61.0 in | Wt 104.0 lb

## 2020-12-30 DIAGNOSIS — I10 Essential (primary) hypertension: Secondary | ICD-10-CM | POA: Diagnosis not present

## 2020-12-30 DIAGNOSIS — E559 Vitamin D deficiency, unspecified: Secondary | ICD-10-CM | POA: Diagnosis not present

## 2020-12-30 DIAGNOSIS — E039 Hypothyroidism, unspecified: Secondary | ICD-10-CM

## 2020-12-30 DIAGNOSIS — H9312 Tinnitus, left ear: Secondary | ICD-10-CM

## 2020-12-30 DIAGNOSIS — G4700 Insomnia, unspecified: Secondary | ICD-10-CM | POA: Diagnosis not present

## 2020-12-30 NOTE — Progress Notes (Signed)
This visit occurred during the SARS-CoV-2 public health emergency.  Safety protocols were in place, including screening questions prior to the visit, additional usage of staff PPE, and extensive cleaning of exam room while observing appropriate contact time as indicated for disinfecting solutions.  Subjective:     Patient ID: Jordan [name] , female    DOB: Aug 17, 1933 , 87 y.o.   MRN: [id number]   Chief Complaint  Patient presents with  . Hypertension    HPI   Patient is here today for a follow up for htn and vit d. She is here today with her niece, Burna Mortimer. She has complaints for ringing in the ear. She is also having some trouble with sleeping. She is also complaining about her eye. She is history of glaucoma/legally blind She saw eye doctor recently.  Have advised her to follow up with them again. Otherwise she is doing well. No other concerns.    Hypertension This is a chronic problem. The current episode started more than 1 year ago. The problem is unchanged. The problem is controlled. Pertinent negatives include no anxiety, chest pain, headaches, palpitations or shortness of breath. There are no associated agents to hypertension. Risk factors for coronary artery disease include sedentary lifestyle. Past treatments include calcium channel blockers and diuretics. There are no compliance problems.  There is no history of angina. There is no history of chronic renal disease.     Past Medical History:  Diagnosis Date  . Allergy   . Anxiety    per pt's daughter  . Arthritis   . Asthma    pt reported  . Depression   . GERD (gastroesophageal reflux disease)   . Hearing deficit L  . High cholesterol   . Hypertension   . Osteoporosis   . Rheumatoid arthritis (HCC)   . Stroke [facility])      No family history on file.   Current Outpatient Medications:  .  albuterol (VENTOLIN HFA) 108 (90 Base) MCG/ACT inhaler, Inhale 1 puff into the lungs every 6 (six) hours as needed for wheezing or  shortness of breath., Disp: 6.7 g, Rfl: 5 .  ALLERGY RELIEF 10 MG tablet, TAKE 1 TABLET BY MOUTH EVERY DAY, Disp: 30 tablet, Rfl: 1 .  amLODipine (NORVASC) 5 MG tablet, TAKE 1 TABLET BY MOUTH EVERY DAY, Disp: 30 tablet, Rfl: 2 .  aspirin EC 81 MG tablet, Take 81 mg by mouth daily. Swallow whole., Disp: , Rfl:  .  ENBREL SURECLICK 50 MG/ML injection, Inject 0.98 mLs (50 mg total) into the skin once a week., Disp: 12 mL, Rfl: 0 .  feeding supplement, ENSURE ENLIVE, (ENSURE ENLIVE) LIQD, Take 237 mLs by mouth daily in the afternoon., Disp: , Rfl:  .  ferrous sulfate 325 (65 FE) MG tablet, Take 325 mg by mouth daily with breakfast., Disp: , Rfl:  .  fluticasone (FLONASE) 50 MCG/ACT nasal spray, INSTILL 2 SPRAYS IN EACH NOSTRIL EVERY DAY (Patient taking differently: Place 2 sprays into both nostrils daily as needed for allergies.), Disp: 16 g, Rfl: 1 .  folic acid (FOLVITE) 1 MG tablet, TAKE 1 TABLET(1 MG) BY MOUTH DAILY (Patient taking differently: Take 1 mg by mouth daily.), Disp: 90 tablet, Rfl: 0 .  latanoprost (XALATAN) 0.005 % ophthalmic solution, Place 1 drop into both eyes at bedtime. , Disp: , Rfl:  .  NAMZARIC 28-10 MG CP24, TAKE 1 CAPSULE BY MOUTH EVERY DAY, Disp: 90 capsule, Rfl: 1 .  omeprazole (PRILOSEC) 20 MG capsule, TAKE 1  CAPSULE BY MOUTH EVERY DAY, Disp: 90 capsule, Rfl: 1 .  QUEtiapine (SEROQUEL) 100 MG tablet, Take 1 tablet (100 mg total) by mouth at bedtime., Disp: 90 tablet, Rfl: 1 .  simvastatin (ZOCOR) 40 MG tablet, TAKE 1 TABLET BY MOUTH AT BEDTIME, Disp: 90 tablet, Rfl: 1 .  SYNTHROID 75 MCG tablet, Take 1 tablet by mouth daily at breakfast on Mon - Sat, hold on Sun, Disp: 90 tablet, Rfl: 1 .  timolol (TIMOPTIC) 0.5 % ophthalmic solution, 1 drop 2 (two) times daily., Disp: , Rfl:  .  Vitamin D, Ergocalciferol, (DRISDOL) 1.25 MG (50000 UNIT) CAPS capsule, Take 1 capsule (50,000 Units total) by mouth 2 (two) times a week., Disp: 24 capsule, Rfl: 1   Allergies  Allergen  Reactions  . Penicillins Hives     Review of Systems  Constitutional: Negative.  Negative for chills and fatigue.  HENT: Negative for congestion, ear discharge, ear pain, rhinorrhea, sinus pressure, sinus pain and sneezing.        Ringing in her left ear  Eyes: Positive for redness.       Hx of glaucoma and legally blind. Follow up with eye doctor.   Respiratory: Negative.  Negative for shortness of breath.   Cardiovascular: Negative.  Negative for chest pain and palpitations.  Gastrointestinal: Negative.  Negative for diarrhea.  Musculoskeletal: Negative for arthralgias and myalgias.  Neurological: Negative.  Negative for numbness and headaches.     Today's Vitals   12/30/20 0944  BP: 122/60  Pulse: 68  Temp: 98.6 F (37 C)  TempSrc: Oral  Weight: 104 lb (47.2 kg)  Height: 5\' 1"  (1.549 m)   Body mass index is 19.65 kg/m.  Wt Readings from Last 3 Encounters:  12/30/20 104 lb (47.2 kg)  11/05/20 101 lb (45.8 kg)  09/28/20 97 lb (44 kg)    Objective:  Physical Exam Constitutional:      Appearance: Normal appearance.  HENT:     Head: Normocephalic and atraumatic.     Left Ear: Tympanic membrane, ear canal and external ear normal. There is no impacted cerumen.     Nose: No congestion or rhinorrhea.  Eyes:     General: Lids are normal.  Cardiovascular:     Rate and Rhythm: Normal rate and regular rhythm.     Pulses: Normal pulses.     Heart sounds: No murmur heard.   Pulmonary:     Effort: Pulmonary effort is normal. No respiratory distress.     Breath sounds: Normal breath sounds. No wheezing.  Skin:    General: Skin is warm and dry.     Capillary Refill: Capillary refill takes less than 2 seconds.  Neurological:     Mental Status: She is alert.         Assessment And Plan:     1. Essential hypertension -Chronic, stable, continue meds  -Bp today normal at 122/60 -Limit the intake of processed foods and salt intake. You should increase your intake of  green vegetables and fruits. Limit the use of alcohol. Limit fast foods and fried foods. Avoid high fatty saturated and trans fat foods. Keep yourself hydrated with drinking water. Avoid red meats. Eat lean meats instead. Exercise for atleast 30-45 min for atleast 4-5 times a week.   2. Vitamin D deficiency -Will check and supplement if needed. Advised patient to spend atleast 15 min. Daily in sunlight.  - Vitamin D (25 hydroxy)  3. Acquired hypothyroidism -Patient was advised to stop taking  her medication on Sunday in April. Will recheck her TSH levels today to make sure they are within normal range. And if needed will adjust dosage.  - TSH + free T4 - T3, free  4. Insomnia, unspecified type -Advised patient to take melatonin OTC as needed.   5. Ringing in left ear -Will refer patient to ENT for further evaluation.  - Ambulatory referral to ENT   The patient was encouraged to call or send a message through MyChart for any questions or concerns.   Follow up: if symptoms persist or do not get better.   Patient was given opportunity to ask questions. Patient verbalized understanding of the plan and was able to repeat key elements of the plan. All questions were answered to their satisfaction.  Raman Abie Killian, DNP   I, Raman Marvell Stavola have reviewed all documentation for this visit. The documentation on 12/30/20 for the exam, diagnosis, procedures, and orders are all accurate and complete.    IF YOU HAVE BEEN REFERRED TO A SPECIALIST, IT MAY TAKE 1-2 WEEKS TO SCHEDULE/PROCESS THE REFERRAL. IF YOU HAVE NOT HEARD FROM US/SPECIALIST IN TWO WEEKS, PLEASE GIVE [place] A CALL AT [phone].   THE PATIENT IS ENCOURAGED TO PRACTICE SOCIAL DISTANCING DUE TO THE COVID-19 PANDEMIC.

## 2020-12-31 LAB — VITAMIN D 25 HYDROXY (VIT D DEFICIENCY, FRACTURES): Vit D, 25-Hydroxy: 74.7 ng/mL (ref 30.0–100.0)

## 2021-01-11 ENCOUNTER — Ambulatory Visit: Payer: Medicare Other | Admitting: Rheumatology

## 2021-01-11 DIAGNOSIS — I10 Essential (primary) hypertension: Secondary | ICD-10-CM

## 2021-01-11 DIAGNOSIS — Z79899 Other long term (current) drug therapy: Secondary | ICD-10-CM

## 2021-01-11 DIAGNOSIS — M5136 Other intervertebral disc degeneration, lumbar region: Secondary | ICD-10-CM

## 2021-01-11 DIAGNOSIS — M0579 Rheumatoid arthritis with rheumatoid factor of multiple sites without organ or systems involvement: Secondary | ICD-10-CM

## 2021-01-11 DIAGNOSIS — Z8719 Personal history of other diseases of the digestive system: Secondary | ICD-10-CM

## 2021-01-11 DIAGNOSIS — Z8709 Personal history of other diseases of the respiratory system: Secondary | ICD-10-CM

## 2021-01-11 DIAGNOSIS — Z8669 Personal history of other diseases of the nervous system and sense organs: Secondary | ICD-10-CM

## 2021-01-11 DIAGNOSIS — Z8639 Personal history of other endocrine, nutritional and metabolic disease: Secondary | ICD-10-CM

## 2021-01-11 DIAGNOSIS — Z8679 Personal history of other diseases of the circulatory system: Secondary | ICD-10-CM

## 2021-01-11 DIAGNOSIS — M810 Age-related osteoporosis without current pathological fracture: Secondary | ICD-10-CM

## 2021-01-11 DIAGNOSIS — Z96653 Presence of artificial knee joint, bilateral: Secondary | ICD-10-CM

## 2021-01-11 DIAGNOSIS — Z87898 Personal history of other specified conditions: Secondary | ICD-10-CM

## 2021-01-11 DIAGNOSIS — H5711 Ocular pain, right eye: Secondary | ICD-10-CM

## 2021-01-18 ENCOUNTER — Telehealth: Payer: Medicare Other

## 2021-01-20 ENCOUNTER — Ambulatory Visit: Payer: Medicare Other | Admitting: Nurse Practitioner

## 2021-01-20 ENCOUNTER — Ambulatory Visit: Payer: Medicare Other

## 2021-01-21 ENCOUNTER — Other Ambulatory Visit: Payer: Self-pay | Admitting: Nurse Practitioner

## 2021-01-21 DIAGNOSIS — E559 Vitamin D deficiency, unspecified: Secondary | ICD-10-CM

## 2021-01-22 ENCOUNTER — Ambulatory Visit (INDEPENDENT_AMBULATORY_CARE_PROVIDER_SITE_OTHER): Payer: Medicare Other

## 2021-01-22 ENCOUNTER — Telehealth: Payer: Medicare Other

## 2021-01-22 DIAGNOSIS — M0579 Rheumatoid arthritis with rheumatoid factor of multiple sites without organ or systems involvement: Secondary | ICD-10-CM

## 2021-01-22 DIAGNOSIS — E039 Hypothyroidism, unspecified: Secondary | ICD-10-CM

## 2021-01-22 DIAGNOSIS — I10 Essential (primary) hypertension: Secondary | ICD-10-CM

## 2021-01-22 DIAGNOSIS — G309 Alzheimer's disease, unspecified: Secondary | ICD-10-CM

## 2021-01-22 DIAGNOSIS — E559 Vitamin D deficiency, unspecified: Secondary | ICD-10-CM

## 2021-01-22 DIAGNOSIS — F0280 Dementia in other diseases classified elsewhere without behavioral disturbance: Secondary | ICD-10-CM

## 2021-01-27 ENCOUNTER — Other Ambulatory Visit: Payer: Self-pay

## 2021-01-27 MED ORDER — SIMVASTATIN 20 MG PO TABS: 20.0000 mg | ORAL_TABLET | Freq: Every evening | ORAL | 1 refills | Status: DC

## 2021-02-01 NOTE — Chronic Care Management (AMB) (Signed)
Chronic Care Management   CCM RN Visit Note  01/22/2021 Name: Jordan [name] MRN: [id number] DOB: 1933-09-14  Subjective: Jordan [name] is a 87 y.o. year old female who is a primary care patient of Jordan [name], [place]. The care management team was consulted for assistance with disease management and care coordination needs.    Engaged with patient by telephone for follow up visit in response to provider referral for case management and/or care coordination services.   Consent to Services:  The patient was given information about Chronic Care Management services, agreed to services, and gave verbal consent prior to initiation of services.  Please see initial visit note for detailed documentation.   Patient agreed to services and verbal consent obtained.   Assessment: Review of patient past medical history, allergies, medications, health status, including review of consultants reports, laboratory and other test data, was performed as part of comprehensive evaluation and provision of chronic care management services.   SDOH (Social Determinants of Health) assessments and interventions performed:  Yes, no acute needs   CCM Care Plan  Allergies  Allergen Reactions   Penicillins Hives    Outpatient Encounter Medications as of 01/22/2021  Medication Sig   albuterol (VENTOLIN HFA) 108 (90 Base) MCG/ACT inhaler Inhale 1 puff into the lungs every 6 (six) hours as needed for wheezing or shortness of breath.   ALLERGY RELIEF 10 MG tablet TAKE 1 TABLET BY MOUTH EVERY DAY   amLODipine (NORVASC) 5 MG tablet TAKE 1 TABLET BY MOUTH EVERY DAY   aspirin EC 81 MG tablet Take 81 mg by mouth daily. Swallow whole.   ENBREL SURECLICK 50 MG/ML injection Inject 0.98 mLs (50 mg total) into the skin once a week.   feeding supplement, ENSURE ENLIVE, (ENSURE ENLIVE) LIQD Take 237 mLs by mouth daily in the afternoon.   ferrous sulfate 325 (65 FE) MG tablet Take 325 mg by mouth daily with breakfast.    fluticasone (FLONASE) 50 MCG/ACT nasal spray INSTILL 2 SPRAYS IN EACH NOSTRIL EVERY DAY (Patient taking differently: Place 2 sprays into both nostrils daily as needed for allergies.)   folic acid (FOLVITE) 1 MG tablet TAKE 1 TABLET(1 MG) BY MOUTH DAILY (Patient taking differently: Take 1 mg by mouth daily.)   latanoprost (XALATAN) 0.005 % ophthalmic solution Place 1 drop into both eyes at bedtime.    NAMZARIC 28-10 MG CP24 TAKE 1 CAPSULE BY MOUTH EVERY DAY   omeprazole (PRILOSEC) 20 MG capsule TAKE 1 CAPSULE BY MOUTH EVERY DAY   QUEtiapine (SEROQUEL) 100 MG tablet Take 1 tablet (100 mg total) by mouth at bedtime.   SYNTHROID 75 MCG tablet Take 1 tablet by mouth daily at breakfast on Mon - Sat, hold on Sun   timolol (TIMOPTIC) 0.5 % ophthalmic solution 1 drop 2 (two) times daily.   Vitamin D, Ergocalciferol, (DRISDOL) 1.25 MG (50000 UNIT) CAPS capsule TAKE 1 CAPSULE BY MOUTH TWICE A WEEK   [DISCONTINUED] simvastatin (ZOCOR) 40 MG tablet TAKE 1 TABLET BY MOUTH AT BEDTIME   No facility-administered encounter medications on file as of 01/22/2021.    Patient Active Problem List   Diagnosis Date Noted   Chest pain    Hypotension 03/24/2020   Alzheimer's disease ([place]) 08/13/2019   Essential hypertension 08/13/2018   Acquired hypothyroidism 08/13/2018   Rash and nonspecific skin eruption 08/13/2018   Memory loss 04/12/2017   Depression 04/12/2017   Rheumatoid arthritis involving multiple sites with positive rheumatoid factor ([place]) 08/17/2016   High risk medication use 08/17/2016  H/O total knee replacement, bilateral 08/17/2016   Spondylosis of lumbar region without myelopathy or radiculopathy 08/17/2016   History of glaucoma/ patient is legally blind 08/17/2016   History of gastroesophageal reflux (GERD) 08/17/2016   History of diverticulosis 08/17/2016   History of hypothyroidism 08/17/2016   history of asthmatic bronchitis 08/17/2016   History of memory loss 08/17/2016   History of  atherosclerosis 08/17/2016    Conditions to be addressed/monitored: HTN, Rheumatoid arthritis, Vitamin D deficiency, Alzheimer's disease, Acquired Hypothyroidism  Care Plan : Dementia (Adult)  Updates made by Lynne Logan, RN since 01/22/2021 12:00 AM  Completed 02/01/2021   Problem: Caregiver Stress Resolved 01/22/2021  Priority: High     Long-Range Goal: Caregiver Coping Optimized Completed 01/22/2021  Start Date: 09/30/2020  Expected End Date: 04/02/2021  Recent Progress: On track  Priority: High  Note:   Current Barriers:  Ineffective Self Health Maintenance Unable to self administer medications as prescribed Unable to perform IADLs independently Blindness due to Glaucoma  Currently UNABLE TO independently self manage needs related to chronic health conditions.  Knowledge Deficits related to short term plan for care coordination needs and long term plans for chronic disease management needs Nurse Case Manager Clinical Goal(s):  Over the next 180 days, patient will work with care management team to address care coordination and chronic disease management needs related to Disease Management Educational Needs Care Coordination Medication Management and Education Caregiver Stress support Dementia and Caregiver Support   Interventions:  01/22/21 completed successful outbound call with daughter [name] Collaboration with Jordan Brine FNP regarding development and update of comprehensive plan of care as evidenced by provider attestation and co-signature Inter-disciplinary care team collaboration (see longitudinal plan of care) Review of patient status, including review of consultant's reports, relevant laboratory and other test results, and medications completed. Reviewed medications with patient and discussed importance of medication adherence Validated daughter's feelings about dementia, as well as the impact on the caregiver and extended family; provide education and support to align with  needs and stage of dementia.  Acknowledged feelings of grief associated with dementia such as loss of relationship, recreational activities, future with patient and loss associated with out-of-home placement.  Encouraged verbalization of feelings without ruminating Determined daughter feels her ability to care for her mother is stable at this time and she continues to stay engaged with the embedded BSW for support and resources  Discussed plans with patient for ongoing care management follow up and provided patient with direct contact information for care management team Patient Goals/Self Care Activities: - make a list of family or friends that I can call  - Assist patient with taking all medications as prescribed for this condition - keep all MD appointments  - notify MD of new or worsening symptoms promptly  Follow Up Plan: Follow up with provider re: dementia as directed       Problem: Cognitive Function Resolved 01/22/2021  Priority: High     Long-Range Goal: Optimal Cognitive Function Completed 01/22/2021  Start Date: 09/30/2020  Expected End Date: 04/02/2021  Recent Progress: On track  Priority: High  Note:   Current Barriers:  Ineffective Self Health Maintenance Unable to self administer medications as prescribed Unable to perform IADLs independently Blindness due to Glaucoma  Currently UNABLE TO independently self manage needs related to chronic health conditions.  Knowledge Deficits related to short term plan for care coordination needs and long term plans for chronic disease management needs Nurse Case Manager Clinical Goal(s):  Over the next  180 days, patient will work with care management team to address care coordination and chronic disease management needs related to Disease Management Educational Needs Care Coordination Medication Management and Education Psychosocial Support Dementia and Caregiver Support   Interventions:  01/22/21 completed successful outbound call with  daughter Milinda Cave with Jordan Brine FNP regarding development and update of comprehensive plan of care as evidenced by provider attestation and co-signature Inter-disciplinary care team collaboration (see longitudinal plan of care) Provided education to patient about basic disease process related to Dementia Review of patient status, including review of consultant's reports, relevant laboratory and other test results, and medications completed. Reviewed medications with patient and discussed importance of medication adherence Encouraged social relationships and physical activity based on ability Promoted strategies that will preserve the patient's abilities; focused on strengths and quality of life optimization.  Discussed plans with patient for ongoing care management follow up and provided patient with direct contact information for care management team Patient Goals/Self Care Activities: - make a list of family or friends that I can call  - take all medications as prescribed for this condition - keep all MD appointments  - notify MD of new or worsening symptoms promptly  Follow Up Plan: Follow up with provider re: dementia as directed - Goal Met     Care Plan : Hypertension (Adult)  Updates made by Lynne Logan, RN since 01/22/2021 12:00 AM     Problem: Disease Progression (Hypertension)   Priority: High     Long-Range Goal: Disease Progression Prevented or Minimized   Start Date: 09/30/2020  Expected End Date: 09/30/2021  Recent Progress: On track  Priority: High  Note:   Objective:  Last practice recorded BP readings:  BP Readings from Last 3 Encounters:  12/30/20 122/60  11/05/20 136/64  09/28/20 122/72   Most recent eGFR/CrCl:  Lab Results  Component Value Date   EGFR 75 09/28/2020    No components found for: CRCL Current Barriers:  Knowledge Deficits related to basic understanding of hypertension pathophysiology and self care management Knowledge  Deficits related to understanding of medications prescribed for management of hypertension Cognitive Deficits Unable to self administer medications as prescribed Unable to perform IADLs independently Case Manager Clinical Goal(s):  Patient will demonstrate improved adherence to prescribed treatment plan for hypertension as evidenced by taking all medications as prescribed, monitoring and recording blood pressure as directed, adhering to low sodium/DASH diet Interventions:  01/22/21 completed successful outbound call with daughter [name]  Evaluation of current treatment plan related to hypertension self management and patient's adherence to plan as established by provider. Provided education to patient re: stroke prevention, s/s of heart attack and stroke, DASH diet, complications of uncontrolled blood pressure Reviewed medications with patient and discussed importance of compliance Educated on exercise recommendations Reinforced importance in reduction in sodium intake by avoiding the addition of salt to prepared foods and limiting processed meats, canned soup, frozen meals and salty snacks  Discussed plans with patient for ongoing care management follow up and provided patient with direct contact information for care management team Patient Goals/Self-Care Activities - Attend all scheduled provider appointments - Call provider office for new concerns, questions, or BP outside discussed parameters - Check BP and records as discussed - Follow a low sodium diet/DASH diet - check blood pressure 3 times per week - write blood pressure results in a log or diary   Follow Up Plan: Telephone follow up appointment with care management team member scheduled for: 04/16/21  Care Plan : Vitamin D deficiency  Updates made by Lynne Logan, RN since 01/22/2021 12:00 AM     Problem: Vitamin D deficiency   Priority: High     Long-Range Goal: Vitamin D deficinecy improved and or resolved   Start  Date: 09/30/2020  Expected End Date: 09/30/2021  Recent Progress: On track  Priority: High  Note:   Current Barriers:  Ineffective Self Health Maintenance Blindness due to Glaucoma  Clinical Goal(s):  Collaboration with Jordan Brine, FNP regarding development and update of comprehensive plan of care as evidenced by provider attestation and co-signature Inter-disciplinary care team collaboration (see longitudinal plan of care) patient will work with care management team to address care coordination and chronic disease management needs related to Disease Management Educational Needs Care Coordination Medication Management and Education Psychosocial Support   Interventions:  01/22/21 completed successful call with daughter [name]  Collaboration with Jordan Brine, FNP regarding development and update of comprehensive plan of care as evidenced by provider attestation       and co-signature Inter-disciplinary care team collaboration (see longitudinal plan of care) Evaluation of current treatment plan related to  Vitamin D deficiency , self-management and patient's adherence to plan as established by provider. Provided education to patient about basic disease process related to Vitamin D deficiency Review of patient status, including review of consultant's reports, relevant laboratory and other test results, and medications completed. Reviewed medications with patient and discussed importance of medication adherence Collaborated with PCP provider re: Vitamin D recheck is needed, office will call to schedule  Discussed plans with patient for ongoing care management follow up and provided patient with direct contact information for care management team Self Care Activities:  - follow up with PCP as scheduled for Vitamin d blood test - take your Vitamin D supplement as directed by PCP - Eat Vitamin rich foods including,  - fatty fish - egg yolks - Fortified cereals - milk and juices with added  vitamin D - yogurt - beef liver - Spend at least 15 minutes each day in the sun light or as much as possible     Patient Goals: To improve or resolve Vitamin D deficiency   Next Scheduled Follow up date: 04/16/21    Plan:Telephone follow up appointment with care management team member scheduled for:  04/16/21  Barb Merino, RN, BSN, CCM Care Management Coordinator [place] [facility]  Direct Phone: [phone]

## 2021-02-01 NOTE — Patient Instructions (Signed)
Goals Addressed      COMPLETED: Care for the Caregiver-Dementia       Timeframe:  Long-Range Goal Priority:  High Start Date: 09/30/20                          Expected End Date: 04/02/21                    Follow Up Date: 08/14/20    - make a list of family or friends that I can call  - Assist patient with taking all medications as prescribed for this condition - keep all MD appointments  - notify MD of new or worsening symptoms promptly   Why is this important?   People who care for a person with dementia often feel stress.  Too much stress can be harmful to both of you.  You may feel depressed, exhausted, have trouble sleeping or have health problems.  You need to take care of yourself and reach out for help when you need it.  There are some things that can be done to keep the quality of your life satisfying.     Notes:       Evaluate and treat Vitamin D deficiency   On track    Timeframe:  Long-Range Goal Priority:  High Start Date: 09/30/20                           Expected End Date: 09/30/21  Next Scheduled Follow up: 04/16/21  Patient Goals/Self-Care Activities:  - follow up with PCP as scheduled for Vitamin d blood test - take your Vitamin D supplement as directed by PCP - Eat Vitamin rich foods including,  - fatty fish - egg yolks - Fortified cereals - milk and juices with added vitamin D - yogurt - beef liver - Spend at least 15 minutes each day in the sun light or as much as possible                           Track and Manage My Blood Pressure-Hypertension   On track    Timeframe:  Long-Range Goal Priority:  High Start Date: 09/30/20                          Expected End Date: 09/30/21                      Follow Up Date:  04/16/21   - check blood pressure 3 times per week - write blood pressure results in a log or diary  - Attends all scheduled provider appointments - Calls provider office for new concerns, questions, or BP outside discussed parameters -  Checks BP and records as discussed - Follows a low sodium diet/DASH diet   Why is this important?   You won't feel high blood pressure, but it can still hurt your blood vessels.  High blood pressure can cause heart or kidney problems. It can also cause a stroke.  Making lifestyle changes like losing a Randle Shatzer weight or eating less salt will help.  Checking your blood pressure at home and at different times of the day can help to control blood pressure.  If the doctor prescribes medicine remember to take it the way the doctor ordered.  Call the office if you cannot afford the medicine  or if there are questions about it.     Notes:

## 2021-02-04 ENCOUNTER — Ambulatory Visit (INDEPENDENT_AMBULATORY_CARE_PROVIDER_SITE_OTHER): Payer: Medicare Other

## 2021-02-04 VITALS — Ht 60.5 in | Wt 108.0 lb

## 2021-02-04 DIAGNOSIS — Z0000 Encounter for general adult medical examination without abnormal findings: Secondary | ICD-10-CM

## 2021-02-04 NOTE — Progress Notes (Signed)
I connected with Jordan [name] today by telephone and verified that I am speaking with the correct person using two identifiers. Location patient: home Location provider: work Persons participating in the virtual visit: Jordan [name], Jordan [name] (daughter), Jordan Ponder LPN.   I discussed the limitations, risks, security and privacy concerns of performing an evaluation and management service by telephone and the availability of in person appointments. I also discussed with the patient that there may be a patient responsible charge related to this service. The patient expressed understanding and verbally consented to this telephonic visit.    Interactive audio and video telecommunications were attempted between this provider and patient, however failed, due to patient having technical difficulties OR patient did not have access to video capability.  We continued and completed visit with audio only.     Vital signs may be patient reported or missing.  Subjective:   Jordan [name] is a 87 y.o. female who presents for Medicare Annual (Subsequent) preventive examination.  Review of Systems     Cardiac Risk Factors include: advanced age (>102men, >32 women);hypertension;sedentary lifestyle     Objective:    Today's Vitals   02/04/21 1021 02/04/21 1022  Weight: 108 lb (49 kg)   Height: 5' 0.5" (1.537 m)   PainSc:  8    Body mass index is 20.75 kg/m.  Advanced Directives 02/04/2021 03/25/2020 01/15/2020 12/04/2019 01/08/2019 01/16/2018  Does Patient Have a Medical Advance Directive? Yes No Yes No Yes Yes  Type of Youth worker of [place];Living will - Healthcare Power of [employer] Power of Attorney  Copy of Healthcare Power of Attorney in Chart? No - copy requested - No - copy requested - No - copy requested -  Would patient like information on creating a medical advance directive? - No - Patient declined - No - Patient  declined - -    Current Medications (verified) Outpatient Encounter Medications as of 02/04/2021  Medication Sig   albuterol (VENTOLIN HFA) 108 (90 Base) MCG/ACT inhaler Inhale 1 puff into the lungs every 6 (six) hours as needed for wheezing or shortness of breath.   ALLERGY RELIEF 10 MG tablet TAKE 1 TABLET BY MOUTH EVERY DAY   amLODipine (NORVASC) 5 MG tablet TAKE 1 TABLET BY MOUTH EVERY DAY   aspirin EC 81 MG tablet Take 81 mg by mouth daily. Swallow whole.   ENBREL SURECLICK 50 MG/ML injection Inject 0.98 mLs (50 mg total) into the skin once a week.   feeding supplement, ENSURE ENLIVE, (ENSURE ENLIVE) LIQD Take 237 mLs by mouth daily in the afternoon.   ferrous sulfate 325 (65 FE) MG tablet Take 325 mg by mouth daily with breakfast.   fluticasone (FLONASE) 50 MCG/ACT nasal spray INSTILL 2 SPRAYS IN EACH NOSTRIL EVERY DAY (Patient taking differently: Place 2 sprays into both nostrils daily as needed for allergies.)   folic acid (FOLVITE) 1 MG tablet TAKE 1 TABLET(1 MG) BY MOUTH DAILY (Patient taking differently: Take 1 mg by mouth daily.)   latanoprost (XALATAN) 0.005 % ophthalmic solution Place 1 drop into both eyes at bedtime.    NAMZARIC 28-10 MG CP24 TAKE 1 CAPSULE BY MOUTH EVERY DAY   omeprazole (PRILOSEC) 20 MG capsule TAKE 1 CAPSULE BY MOUTH EVERY DAY   QUEtiapine (SEROQUEL) 100 MG tablet Take 1 tablet (100 mg total) by mouth at bedtime.   simvastatin (ZOCOR) 20 MG tablet Take 1 tablet (20 mg total) by mouth every evening.   SYNTHROID  75 MCG tablet Take 1 tablet by mouth daily at breakfast on Mon - Sat, hold on Sun   timolol (TIMOPTIC) 0.5 % ophthalmic solution 1 drop 2 (two) times daily.   Vitamin D, Ergocalciferol, (DRISDOL) 1.25 MG (50000 UNIT) CAPS capsule TAKE 1 CAPSULE BY MOUTH TWICE A WEEK   No facility-administered encounter medications on file as of 02/04/2021.    Allergies (verified) Penicillins   History: Past Medical History:  Diagnosis Date   Allergy    Anxiety     per pt's daughter   Arthritis    Asthma    pt reported   Depression    GERD (gastroesophageal reflux disease)    Hearing deficit L   High cholesterol    Hypertension    Osteoporosis    Rheumatoid arthritis (HCC)    Stroke [facility])    Past Surgical History:  Procedure Laterality Date   ABDOMINAL HYSTERECTOMY     KNEE ARTHROPLASTY     REPLACEMENT TOTAL KNEE BILATERAL Bilateral    WRIST SURGERY Left    History reviewed. No pertinent family history. Social History   Socioeconomic History   Marital status: Single    Spouse name: Not on file   Number of children: 2   Years of education: 8th   Highest education level: Not on file  Occupational History   Occupation: Retired  Tobacco Use   Smoking status: Former    Packs/day: 0.25    Years: 13.00    Pack years: 3.25    Types: Cigarettes    Quit date: 08/18/2006    Years since quitting: 14.4   Smokeless tobacco: Never  Vaping Use   Vaping Use: Never used  Substance and Sexual Activity   Alcohol use: No   Drug use: Not Currently    Types: Hydrocodone   Sexual activity: Not Currently  Other Topics Concern   Not on file  Social History Narrative   Lives at home with grandson.   Right-handed.   2-3 cups coffee per day.   Social Determinants of Health   Financial Resource Strain: Low Risk    Difficulty of Paying Living Expenses: Not hard at all  Food Insecurity: No Food Insecurity   Worried About Programme researcher, broadcasting/film/video in the Last Year: Never true   Ran Out of Food in the Last Year: Never true  Transportation Needs: No Transportation Needs   Lack of Transportation (Medical): No   Lack of Transportation (Non-Medical): No  Physical Activity: Inactive   Days of Exercise per Week: 0 days   Minutes of Exercise per Session: 0 min  Stress: No Stress Concern Present   Feeling of Stress : Not at all  Social Connections: Not on file    Tobacco Counseling Counseling given: Not Answered   Clinical Intake:  Pre-visit  preparation completed: Yes  Pain : 0-10 Pain Score: 8  Pain Type: Chronic pain Pain Location: Eye Pain Orientation: Right Pain Descriptors / Indicators: Heaviness, Nagging Pain Onset: More than a month ago Pain Frequency: Constant     Nutritional Status: BMI of 19-24  Normal Nutritional Risks: None Diabetes: No  How often do you need to have someone help you when you read instructions, pamphlets, or other written materials from your doctor or pharmacy?: 4 - Often  Diabetic? no  Interpreter Needed?: No  Information entered by :: NAllen LPN   Activities of Daily Living In your present state of health, do you have any difficulty performing the following activities: 02/04/2021 03/25/2020  Hearing? N N  Vision? Y N  Difficulty concentrating or making decisions? Y N  Walking or climbing stairs? Y N  Dressing or bathing? Y Y  Doing errands, shopping? Malvin Johns  Preparing Food and eating ? Y -  Using the Toilet? Y -  In the past six months, have you accidently leaked urine? N -  Do you have problems with loss of bowel control? Y -  Comment wears depends -  Managing your Medications? Y -  Managing your Finances? Y -  Housekeeping or managing your Housekeeping? Y -  Some recent data might be hidden    Patient Care Team: Arnette Felts, FNP as PCP - General (General Practice) Bevelyn Ngo as Social Worker [place], Karma Lew, RN as Case Manager  Indicate any recent Medical Services you may have received from other than [facility] providers in the past year (date may be approximate).     Assessment:   This is a routine wellness examination for [place].  Hearing/Vision screen Vision Screening - Comments:: Regular eye exams, Dr. [name]  Dietary issues and exercise activities discussed: Current Exercise Habits: The patient does not participate in regular exercise at present   Goals Addressed             This Visit's Progress    Patient Stated       02/04/2021, no goals        Depression Screen PHQ 2/9 Scores 02/04/2021 01/15/2020 06/24/2019 03/14/2019 02/20/2019 01/08/2019 08/13/2018  PHQ - 2 Score 0 3 0 0 1 0 0  PHQ- 9 Score - 21 - - - - -  Exception Documentation - Other- indicate reason in comment box - - - - -  Not completed - completed by CMA - - - - -    Fall Risk Fall Risk  02/04/2021 01/15/2020 06/24/2019 03/14/2019 02/20/2019  Falls in the past year? 0 1 0 0 0  Comment - lost balance - - -  Number falls in past yr: - 0 - - -  Comment - - - - -  Injury with Fall? - 1 - - -  Comment - sutures over right eye - - -  Risk for fall due to : Impaired mobility;Impaired vision;Medication side effect History of fall(s);Impaired balance/gait;Medication side effect;Impaired vision - - -  Follow up Falls evaluation completed;Education provided;Falls prevention discussed Falls evaluation completed;Education provided;Falls prevention discussed - - -    FALL RISK PREVENTION PERTAINING TO THE HOME:  Any stairs in or around the home? No  If so, are there any without handrails? N/a Home free of loose throw rugs in walkways, pet beds, electrical cords, etc? Yes  Adequate lighting in your home to reduce risk of falls? Yes   ASSISTIVE DEVICES UTILIZED TO PREVENT FALLS:  Life alert? Yes  Use of a cane, walker or w/c? Yes  Grab bars in the bathroom? Yes  Shower chair or bench in shower? Yes  Elevated toilet seat or a handicapped toilet? Yes   TIMED UP AND GO:  Was the test performed? No .      Cognitive Function: MMSE - Mini Mental State Exam 07/13/2017  Orientation to time 3  Orientation to Place 5  Registration 3  Attention/ Calculation 5  Recall 0  Language- name 2 objects 2  Language- repeat 1  Language- follow 3 step command 3  Language- read & follow direction 1  Write a sentence 1  Copy design 1  Total score 25  6CIT Screen 01/08/2019  What Year? 0 points  What month? 0 points  What time? 3 points  Count back from 20 2 points  Months in  reverse 4 points  Repeat phrase 10 points  Total Score 19    Immunizations Immunization History  Administered Date(s) Administered   Fluad Quad(high Dose 65+) 04/06/2020   Moderna Sars-Covid-2 Vaccination 09/26/2019, 10/24/2019    TDAP status: Due, Education has been provided regarding the importance of this vaccine. Advised may receive this vaccine at local pharmacy or Health Dept. Aware to provide a copy of the vaccination record if obtained from local pharmacy or Health Dept. Verbalized acceptance and understanding.  Flu Vaccine status: Up to date  Pneumococcal vaccine status: Due, Education has been provided regarding the importance of this vaccine. Advised may receive this vaccine at local pharmacy or Health Dept. Aware to provide a copy of the vaccination record if obtained from local pharmacy or Health Dept. Verbalized acceptance and understanding.  Covid-19 vaccine status: Completed vaccines  Qualifies for Shingles Vaccine? Yes   Zostavax completed No   Shingrix Completed?: No.    Education has been provided regarding the importance of this vaccine. Patient has been advised to call insurance company to determine out of pocket expense if they have not yet received this vaccine. Advised may also receive vaccine at local pharmacy or Health Dept. Verbalized acceptance and understanding.  Screening Tests Health Maintenance  Topic Date Due   Zoster Vaccines- Shingrix (1 of 2) Never done   COVID-19 Vaccine (3 - Moderna risk series) 11/21/2019   PNA vac Low Risk Adult (1 of 2 - PCV13) 11/15/2021 (Originally 11/28/2000)   TETANUS/TDAP  11/16/2021 (Originally 11/29/1952)   INFLUENZA VACCINE  02/22/2021   MAMMOGRAM  07/14/2021   DEXA SCAN  Completed   HPV VACCINES  Aged Out    Health Maintenance  Health Maintenance Due  Topic Date Due   Zoster Vaccines- Shingrix (1 of 2) Never done   COVID-19 Vaccine (3 - Moderna risk series) 11/21/2019    Colorectal cancer screening: No longer  required.   Mammogram status: No longer required due to age.  Bone Density status: Completed 06/19/2017.   Lung Cancer Screening: (Low Dose CT Chest recommended if Age 75-80 years, 30 pack-year currently smoking OR have quit w/in 15years.) does not qualify.   Lung Cancer Screening Referral: no  Additional Screening:  Hepatitis C Screening: does not qualify;   Vision Screening: Recommended annual ophthalmology exams for early detection of glaucoma and other disorders of the eye. Is the patient up to date with their annual eye exam?  Yes  Who is the provider or what is the name of the office in which the patient attends annual eye exams? Dr. [name] If pt is not established with a provider, would they like to be referred to a provider to establish care? No .   Dental Screening: Recommended annual dental exams for proper oral hygiene  Community Resource Referral / Chronic Care Management: CRR required this visit?  No   CCM required this visit?  No      Plan:     I have personally reviewed and noted the following in the patient's chart:   Medical and social history Use of alcohol, tobacco or illicit drugs  Current medications and supplements including opioid prescriptions.  Functional ability and status Nutritional status Physical activity Advanced directives List of other physicians Hospitalizations, surgeries, and ER visits in previous 12 months Vitals Screenings to include cognitive, depression,  and falls Referrals and appointments  In addition, I have reviewed and discussed with patient certain preventive protocols, quality metrics, and best practice recommendations. A written personalized care plan for preventive services as well as general preventive health recommendations were provided to patient.     Barb Merino, LPN   08/04/5518   Nurse Notes: 6 CIT not administered. Patient has diagnosis of dementia.

## 2021-02-04 NOTE — Patient Instructions (Signed)
[name] , Thank you for taking time to come for your Medicare Wellness Visit. I appreciate your ongoing commitment to your health goals. Please review the following plan we discussed and let me know if I can assist you in the future.   Screening recommendations/referrals: Colonoscopy: not required Mammogram: not required Bone Density: completed 06/19/2017 Recommended yearly ophthalmology/optometry visit for glaucoma screening and checkup Recommended yearly dental visit for hygiene and checkup  Vaccinations: Influenza vaccine: completed 04/06/2020, due 02/22/2021 Pneumococcal vaccine: due Tdap vaccine: due Shingles vaccine: discussed   Covid-19: 10/24/2019, 09/26/2019  Advanced directives: Please bring a copy of your POA (Power of Attorney) and/or Living Will to your next appointment.   Conditions/risks identified: none  Next appointment: Follow up in one year for your annual wellness visit    Preventive Care 65 Years and Older, Female Preventive care refers to lifestyle choices and visits with your health care provider that can promote health and wellness. What does preventive care include? A yearly physical exam. This is also called an annual well check. Dental exams once or twice a year. Routine eye exams. Ask your health care provider how often you should have your eyes checked. Personal lifestyle choices, including: Daily care of your teeth and gums. Regular physical activity. Eating a healthy diet. Avoiding tobacco and drug use. Limiting alcohol use. Practicing safe sex. Taking low-dose aspirin every day. Taking vitamin and mineral supplements as recommended by your health care provider. What happens during an annual well check? The services and screenings done by your health care provider during your annual well check will depend on your age, overall health, lifestyle risk factors, and family history of disease. Counseling  Your health care provider may ask you  questions about your: Alcohol use. Tobacco use. Drug use. Emotional well-being. Home and relationship well-being. Sexual activity. Eating habits. History of falls. Memory and ability to understand (cognition). Work and work Astronomer. Reproductive health. Screening  You may have the following tests or measurements: Height, weight, and BMI. Blood pressure. Lipid and cholesterol levels. These may be checked every 5 years, or more frequently if you are over 42 years old. Skin check. Lung cancer screening. You may have this screening every year starting at age 10 if you have a 30-pack-year history of smoking and currently smoke or have quit within the past 15 years. Fecal occult blood test (FOBT) of the stool. You may have this test every year starting at age 63. Flexible sigmoidoscopy or colonoscopy. You may have a sigmoidoscopy every 5 years or a colonoscopy every 10 years starting at age 66. Hepatitis C blood test. Hepatitis B blood test. Sexually transmitted disease (STD) testing. Diabetes screening. This is done by checking your blood sugar (glucose) after you have not eaten for a while (fasting). You may have this done every 1-3 years. Bone density scan. This is done to screen for osteoporosis. You may have this done starting at age 110. Mammogram. This may be done every 1-2 years. Talk to your health care provider about how often you should have regular mammograms. Talk with your health care provider about your test results, treatment options, and if necessary, the need for more tests. Vaccines  Your health care provider may recommend certain vaccines, such as: Influenza vaccine. This is recommended every year. Tetanus, diphtheria, and acellular pertussis (Tdap, Td) vaccine. You may need a Td booster every 10 years. Zoster vaccine. You may need this after age 83. Pneumococcal 13-valent conjugate (PCV13) vaccine. One dose is recommended after  age 60. Pneumococcal polysaccharide  (PPSV23) vaccine. One dose is recommended after age 66. Talk to your health care provider about which screenings and vaccines you need and how often you need them. This information is not intended to replace advice given to you by your health care provider. Make sure you discuss any questions you have with your health care provider. Document Released: 08/07/2015 Document Revised: 03/30/2016 Document Reviewed: 05/12/2015 Elsevier Interactive Patient Education  2017 [place] Prevention in the Home Falls can cause injuries. They can happen to people of all ages. There are many things you can do to make your home safe and to help prevent falls. What can I do on the outside of my home? Regularly fix the edges of walkways and driveways and fix any cracks. Remove anything that might make you trip as you walk through a door, such as a raised step or threshold. Trim any bushes or trees on the path to your home. Use bright outdoor lighting. Clear any walking paths of anything that might make someone trip, such as rocks or tools. Regularly check to see if handrails are loose or broken. Make sure that both sides of any steps have handrails. Any raised decks and porches should have guardrails on the edges. Have any leaves, snow, or ice cleared regularly. Use sand or salt on walking paths during winter. Clean up any spills in your garage right away. This includes oil or grease spills. What can I do in the bathroom? Use night lights. Install grab bars by the toilet and in the tub and shower. Do not use towel bars as grab bars. Use non-skid mats or decals in the tub or shower. If you need to sit down in the shower, use a plastic, non-slip stool. Keep the floor dry. Clean up any water that spills on the floor as soon as it happens. Remove soap buildup in the tub or shower regularly. Attach bath mats securely with double-sided non-slip rug tape. Do not have throw rugs and other things on the  floor that can make you trip. What can I do in the bedroom? Use night lights. Make sure that you have a light by your bed that is easy to reach. Do not use any sheets or blankets that are too big for your bed. They should not hang down onto the floor. Have a firm chair that has side arms. You can use this for support while you get dressed. Do not have throw rugs and other things on the floor that can make you trip. What can I do in the kitchen? Clean up any spills right away. Avoid walking on wet floors. Keep items that you use a lot in easy-to-reach places. If you need to reach something above you, use a strong step stool that has a grab bar. Keep electrical cords out of the way. Do not use floor polish or wax that makes floors slippery. If you must use wax, use non-skid floor wax. Do not have throw rugs and other things on the floor that can make you trip. What can I do with my stairs? Do not leave any items on the stairs. Make sure that there are handrails on both sides of the stairs and use them. Fix handrails that are broken or loose. Make sure that handrails are as long as the stairways. Check any carpeting to make sure that it is firmly attached to the stairs. Fix any carpet that is loose or worn. Avoid having throw rugs  at the top or bottom of the stairs. If you do have throw rugs, attach them to the floor with carpet tape. Make sure that you have a light switch at the top of the stairs and the bottom of the stairs. If you do not have them, ask someone to add them for you. What else can I do to help prevent falls? Wear shoes that: Do not have high heels. Have rubber bottoms. Are comfortable and fit you well. Are closed at the toe. Do not wear sandals. If you use a stepladder: Make sure that it is fully opened. Do not climb a closed stepladder. Make sure that both sides of the stepladder are locked into place. Ask someone to hold it for you, if possible. Clearly mark and make  sure that you can see: Any grab bars or handrails. First and last steps. Where the edge of each step is. Use tools that help you move around (mobility aids) if they are needed. These include: Canes. Walkers. Scooters. Crutches. Turn on the lights when you go into a dark area. Replace any light bulbs as soon as they burn out. Set up your furniture so you have a clear path. Avoid moving your furniture around. If any of your floors are uneven, fix them. If there are any pets around you, be aware of where they are. Review your medicines with your doctor. Some medicines can make you feel dizzy. This can increase your chance of falling. Ask your doctor what other things that you can do to help prevent falls. This information is not intended to replace advice given to you by your health care provider. Make sure you discuss any questions you have with your health care provider. Document Released: 05/07/2009 Document Revised: 12/17/2015 Document Reviewed: 08/15/2014 Elsevier Interactive Patient Education  2017 [employer].

## 2021-02-08 DIAGNOSIS — H9312 Tinnitus, left ear: Secondary | ICD-10-CM | POA: Diagnosis not present

## 2021-02-08 DIAGNOSIS — H903 Sensorineural hearing loss, bilateral: Secondary | ICD-10-CM | POA: Diagnosis not present

## 2021-02-10 ENCOUNTER — Ambulatory Visit: Payer: Medicare Other | Admitting: Nurse Practitioner

## 2021-02-17 ENCOUNTER — Ambulatory Visit (INDEPENDENT_AMBULATORY_CARE_PROVIDER_SITE_OTHER): Payer: Medicare Other | Admitting: Nurse Practitioner

## 2021-02-17 ENCOUNTER — Encounter: Payer: Self-pay | Admitting: Nurse Practitioner

## 2021-02-17 ENCOUNTER — Other Ambulatory Visit: Payer: Self-pay

## 2021-02-17 VITALS — BP 116/72 | HR 77 | Temp 98.2°F | Ht 60.5 in | Wt 105.4 lb

## 2021-02-17 DIAGNOSIS — E039 Hypothyroidism, unspecified: Secondary | ICD-10-CM | POA: Diagnosis not present

## 2021-02-17 DIAGNOSIS — L609 Nail disorder, unspecified: Secondary | ICD-10-CM | POA: Diagnosis not present

## 2021-02-17 NOTE — Progress Notes (Signed)
I,Sharlene Mccluskey,acting as a Neurosurgeon for Arnette Felts, FNP.,have documented all relevant documentation on the behalf of Arnette Felts, FNP,as directed by  Arnette Felts, FNP while in the presence of Arnette Felts, FNP.   This visit occurred during the SARS-CoV-2 public health emergency.  Safety protocols were in place, including screening questions prior to the visit, additional usage of staff PPE, and extensive cleaning of exam room while observing appropriate contact time as indicated for disinfecting solutions.  Subjective:     Patient ID: Jordan [name] , female    DOB: 1934-04-26 , 87 y.o.   MRN: [id number]   Chief Complaint  Patient presents with   Nail Problem    HPI  She is here today for a nail problem. She is starting to have discomfort with her toenails. She is interested in a referral to a podiatrist.      Past Medical History:  Diagnosis Date   Allergy    Anxiety    per pt's daughter   Arthritis    Asthma    pt reported   Depression    GERD (gastroesophageal reflux disease)    Hearing deficit L   High cholesterol    Hypertension    Osteoporosis    Rheumatoid arthritis (HCC)    Stroke [employer])      History reviewed. No pertinent family history.   Current Outpatient Medications:    albuterol (VENTOLIN HFA) 108 (90 Base) MCG/ACT inhaler, Inhale 1 puff into the lungs every 6 (six) hours as needed for wheezing or shortness of breath., Disp: 6.7 g, Rfl: 5   ALLERGY RELIEF 10 MG tablet, TAKE 1 TABLET BY MOUTH EVERY DAY, Disp: 30 tablet, Rfl: 1   amLODipine (NORVASC) 5 MG tablet, TAKE 1 TABLET BY MOUTH EVERY DAY, Disp: 30 tablet, Rfl: 2   aspirin EC 81 MG tablet, Take 81 mg by mouth daily. Swallow whole., Disp: , Rfl:    ENBREL SURECLICK 50 MG/ML injection, Inject 0.98 mLs (50 mg total) into the skin once a week., Disp: 12 mL, Rfl: 0   feeding supplement, ENSURE ENLIVE, (ENSURE ENLIVE) LIQD, Take 237 mLs by mouth daily in the afternoon., Disp: , Rfl:    ferrous sulfate 325  (65 FE) MG tablet, Take 325 mg by mouth daily with breakfast., Disp: , Rfl:    fluticasone (FLONASE) 50 MCG/ACT nasal spray, INSTILL 2 SPRAYS IN EACH NOSTRIL EVERY DAY (Patient taking differently: Place 2 sprays into both nostrils daily as needed for allergies.), Disp: 16 g, Rfl: 1   folic acid (FOLVITE) 1 MG tablet, TAKE 1 TABLET(1 MG) BY MOUTH DAILY (Patient taking differently: Take 1 mg by mouth daily.), Disp: 90 tablet, Rfl: 0   latanoprost (XALATAN) 0.005 % ophthalmic solution, Place 1 drop into both eyes at bedtime. , Disp: , Rfl:    NAMZARIC 28-10 MG CP24, TAKE 1 CAPSULE BY MOUTH EVERY DAY, Disp: 90 capsule, Rfl: 1   omeprazole (PRILOSEC) 20 MG capsule, TAKE 1 CAPSULE BY MOUTH EVERY DAY, Disp: 90 capsule, Rfl: 1   QUEtiapine (SEROQUEL) 100 MG tablet, Take 1 tablet (100 mg total) by mouth at bedtime., Disp: 90 tablet, Rfl: 1   simvastatin (ZOCOR) 20 MG tablet, Take 1 tablet (20 mg total) by mouth every evening., Disp: 90 tablet, Rfl: 1   SYNTHROID 75 MCG tablet, Take 1 tablet by mouth daily at breakfast on Mon - Sat, hold on Sun, Disp: 90 tablet, Rfl: 1   timolol (TIMOPTIC) 0.5 % ophthalmic solution, 1 drop 2 (two)  times daily., Disp: , Rfl:    Vitamin D, Ergocalciferol, (DRISDOL) 1.25 MG (50000 UNIT) CAPS capsule, TAKE 1 CAPSULE BY MOUTH TWICE A WEEK, Disp: 24 capsule, Rfl: 1   Allergies  Allergen Reactions   Penicillins Hives     Review of Systems  Constitutional: Negative.   Respiratory: Negative.    Cardiovascular: Negative.  Negative for chest pain, palpitations and leg swelling.  Skin:        Toenails are long and thick causing discomfort  Neurological: Negative.   Psychiatric/Behavioral: Negative.      Today's Vitals   02/17/21 0832  BP: 116/72  Pulse: 77  Temp: 98.2 F (36.8 C)  TempSrc: Oral  Weight: 105 lb 6.4 oz (47.8 kg)  Height: 5' 0.5" (1.537 m)  PainSc: 5   PainLoc: Toe   Body mass index is 20.25 kg/m.  Wt Readings from Last 3 Encounters:  02/17/21 105 lb  6.4 oz (47.8 kg)  02/04/21 108 lb (49 kg)  12/30/20 104 lb (47.2 kg)    BP Readings from Last 3 Encounters:  02/17/21 116/72  12/30/20 122/60  11/05/20 136/64    Objective:  Physical Exam Vitals reviewed.  Constitutional:      General: She is not in acute distress.    Appearance: Normal appearance.  Cardiovascular:     Rate and Rhythm: Normal rate and regular rhythm.     Pulses: Normal pulses.     Heart sounds: Normal heart sounds. No murmur heard. Neurological:     Mental Status: She is alert.        Assessment And Plan:     1. Nail abnormalities Comments: Long toenails bilateral toes predominantly great toes Will refer to [facility] at family/caregiver request  2. Acquired hypothyroidism Comments: Will follow up onher labs from previous visit did not result    Patient was given opportunity to ask questions. Patient verbalized understanding of the plan and was able to repeat key elements of the plan. All questions were answered to their satisfaction.  Arnette Felts, FNP   I, Arnette Felts, FNP, have reviewed all documentation for this visit. The documentation on 02/17/21 for the exam, diagnosis, procedures, and orders are all accurate and complete.   IF YOU HAVE BEEN REFERRED TO A SPECIALIST, IT MAY TAKE 1-2 WEEKS TO SCHEDULE/PROCESS THE REFERRAL. IF YOU HAVE NOT HEARD FROM US/SPECIALIST IN TWO WEEKS, PLEASE GIVE [place] A CALL AT [phone].   THE PATIENT IS ENCOURAGED TO PRACTICE SOCIAL DISTANCING DUE TO THE COVID-19 PANDEMIC.

## 2021-02-22 ENCOUNTER — Other Ambulatory Visit: Payer: Self-pay | Admitting: Nurse Practitioner

## 2021-02-22 DIAGNOSIS — I10 Essential (primary) hypertension: Secondary | ICD-10-CM

## 2021-03-04 DIAGNOSIS — I739 Peripheral vascular disease, unspecified: Secondary | ICD-10-CM | POA: Diagnosis not present

## 2021-03-04 DIAGNOSIS — L603 Nail dystrophy: Secondary | ICD-10-CM | POA: Diagnosis not present

## 2021-03-04 DIAGNOSIS — B351 Tinea unguium: Secondary | ICD-10-CM | POA: Diagnosis not present

## 2021-03-04 DIAGNOSIS — M12271 Villonodular synovitis (pigmented), right ankle and foot: Secondary | ICD-10-CM | POA: Diagnosis not present

## 2021-03-04 DIAGNOSIS — M79674 Pain in right toe(s): Secondary | ICD-10-CM | POA: Diagnosis not present

## 2021-03-04 DIAGNOSIS — L600 Ingrowing nail: Secondary | ICD-10-CM | POA: Diagnosis not present

## 2021-03-04 DIAGNOSIS — M12272 Villonodular synovitis (pigmented), left ankle and foot: Secondary | ICD-10-CM | POA: Diagnosis not present

## 2021-03-04 DIAGNOSIS — M79675 Pain in left toe(s): Secondary | ICD-10-CM | POA: Diagnosis not present

## 2021-03-16 ENCOUNTER — Other Ambulatory Visit: Payer: Self-pay

## 2021-03-16 ENCOUNTER — Ambulatory Visit: Payer: Medicare Other

## 2021-03-16 DIAGNOSIS — Z23 Encounter for immunization: Secondary | ICD-10-CM

## 2021-03-16 NOTE — Progress Notes (Signed)
   Covid-19 Vaccination Clinic  Name:  Jordan [name]    MRN: [id number] DOB: [birth year]  03/16/2021  Jordan [name] was observed post Covid-19 immunization for 15 minutes without incident. She was provided with Vaccine Information Sheet and instruction to access the V-Safe system.   Jordan [name] was instructed to call 911 with any severe reactions post vaccine: Difficulty breathing  Swelling of face and throat  A fast heartbeat  A bad rash all over body  Dizziness and weakness   Immunizations Administered     Name Date Dose VIS Date Route   Moderna Covid-19 Booster Vaccine 03/16/2021  3:25 PM 0.25 mL 05/13/2020 Intramuscular   Manufacturer: Moderna   Lot: [id number]   NDC: 74128-786-76

## 2021-03-18 ENCOUNTER — Other Ambulatory Visit: Payer: Self-pay | Admitting: Nurse Practitioner

## 2021-03-18 ENCOUNTER — Other Ambulatory Visit: Payer: Self-pay | Admitting: Rheumatology

## 2021-03-18 DIAGNOSIS — G4700 Insomnia, unspecified: Secondary | ICD-10-CM

## 2021-03-22 ENCOUNTER — Other Ambulatory Visit: Payer: Self-pay | Admitting: Rheumatology

## 2021-03-23 NOTE — Progress Notes (Signed)
Office Visit Note  Patient: Jordan [name]             Date of Birth: 08/23/1933           MRN: [id number]             PCP: Arnette Felts, FNP Referring: Arnette Felts, FNP Visit Date: 03/24/2021 Occupation: @GUAROCC @  Subjective:  Popping in left knee    History of Present Illness: Jordan [name] is a 87 y.o. female with history of seropositive rheumatoid arthritis, DDD, and osteoporosis.  Patient is on Enbrel 50 mg subcutaneous injections once weekly.  She has not missed any doses of Enbrel recently.  She continues to tolerate Enbrel without any side effects or injection site reactions.  She has not had any recent infections.  She denies any recent rheumatoid arthritis flares.  She states she occasionally hears a cracking and popping noise in the left knee but denies any discomfort or joint swelling.  She has been using a cane to assist with ambulation.  She denies any recent falls.  She has some discomfort in the left wrist with range of motion but denies any pain or swelling.  She has been using arthritis compression gloves which alleviated her discomfort.  She denies any other joint pain or joint swelling at this time.    Activities of Daily Living:  Patient reports morning stiffness for 0  none .   Patient Denies nocturnal pain.  Difficulty dressing/grooming: Reports Difficulty climbing stairs: Reports Difficulty getting out of chair: Denies Difficulty using hands for taps, buttons, cutlery, and/or writing: Denies  Review of Systems  Constitutional:  Negative for fatigue.  HENT:  Negative for mouth dryness.   Eyes:  Negative for dryness.  Respiratory:  Negative for shortness of breath.   Cardiovascular:  Negative for swelling in legs/feet.  Gastrointestinal:  Negative for constipation.  Endocrine: Positive for cold intolerance and heat intolerance.  Genitourinary:  Negative for difficulty urinating.  Musculoskeletal:  Positive for joint pain, gait problem, joint pain and  morning stiffness.  Skin:  Negative for rash.  Allergic/Immunologic: Negative for susceptible to infections.  Neurological:  Positive for weakness.  Hematological:  Negative for bruising/bleeding tendency.  Psychiatric/Behavioral:  Positive for sleep disturbance.    PMFS History:  Patient Active Problem List   Diagnosis Date Noted   Chest pain    Hypotension 03/24/2020   Alzheimer's disease (HCC) 08/13/2019   Essential hypertension 08/13/2018   Acquired hypothyroidism 08/13/2018   Rash and nonspecific skin eruption 08/13/2018   Memory loss 04/12/2017   Depression 04/12/2017   Rheumatoid arthritis involving multiple sites with positive rheumatoid factor (HCC) 08/17/2016   High risk medication use 08/17/2016   H/O total knee replacement, bilateral 08/17/2016   Spondylosis of lumbar region without myelopathy or radiculopathy 08/17/2016   History of glaucoma/ patient is legally blind 08/17/2016   History of gastroesophageal reflux (GERD) 08/17/2016   History of diverticulosis 08/17/2016   History of hypothyroidism 08/17/2016   history of asthmatic bronchitis 08/17/2016   History of memory loss 08/17/2016   History of atherosclerosis 08/17/2016    Past Medical History:  Diagnosis Date   Allergy    Anxiety    per pt's daughter   Arthritis    Asthma    pt reported   Depression    GERD (gastroesophageal reflux disease)    Hearing deficit L   High cholesterol    Hypertension    Osteoporosis    Rheumatoid arthritis (HCC)  Stroke [facility])     History reviewed. No pertinent family history. Past Surgical History:  Procedure Laterality Date   ABDOMINAL HYSTERECTOMY     KNEE ARTHROPLASTY     REPLACEMENT TOTAL KNEE BILATERAL Bilateral    WRIST SURGERY Left    Social History   Social History Narrative   Lives at home with grandson.   Right-handed.   2-3 cups coffee per day.   Immunization History  Administered Date(s) Administered   Fluad Quad(high Dose 65+) 04/06/2020    Moderna SARS-COV2 Booster Vaccination 03/16/2021   Moderna Sars-Covid-2 Vaccination 09/26/2019, 10/24/2019     Objective: Vital Signs: BP 117/72 (BP Location: Left Arm, Patient Position: Sitting, Cuff Size: Normal)   Pulse 87   Resp 16   Ht  (1.549 m)   Wt 109 lb (49.4 kg)   BMI 20.60 kg/m    Physical Exam Vitals and nursing note reviewed.  Constitutional:      Appearance: She is well-developed.  HENT:     Head: Normocephalic and atraumatic.  Eyes:     Conjunctiva/sclera: Conjunctivae normal.  Pulmonary:     Effort: Pulmonary effort is normal.  Abdominal:     Palpations: Abdomen is soft.  Musculoskeletal:     Cervical back: Normal range of motion.  Skin:    General: Skin is warm and dry.     Capillary Refill: Capillary refill takes less than 2 seconds.  Neurological:     Mental Status: She is alert and oriented to person, place, and time.  Psychiatric:        Behavior: Behavior normal.     Musculoskeletal Exam: C-spine has good range of motion with no discomfort.  Shoulder joints, elbow joints, wrist joints, MCPs, PIPs, DIPs have good range of motion with no synovitis.  Some discomfort with range of motion of the left wrist.  No tenderness or synovitis over wrist joints or MCP joints.  Complete fist formation bilaterally.  PIP and DIP thickening consistent with osteoarthritis of both hands noted.  CMC joint prominence noted bilaterally.  Hip joints difficult to assess well in seated position.  Knee joints have good range of motion with no warmth or effusion.  Ankle joints have good range of motion with no tenderness or joint swelling.  CDAI Exam: CDAI Score: 0.2  Patient Global: 1 mm; Provider Global: 1 mm Swollen: 0 ; Tender: 0  Joint Exam 03/24/2021   No joint exam has been documented for this visit   There is currently no information documented on the homunculus. Go to the Rheumatology activity and complete the homunculus joint exam.  Investigation: No  additional findings.  Imaging: No results found.  Recent Labs: Lab Results  Component Value Date   WBC 7.9 11/05/2020   HGB 12.1 11/05/2020   PLT 240 11/05/2020   NA 140 09/28/2020   K 4.6 09/28/2020   CL 102 09/28/2020   CO2 24 09/28/2020   GLUCOSE 76 09/28/2020   BUN 14 09/28/2020   CREATININE 0.78 09/28/2020   BILITOT <0.2 09/28/2020   ALKPHOS 63 09/28/2020   AST 14 09/28/2020   ALT 8 09/28/2020   PROT 7.1 09/28/2020   ALBUMIN 4.2 09/28/2020   CALCIUM 9.5 09/28/2020   GFRAA 89 06/11/2020   QFTBGOLDPLUS NEGATIVE 06/11/2020    Speciality Comments: No specialty comments available.  Procedures:  No procedures performed Allergies: Penicillins   Assessment / Plan:     Visit Diagnoses: Rheumatoid arthritis involving multiple sites with positive rheumatoid factor (HCC): She  has no joint tenderness or synovitis on examination today.  She has clinically been doing well on Enbrel 50 mg subcutaneous injections once weekly.  She has not missed any doses of Enbrel recently and continues to tolerate out any side effects or injection site reactions.  She experiences occasional cracking and popping sensation in the left knee replacement but is not having any discomfort or joint swelling.  The left knee replacement had good range of motion with no warmth or effusion or crepitus on examination today.  She continues to use arthritis compression gloves on a daily basis which alleviates any discomfort or stiffness she experiences.  We discussed the importance of joint protection and muscle strengthening.  She was encouraged to increase her exercise regimen.  She will remain on Enbrel as prescribed.  She was advised to notify us if she develops increased joint pain or joint swelling.  She will follow-up in the office in 5 months.  High risk medication use - Enbrel Sureclick 50 mg sq injections every 7 days. CBC and CMP are overdue.  Orders for CBC and CMP were released today.  Her next lab work  will be due in November and every 3 months to monitor for drug toxicity.  TB Gold negative on 06/01/2020.  Future order for TB gold will be placed today.- Plan: CBC with Differential/Platelet, COMPLETE METABOLIC PANEL WITH GFR, QuantiFERON-TB Gold Plus She has not had any recent infections.  Discussed the importance of holding Enbrel if she develops signs or symptoms of an infection and to resume once infection has completely cleared.  Screening for tuberculosis -Future order for TB gold placed today.  Plan: QuantiFERON-TB Gold Plus  Eye pain, right: Resolved  H/O total knee replacement, bilateral: Doing well.  She has good range of motion with no discomfort.  No warmth or effusion noted.  She uses a cane to assist with ambulation.  She has not had any recent falls.  Discussed the importance of lower extremity muscle strengthening.  DDD (degenerative disc disease), lumbar: She has occasional discomfort in her lower back.  She is not having any symptoms of radiculopathy.  We discussed the use of a heating pad which may help to alleviate some of her discomfort.  Age-related osteoporosis without current pathological fracture - Scan on 06/19/2017 showed T-score of -3 at left femur neck and no baseline for comparison of BMD change.  She declined ordering an updated DEXA at this time.  She does not want to take any medications for osteoporosis.  Other medical conditions are listed as follows:   History of glaucoma/ patient is legally blind  history of asthmatic bronchitis  History of atherosclerosis  History of memory loss  History of diverticulosis  History of hypothyroidism  History of gastroesophageal reflux (GERD)  Essential hypertension: BP was 117/72 today in the office.    Orders: Orders Placed This Encounter  Procedures   CBC with Differential/Platelet   COMPLETE METABOLIC PANEL WITH GFR   QuantiFERON-TB Gold Plus   No orders of the defined types were placed in this  encounter.     Follow-Up Instructions: Return in about 5 months (around 08/24/2021) for Rheumatoid arthritis, DDD, Osteoporosis.   Gearldine Bienenstock, PA-C  Note - This record has been created using Dragon software.  Chart creation errors have been sought, but may not always  have been located. Such creation errors do not reflect on  the standard of medical care.

## 2021-03-24 ENCOUNTER — Other Ambulatory Visit: Payer: Self-pay

## 2021-03-24 ENCOUNTER — Ambulatory Visit (INDEPENDENT_AMBULATORY_CARE_PROVIDER_SITE_OTHER): Payer: Medicare Other | Admitting: Physician Assistant

## 2021-03-24 ENCOUNTER — Encounter: Payer: Self-pay | Admitting: Physician Assistant

## 2021-03-24 ENCOUNTER — Other Ambulatory Visit: Payer: Self-pay | Admitting: Rheumatology

## 2021-03-24 VITALS — BP 117/72 | HR 87 | Resp 16 | Ht 61.0 in | Wt 109.0 lb

## 2021-03-24 DIAGNOSIS — Z87898 Personal history of other specified conditions: Secondary | ICD-10-CM | POA: Diagnosis not present

## 2021-03-24 DIAGNOSIS — Z8719 Personal history of other diseases of the digestive system: Secondary | ICD-10-CM

## 2021-03-24 DIAGNOSIS — M5136 Other intervertebral disc degeneration, lumbar region: Secondary | ICD-10-CM | POA: Diagnosis not present

## 2021-03-24 DIAGNOSIS — Z96653 Presence of artificial knee joint, bilateral: Secondary | ICD-10-CM | POA: Diagnosis not present

## 2021-03-24 DIAGNOSIS — Z8669 Personal history of other diseases of the nervous system and sense organs: Secondary | ICD-10-CM

## 2021-03-24 DIAGNOSIS — Z111 Encounter for screening for respiratory tuberculosis: Secondary | ICD-10-CM

## 2021-03-24 DIAGNOSIS — Z8679 Personal history of other diseases of the circulatory system: Secondary | ICD-10-CM

## 2021-03-24 DIAGNOSIS — I10 Essential (primary) hypertension: Secondary | ICD-10-CM

## 2021-03-24 DIAGNOSIS — Z8639 Personal history of other endocrine, nutritional and metabolic disease: Secondary | ICD-10-CM

## 2021-03-24 DIAGNOSIS — Z8709 Personal history of other diseases of the respiratory system: Secondary | ICD-10-CM | POA: Diagnosis not present

## 2021-03-24 DIAGNOSIS — Z79899 Other long term (current) drug therapy: Secondary | ICD-10-CM | POA: Diagnosis not present

## 2021-03-24 DIAGNOSIS — M0579 Rheumatoid arthritis with rheumatoid factor of multiple sites without organ or systems involvement: Secondary | ICD-10-CM

## 2021-03-24 DIAGNOSIS — M51369 Other intervertebral disc degeneration, lumbar region without mention of lumbar back pain or lower extremity pain: Secondary | ICD-10-CM

## 2021-03-24 DIAGNOSIS — M810 Age-related osteoporosis without current pathological fracture: Secondary | ICD-10-CM | POA: Diagnosis not present

## 2021-03-24 DIAGNOSIS — H5711 Ocular pain, right eye: Secondary | ICD-10-CM | POA: Diagnosis not present

## 2021-03-24 NOTE — Patient Instructions (Signed)
Standing Labs We placed an order today for your standing lab work.   Please have your standing labs drawn at end of November/beginning of December   If possible, please have your labs drawn 2 weeks prior to your appointment so that the provider can discuss your results at your appointment.  Please note that you may see your imaging and lab results in MyChart before we have reviewed them. We may be awaiting multiple results to interpret others before contacting you. Please allow our office up to 72 hours to thoroughly review all of the results before contacting the office for clarification of your results.  We have open lab daily: Monday through Thursday from 1:30-4:30 PM and Friday from 1:30-4:00 PM at the office of Dr. Pollyann Savoy, [facility].   Please be advised, all patients with office appointments requiring lab work will take precedent over walk-in lab work.  If possible, please come for your lab work on Monday and Friday afternoons, as you may experience shorter wait times. The office is located at [address], [address], [place], [place] [address] No appointment is necessary.   Labs are drawn by [facility]. Please bring your co-pay at the time of your lab draw.  You may receive a bill from [facility] for your lab work.  If you wish to have your labs drawn at another location, please call the office 24 hours in advance to send orders.  If you have any questions regarding directions or hours of operation,  please call [phone].   As a reminder, please drink plenty of water prior to coming for your lab work. Thanks!

## 2021-03-24 NOTE — Telephone Encounter (Signed)
Next Visit: 08/25/2021  Last Visit: 03/24/2021  Last Fill: 12/14/2020  PJ:[id number] arthritis involving multiple sites with positive rheumatoid factor  Current Dose per office note 03/24/2021: Enbrel Sureclick 50 mg sq injections every 7 days.   Labs: 10/01/2020 RBC 5.35, MCV 77, 24.3  TB Gold: 06/11/2020 Neg   Patient was unable to have labs drawn today as Lupita Leash was unable to get any blood from patient.  She is due to come back on Friday to have them drawn.   Okay to refill Enbrel?

## 2021-03-25 ENCOUNTER — Ambulatory Visit: Payer: Medicare Other | Admitting: Physician Assistant

## 2021-03-31 DIAGNOSIS — Z79899 Other long term (current) drug therapy: Secondary | ICD-10-CM | POA: Diagnosis not present

## 2021-04-01 LAB — COMPLETE METABOLIC PANEL WITH GFR
AG Ratio: 1.3 (calc) (ref 1.0–2.5)
ALT: 8 U/L (ref 6–29)
AST: 16 U/L (ref 10–35)
Albumin: 4.3 g/dL (ref 3.6–5.1)
Alkaline phosphatase (APISO): 50 U/L (ref 37–153)
BUN: 20 mg/dL (ref 7–25)
CO2: 30 mmol/L (ref 20–32)
Calcium: 10.1 mg/dL (ref 8.6–10.4)
Chloride: 99 mmol/L (ref 98–110)
Creat: 0.89 mg/dL (ref 0.60–0.95)
Globulin: 3.4 g/dL (calc) (ref 1.9–3.7)
Glucose, Bld: 66 mg/dL (ref 65–99)
Potassium: 5.1 mmol/L (ref 3.5–5.3)
Sodium: 137 mmol/L (ref 135–146)
Total Bilirubin: 0.3 mg/dL (ref 0.2–1.2)
Total Protein: 7.7 g/dL (ref 6.1–8.1)
eGFR: 63 mL/min/{1.73_m2} (ref >=60)

## 2021-04-01 LAB — CBC WITH DIFFERENTIAL/PLATELET
Absolute Monocytes: 977 cells/uL — ABNORMAL HIGH (ref 200–950)
Basophils Absolute: 56 cells/uL (ref 0–200)
Basophils Relative: 0.6 %
Eosinophils Absolute: 74 cells/uL (ref 15–500)
Eosinophils Relative: 0.8 %
HCT: 40.1 % (ref 35.0–45.0)
Hemoglobin: 13.1 g/dL (ref 11.7–15.5)
Lymphs Abs: 3506 cells/uL (ref 850–3900)
MCH: 24.6 pg — ABNORMAL LOW (ref 27.0–33.0)
MCHC: 32.7 g/dL (ref 32.0–36.0)
MCV: 75.4 fL — ABNORMAL LOW (ref 80.0–100.0)
MPV: 11.2 fL (ref 7.5–12.5)
Monocytes Relative: 10.5 %
Neutro Abs: 4687 cells/uL (ref 1500–7800)
Neutrophils Relative %: 50.4 %
Platelets: 243 10*3/uL (ref 140–400)
RBC: 5.32 10*6/uL — ABNORMAL HIGH (ref 3.80–5.10)
RDW: 16.3 % — ABNORMAL HIGH (ref 11.0–15.0)
Total Lymphocyte: 37.7 %
WBC: 9.3 10*3/uL (ref 3.8–10.8)

## 2021-04-01 NOTE — Progress Notes (Signed)
CMP WNL. CBC stable. No medication changes recommended at this time.

## 2021-04-12 ENCOUNTER — Other Ambulatory Visit: Payer: Self-pay | Admitting: Nurse Practitioner

## 2021-04-12 ENCOUNTER — Other Ambulatory Visit: Payer: Self-pay | Admitting: Physician Assistant

## 2021-04-12 DIAGNOSIS — E039 Hypothyroidism, unspecified: Secondary | ICD-10-CM

## 2021-04-12 DIAGNOSIS — I10 Essential (primary) hypertension: Secondary | ICD-10-CM

## 2021-04-12 NOTE — Telephone Encounter (Signed)
Next Visit: 08/25/2021  Last Visit: 03/24/2021  Last Fill:   IT:[id number] arthritis involving multiple sites with positive rheumatoid factor  Current Dose per office note 03/24/2021: Enbrel Sureclick 50 mg sq injections every 7 days.    Labs: 03/31/2021 CMP WNL. CBC stable.  TB Gold: 06/11/2020 Neg    Okay to refill Enbrel?

## 2021-04-14 ENCOUNTER — Ambulatory Visit: Payer: Medicare Other | Admitting: Nurse Practitioner

## 2021-04-16 ENCOUNTER — Telehealth: Payer: Medicare Other

## 2021-04-22 ENCOUNTER — Telehealth: Payer: Medicare Other

## 2021-04-22 ENCOUNTER — Telehealth: Payer: Self-pay

## 2021-04-22 NOTE — Telephone Encounter (Cosign Needed)
  Care Management   Follow Up Note   04/22/2021 Name: Auset Fritzler MRN: [id number] DOB: [birth year]   Referred by: Arnette Felts, FNP Reason for referral : Chronic Care Management (RN CM Follow up call )   An unsuccessful telephone outreach was attempted today. The patient was referred to the case management team for assistance with care management and care coordination.   Follow Up Plan: A HIPPA compliant phone message was left for the patient providing contact information and requesting a return call.   Delsa Sale, RN, BSN, CCM Care Management Coordinator [facility] Care Management/[facility]  Direct Phone: [phone]

## 2021-05-05 ENCOUNTER — Telehealth: Payer: Medicare Other

## 2021-05-18 ENCOUNTER — Telehealth: Payer: Medicare Other

## 2021-05-31 ENCOUNTER — Telehealth: Payer: Medicare Other

## 2021-05-31 ENCOUNTER — Telehealth: Payer: Self-pay

## 2021-05-31 NOTE — Telephone Encounter (Signed)
  Care Management   Follow Up Note   05/31/2021 Name: Jordan [name] MRN: [id number] DOB: September 19, 1933   Referred by: Arnette Felts, FNP Reason for referral : Chronic Care Management (RN CM Follow up call - 2nd attempt )   A second unsuccessful telephone outreach was attempted today. The patient was referred to the case management team for assistance with care management and care coordination.   Follow Up Plan: A HIPPA compliant phone message was left for the patient providing contact information and requesting a return call.   Delsa Sale, RN, BSN, CCM Care Management Coordinator [facility] Care Management/[facility]  Direct Phone: [phone]

## 2021-06-16 ENCOUNTER — Other Ambulatory Visit: Payer: Self-pay | Admitting: Nurse Practitioner

## 2021-06-16 ENCOUNTER — Telehealth: Payer: Medicare Other

## 2021-06-16 ENCOUNTER — Ambulatory Visit: Payer: Self-pay

## 2021-06-16 DIAGNOSIS — M0579 Rheumatoid arthritis with rheumatoid factor of multiple sites without organ or systems involvement: Secondary | ICD-10-CM

## 2021-06-16 DIAGNOSIS — I10 Essential (primary) hypertension: Secondary | ICD-10-CM

## 2021-06-16 DIAGNOSIS — G309 Alzheimer's disease, unspecified: Secondary | ICD-10-CM

## 2021-06-16 DIAGNOSIS — F0280 Dementia in other diseases classified elsewhere without behavioral disturbance: Secondary | ICD-10-CM

## 2021-06-16 DIAGNOSIS — E039 Hypothyroidism, unspecified: Secondary | ICD-10-CM

## 2021-06-16 DIAGNOSIS — E559 Vitamin D deficiency, unspecified: Secondary | ICD-10-CM

## 2021-06-16 NOTE — Chronic Care Management (AMB) (Signed)
  Care Management   Follow Up Note   06/16/2021 Name: Jordan [name] MRN: [id number] DOB: 1933-12-01   Referred by: Arnette Felts, FNP Reason for referral : Chronic Care Management (RN CM Follow up call (3rd attempt))   Third unsuccessful telephone outreach was attempted today. The patient was referred to the case management team for assistance with care management and care coordination. The patient's primary care provider has been notified of our unsuccessful attempts to make or maintain contact with the patient. The care management team is pleased to engage with this patient at any time in the future should he/she be interested in assistance from the care management team.   Follow Up Plan: We have been unable to make contact with the patient for follow up. The care management team is available to follow up with the patient after provider conversation with the patient regarding recommendation for care management engagement and subsequent re-referral to the care management team.   Delsa Sale, RN, BSN, CCM Care Management Coordinator [facility] Care Management/[facility]  Direct Phone: [phone]

## 2021-06-30 ENCOUNTER — Other Ambulatory Visit: Payer: Self-pay | Admitting: Nurse Practitioner

## 2021-06-30 ENCOUNTER — Other Ambulatory Visit: Payer: Self-pay | Admitting: Physician Assistant

## 2021-06-30 DIAGNOSIS — E559 Vitamin D deficiency, unspecified: Secondary | ICD-10-CM

## 2021-06-30 NOTE — Progress Notes (Deleted)
Office Visit Note  Patient: Jordan [name]             Date of Birth: [birth year]           MRN: [id number]             PCP: Arnette Felts, FNP Referring: Arnette Felts, FNP Visit Date: 07/14/2021 Occupation: @GUAROCC @  Subjective:  No chief complaint on file.   History of Present Illness: Jordan [name] is a 87 y.o. female ***   Activities of Daily Living:  Patient reports morning stiffness for *** {minute/hour:19697}.   Patient {ACTIONS;DENIES/REPORTS:21021675::"Denies"} nocturnal pain.  Difficulty dressing/grooming: {ACTIONS;DENIES/REPORTS:21021675::"Denies"} Difficulty climbing stairs: {ACTIONS;DENIES/REPORTS:21021675::"Denies"} Difficulty getting out of chair: {ACTIONS;DENIES/REPORTS:21021675::"Denies"} Difficulty using hands for taps, buttons, cutlery, and/or writing: {ACTIONS;DENIES/REPORTS:21021675::"Denies"}  No Rheumatology ROS completed.   PMFS History:  Patient Active Problem List   Diagnosis Date Noted   Chest pain    Hypotension 03/24/2020   Alzheimer's disease (HCC) 08/13/2019   Essential hypertension 08/13/2018   Acquired hypothyroidism 08/13/2018   Rash and nonspecific skin eruption 08/13/2018   Memory loss 04/12/2017   Depression 04/12/2017   Rheumatoid arthritis involving multiple sites with positive rheumatoid factor (HCC) 08/17/2016   High risk medication use 08/17/2016   H/O total knee replacement, bilateral 08/17/2016   Spondylosis of lumbar region without myelopathy or radiculopathy 08/17/2016   History of glaucoma/ patient is legally blind 08/17/2016   History of gastroesophageal reflux (GERD) 08/17/2016   History of diverticulosis 08/17/2016   History of hypothyroidism 08/17/2016   history of asthmatic bronchitis 08/17/2016   History of memory loss 08/17/2016   History of atherosclerosis 08/17/2016    Past Medical History:  Diagnosis Date   Allergy    Anxiety    per pt's daughter   Arthritis    Asthma    pt reported   Depression     GERD (gastroesophageal reflux disease)    Hearing deficit L   High cholesterol    Hypertension    Osteoporosis    Rheumatoid arthritis (HCC)    Stroke (HCC)     No family history on file. Past Surgical History:  Procedure Laterality Date   ABDOMINAL HYSTERECTOMY     KNEE ARTHROPLASTY     REPLACEMENT TOTAL KNEE BILATERAL Bilateral    WRIST SURGERY Left    Social History   Social History Narrative   Lives at home with grandson.   Right-handed.   2-3 cups coffee per day.   Immunization History  Administered Date(s) Administered   Fluad Quad(high Dose 65+) 04/06/2020   Moderna SARS-COV2 Booster Vaccination 03/16/2021   Moderna Sars-Covid-2 Vaccination 09/26/2019, 10/24/2019     Objective: Vital Signs: There were no vitals taken for this visit.   Physical Exam   Musculoskeletal Exam: ***  CDAI Exam: CDAI Score: -- Patient Global: --; Provider Global: -- Swollen: --; Tender: -- Joint Exam 07/14/2021   No joint exam has been documented for this visit   There is currently no information documented on the homunculus. Go to the Rheumatology activity and complete the homunculus joint exam.  Investigation: No additional findings.  Imaging: No results found.  Recent Labs: Lab Results  Component Value Date   WBC 9.3 03/31/2021   HGB 13.1 03/31/2021   PLT 243 03/31/2021   NA 137 03/31/2021   K 5.1 03/31/2021   CL 99 03/31/2021   CO2 30 03/31/2021   GLUCOSE 66 03/31/2021   BUN 20 03/31/2021   CREATININE 0.89 03/31/2021   BILITOT 0.3 03/31/2021  ALKPHOS 63 09/28/2020   AST 16 03/31/2021   ALT 8 03/31/2021   PROT 7.7 03/31/2021   ALBUMIN 4.2 09/28/2020   CALCIUM 10.1 03/31/2021   GFRAA 89 06/11/2020   QFTBGOLDPLUS NEGATIVE 06/11/2020    Speciality Comments: No specialty comments available.  Procedures:  No procedures performed Allergies: Penicillins   Assessment / Plan:     Visit Diagnoses: No diagnosis found.  Orders: No orders of the defined  types were placed in this encounter.  No orders of the defined types were placed in this encounter.   Face-to-face time spent with patient was *** minutes. Greater than 50% of time was spent in counseling and coordination of care.  Follow-Up Instructions: No follow-ups on file.   Earnestine Mealing, CMA  Note - This record has been created using Editor, commissioning.  Chart creation errors have been sought, but may not always  have been located. Such creation errors do not reflect on  the standard of medical care.

## 2021-06-30 NOTE — Telephone Encounter (Signed)
Next Visit: 07/14/2021  Last Visit: 03/24/2021  Last Fill: 04/12/2021   [id number] arthritis involving multiple sites with positive rheumatoid factor  Current Dose per office note 03/24/2021: Enbrel Sureclick 50 mg sq injections every 7 days.  Labs: 03/31/2021 CMP WNL. CBC stable.  TB Gold: 06/11/2020 Neg    Patient's daughter advised patient is due to update labs. She will advise patient and have her come to update.   Okay to refill Enbrel?

## 2021-07-12 NOTE — Progress Notes (Signed)
Office Visit Note  Patient: Jordan [name]             Date of Birth: [birth year]           MRN: [id number]             PCP: Arnette Felts, FNP Referring: Arnette Felts, FNP Visit Date: 07/15/2021 Occupation: @GUAROCC @  Subjective:  Medication management.   History of Present Illness: Jordan [name] is a 87 y.o. female with a history of rheumatoid arthritis, osteoarthritis, degenerative disc disease and osteoporosis.  She is accompanied by her daughter 83 today.  According to the patient and her daughter she has been doing well without any increased joint pain or joint swelling.  She has been tolerating Enbrel injections weekly without any side effects.  She has difficulty climbing the stairs and getting dressed.  She has some stiffness in the morning which lasts only for a short while.  Activities of Daily Living:  Patient reports morning stiffness for 30 minutes.   Patient Denies nocturnal pain.  Difficulty dressing/grooming: Reports Difficulty climbing stairs: Reports Difficulty getting out of chair: Denies Difficulty using hands for taps, buttons, cutlery, and/or writing: Denies  Review of Systems  Constitutional:  Negative for fatigue.  HENT:  Negative for mouth sores, mouth dryness and nose dryness.   Eyes:  Positive for discharge. Negative for itching and dryness.  Respiratory:  Negative for shortness of breath and difficulty breathing.   Cardiovascular:  Negative for chest pain and palpitations.  Gastrointestinal:  Negative for constipation and diarrhea.  Endocrine: Negative for increased urination.  Genitourinary:  Negative for difficulty urinating.  Musculoskeletal:  Positive for morning stiffness. Negative for joint pain, joint pain, joint swelling, myalgias, muscle tenderness and myalgias.  Skin:  Negative for color change, rash and redness.  Allergic/Immunologic: Negative for susceptible to infections.  Neurological:  Positive for memory loss. Negative for  dizziness, numbness, headaches and weakness.  Hematological:  Negative for bruising/bleeding tendency.  Psychiatric/Behavioral:  Positive for confusion and sleep disturbance.    PMFS History:  Patient Active Problem List   Diagnosis Date Noted   Chest pain    Hypotension 03/24/2020   Alzheimer's disease (HCC) 08/13/2019   Essential hypertension 08/13/2018   Acquired hypothyroidism 08/13/2018   Rash and nonspecific skin eruption 08/13/2018   Memory loss 04/12/2017   Depression 04/12/2017   Rheumatoid arthritis involving multiple sites with positive rheumatoid factor (HCC) 08/17/2016   High risk medication use 08/17/2016   H/O total knee replacement, bilateral 08/17/2016   Spondylosis of lumbar region without myelopathy or radiculopathy 08/17/2016   History of glaucoma/ patient is legally blind 08/17/2016   History of gastroesophageal reflux (GERD) 08/17/2016   History of diverticulosis 08/17/2016   History of hypothyroidism 08/17/2016   history of asthmatic bronchitis 08/17/2016   History of memory loss 08/17/2016   History of atherosclerosis 08/17/2016    Past Medical History:  Diagnosis Date   Allergy    Anxiety    per pt's daughter   Arthritis    Asthma    pt reported   Depression    GERD (gastroesophageal reflux disease)    Hearing deficit L   High cholesterol    Hypertension    Osteoporosis    Rheumatoid arthritis (HCC)    Stroke (HCC)     History reviewed. No pertinent family history. Past Surgical History:  Procedure Laterality Date   ABDOMINAL HYSTERECTOMY     KNEE ARTHROPLASTY     REPLACEMENT TOTAL KNEE BILATERAL Bilateral  WRIST SURGERY Left    Social History   Social History Narrative   Lives at home with grandson.   Right-handed.   2-3 cups coffee per day.   Immunization History  Administered Date(s) Administered   Fluad Quad(high Dose 65+) 04/06/2020   Moderna SARS-COV2 Booster Vaccination 03/16/2021   Moderna Sars-Covid-2 Vaccination  09/26/2019, 10/24/2019     Objective: Vital Signs: BP 117/76 (BP Location: Left Arm, Patient Position: Sitting, Cuff Size: Normal)    Pulse (!) 57    Ht 5\' 1"  (1.549 m)    Wt 121 lb (54.9 kg)    BMI 22.86 kg/m    Physical Exam Vitals and nursing note reviewed.  Constitutional:      Appearance: She is well-developed.  HENT:     Head: Normocephalic and atraumatic.  Eyes:     Conjunctiva/sclera: Conjunctivae normal.  Cardiovascular:     Rate and Rhythm: Normal rate and regular rhythm.     Heart sounds: Normal heart sounds.  Pulmonary:     Effort: Pulmonary effort is normal.     Breath sounds: Normal breath sounds.  Abdominal:     General: Bowel sounds are normal.     Palpations: Abdomen is soft.  Musculoskeletal:     Cervical back: Normal range of motion.  Lymphadenopathy:     Cervical: No cervical adenopathy.  Skin:    General: Skin is warm and dry.     Capillary Refill: Capillary refill takes less than 2 seconds.  Neurological:     Mental Status: She is alert and oriented to person, place, and time.  Psychiatric:        Behavior: Behavior normal.     Musculoskeletal Exam: C-spine was in good range of motion.  She had no difficulty with shoulder joint abduction.  Elbow joints and wrist joints are in good range of motion.  She had no synovitis over MCPs PIPs or DIPs.  Bilateral PIP and DIP thickening was noted.  Hip joints were difficult to assess in the sitting position.  Knee joints with good range of motion.  There was no tenderness over ankles or MTPs.  She mobilizes with the help of cane.  CDAI Exam: CDAI Score: 0.2  Patient Global: 1 mm; Provider Global: 1 mm Swollen: 0 ; Tender: 0  Joint Exam 07/15/2021   No joint exam has been documented for this visit   There is currently no information documented on the homunculus. Go to the Rheumatology activity and complete the homunculus joint exam.  Investigation: No additional findings.  Imaging: No results  found.  Recent Labs: Lab Results  Component Value Date   WBC 9.3 03/31/2021   HGB 13.1 03/31/2021   PLT 243 03/31/2021   NA 137 03/31/2021   K 5.1 03/31/2021   CL 99 03/31/2021   CO2 30 03/31/2021   GLUCOSE 66 03/31/2021   BUN 20 03/31/2021   CREATININE 0.89 03/31/2021   BILITOT 0.3 03/31/2021   ALKPHOS 63 09/28/2020   AST 16 03/31/2021   ALT 8 03/31/2021   PROT 7.7 03/31/2021   ALBUMIN 4.2 09/28/2020   CALCIUM 10.1 03/31/2021   GFRAA 89 06/11/2020   QFTBGOLDPLUS NEGATIVE 06/11/2020    Speciality Comments: No specialty comments available.  Procedures:  No procedures performed Allergies: Penicillins   Assessment / Plan:     Visit Diagnoses: Rheumatoid arthritis involving multiple sites with positive rheumatoid factor (HCC)-she had no synovitis on examination.  All of the joints were in good range of motion without  any synovitis.  She has been tolerating Enbrel injections without any side effects.  High risk medication use - Enbrel SureClick 50 mg subcu q. 7 days. CBC normal, CMP normal were normal on March 31, 2021.  TB Gold - 06/11/2020. - Plan: CBC with Differential/Platelet, COMPLETE METABOLIC PANEL WITH GFR, QuantiFERON-TB Gold Plus today.  She has been advised to get labs every 3 months.  Information regarding immunization was placed in the AVS.  She has been advised to hold Enbrel in case she develops an infection and restart after the infection resolves.  She was also advised to get annual skin examination to monitor for skin cancer.  Nancy Fetter protection was advised.  H/O total knee replacement, bilateral-doing well.  She mobilizes with the help of a cane.  DDD (degenerative disc disease), lumbar-she denies any discomfort today.  Age-related osteoporosis without current pathological fracture-she has history of osteoporosis.  She declined repeat DEXA and does not want to take any treatment.  Other medical problems are listed as follows:  History of glaucoma/ patient is  legally blind  History of atherosclerosis  Essential hypertension  history of asthmatic bronchitis  History of diverticulosis  History of gastroesophageal reflux (GERD)  History of hypothyroidism  History of memory loss  Orders: Orders Placed This Encounter  Procedures   CBC with Differential/Platelet   COMPLETE METABOLIC PANEL WITH GFR   QuantiFERON-TB Gold Plus   No orders of the defined types were placed in this encounter.    Follow-Up Instructions: Return in about 3 months (around 10/13/2021) for Rheumatoid arthritis.   Bo Merino, MD  Note - This record has been created using Editor, commissioning.  Chart creation errors have been sought, but may not always  have been located. Such creation errors do not reflect on  the standard of medical care.

## 2021-07-14 ENCOUNTER — Other Ambulatory Visit: Payer: Self-pay | Admitting: Nurse Practitioner

## 2021-07-14 ENCOUNTER — Ambulatory Visit: Payer: Medicare Other | Admitting: Physician Assistant

## 2021-07-14 DIAGNOSIS — M5136 Other intervertebral disc degeneration, lumbar region: Secondary | ICD-10-CM

## 2021-07-14 DIAGNOSIS — Z8709 Personal history of other diseases of the respiratory system: Secondary | ICD-10-CM

## 2021-07-14 DIAGNOSIS — I10 Essential (primary) hypertension: Secondary | ICD-10-CM

## 2021-07-14 DIAGNOSIS — M0579 Rheumatoid arthritis with rheumatoid factor of multiple sites without organ or systems involvement: Secondary | ICD-10-CM

## 2021-07-14 DIAGNOSIS — Z96653 Presence of artificial knee joint, bilateral: Secondary | ICD-10-CM

## 2021-07-14 DIAGNOSIS — M810 Age-related osteoporosis without current pathological fracture: Secondary | ICD-10-CM

## 2021-07-14 DIAGNOSIS — Z87898 Personal history of other specified conditions: Secondary | ICD-10-CM

## 2021-07-14 DIAGNOSIS — Z79899 Other long term (current) drug therapy: Secondary | ICD-10-CM

## 2021-07-14 DIAGNOSIS — Z8669 Personal history of other diseases of the nervous system and sense organs: Secondary | ICD-10-CM

## 2021-07-14 DIAGNOSIS — Z8679 Personal history of other diseases of the circulatory system: Secondary | ICD-10-CM

## 2021-07-14 DIAGNOSIS — H5711 Ocular pain, right eye: Secondary | ICD-10-CM

## 2021-07-14 DIAGNOSIS — Z8639 Personal history of other endocrine, nutritional and metabolic disease: Secondary | ICD-10-CM

## 2021-07-14 DIAGNOSIS — Z8719 Personal history of other diseases of the digestive system: Secondary | ICD-10-CM

## 2021-07-15 ENCOUNTER — Ambulatory Visit (INDEPENDENT_AMBULATORY_CARE_PROVIDER_SITE_OTHER): Payer: Medicare Other | Admitting: Rheumatology

## 2021-07-15 ENCOUNTER — Other Ambulatory Visit: Payer: Self-pay

## 2021-07-15 ENCOUNTER — Encounter: Payer: Self-pay | Admitting: Rheumatology

## 2021-07-15 VITALS — BP 117/76 | HR 57 | Ht 61.0 in | Wt 121.0 lb

## 2021-07-15 DIAGNOSIS — Z8679 Personal history of other diseases of the circulatory system: Secondary | ICD-10-CM

## 2021-07-15 DIAGNOSIS — Z8639 Personal history of other endocrine, nutritional and metabolic disease: Secondary | ICD-10-CM

## 2021-07-15 DIAGNOSIS — M810 Age-related osteoporosis without current pathological fracture: Secondary | ICD-10-CM

## 2021-07-15 DIAGNOSIS — Z87898 Personal history of other specified conditions: Secondary | ICD-10-CM | POA: Diagnosis not present

## 2021-07-15 DIAGNOSIS — M5136 Other intervertebral disc degeneration, lumbar region: Secondary | ICD-10-CM

## 2021-07-15 DIAGNOSIS — M0579 Rheumatoid arthritis with rheumatoid factor of multiple sites without organ or systems involvement: Secondary | ICD-10-CM

## 2021-07-15 DIAGNOSIS — Z79899 Other long term (current) drug therapy: Secondary | ICD-10-CM | POA: Diagnosis not present

## 2021-07-15 DIAGNOSIS — Z8669 Personal history of other diseases of the nervous system and sense organs: Secondary | ICD-10-CM

## 2021-07-15 DIAGNOSIS — Z8719 Personal history of other diseases of the digestive system: Secondary | ICD-10-CM | POA: Diagnosis not present

## 2021-07-15 DIAGNOSIS — Z96653 Presence of artificial knee joint, bilateral: Secondary | ICD-10-CM

## 2021-07-15 DIAGNOSIS — I10 Essential (primary) hypertension: Secondary | ICD-10-CM

## 2021-07-15 DIAGNOSIS — Z8709 Personal history of other diseases of the respiratory system: Secondary | ICD-10-CM

## 2021-07-15 NOTE — Patient Instructions (Signed)
Standing Labs °We placed an order today for your standing lab work.  ° °Please have your standing labs drawn in March and every 3 months ° °If possible, please have your labs drawn 2 weeks prior to your appointment so that the provider can discuss your results at your appointment. ° °Please note that you may see your imaging and lab results in MyChart before we have reviewed them. °We may be awaiting multiple results to interpret others before contacting you. °Please allow our office up to 72 hours to thoroughly review all of the results before contacting the office for clarification of your results. ° °We have open lab daily: °Monday through Thursday from 1:30-4:30 PM and Friday from 1:30-4:00 PM °at the office of Dr. Mitcheal Sweetin, [facility].   °Please be advised, all patients with office appointments requiring lab work will take precedent over walk-in lab work.  °If possible, please come for your lab work on Monday and Friday afternoons, as you may experience shorter wait times. °The office is located at [address], [address], [place], [place] [address] °No appointment is necessary.   °Labs are drawn by [facility]. Please bring your co-pay at the time of your lab draw.  You may receive a bill from [facility] for your lab work. ° °If you wish to have your labs drawn at another location, please call the office 24 hours in advance to send orders. ° °If you have any questions regarding directions or hours of operation,  °please call [phone].   °As a reminder, please drink plenty of water prior to coming for your lab work. Thanks!  ° °Vaccines °You are taking a medication(s) that can suppress your immune system.  The following immunizations are recommended: °Flu annually °Covid-19  °Td/Tdap (tetanus, diphtheria, pertussis) every 10 years °Pneumonia (Prevnar 15 then Pneumovax 23 at least 1 year apart.  Alternatively, can take Prevnar 20 without needing additional dose) °Shingrix: 2 doses from 4 weeks  to 6 months apart ° °Please check with your PCP to make sure you are up to date.  ° °If you have signs or symptoms of an infection or start antibiotics: °First, call your PCP for workup of your infection. °Hold your medication through the infection, until you complete your antibiotics, and until symptoms resolve if you take the following: °Injectable medication (Actemra, Benlysta, Cimzia, Cosentyx, Enbrel, Humira, Kevzara, Orencia, Remicade, Simponi, Stelara, Taltz, Tremfya) °Methotrexate °Leflunomide (Arava) °Mycophenolate (Cellcept) °Xeljanz, Olumiant, or Rinvoq  °Please get annual skin examination to screen for skin cancer while you are on Enbrel °

## 2021-07-16 NOTE — Progress Notes (Signed)
MCV is low and stable.  Creatinine is elevated.  Please advise patient to increase water intake.

## 2021-07-18 LAB — COMPLETE METABOLIC PANEL WITH GFR
AG Ratio: 1.4 (calc) (ref 1.0–2.5)
ALT: 9 U/L (ref 6–29)
AST: 14 U/L (ref 10–35)
Albumin: 4 g/dL (ref 3.6–5.1)
Alkaline phosphatase (APISO): 45 U/L (ref 37–153)
BUN/Creatinine Ratio: 23 (calc) — ABNORMAL HIGH (ref 6–22)
BUN: 24 mg/dL (ref 7–25)
CO2: 30 mmol/L (ref 20–32)
Calcium: 9.4 mg/dL (ref 8.6–10.4)
Chloride: 103 mmol/L (ref 98–110)
Creat: 1.06 mg/dL — ABNORMAL HIGH (ref 0.60–0.95)
Globulin: 2.9 g/dL (calc) (ref 1.9–3.7)
Glucose, Bld: 81 mg/dL (ref 65–99)
Potassium: 5 mmol/L (ref 3.5–5.3)
Sodium: 140 mmol/L (ref 135–146)
Total Bilirubin: 0.4 mg/dL (ref 0.2–1.2)
Total Protein: 6.9 g/dL (ref 6.1–8.1)
eGFR: 51 mL/min/{1.73_m2} — ABNORMAL LOW (ref >=60)

## 2021-07-18 LAB — CBC WITH DIFFERENTIAL/PLATELET
Absolute Monocytes: 796 cells/uL (ref 200–950)
Basophils Absolute: 51 cells/uL (ref 0–200)
Basophils Relative: 0.7 %
Eosinophils Absolute: 80 cells/uL (ref 15–500)
Eosinophils Relative: 1.1 %
HCT: 39.3 % (ref 35.0–45.0)
Hemoglobin: 12.7 g/dL (ref 11.7–15.5)
Lymphs Abs: 3343 cells/uL (ref 850–3900)
MCH: 24.8 pg — ABNORMAL LOW (ref 27.0–33.0)
MCHC: 32.3 g/dL (ref 32.0–36.0)
MCV: 76.8 fL — ABNORMAL LOW (ref 80.0–100.0)
MPV: 11.7 fL (ref 7.5–12.5)
Monocytes Relative: 10.9 %
Neutro Abs: 3030 cells/uL (ref 1500–7800)
Neutrophils Relative %: 41.5 %
Platelets: 215 10*3/uL (ref 140–400)
RBC: 5.12 10*6/uL — ABNORMAL HIGH (ref 3.80–5.10)
RDW: 15.3 % — ABNORMAL HIGH (ref 11.0–15.0)
Total Lymphocyte: 45.8 %
WBC: 7.3 10*3/uL (ref 3.8–10.8)

## 2021-07-18 LAB — QUANTIFERON-TB GOLD PLUS
Mitogen-NIL: >10 IU/mL
NIL: 0.06 IU/mL
QuantiFERON-TB Gold Plus: NEGATIVE
TB1-NIL: 0 IU/mL
TB2-NIL: 0 IU/mL

## 2021-07-19 NOTE — Progress Notes (Signed)
TB Gold is negative.

## 2021-07-20 DIAGNOSIS — Z1231 Encounter for screening mammogram for malignant neoplasm of breast: Secondary | ICD-10-CM | POA: Diagnosis not present

## 2021-07-20 DIAGNOSIS — M8588 Other specified disorders of bone density and structure, other site: Secondary | ICD-10-CM | POA: Diagnosis not present

## 2021-07-20 DIAGNOSIS — M810 Age-related osteoporosis without current pathological fracture: Secondary | ICD-10-CM | POA: Diagnosis not present

## 2021-07-20 DIAGNOSIS — Z780 Asymptomatic menopausal state: Secondary | ICD-10-CM | POA: Diagnosis not present

## 2021-07-20 LAB — HM MAMMOGRAPHY

## 2021-07-20 LAB — HM DEXA SCAN

## 2021-07-20 NOTE — Progress Notes (Signed)
She is scheduled to come in January, see if there is an earlier appt so we can recheck her kidney functions after she has increased her water intake.

## 2021-07-21 ENCOUNTER — Encounter: Payer: Self-pay | Admitting: Nurse Practitioner

## 2021-08-09 ENCOUNTER — Other Ambulatory Visit: Payer: Self-pay | Admitting: Nurse Practitioner

## 2021-08-09 ENCOUNTER — Other Ambulatory Visit: Payer: Self-pay | Admitting: Physician Assistant

## 2021-08-09 NOTE — Telephone Encounter (Signed)
Next Visit: 10/13/2021  Last Visit: 07/15/2022  Last Fill: 06/30/2021 (30 day supply)  [id number] arthritis involving multiple sites with positive rheumatoid factor   Current Dose per office note [id number]: Enbrel SureClick 50 mg subcu q. 7 days  Labs: 07/15/2021 MCV is low and stable.  Creatinine is elevated  TB Gold: 07/15/2021 Neg   Okay to refill Enbrel?

## 2021-08-24 ENCOUNTER — Ambulatory Visit: Payer: Medicare Other | Admitting: Nurse Practitioner

## 2021-08-24 NOTE — Progress Notes (Deleted)
I,Nelia Rogoff T Maryann Mccall,acting as a Neurosurgeonscribe for Arnette FeltsJanece Moore, FNP.,have documented all relevant documentation on the behalf of Arnette FeltsJanece Moore, FNP,as directed by  Arnette FeltsJanece Moore, FNP while in the presence of Arnette FeltsJanece Moore, FNP.  This visit occurred during the SARS-CoV-2 public health emergency.  Safety protocols were in place, including screening questions prior to the visit, additional usage of staff PPE, and extensive cleaning of exam room while observing appropriate contact time as indicated for disinfecting solutions.  Subjective:     Patient ID: Jordan [name] , female    DOB: 08/16/1933 , 88 y.o.   MRN: [id number]   No chief complaint on file.   HPI  HPI   Past Medical History:  Diagnosis Date   Allergy    Anxiety    per pt's daughter   Arthritis    Asthma    pt reported   Depression    GERD (gastroesophageal reflux disease)    Hearing deficit L   High cholesterol    Hypertension    Osteoporosis    Rheumatoid arthritis (HCC)    Stroke (HCC)      No family history on file.   Current Outpatient Medications:    albuterol (VENTOLIN HFA) 108 (90 Base) MCG/ACT inhaler, Inhale 1 puff into the lungs every 6 (six) hours as needed for wheezing or shortness of breath., Disp: 6.7 g, Rfl: 5   ALLERGY RELIEF 10 MG tablet, TAKE 1 TABLET BY MOUTH EVERY DAY, Disp: 30 tablet, Rfl: 1   amLODipine (NORVASC) 5 MG tablet, TAKE 1 TABLET BY MOUTH EVERY DAY, Disp: 30 tablet, Rfl: 1   aspirin EC 81 MG tablet, Take 81 mg by mouth daily. Swallow whole., Disp: , Rfl:    atropine 1 % ophthalmic solution, Place 1 drop into the left eye daily., Disp: , Rfl:    ENBREL SURECLICK 50 MG/ML injection, INJECT 0.2098mls (50MG  total) SUBCUTANEOUSLY ONCE WEEKLY., Disp: 12 mL, Rfl: 0   feeding supplement, ENSURE ENLIVE, (ENSURE ENLIVE) LIQD, Take 237 mLs by mouth daily in the afternoon., Disp: , Rfl:    ferrous sulfate 325 (65 FE) MG tablet, Take 325 mg by mouth daily with breakfast., Disp: , Rfl:    fluticasone  (FLONASE) 50 MCG/ACT nasal spray, INSTILL 2 SPRAYS IN EACH NOSTRIL EVERY DAY (Patient taking differently: Place 2 sprays into both nostrils daily as needed for allergies.), Disp: 16 g, Rfl: 1   folic acid (FOLVITE) 1 MG tablet, TAKE 1 TABLET(1 MG) BY MOUTH DAILY (Patient taking differently: Take 1 mg by mouth daily.), Disp: 90 tablet, Rfl: 0   latanoprost (XALATAN) 0.005 % ophthalmic solution, Place 1 drop into both eyes at bedtime. , Disp: , Rfl:    NAMZARIC 28-10 MG CP24, TAKE 1 CAPSULE BY MOUTH EVERY DAY, Disp: 90 capsule, Rfl: 1   omeprazole (PRILOSEC) 20 MG capsule, TAKE 1 CAPSULE BY MOUTH EVERY DAY, Disp: 90 capsule, Rfl: 1   QUEtiapine (SEROQUEL) 100 MG tablet, TAKE 1 TABLET BY MOUTH AT BEDTIME, Disp: 90 tablet, Rfl: 1   simvastatin (ZOCOR) 20 MG tablet, TAKE 1 TABLET BY MOUTH EVERY EVENING, Disp: 90 tablet, Rfl: 1   SYNTHROID 75 MCG tablet, TAKE 1 TABLET BY MOUTH EVERY DAY BEFORE BREAKFAST, Disp: 90 tablet, Rfl: 1   timolol (TIMOPTIC) 0.5 % ophthalmic solution, 1 drop 2 (two) times daily., Disp: , Rfl:    Vitamin D, Ergocalciferol, (DRISDOL) 1.25 MG (50000 UNIT) CAPS capsule, TAKE 1 CAPSULE BY MOUTH TWICE A WEEK, Disp: 24 capsule, Rfl: 1  Allergies  Allergen Reactions   Penicillins Hives     Review of Systems   There were no vitals filed for this visit. There is no height or weight on file to calculate BMI.   Objective:  Physical Exam      Assessment And Plan:     There are no diagnoses linked to this encounter.    Patient was given opportunity to ask questions. Patient verbalized understanding of the plan and was able to repeat key elements of the plan. All questions were answered to their satisfaction.  Coolidge Breeze, CMA   I, Coolidge Breeze, CMA, have reviewed all documentation for this visit. The documentation on 08/24/21 for the exam, diagnosis, procedures, and orders are all accurate and complete.   IF YOU HAVE BEEN REFERRED TO A SPECIALIST, IT MAY TAKE  1-2 WEEKS TO SCHEDULE/PROCESS THE REFERRAL. IF YOU HAVE NOT HEARD FROM US/SPECIALIST IN TWO WEEKS, PLEASE GIVE [place] A CALL AT [phone] X [phone].   THE PATIENT IS ENCOURAGED TO PRACTICE SOCIAL DISTANCING DUE TO THE COVID-19 PANDEMIC.

## 2021-08-25 ENCOUNTER — Ambulatory Visit: Payer: Medicare Other | Admitting: Rheumatology

## 2021-09-06 ENCOUNTER — Other Ambulatory Visit: Payer: Self-pay | Admitting: Nurse Practitioner

## 2021-09-06 DIAGNOSIS — G4700 Insomnia, unspecified: Secondary | ICD-10-CM

## 2021-09-23 ENCOUNTER — Other Ambulatory Visit: Payer: Self-pay | Admitting: Nurse Practitioner

## 2021-09-23 DIAGNOSIS — I10 Essential (primary) hypertension: Secondary | ICD-10-CM

## 2021-09-29 NOTE — Progress Notes (Unsigned)
Office Visit Note  Patient: Jordan [name]             Date of Birth: Mar 05, 1934           MRN: [id number]             PCP: Arnette Felts, FNP Referring: Arnette Felts, FNP Visit Date: 10/13/2021 Occupation: @GUAROCC @  Subjective:  No chief complaint on file.   History of Present Illness: Jordan [name] is a 88 y.o. female ***   Activities of Daily Living:  Patient reports morning stiffness for *** {minute/hour:19697}.   Patient {ACTIONS;DENIES/REPORTS:21021675::"Denies"} nocturnal pain.  Difficulty dressing/grooming: {ACTIONS;DENIES/REPORTS:21021675::"Denies"} Difficulty climbing stairs: {ACTIONS;DENIES/REPORTS:21021675::"Denies"} Difficulty getting out of chair: {ACTIONS;DENIES/REPORTS:21021675::"Denies"} Difficulty using hands for taps, buttons, cutlery, and/or writing: {ACTIONS;DENIES/REPORTS:21021675::"Denies"}  No Rheumatology ROS completed.   PMFS History:  Patient Active Problem List   Diagnosis Date Noted   Chest pain    Hypotension 03/24/2020   Alzheimer's disease (HCC) 08/13/2019   Essential hypertension 08/13/2018   Acquired hypothyroidism 08/13/2018   Rash and nonspecific skin eruption 08/13/2018   Memory loss 04/12/2017   Depression 04/12/2017   Rheumatoid arthritis involving multiple sites with positive rheumatoid factor (HCC) 08/17/2016   High risk medication use 08/17/2016   H/O total knee replacement, bilateral 08/17/2016   Spondylosis of lumbar region without myelopathy or radiculopathy 08/17/2016   History of glaucoma/ patient is legally blind 08/17/2016   History of gastroesophageal reflux (GERD) 08/17/2016   History of diverticulosis 08/17/2016   History of hypothyroidism 08/17/2016   history of asthmatic bronchitis 08/17/2016   History of memory loss 08/17/2016   History of atherosclerosis 08/17/2016    Past Medical History:  Diagnosis Date   Allergy    Anxiety    per pt's daughter   Arthritis    Asthma    pt reported   Depression     GERD (gastroesophageal reflux disease)    Hearing deficit L   High cholesterol    Hypertension    Osteoporosis    Rheumatoid arthritis (HCC)    Stroke (HCC)     No family history on file. Past Surgical History:  Procedure Laterality Date   ABDOMINAL HYSTERECTOMY     KNEE ARTHROPLASTY     REPLACEMENT TOTAL KNEE BILATERAL Bilateral    WRIST SURGERY Left    Social History   Social History Narrative   Lives at home with grandson.   Right-handed.   2-3 cups coffee per day.   Immunization History  Administered Date(s) Administered   Fluad Quad(high Dose 65+) 04/06/2020   Moderna SARS-COV2 Booster Vaccination 03/16/2021   Moderna Sars-Covid-2 Vaccination 09/26/2019, 10/24/2019     Objective: Vital Signs: There were no vitals taken for this visit.   Physical Exam   Musculoskeletal Exam: ***  CDAI Exam: CDAI Score: -- Patient Global: --; Provider Global: -- Swollen: --; Tender: -- Joint Exam 10/13/2021   No joint exam has been documented for this visit   There is currently no information documented on the homunculus. Go to the Rheumatology activity and complete the homunculus joint exam.  Investigation: No additional findings.  Imaging: No results found.  Recent Labs: Lab Results  Component Value Date   WBC 7.3 07/15/2021   HGB 12.7 07/15/2021   PLT 215 07/15/2021   NA 140 07/15/2021   K 5.0 07/15/2021   CL 103 07/15/2021   CO2 30 07/15/2021   GLUCOSE 81 07/15/2021   BUN 24 07/15/2021   CREATININE 1.06 (H) 07/15/2021   BILITOT 0.4 07/15/2021  ALKPHOS 63 09/28/2020   AST 14 07/15/2021   ALT 9 07/15/2021   PROT 6.9 07/15/2021   ALBUMIN 4.2 09/28/2020   CALCIUM 9.4 07/15/2021   GFRAA 89 06/11/2020   QFTBGOLDPLUS NEGATIVE 07/15/2021    Speciality Comments: No specialty comments available.  Procedures:  No procedures performed Allergies: Penicillins   Assessment / Plan:     Visit Diagnoses: Rheumatoid arthritis involving multiple sites with  positive rheumatoid factor (HCC)  High risk medication use  H/O total knee replacement, bilateral  DDD (degenerative disc disease), lumbar  Age-related osteoporosis without current pathological fracture  History of glaucoma/ patient is legally blind  History of atherosclerosis  Essential hypertension  history of asthmatic bronchitis  History of diverticulosis  History of gastroesophageal reflux (GERD)  History of hypothyroidism  History of memory loss  Orders: No orders of the defined types were placed in this encounter.  No orders of the defined types were placed in this encounter.   Face-to-face time spent with patient was *** minutes. Greater than 50% of time was spent in counseling and coordination of care.  Follow-Up Instructions: No follow-ups on file.   Gearldine Bienenstockaylor M Sailor Hevia, PA-C  Note - This record has been created using Dragon software.  Chart creation errors have been sought, but may not always  have been located. Such creation errors do not reflect on  the standard of medical care.

## 2021-10-04 ENCOUNTER — Other Ambulatory Visit: Payer: Self-pay | Admitting: Nurse Practitioner

## 2021-10-04 DIAGNOSIS — E039 Hypothyroidism, unspecified: Secondary | ICD-10-CM

## 2021-10-11 DIAGNOSIS — H401113 Primary open-angle glaucoma, right eye, severe stage: Secondary | ICD-10-CM | POA: Diagnosis not present

## 2021-10-13 ENCOUNTER — Other Ambulatory Visit: Payer: Self-pay

## 2021-10-13 ENCOUNTER — Encounter: Payer: Self-pay | Admitting: Physician Assistant

## 2021-10-13 ENCOUNTER — Ambulatory Visit (INDEPENDENT_AMBULATORY_CARE_PROVIDER_SITE_OTHER): Payer: Medicare Other | Admitting: Physician Assistant

## 2021-10-13 VITALS — BP 111/72 | HR 99 | Ht 60.5 in | Wt 121.0 lb

## 2021-10-13 DIAGNOSIS — M5136 Other intervertebral disc degeneration, lumbar region: Secondary | ICD-10-CM

## 2021-10-13 DIAGNOSIS — M0579 Rheumatoid arthritis with rheumatoid factor of multiple sites without organ or systems involvement: Secondary | ICD-10-CM

## 2021-10-13 DIAGNOSIS — Z8669 Personal history of other diseases of the nervous system and sense organs: Secondary | ICD-10-CM

## 2021-10-13 DIAGNOSIS — Z8719 Personal history of other diseases of the digestive system: Secondary | ICD-10-CM

## 2021-10-13 DIAGNOSIS — Z8639 Personal history of other endocrine, nutritional and metabolic disease: Secondary | ICD-10-CM

## 2021-10-13 DIAGNOSIS — Z96653 Presence of artificial knee joint, bilateral: Secondary | ICD-10-CM | POA: Diagnosis not present

## 2021-10-13 DIAGNOSIS — Z87898 Personal history of other specified conditions: Secondary | ICD-10-CM | POA: Diagnosis not present

## 2021-10-13 DIAGNOSIS — Z79899 Other long term (current) drug therapy: Secondary | ICD-10-CM | POA: Diagnosis not present

## 2021-10-13 DIAGNOSIS — Z8709 Personal history of other diseases of the respiratory system: Secondary | ICD-10-CM

## 2021-10-13 DIAGNOSIS — Z8679 Personal history of other diseases of the circulatory system: Secondary | ICD-10-CM

## 2021-10-13 DIAGNOSIS — I10 Essential (primary) hypertension: Secondary | ICD-10-CM

## 2021-10-13 DIAGNOSIS — M810 Age-related osteoporosis without current pathological fracture: Secondary | ICD-10-CM

## 2021-10-13 MED ORDER — PREDNISONE 5 MG PO TABS: ORAL_TABLET | ORAL | 0 refills | Status: DC

## 2021-10-13 NOTE — Patient Instructions (Signed)
Take prednisone 10 mg daily tapering by 2.5 mg every week. ?Instructions:  ?Take prednisone 2 tablets by mouth daily x1 week, 1-1/2 tablets daily x1 week, 1 tablet daily x1 week, and then a half a tablet daily x1 week. ? ? ? ?Standing Labs ?We placed an order today for your standing lab work.  ? ?Please have your standing labs drawn in June and every 3 months  ? ?If possible, please have your labs drawn 2 weeks prior to your appointment so that the provider can discuss your results at your appointment. ? ?Please note that you may see your imaging and lab results in [facility] before we have reviewed them. ?We may be awaiting multiple results to interpret others before contacting you. ?Please allow our office up to 72 hours to thoroughly review all of the results before contacting the office for clarification of your results. ? ?We have open lab daily: ?Monday through Thursday from 1:30-4:30 PM and Friday from 1:30-4:00 PM ?at the office of Dr. Bo Merino, [facility].   ?Please be advised, all patients with office appointments requiring lab work will take precedent over walk-in lab work.  ?If possible, please come for your lab work on Monday and Friday afternoons, as you may experience shorter wait times. ?The office is located at [address], [place], [place], [place] [address] ?No appointment is necessary.   ?Labs are drawn by [facility]. Please bring your co-pay at the time of your lab draw.  You may receive a bill from [facility] for your lab work. ? ?Please note if you are on Hydroxychloroquine and and an order has been placed for a Hydroxychloroquine level, you will need to have it drawn 4 hours or more after your last dose. ? ?If you wish to have your labs drawn at another location, please call the office 24 hours in advance to send orders. ? ?If you have any questions regarding directions or hours of operation,  ?please call [phone].   ?As a reminder, please drink plenty of water prior to  coming for your lab work. Thanks! ? ?

## 2021-10-14 LAB — CBC WITH DIFFERENTIAL/PLATELET
Absolute Monocytes: 634 cells/uL (ref 200–950)
Basophils Absolute: 38 cells/uL (ref 0–200)
Basophils Relative: 0.6 %
Eosinophils Absolute: 51 cells/uL (ref 15–500)
Eosinophils Relative: 0.8 %
HCT: 37.5 % (ref 35.0–45.0)
Hemoglobin: 11.9 g/dL (ref 11.7–15.5)
Lymphs Abs: 2003 cells/uL (ref 850–3900)
MCH: 23.8 pg — ABNORMAL LOW (ref 27.0–33.0)
MCHC: 31.7 g/dL — ABNORMAL LOW (ref 32.0–36.0)
MCV: 74.9 fL — ABNORMAL LOW (ref 80.0–100.0)
MPV: 11.8 fL (ref 7.5–12.5)
Monocytes Relative: 9.9 %
Neutro Abs: 3674 cells/uL (ref 1500–7800)
Neutrophils Relative %: 57.4 %
Platelets: 283 10*3/uL (ref 140–400)
RBC: 5.01 10*6/uL (ref 3.80–5.10)
RDW: 14.5 % (ref 11.0–15.0)
Total Lymphocyte: 31.3 %
WBC: 6.4 10*3/uL (ref 3.8–10.8)

## 2021-10-14 LAB — COMPLETE METABOLIC PANEL WITH GFR
AG Ratio: 1.2 (calc) (ref 1.0–2.5)
ALT: 7 U/L (ref 6–29)
AST: 12 U/L (ref 10–35)
Albumin: 3.8 g/dL (ref 3.6–5.1)
Alkaline phosphatase (APISO): 53 U/L (ref 37–153)
BUN: 13 mg/dL (ref 7–25)
CO2: 25 mmol/L (ref 20–32)
Calcium: 9.4 mg/dL (ref 8.6–10.4)
Chloride: 106 mmol/L (ref 98–110)
Creat: 0.72 mg/dL (ref 0.60–0.95)
Globulin: 3.2 g/dL (calc) (ref 1.9–3.7)
Glucose, Bld: 90 mg/dL (ref 65–99)
Potassium: 4 mmol/L (ref 3.5–5.3)
Sodium: 142 mmol/L (ref 135–146)
Total Bilirubin: 0.3 mg/dL (ref 0.2–1.2)
Total Protein: 7 g/dL (ref 6.1–8.1)
eGFR: 82 mL/min/{1.73_m2} (ref >=60)

## 2021-10-14 NOTE — Progress Notes (Signed)
CMP WNL. CBC stable.

## 2021-10-21 ENCOUNTER — Other Ambulatory Visit: Payer: Self-pay | Admitting: Physician Assistant

## 2021-10-21 NOTE — Telephone Encounter (Signed)
Please schedule patient for a follow up visit. Patient due June 2023. Thanks! 

## 2021-10-21 NOTE — Telephone Encounter (Signed)
Next Visit: Due June 2023. Message sent to the front to schedule patient ? ?Last Visit: 10/13/2021 ? ?Last Fill: 08/09/2021 ? ?UJ:[id number]:Rheumatoid arthritis involving multiple sites with positive rheumatoid factor  ? ?Current Dose per office note 10/13/2021: Enbrel 50 mg sq injections once weekly ? ?Labs: 10/13/2021 CMP WNL.  CBC stable.  ? ?TB Gold: 07/15/2021 Neg   ? ?Okay to refill Enbrel?  ?

## 2021-10-22 NOTE — Telephone Encounter (Signed)
LMOM for patient to call and schedule 3 month follow-up appointment due in June 2023. ?

## 2021-11-18 ENCOUNTER — Other Ambulatory Visit: Payer: Self-pay | Admitting: Nurse Practitioner

## 2021-11-18 DIAGNOSIS — I10 Essential (primary) hypertension: Secondary | ICD-10-CM

## 2021-12-09 ENCOUNTER — Encounter: Payer: Self-pay | Admitting: Nurse Practitioner

## 2021-12-09 ENCOUNTER — Ambulatory Visit (INDEPENDENT_AMBULATORY_CARE_PROVIDER_SITE_OTHER): Payer: Medicare Other | Admitting: Nurse Practitioner

## 2021-12-09 VITALS — BP 120/62 | HR 96 | Temp 98.2°F | Ht 60.5 in | Wt 123.0 lb

## 2021-12-09 DIAGNOSIS — M0579 Rheumatoid arthritis with rheumatoid factor of multiple sites without organ or systems involvement: Secondary | ICD-10-CM | POA: Diagnosis not present

## 2021-12-09 DIAGNOSIS — F330 Major depressive disorder, recurrent, mild: Secondary | ICD-10-CM

## 2021-12-09 DIAGNOSIS — I10 Essential (primary) hypertension: Secondary | ICD-10-CM | POA: Diagnosis not present

## 2021-12-09 DIAGNOSIS — M25571 Pain in right ankle and joints of right foot: Secondary | ICD-10-CM | POA: Diagnosis not present

## 2021-12-09 DIAGNOSIS — E039 Hypothyroidism, unspecified: Secondary | ICD-10-CM

## 2021-12-09 MED ORDER — ESCITALOPRAM OXALATE 10 MG PO TABS: 10.0000 mg | ORAL_TABLET | Freq: Every day | ORAL | 2 refills | Status: DC

## 2021-12-09 NOTE — Progress Notes (Signed)
I,Tianna Badgett,acting as a Neurosurgeonscribe for SUPERVALU INCJanece Lakhia Gengler, FNP.,have documented all relevant documentation on the behalf of Arnette FeltsJanece Keshan Reha, FNP,as directed by  Arnette FeltsJanece Lucifer Soja, FNP while in the presence of Arnette FeltsJanece Jourdyn Ferrin, FNP.  This visit occurred during the SARS-CoV-2 public health emergency.  Safety protocols were in place, including screening questions prior to the visit, additional usage of staff PPE, and extensive cleaning of exam room while observing appropriate contact time as indicated for disinfecting solutions.  Subjective:     Patient ID: Jordan [name] , female    DOB: 02/18/1934 , 88 y.o.   MRN: [id number]   Chief Complaint  Patient presents with   Hypertension    HPI   Patient is here today for a follow up for htn and vit d. She is here today with her daughter, [name].   Hypertension This is a chronic problem. The current episode started more than 1 year ago. The problem is unchanged. The problem is controlled. Pertinent negatives include no anxiety, chest pain, headaches, palpitations or shortness of breath. There are no associated agents to hypertension. Risk factors for coronary artery disease include sedentary lifestyle. Past treatments include calcium channel blockers and diuretics. There are no compliance problems.  There is no history of angina. There is no history of chronic renal disease.    Past Medical History:  Diagnosis Date   Allergy    Anxiety    per pt's daughter   Arthritis    Asthma    pt reported   Depression    GERD (gastroesophageal reflux disease)    Hearing deficit L   High cholesterol    Hypertension    Osteoporosis    Rheumatoid arthritis (HCC)    Stroke [facility](HCC)      History reviewed. No pertinent family history.   Current Outpatient Medications:    escitalopram (LEXAPRO) 10 MG tablet, Take 1 tablet (10 mg total) by mouth daily., Disp: 30 tablet, Rfl: 2   albuterol (VENTOLIN HFA) 108 (90 Base) MCG/ACT inhaler, Inhale 1 puff into the lungs every 6  (six) hours as needed for wheezing or shortness of breath., Disp: 6.7 g, Rfl: 5   ALLERGY RELIEF 10 MG tablet, TAKE 1 TABLET BY MOUTH EVERY DAY, Disp: 30 tablet, Rfl: 1   amLODipine (NORVASC) 5 MG tablet, TAKE 1 TABLET BY MOUTH EVERY DAY, Disp: 30 tablet, Rfl: 1   aspirin EC 81 MG tablet, Take 81 mg by mouth daily. Swallow whole., Disp: , Rfl:    atropine 1 % ophthalmic solution, Place 1 drop into the left eye daily., Disp: , Rfl:    ENBREL SURECLICK 50 MG/ML injection, INJECT 0.4798mls into THE SKIN ONCE WEEKLY, Disp: 12 mL, Rfl: 0   feeding supplement, ENSURE ENLIVE, (ENSURE ENLIVE) LIQD, Take 237 mLs by mouth daily in the afternoon., Disp: , Rfl:    ferrous sulfate 325 (65 FE) MG tablet, Take 325 mg by mouth daily with breakfast., Disp: , Rfl:    fluticasone (FLONASE) 50 MCG/ACT nasal spray, INSTILL 2 SPRAYS IN EACH NOSTRIL EVERY DAY (Patient taking differently: Place 2 sprays into both nostrils daily as needed for allergies.), Disp: 16 g, Rfl: 1   folic acid (FOLVITE) 1 MG tablet, TAKE 1 TABLET(1 MG) BY MOUTH DAILY (Patient taking differently: Take 1 mg by mouth daily.), Disp: 90 tablet, Rfl: 0   latanoprost (XALATAN) 0.005 % ophthalmic solution, Place 1 drop into both eyes at bedtime. , Disp: , Rfl:    NAMZARIC 28-10 MG CP24, TAKE 1 CAPSULE BY  MOUTH EVERY DAY, Disp: 90 capsule, Rfl: 1   omeprazole (PRILOSEC) 20 MG capsule, TAKE 1 CAPSULE BY MOUTH EVERY DAY, Disp: 90 capsule, Rfl: 1   predniSONE (DELTASONE) 5 MG tablet, Take 2 tablets by mouth daily x1 wk, 1.5 tablets daily x1 wk, 1 tablet daily x1wk, half tablet daily x1wk., Disp: 35 tablet, Rfl: 0   QUEtiapine (SEROQUEL) 100 MG tablet, TAKE 1 TABLET BY MOUTH AT BEDTIME, Disp: 90 tablet, Rfl: 1   simvastatin (ZOCOR) 20 MG tablet, TAKE 1 TABLET BY MOUTH EVERY EVENING, Disp: 90 tablet, Rfl: 1   SYNTHROID 75 MCG tablet, TAKE 1 TABLET BY MOUTH EVERY DAY BEFORE breakfast, Disp: 90 tablet, Rfl: 1   timolol (TIMOPTIC) 0.5 % ophthalmic solution, 1 drop 2  (two) times daily., Disp: , Rfl:    Vitamin D, Ergocalciferol, (DRISDOL) 1.25 MG (50000 UNIT) CAPS capsule, TAKE 1 CAPSULE BY MOUTH TWICE A WEEK, Disp: 24 capsule, Rfl: 1   Allergies  Allergen Reactions   Penicillins Hives     Review of Systems  Constitutional: Negative.   HENT:         Legally blind  Respiratory: Negative.  Negative for shortness of breath.   Cardiovascular: Negative.  Negative for chest pain and palpitations.  Gastrointestinal: Negative.   Neurological: Negative.  Negative for headaches.  Psychiatric/Behavioral: Negative.      Today's Vitals   12/09/21 0938  BP: 120/62  Pulse: 96  Temp: 98.2 F (36.8 C)  TempSrc: Oral  Weight: 123 lb (55.8 kg)  Height: 5' 0.5" (1.537 m)   Body mass index is 23.63 kg/m.  Wt Readings from Last 3 Encounters:  12/09/21 123 lb (55.8 kg)  10/13/21 121 lb (54.9 kg)  07/15/21 121 lb (54.9 kg)    Objective:  Physical Exam Constitutional:      General: She is not in acute distress.    Appearance: Normal appearance.  Cardiovascular:     Rate and Rhythm: Normal rate and regular rhythm.     Pulses: Normal pulses.     Heart sounds: Normal heart sounds. No murmur heard. Pulmonary:     Effort: Pulmonary effort is normal. No respiratory distress.     Breath sounds: Normal breath sounds. No wheezing.  Skin:    General: Skin is warm and dry.     Coloration: Skin is not jaundiced.  Neurological:     General: No focal deficit present.     Mental Status: She is alert and oriented to person, place, and time.     Cranial Nerves: No cranial nerve deficit.  Psychiatric:        Mood and Affect: Mood normal.        Behavior: Behavior normal.        Thought Content: Thought content normal.        Judgment: Judgment normal.        Assessment And Plan:     1. Essential hypertension Comments: Blood pressure is well controlled, continue current medications.   2. Acquired hypothyroidism Comments: She has not been seen in the last  6-8 months.   3. Rheumatoid arthritis involving multiple sites with positive rheumatoid factor (HCC) Comments: Continue follow up with Rheumatology  4. Acute right ankle pain Comments: May be related to her arthritis she has trace edema.  - Uric acid  5. Mild episode of recurrent major depressive disorder (HCC) Comments: Depression screen is 9, will start her on Lexapro and follow up in 4 weeks can be virtual.  -  escitalopram (LEXAPRO) 10 MG tablet; Take 1 tablet (10 mg total) by mouth daily.  Dispense: 30 tablet; Refill: 2     Patient was given opportunity to ask questions. Patient verbalized understanding of the plan and was able to repeat key elements of the plan. All questions were answered to their satisfaction.  Arnette Felts, FNP   I, Arnette Felts, FNP, have reviewed all documentation for this visit. The documentation on 12/09/21 for the exam, diagnosis, procedures, and orders are all accurate and complete.   IF YOU HAVE BEEN REFERRED TO A SPECIALIST, IT MAY TAKE 1-2 WEEKS TO SCHEDULE/PROCESS THE REFERRAL. IF YOU HAVE NOT HEARD FROM US/SPECIALIST IN TWO WEEKS, PLEASE GIVE [place] A CALL AT [phone].   THE PATIENT IS ENCOURAGED TO PRACTICE SOCIAL DISTANCING DUE TO THE COVID-19 PANDEMIC.

## 2021-12-09 NOTE — Patient Instructions (Signed)
Hypertension, Adult High blood pressure (hypertension) is when the force of blood pumping through the arteries is too strong. The arteries are the blood vessels that carry blood from the heart throughout the body. Hypertension forces the heart to work harder to pump blood and may cause arteries to become narrow or stiff. Untreated or uncontrolled hypertension can lead to a heart attack, heart failure, a stroke, kidney disease, and other problems. A blood pressure reading consists of a higher number over a lower number. Ideally, your blood pressure should be below 120/80. The first ("top") number is called the systolic pressure. It is a measure of the pressure in your arteries as your heart beats. The second ("bottom") number is called the diastolic pressure. It is a measure of the pressure in your arteries as the heart relaxes. What are the causes? The exact cause of this condition is not known. There are some conditions that result in high blood pressure. What increases the risk? Certain factors may make you more likely to develop high blood pressure. Some of these risk factors are under your control, including: Smoking. Not getting enough exercise or physical activity. Being overweight. Having too much fat, sugar, calories, or salt (sodium) in your diet. Drinking too much alcohol. Other risk factors include: Having a personal history of heart disease, diabetes, high cholesterol, or kidney disease. Stress. Having a family history of high blood pressure and high cholesterol. Having obstructive sleep apnea. Age. The risk increases with age. What are the signs or symptoms? High blood pressure may not cause symptoms. Very high blood pressure (hypertensive crisis) may cause: Headache. Fast or irregular heartbeats (palpitations). Shortness of breath. Nosebleed. Nausea and vomiting. Vision changes. Severe chest pain, dizziness, and seizures. How is this diagnosed? This condition is diagnosed by  measuring your blood pressure while you are seated, with your arm resting on a flat surface, your legs uncrossed, and your feet flat on the floor. The cuff of the blood pressure monitor will be placed directly against the skin of your upper arm at the level of your heart. Blood pressure should be measured at least twice using the same arm. Certain conditions can cause a difference in blood pressure between your right and left arms. If you have a high blood pressure reading during one visit or you have normal blood pressure with other risk factors, you may be asked to: Return on a different day to have your blood pressure checked again. Monitor your blood pressure at home for 1 week or longer. If you are diagnosed with hypertension, you may have other blood or imaging tests to help your health care provider understand your overall risk for other conditions. How is this treated? This condition is treated by making healthy lifestyle changes, such as eating healthy foods, exercising more, and reducing your alcohol intake. You may be referred for counseling on a healthy diet and physical activity. Your health care provider may prescribe medicine if lifestyle changes are not enough to get your blood pressure under control and if: Your systolic blood pressure is above 130. Your diastolic blood pressure is above 80. Your personal target blood pressure may vary depending on your medical conditions, your age, and other factors. Follow these instructions at home: Eating and drinking  Eat a diet that is high in fiber and potassium, and low in sodium, added sugar, and fat. An example of this eating plan is called the DASH diet. DASH stands for Dietary Approaches to Stop Hypertension. To eat this way: Eat   plenty of fresh fruits and vegetables. Try to fill one half of your plate at each meal with fruits and vegetables. Eat whole grains, such as whole-wheat pasta, brown rice, or whole-grain bread. Fill about one  fourth of your plate with whole grains. Eat or drink low-fat dairy products, such as skim milk or low-fat yogurt. Avoid fatty cuts of meat, processed or cured meats, and poultry with skin. Fill about one fourth of your plate with lean proteins, such as fish, chicken without skin, beans, eggs, or tofu. Avoid pre-made and processed foods. These tend to be higher in sodium, added sugar, and fat. Reduce your daily sodium intake. Many people with hypertension should eat less than 1,500 mg of sodium a day. Do not drink alcohol if: Your health care provider tells you not to drink. You are pregnant, may be pregnant, or are planning to become pregnant. If you drink alcohol: Limit how much you have to: 0-1 drink a day for women. 0-2 drinks a day for men. Know how much alcohol is in your drink. In the [place] one drink equals one 12 oz bottle of beer (355 mL), one 5 oz glass of wine (148 mL), or one 1 oz glass of hard liquor (44 mL). Lifestyle  Work with your health care provider to maintain a healthy body weight or to lose weight. Ask what an ideal weight is for you. Get at least 30 minutes of exercise that causes your heart to beat faster (aerobic exercise) most days of the week. Activities may include walking, swimming, or biking. Include exercise to strengthen your muscles (resistance exercise), such as Pilates or lifting weights, as part of your weekly exercise routine. Try to do these types of exercises for 30 minutes at least 3 days a week. Do not use any products that contain nicotine or tobacco. These products include cigarettes, chewing tobacco, and vaping devices, such as e-cigarettes. If you need help quitting, ask your health care provider. Monitor your blood pressure at home as told by your health care provider. Keep all follow-up visits. This is important. Medicines Take over-the-counter and prescription medicines only as told by your health care provider. Follow directions carefully. Blood  pressure medicines must be taken as prescribed. Do not skip doses of blood pressure medicine. Doing this puts you at risk for problems and can make the medicine less effective. Ask your health care provider about side effects or reactions to medicines that you should watch for. Contact a health care provider if you: Think you are having a reaction to a medicine you are taking. Have headaches that keep coming back (recurring). Feel dizzy. Have swelling in your ankles. Have trouble with your vision. Get help right away if you: Develop a severe headache or confusion. Have unusual weakness or numbness. Feel faint. Have severe pain in your chest or abdomen. Vomit repeatedly. Have trouble breathing. These symptoms may be an emergency. Get help right away. Call 911. Do not wait to see if the symptoms will go away. Do not drive yourself to the hospital. Summary Hypertension is when the force of blood pumping through your arteries is too strong. If this condition is not controlled, it may put you at risk for serious complications. Your personal target blood pressure may vary depending on your medical conditions, your age, and other factors. For most people, a normal blood pressure is less than 120/80. Hypertension is treated with lifestyle changes, medicines, or a combination of both. Lifestyle changes include losing weight, eating a healthy,   low-sodium diet, exercising more, and limiting alcohol. This information is not intended to replace advice given to you by your health care provider. Make sure you discuss any questions you have with your health care provider. Document Revised: 05/18/2021 Document Reviewed: 05/18/2021 Elsevier Patient Education  2023 Elsevier Inc.  

## 2021-12-10 LAB — URIC ACID: Uric Acid: 3.8 mg/dL (ref 3.1–7.9)

## 2021-12-15 ENCOUNTER — Other Ambulatory Visit: Payer: Self-pay | Admitting: Nurse Practitioner

## 2021-12-15 DIAGNOSIS — E559 Vitamin D deficiency, unspecified: Secondary | ICD-10-CM

## 2022-01-11 ENCOUNTER — Ambulatory Visit: Payer: Medicare Other | Admitting: Nurse Practitioner

## 2022-01-13 ENCOUNTER — Other Ambulatory Visit: Payer: Self-pay | Admitting: Physician Assistant

## 2022-01-13 ENCOUNTER — Other Ambulatory Visit: Payer: Self-pay | Admitting: Nurse Practitioner

## 2022-01-13 DIAGNOSIS — Z79899 Other long term (current) drug therapy: Secondary | ICD-10-CM

## 2022-01-13 NOTE — Telephone Encounter (Signed)
Next Visit: 01/18/2022   Last Visit: 10/13/2021   Last Fill: 10/21/2021   [id number] arthritis involving multiple sites with positive rheumatoid factor    Current Dose per office note 10/13/2021: Enbrel 50 mg sq injections once weekly   Labs: 10/13/2021 CMP WNL.  CBC stable.    TB Gold: 07/15/2021 Neg    Patient's daughter advised patient is due for labs. She schedule an appointment for patient's follow up visit on 01/18/2022. Will update labs at that appointment.    Okay to refill Enbrel?

## 2022-01-13 NOTE — Progress Notes (Deleted)
Office Visit Note  Patient: Jordan [name]             Date of Birth: [birth year]           MRN: [id number]             PCP: Arnette Felts, FNP Referring: Arnette Felts, FNP Visit Date: 01/18/2022 Occupation: @GUAROCC @  Subjective:  No chief complaint on file.   History of Present Illness: Jordan [name] is a 88 y.o. female ***   Activities of Daily Living:  Patient reports morning stiffness for *** {minute/hour:19697}.   Patient {ACTIONS;DENIES/REPORTS:21021675::"Denies"} nocturnal pain.  Difficulty dressing/grooming: {ACTIONS;DENIES/REPORTS:21021675::"Denies"} Difficulty climbing stairs: {ACTIONS;DENIES/REPORTS:21021675::"Denies"} Difficulty getting out of chair: {ACTIONS;DENIES/REPORTS:21021675::"Denies"} Difficulty using hands for taps, buttons, cutlery, and/or writing: {ACTIONS;DENIES/REPORTS:21021675::"Denies"}  No Rheumatology ROS completed.   PMFS History:  Patient Active Problem List   Diagnosis Date Noted   Chest pain    Hypotension 03/24/2020   Alzheimer's disease (HCC) 08/13/2019   Essential hypertension 08/13/2018   Acquired hypothyroidism 08/13/2018   Rash and nonspecific skin eruption 08/13/2018   Memory loss 04/12/2017   Depression 04/12/2017   Rheumatoid arthritis involving multiple sites with positive rheumatoid factor (HCC) 08/17/2016   High risk medication use 08/17/2016   H/O total knee replacement, bilateral 08/17/2016   Spondylosis of lumbar region without myelopathy or radiculopathy 08/17/2016   History of glaucoma/ patient is legally blind 08/17/2016   History of gastroesophageal reflux (GERD) 08/17/2016   History of diverticulosis 08/17/2016   History of hypothyroidism 08/17/2016   history of asthmatic bronchitis 08/17/2016   History of memory loss 08/17/2016   History of atherosclerosis 08/17/2016    Past Medical History:  Diagnosis Date   Allergy    Anxiety    per pt's daughter   Arthritis    Asthma    pt reported   Depression     GERD (gastroesophageal reflux disease)    Hearing deficit L   High cholesterol    Hypertension    Osteoporosis    Rheumatoid arthritis (HCC)    Stroke (HCC)     No family history on file. Past Surgical History:  Procedure Laterality Date   ABDOMINAL HYSTERECTOMY     KNEE ARTHROPLASTY     REPLACEMENT TOTAL KNEE BILATERAL Bilateral    WRIST SURGERY Left    Social History   Social History Narrative   Lives at home with grandson.   Right-handed.   2-3 cups coffee per day.   Immunization History  Administered Date(s) Administered   Fluad Quad(high Dose 65+) 04/06/2020   Moderna SARS-COV2 Booster Vaccination 03/16/2021   Moderna Sars-Covid-2 Vaccination 09/26/2019, 10/24/2019     Objective: Vital Signs: There were no vitals taken for this visit.   Physical Exam   Musculoskeletal Exam: ***  CDAI Exam: CDAI Score: -- Patient Global: --; Provider Global: -- Swollen: --; Tender: -- Joint Exam 01/18/2022   No joint exam has been documented for this visit   There is currently no information documented on the homunculus. Go to the Rheumatology activity and complete the homunculus joint exam.  Investigation: No additional findings.  Imaging: No results found.  Recent Labs: Lab Results  Component Value Date   WBC 6.4 10/13/2021   HGB 11.9 10/13/2021   PLT 283 10/13/2021   NA 142 10/13/2021   K 4.0 10/13/2021   CL 106 10/13/2021   CO2 25 10/13/2021   GLUCOSE 90 10/13/2021   BUN 13 10/13/2021   CREATININE 0.72 10/13/2021   BILITOT 0.3 10/13/2021  ALKPHOS 63 09/28/2020   AST 12 10/13/2021   ALT 7 10/13/2021   PROT 7.0 10/13/2021   ALBUMIN 4.2 09/28/2020   CALCIUM 9.4 10/13/2021   GFRAA 89 06/11/2020   QFTBGOLDPLUS NEGATIVE 07/15/2021    Speciality Comments: No specialty comments available.  Procedures:  No procedures performed Allergies: Penicillins   Assessment / Plan:     Visit Diagnoses: No diagnosis found.  Orders: No orders of the defined  types were placed in this encounter.  No orders of the defined types were placed in this encounter.   Face-to-face time spent with patient was *** minutes. Greater than 50% of time was spent in counseling and coordination of care.  Follow-Up Instructions: No follow-ups on file.   Pollyann Savoy, MD  Note - This record has been created using Animal nutritionist.  Chart creation errors have been sought, but may not always  have been located. Such creation errors do not reflect on  the standard of medical care.

## 2022-01-17 ENCOUNTER — Ambulatory Visit: Payer: Medicare Other | Admitting: Nurse Practitioner

## 2022-01-18 ENCOUNTER — Ambulatory Visit: Payer: Medicare Other | Admitting: Rheumatology

## 2022-01-18 DIAGNOSIS — Z8709 Personal history of other diseases of the respiratory system: Secondary | ICD-10-CM

## 2022-01-18 DIAGNOSIS — M810 Age-related osteoporosis without current pathological fracture: Secondary | ICD-10-CM

## 2022-01-18 DIAGNOSIS — Z87898 Personal history of other specified conditions: Secondary | ICD-10-CM

## 2022-01-18 DIAGNOSIS — Z79899 Other long term (current) drug therapy: Secondary | ICD-10-CM

## 2022-01-18 DIAGNOSIS — Z8679 Personal history of other diseases of the circulatory system: Secondary | ICD-10-CM

## 2022-01-18 DIAGNOSIS — I10 Essential (primary) hypertension: Secondary | ICD-10-CM

## 2022-01-18 DIAGNOSIS — Z8669 Personal history of other diseases of the nervous system and sense organs: Secondary | ICD-10-CM

## 2022-01-18 DIAGNOSIS — Z8639 Personal history of other endocrine, nutritional and metabolic disease: Secondary | ICD-10-CM

## 2022-01-18 DIAGNOSIS — M0579 Rheumatoid arthritis with rheumatoid factor of multiple sites without organ or systems involvement: Secondary | ICD-10-CM

## 2022-01-18 DIAGNOSIS — M5136 Other intervertebral disc degeneration, lumbar region: Secondary | ICD-10-CM

## 2022-01-18 DIAGNOSIS — Z96653 Presence of artificial knee joint, bilateral: Secondary | ICD-10-CM

## 2022-01-18 DIAGNOSIS — Z8719 Personal history of other diseases of the digestive system: Secondary | ICD-10-CM

## 2022-01-20 ENCOUNTER — Ambulatory Visit: Payer: Medicare Other | Admitting: Nurse Practitioner

## 2022-01-26 ENCOUNTER — Ambulatory Visit (INDEPENDENT_AMBULATORY_CARE_PROVIDER_SITE_OTHER): Payer: Medicare Other | Admitting: Nurse Practitioner

## 2022-01-26 ENCOUNTER — Other Ambulatory Visit: Payer: Self-pay | Admitting: Nurse Practitioner

## 2022-01-26 ENCOUNTER — Encounter: Payer: Self-pay | Admitting: Nurse Practitioner

## 2022-01-26 DIAGNOSIS — F330 Major depressive disorder, recurrent, mild: Secondary | ICD-10-CM

## 2022-01-26 DIAGNOSIS — I10 Essential (primary) hypertension: Secondary | ICD-10-CM

## 2022-01-26 DIAGNOSIS — E039 Hypothyroidism, unspecified: Secondary | ICD-10-CM

## 2022-01-26 MED ORDER — ESCITALOPRAM OXALATE 10 MG PO TABS: 10.0000 mg | ORAL_TABLET | Freq: Every day | ORAL | 1 refills | Status: AC

## 2022-01-26 NOTE — Patient Instructions (Signed)

## 2022-01-26 NOTE — Progress Notes (Signed)
Virtual Visit via Telephone failed mychart video visit unable to see that patient was connected on provider side   This visit type was conducted due to national recommendations for restrictions regarding the COVID-19 Pandemic (e.g. social distancing) in an effort to limit this patient's exposure and mitigate transmission in our community.  Due to her co-morbid illnesses, this patient is at least at moderate risk for complications without adequate follow up.  This format is felt to be most appropriate for this patient at this time.  All issues noted in this document were discussed and addressed.  A limited physical exam was performed with this format.    This visit type was conducted due to national recommendations for restrictions regarding the COVID-19 Pandemic (e.g. social distancing) in an effort to limit this patient's exposure and mitigate transmission in our community.  Patients identity confirmed using two different identifiers.  This format is felt to be most appropriate for this patient at this time.  All issues noted in this document were discussed and addressed.  No physical exam was performed (except for noted visual exam findings with Video Visits).    Date:  02/20/2022   ID:  Jordan [name], DOB 07/08/1934, MRN [id number]  Patient Location:  Home - spoke with Lesia Hausen and Regan Rakers and Lucas Mallow  Provider location:   Office    Chief Complaint:  medication f/u   History of Present Illness:    Jordan [name] is a 88 y.o. female who presents via telephone conferencing for a telehealth visit today.    The patient does have symptoms concerning for COVID-19 infection (fever, chills, cough, or new shortness of breath).   F/u for new medication Lexapro for depression. She feels like her mood is improved. No abnormal side effects from lexapro     Past Medical History:  Diagnosis Date   Allergy    Anxiety    per pt's daughter   Arthritis    Asthma    pt  reported   Depression    GERD (gastroesophageal reflux disease)    Hearing deficit L   High cholesterol    Hypertension    Osteoporosis    Rheumatoid arthritis (HCC)    Stroke [facility])    Past Surgical History:  Procedure Laterality Date   ABDOMINAL HYSTERECTOMY     KNEE ARTHROPLASTY     REPLACEMENT TOTAL KNEE BILATERAL Bilateral    WRIST SURGERY Left      No outpatient medications have been marked as taking for the 01/26/22 encounter (Office Visit) with Arnette Felts, FNP.     Allergies:   Penicillins   Social History   Tobacco Use   Smoking status: Former    Packs/day: 0.25    Years: 13.00    Total pack years: 3.25    Types: Cigarettes    Quit date: 08/18/2006    Years since quitting: 15.5    Passive exposure: Never   Smokeless tobacco: Never  Vaping Use   Vaping Use: Never used  Substance Use Topics   Alcohol use: No   Drug use: Not Currently    Types: Hydrocodone     Family Hx: The patient's family history is not on file.  ROS:   Please see the history of present illness.    Review of Systems  Constitutional: Negative.   Cardiovascular: Negative.   Genitourinary: Negative.   Musculoskeletal: Negative.   Neurological: Negative.   Psychiatric/Behavioral: Negative.      All other systems reviewed and  are negative.   Labs/Other Tests and Data Reviewed:    Recent Labs: 02/04/2022: ALT 4; BUN 15; Creat 0.97; Hemoglobin 12.1; Platelets 258; Potassium 4.6; Sodium 140   Recent Lipid Panel Lab Results  Component Value Date/Time   CHOL 156 03/26/2020 10:44 AM   TRIG 66 03/26/2020 10:44 AM   HDL 57 03/26/2020 10:44 AM   CHOLHDL 2.7 03/26/2020 10:44 AM   LDLCALC 86 03/26/2020 10:44 AM    Wt Readings from Last 3 Encounters:  12/09/21 123 lb (55.8 kg)  10/13/21 121 lb (54.9 kg)  07/15/21 121 lb (54.9 kg)     Exam:    Vital Signs:  There were no vitals taken for this visit.    Physical Exam Constitutional:      General: She is not in acute  distress.    Appearance: Normal appearance.  Pulmonary:     Effort: Pulmonary effort is normal. No respiratory distress.  Neurological:     General: No focal deficit present.     Mental Status: She is alert and oriented to person, place, and time. Mental status is at baseline.     Cranial Nerves: No cranial nerve deficit.  Psychiatric:        Mood and Affect: Mood and affect normal.        Behavior: Behavior normal.        Thought Content: Thought content normal.        Cognition and Memory: Memory normal.        Judgment: Judgment normal.     ASSESSMENT & PLAN:    1. Mild episode of recurrent major depressive disorder [facility]) Daughter feels like the medication is effective, continue at current dose - escitalopram (LEXAPRO) 10 MG tablet; Take 1 tablet (10 mg total) by mouth daily.  Dispense: 90 tablet; Refill: 1  2. Acquired hypothyroidism Needs to come in for repeat thyroid studies. She is advised to take her medications daily as directed.  - T3, free; Future - T4; Future - TSH; Future   COVID-19 Education: The signs and symptoms of COVID-19 were discussed with the patient and how to seek care for testing (follow up with PCP or arrange E-visit).  The importance of social distancing was discussed today.  Patient Risk:   After full review of this patients clinical status, I feel that they are at least moderate risk at this time.  Time:   Today, I have spent 9 minutes/ seconds with the patient with telehealth technology discussing above diagnoses.     Medication Adjustments/Labs and Tests Ordered: Current medicines are reviewed at length with the patient today.  Concerns regarding medicines are outlined above.   Tests Ordered: Orders Placed This Encounter  Procedures   T3, free   T4   TSH    Medication Changes: Meds ordered this encounter  Medications   escitalopram (LEXAPRO) 10 MG tablet    Sig: Take 1 tablet (10 mg total) by mouth daily.    Dispense:  90 tablet     Refill:  1    Disposition:  Follow up prn  Signed, Arnette Felts, FNP

## 2022-01-28 NOTE — Progress Notes (Deleted)
Office Visit Note  Patient: Jordan [name]             Date of Birth: [birth year]           MRN: [id number]             PCP: Arnette Felts, FNP Referring: Arnette Felts, FNP Visit Date: 02/09/2022 Occupation: @GUAROCC @  Subjective:  No chief complaint on file.   History of Present Illness: Jordan [name] is a 88 y.o. female ***   Activities of Daily Living:  Patient reports morning stiffness for *** {minute/hour:19697}.   Patient {ACTIONS;DENIES/REPORTS:21021675::"Denies"} nocturnal pain.  Difficulty dressing/grooming: {ACTIONS;DENIES/REPORTS:21021675::"Denies"} Difficulty climbing stairs: {ACTIONS;DENIES/REPORTS:21021675::"Denies"} Difficulty getting out of chair: {ACTIONS;DENIES/REPORTS:21021675::"Denies"} Difficulty using hands for taps, buttons, cutlery, and/or writing: {ACTIONS;DENIES/REPORTS:21021675::"Denies"}  No Rheumatology ROS completed.   PMFS History:  Patient Active Problem List   Diagnosis Date Noted  . Chest pain   . Hypotension 03/24/2020  . Alzheimer's disease (HCC) 08/13/2019  . Essential hypertension 08/13/2018  . Acquired hypothyroidism 08/13/2018  . Rash and nonspecific skin eruption 08/13/2018  . Memory loss 04/12/2017  . Depression 04/12/2017  . Rheumatoid arthritis involving multiple sites with positive rheumatoid factor (HCC) 08/17/2016  . High risk medication use 08/17/2016  . H/O total knee replacement, bilateral 08/17/2016  . Spondylosis of lumbar region without myelopathy or radiculopathy 08/17/2016  . History of glaucoma/ patient is legally blind 08/17/2016  . History of gastroesophageal reflux (GERD) 08/17/2016  . History of diverticulosis 08/17/2016  . History of hypothyroidism 08/17/2016  . history of asthmatic bronchitis 08/17/2016  . History of memory loss 08/17/2016  . History of atherosclerosis 08/17/2016    Past Medical History:  Diagnosis Date  . Allergy   . Anxiety    per pt's daughter  . Arthritis   . Asthma    pt  reported  . Depression   . GERD (gastroesophageal reflux disease)   . Hearing deficit L  . High cholesterol   . Hypertension   . Osteoporosis   . Rheumatoid arthritis (HCC)   . Stroke [facility])     No family history on file. Past Surgical History:  Procedure Laterality Date  . ABDOMINAL HYSTERECTOMY    . KNEE ARTHROPLASTY    . REPLACEMENT TOTAL KNEE BILATERAL Bilateral   . WRIST SURGERY Left    Social History   Social History Narrative   Lives at home with grandson.   Right-handed.   2-3 cups coffee per day.   Immunization History  Administered Date(s) Administered  . Fluad Quad(high Dose 65+) 04/06/2020  . Moderna SARS-COV2 Booster Vaccination 03/16/2021  . Moderna Sars-Covid-2 Vaccination 09/26/2019, 10/24/2019     Objective: Vital Signs: There were no vitals taken for this visit.   Physical Exam   Musculoskeletal Exam: ***  CDAI Exam: CDAI Score: -- Patient Global: --; Provider Global: -- Swollen: --; Tender: -- Joint Exam 02/09/2022   No joint exam has been documented for this visit   There is currently no information documented on the homunculus. Go to the Rheumatology activity and complete the homunculus joint exam.  Investigation: No additional findings.  Imaging: No results found.  Recent Labs: Lab Results  Component Value Date   WBC 6.4 10/13/2021   HGB 11.9 10/13/2021   PLT 283 10/13/2021   NA 142 10/13/2021   K 4.0 10/13/2021   CL 106 10/13/2021   CO2 25 10/13/2021   GLUCOSE 90 10/13/2021   BUN 13 10/13/2021   CREATININE 0.72 10/13/2021   BILITOT 0.3 10/13/2021  ALKPHOS 63 09/28/2020   AST 12 10/13/2021   ALT 7 10/13/2021   PROT 7.0 10/13/2021   ALBUMIN 4.2 09/28/2020   CALCIUM 9.4 10/13/2021   GFRAA 89 06/11/2020   QFTBGOLDPLUS NEGATIVE 07/15/2021    Speciality Comments: No specialty comments available.  Procedures:  No procedures performed Allergies: Penicillins   Assessment / Plan:     Visit Diagnoses: No diagnosis  found.  Orders: No orders of the defined types were placed in this encounter.  No orders of the defined types were placed in this encounter.   Face-to-face time spent with patient was *** minutes. Greater than 50% of time was spent in counseling and coordination of care.  Follow-Up Instructions: No follow-ups on file.   Ellen Henri, CMA  Note - This record has been created using Animal nutritionist.  Chart creation errors have been sought, but may not always  have been located. Such creation errors do not reflect on  the standard of medical care.

## 2022-02-03 ENCOUNTER — Telehealth: Payer: Self-pay | Admitting: Rheumatology

## 2022-02-03 DIAGNOSIS — Z79899 Other long term (current) drug therapy: Secondary | ICD-10-CM

## 2022-02-03 NOTE — Telephone Encounter (Signed)
Lab Orders released.  

## 2022-02-03 NOTE — Telephone Encounter (Signed)
Patients daughter [name] called the office requesting lab orders be sent to [facility] on [address] in [place]. Patient is going tomorrow.

## 2022-02-04 ENCOUNTER — Other Ambulatory Visit: Payer: Self-pay | Admitting: *Deleted

## 2022-02-04 DIAGNOSIS — Z79899 Other long term (current) drug therapy: Secondary | ICD-10-CM | POA: Diagnosis not present

## 2022-02-05 LAB — CBC WITH DIFFERENTIAL/PLATELET
Absolute Monocytes: 856 cells/uL (ref 200–950)
Basophils Absolute: 41 cells/uL (ref 0–200)
Basophils Relative: 0.6 %
Eosinophils Absolute: 62 cells/uL (ref 15–500)
Eosinophils Relative: 0.9 %
HCT: 38.3 % (ref 35.0–45.0)
Hemoglobin: 12.1 g/dL (ref 11.7–15.5)
Lymphs Abs: 2815 cells/uL (ref 850–3900)
MCH: 23.9 pg — ABNORMAL LOW (ref 27.0–33.0)
MCHC: 31.6 g/dL — ABNORMAL LOW (ref 32.0–36.0)
MCV: 75.7 fL — ABNORMAL LOW (ref 80.0–100.0)
MPV: 11.3 fL (ref 7.5–12.5)
Monocytes Relative: 12.4 %
Neutro Abs: 3126 cells/uL (ref 1500–7800)
Neutrophils Relative %: 45.3 %
Platelets: 258 10*3/uL (ref 140–400)
RBC: 5.06 10*6/uL (ref 3.80–5.10)
RDW: 15.5 % — ABNORMAL HIGH (ref 11.0–15.0)
Total Lymphocyte: 40.8 %
WBC: 6.9 10*3/uL (ref 3.8–10.8)

## 2022-02-05 LAB — COMPLETE METABOLIC PANEL WITH GFR
AG Ratio: 1.2 (calc) (ref 1.0–2.5)
ALT: 4 U/L — ABNORMAL LOW (ref 6–29)
AST: 10 U/L (ref 10–35)
Albumin: 4.1 g/dL (ref 3.6–5.1)
Alkaline phosphatase (APISO): 56 U/L (ref 37–153)
BUN/Creatinine Ratio: 15 (calc) (ref 6–22)
BUN: 15 mg/dL (ref 7–25)
CO2: 26 mmol/L (ref 20–32)
Calcium: 9.7 mg/dL (ref 8.6–10.4)
Chloride: 104 mmol/L (ref 98–110)
Creat: 0.97 mg/dL — ABNORMAL HIGH (ref 0.60–0.95)
Globulin: 3.3 g/dL (calc) (ref 1.9–3.7)
Glucose, Bld: 73 mg/dL (ref 65–99)
Potassium: 4.6 mmol/L (ref 3.5–5.3)
Sodium: 140 mmol/L (ref 135–146)
Total Bilirubin: 0.4 mg/dL (ref 0.2–1.2)
Total Protein: 7.4 g/dL (ref 6.1–8.1)
eGFR: 57 mL/min/{1.73_m2} — ABNORMAL LOW (ref >=60)

## 2022-02-07 NOTE — Progress Notes (Signed)
Creatinine is borderline elevated-0.97 and GFR is slightly low-57.  She should avoid the use of NSAIDs and remain hydrated.   Rest of CMP WNL.  CBC stable.

## 2022-02-09 ENCOUNTER — Ambulatory Visit: Payer: Medicare Other | Admitting: Rheumatology

## 2022-02-09 DIAGNOSIS — Z8709 Personal history of other diseases of the respiratory system: Secondary | ICD-10-CM

## 2022-02-09 DIAGNOSIS — Z79899 Other long term (current) drug therapy: Secondary | ICD-10-CM

## 2022-02-09 DIAGNOSIS — M5136 Other intervertebral disc degeneration, lumbar region: Secondary | ICD-10-CM

## 2022-02-09 DIAGNOSIS — Z8679 Personal history of other diseases of the circulatory system: Secondary | ICD-10-CM

## 2022-02-09 DIAGNOSIS — Z96653 Presence of artificial knee joint, bilateral: Secondary | ICD-10-CM

## 2022-02-09 DIAGNOSIS — Z87898 Personal history of other specified conditions: Secondary | ICD-10-CM

## 2022-02-09 DIAGNOSIS — M0579 Rheumatoid arthritis with rheumatoid factor of multiple sites without organ or systems involvement: Secondary | ICD-10-CM

## 2022-02-09 DIAGNOSIS — Z8719 Personal history of other diseases of the digestive system: Secondary | ICD-10-CM

## 2022-02-09 DIAGNOSIS — M810 Age-related osteoporosis without current pathological fracture: Secondary | ICD-10-CM

## 2022-02-09 DIAGNOSIS — Z8639 Personal history of other endocrine, nutritional and metabolic disease: Secondary | ICD-10-CM

## 2022-02-09 DIAGNOSIS — Z8669 Personal history of other diseases of the nervous system and sense organs: Secondary | ICD-10-CM

## 2022-02-09 DIAGNOSIS — I10 Essential (primary) hypertension: Secondary | ICD-10-CM

## 2022-02-14 NOTE — Progress Notes (Deleted)
Office Visit Note  Patient: Jordan [name]             Date of Birth: 1933/12/12           MRN: [id number]             PCP: Arnette Felts, FNP Referring: Arnette Felts, FNP Visit Date: 02/28/2022 Occupation: @GUAROCC @  Subjective:  No chief complaint on file.   History of Present Illness: Jordan [name] is a 88 y.o. female ***   Activities of Daily Living:  Patient reports morning stiffness for *** {minute/hour:19697}.   Patient {ACTIONS;DENIES/REPORTS:21021675::"Denies"} nocturnal pain.  Difficulty dressing/grooming: {ACTIONS;DENIES/REPORTS:21021675::"Denies"} Difficulty climbing stairs: {ACTIONS;DENIES/REPORTS:21021675::"Denies"} Difficulty getting out of chair: {ACTIONS;DENIES/REPORTS:21021675::"Denies"} Difficulty using hands for taps, buttons, cutlery, and/or writing: {ACTIONS;DENIES/REPORTS:21021675::"Denies"}  No Rheumatology ROS completed.   PMFS History:  Patient Active Problem List   Diagnosis Date Noted   Chest pain    Hypotension 03/24/2020   Alzheimer's disease (HCC) 08/13/2019   Essential hypertension 08/13/2018   Acquired hypothyroidism 08/13/2018   Rash and nonspecific skin eruption 08/13/2018   Memory loss 04/12/2017   Depression 04/12/2017   Rheumatoid arthritis involving multiple sites with positive rheumatoid factor (HCC) 08/17/2016   High risk medication use 08/17/2016   H/O total knee replacement, bilateral 08/17/2016   Spondylosis of lumbar region without myelopathy or radiculopathy 08/17/2016   History of glaucoma/ patient is legally blind 08/17/2016   History of gastroesophageal reflux (GERD) 08/17/2016   History of diverticulosis 08/17/2016   History of hypothyroidism 08/17/2016   history of asthmatic bronchitis 08/17/2016   History of memory loss 08/17/2016   History of atherosclerosis 08/17/2016    Past Medical History:  Diagnosis Date   Allergy    Anxiety    per pt's daughter   Arthritis    Asthma    pt reported   Depression     GERD (gastroesophageal reflux disease)    Hearing deficit L   High cholesterol    Hypertension    Osteoporosis    Rheumatoid arthritis (HCC)    Stroke (HCC)     No family history on file. Past Surgical History:  Procedure Laterality Date   ABDOMINAL HYSTERECTOMY     KNEE ARTHROPLASTY     REPLACEMENT TOTAL KNEE BILATERAL Bilateral    WRIST SURGERY Left    Social History   Social History Narrative   Lives at home with grandson.   Right-handed.   2-3 cups coffee per day.   Immunization History  Administered Date(s) Administered   Fluad Quad(high Dose 65+) 04/06/2020   Moderna SARS-COV2 Booster Vaccination 03/16/2021   Moderna Sars-Covid-2 Vaccination 09/26/2019, 10/24/2019     Objective: Vital Signs: There were no vitals taken for this visit.   Physical Exam   Musculoskeletal Exam: ***  CDAI Exam: CDAI Score: -- Patient Global: --; Provider Global: -- Swollen: --; Tender: -- Joint Exam 02/28/2022   No joint exam has been documented for this visit   There is currently no information documented on the homunculus. Go to the Rheumatology activity and complete the homunculus joint exam.  Investigation: No additional findings.  Imaging: No results found.  Recent Labs: Lab Results  Component Value Date   WBC 6.9 02/04/2022   HGB 12.1 02/04/2022   PLT 258 02/04/2022   NA 140 02/04/2022   K 4.6 02/04/2022   CL 104 02/04/2022   CO2 26 02/04/2022   GLUCOSE 73 02/04/2022   BUN 15 02/04/2022   CREATININE 0.97 (H) 02/04/2022   BILITOT 0.4 02/04/2022  ALKPHOS 63 09/28/2020   AST 10 02/04/2022   ALT 4 (L) 02/04/2022   PROT 7.4 02/04/2022   ALBUMIN 4.2 09/28/2020   CALCIUM 9.7 02/04/2022   GFRAA 89 06/11/2020   QFTBGOLDPLUS NEGATIVE 07/15/2021    Speciality Comments: No specialty comments available.  Procedures:  No procedures performed Allergies: Penicillins   Assessment / Plan:     Visit Diagnoses: No diagnosis found.  Orders: No orders of the  defined types were placed in this encounter.  No orders of the defined types were placed in this encounter.   Face-to-face time spent with patient was *** minutes. Greater than 50% of time was spent in counseling and coordination of care.  Follow-Up Instructions: No follow-ups on file.   Ellen Henri, CMA  Note - This record has been created using Animal nutritionist.  Chart creation errors have been sought, but may not always  have been located. Such creation errors do not reflect on  the standard of medical care.

## 2022-02-23 ENCOUNTER — Other Ambulatory Visit: Payer: Self-pay | Admitting: Nurse Practitioner

## 2022-02-23 ENCOUNTER — Other Ambulatory Visit: Payer: Self-pay | Admitting: Physician Assistant

## 2022-02-23 DIAGNOSIS — G4700 Insomnia, unspecified: Secondary | ICD-10-CM

## 2022-02-23 NOTE — Telephone Encounter (Signed)
Next Visit: 02/28/2022   Last Visit: 10/13/2021   Last Fill: 01/13/2022 (30 day supply)   EQ:[id number] arthritis involving multiple sites with positive rheumatoid factor    Current Dose per office note 10/13/2021: Enbrel 50 mg sq injections once weekly   Labs: 02/04/2022 Creatinine is borderline elevated-0.97 and GFR is slightly low-57.Rest of CMP WNL. CBC stable.    TB Gold: 07/15/2021 Neg    Okay to refill Enbrel?

## 2022-02-28 ENCOUNTER — Ambulatory Visit: Payer: Medicare Other | Admitting: Physician Assistant

## 2022-02-28 DIAGNOSIS — M5136 Other intervertebral disc degeneration, lumbar region: Secondary | ICD-10-CM

## 2022-02-28 DIAGNOSIS — Z79899 Other long term (current) drug therapy: Secondary | ICD-10-CM

## 2022-02-28 DIAGNOSIS — Z8679 Personal history of other diseases of the circulatory system: Secondary | ICD-10-CM

## 2022-02-28 DIAGNOSIS — Z8719 Personal history of other diseases of the digestive system: Secondary | ICD-10-CM

## 2022-02-28 DIAGNOSIS — M810 Age-related osteoporosis without current pathological fracture: Secondary | ICD-10-CM

## 2022-02-28 DIAGNOSIS — Z8639 Personal history of other endocrine, nutritional and metabolic disease: Secondary | ICD-10-CM

## 2022-02-28 DIAGNOSIS — Z87898 Personal history of other specified conditions: Secondary | ICD-10-CM

## 2022-02-28 DIAGNOSIS — Z8669 Personal history of other diseases of the nervous system and sense organs: Secondary | ICD-10-CM

## 2022-02-28 DIAGNOSIS — I10 Essential (primary) hypertension: Secondary | ICD-10-CM

## 2022-02-28 DIAGNOSIS — Z96653 Presence of artificial knee joint, bilateral: Secondary | ICD-10-CM

## 2022-02-28 DIAGNOSIS — Z8709 Personal history of other diseases of the respiratory system: Secondary | ICD-10-CM

## 2022-02-28 DIAGNOSIS — M0579 Rheumatoid arthritis with rheumatoid factor of multiple sites without organ or systems involvement: Secondary | ICD-10-CM

## 2022-02-28 NOTE — Progress Notes (Deleted)
Office Visit Note  Patient: Jordan [name]             Date of Birth: 01-Mar-1934           MRN: [id number]             PCP: Arnette Felts, FNP Referring: Arnette Felts, FNP Visit Date: 03/14/2022 Occupation: @GUAROCC @  Subjective:  No chief complaint on file.   History of Present Illness: Jordan [name] is a 88 y.o. female ***   Activities of Daily Living:  Patient reports morning stiffness for *** {minute/hour:19697}.   Patient {ACTIONS;DENIES/REPORTS:21021675::"Denies"} nocturnal pain.  Difficulty dressing/grooming: {ACTIONS;DENIES/REPORTS:21021675::"Denies"} Difficulty climbing stairs: {ACTIONS;DENIES/REPORTS:21021675::"Denies"} Difficulty getting out of chair: {ACTIONS;DENIES/REPORTS:21021675::"Denies"} Difficulty using hands for taps, buttons, cutlery, and/or writing: {ACTIONS;DENIES/REPORTS:21021675::"Denies"}  No Rheumatology ROS completed.   PMFS History:  Patient Active Problem List   Diagnosis Date Noted  . Chest pain   . Hypotension 03/24/2020  . Alzheimer's disease (HCC) 08/13/2019  . Essential hypertension 08/13/2018  . Acquired hypothyroidism 08/13/2018  . Rash and nonspecific skin eruption 08/13/2018  . Memory loss 04/12/2017  . Depression 04/12/2017  . Rheumatoid arthritis involving multiple sites with positive rheumatoid factor (HCC) 08/17/2016  . High risk medication use 08/17/2016  . H/O total knee replacement, bilateral 08/17/2016  . Spondylosis of lumbar region without myelopathy or radiculopathy 08/17/2016  . History of glaucoma/ patient is legally blind 08/17/2016  . History of gastroesophageal reflux (GERD) 08/17/2016  . History of diverticulosis 08/17/2016  . History of hypothyroidism 08/17/2016  . history of asthmatic bronchitis 08/17/2016  . History of memory loss 08/17/2016  . History of atherosclerosis 08/17/2016    Past Medical History:  Diagnosis Date  . Allergy   . Anxiety    per pt's daughter  . Arthritis   . Asthma    pt  reported  . Depression   . GERD (gastroesophageal reflux disease)   . Hearing deficit L  . High cholesterol   . Hypertension   . Osteoporosis   . Rheumatoid arthritis (HCC)   . Stroke [facility])     No family history on file. Past Surgical History:  Procedure Laterality Date  . ABDOMINAL HYSTERECTOMY    . KNEE ARTHROPLASTY    . REPLACEMENT TOTAL KNEE BILATERAL Bilateral   . WRIST SURGERY Left    Social History   Social History Narrative   Lives at home with grandson.   Right-handed.   2-3 cups coffee per day.   Immunization History  Administered Date(s) Administered  . Fluad Quad(high Dose 65+) 04/06/2020  . Moderna SARS-COV2 Booster Vaccination 03/16/2021  . Moderna Sars-Covid-2 Vaccination 09/26/2019, 10/24/2019     Objective: Vital Signs: There were no vitals taken for this visit.   Physical Exam   Musculoskeletal Exam: ***  CDAI Exam: CDAI Score: -- Patient Global: --; Provider Global: -- Swollen: --; Tender: -- Joint Exam 03/14/2022   No joint exam has been documented for this visit   There is currently no information documented on the homunculus. Go to the Rheumatology activity and complete the homunculus joint exam.  Investigation: No additional findings.  Imaging: No results found.  Recent Labs: Lab Results  Component Value Date   WBC 6.9 02/04/2022   HGB 12.1 02/04/2022   PLT 258 02/04/2022   NA 140 02/04/2022   K 4.6 02/04/2022   CL 104 02/04/2022   CO2 26 02/04/2022   GLUCOSE 73 02/04/2022   BUN 15 02/04/2022   CREATININE 0.97 (H) 02/04/2022   BILITOT 0.4 02/04/2022  ALKPHOS 63 09/28/2020   AST 10 02/04/2022   ALT 4 (L) 02/04/2022   PROT 7.4 02/04/2022   ALBUMIN 4.2 09/28/2020   CALCIUM 9.7 02/04/2022   GFRAA 89 06/11/2020   QFTBGOLDPLUS NEGATIVE 07/15/2021    Speciality Comments: No specialty comments available.  Procedures:  No procedures performed Allergies: Penicillins   Assessment / Plan:     Visit Diagnoses: No  diagnosis found.  Orders: No orders of the defined types were placed in this encounter.  No orders of the defined types were placed in this encounter.   Face-to-face time spent with patient was *** minutes. Greater than 50% of time was spent in counseling and coordination of care.  Follow-Up Instructions: No follow-ups on file.   Ellen Henri, CMA  Note - This record has been created using Animal nutritionist.  Chart creation errors have been sought, but may not always  have been located. Such creation errors do not reflect on  the standard of medical care.

## 2022-03-03 ENCOUNTER — Other Ambulatory Visit: Payer: Self-pay | Admitting: Rheumatology

## 2022-03-03 ENCOUNTER — Other Ambulatory Visit: Payer: Self-pay | Admitting: Nurse Practitioner

## 2022-03-03 DIAGNOSIS — I10 Essential (primary) hypertension: Secondary | ICD-10-CM

## 2022-03-10 ENCOUNTER — Ambulatory Visit: Payer: Medicare Other

## 2022-03-10 ENCOUNTER — Ambulatory Visit: Payer: Medicare Other | Admitting: Nurse Practitioner

## 2022-03-14 ENCOUNTER — Ambulatory Visit: Payer: Medicare Other | Admitting: Physician Assistant

## 2022-03-14 DIAGNOSIS — Z8679 Personal history of other diseases of the circulatory system: Secondary | ICD-10-CM

## 2022-03-14 DIAGNOSIS — M0579 Rheumatoid arthritis with rheumatoid factor of multiple sites without organ or systems involvement: Secondary | ICD-10-CM

## 2022-03-14 DIAGNOSIS — Z79899 Other long term (current) drug therapy: Secondary | ICD-10-CM

## 2022-03-14 DIAGNOSIS — Z96653 Presence of artificial knee joint, bilateral: Secondary | ICD-10-CM

## 2022-03-14 DIAGNOSIS — Z8669 Personal history of other diseases of the nervous system and sense organs: Secondary | ICD-10-CM

## 2022-03-14 DIAGNOSIS — Z8639 Personal history of other endocrine, nutritional and metabolic disease: Secondary | ICD-10-CM

## 2022-03-14 DIAGNOSIS — Z87898 Personal history of other specified conditions: Secondary | ICD-10-CM

## 2022-03-14 DIAGNOSIS — I10 Essential (primary) hypertension: Secondary | ICD-10-CM

## 2022-03-14 DIAGNOSIS — M5136 Other intervertebral disc degeneration, lumbar region: Secondary | ICD-10-CM

## 2022-03-14 DIAGNOSIS — Z8719 Personal history of other diseases of the digestive system: Secondary | ICD-10-CM

## 2022-03-14 DIAGNOSIS — M810 Age-related osteoporosis without current pathological fracture: Secondary | ICD-10-CM

## 2022-03-14 DIAGNOSIS — Z8709 Personal history of other diseases of the respiratory system: Secondary | ICD-10-CM

## 2022-03-15 ENCOUNTER — Encounter (HOSPITAL_COMMUNITY): Payer: Self-pay | Admitting: Emergency Medicine

## 2022-03-15 ENCOUNTER — Ambulatory Visit (HOSPITAL_COMMUNITY)
Admission: EM | Admit: 2022-03-15 | Discharge: 2022-03-15 | Disposition: A | Discharge status: Home / Self Care | Payer: Medicare Other | Attending: Family Medicine | Admitting: Family Medicine

## 2022-03-15 DIAGNOSIS — R600 Localized edema: Secondary | ICD-10-CM

## 2022-03-15 DIAGNOSIS — L03115 Cellulitis of right lower limb: Secondary | ICD-10-CM

## 2022-03-15 MED ORDER — DOXYCYCLINE HYCLATE 100 MG PO CAPS: 100.0000 mg | ORAL_CAPSULE | Freq: Two times a day (BID) | ORAL | 0 refills | Status: AC

## 2022-03-15 MED ORDER — FUROSEMIDE 20 MG PO TABS: 20.0000 mg | ORAL_TABLET | Freq: Every day | ORAL | 0 refills | Status: AC

## 2022-03-15 NOTE — Discharge Instructions (Signed)
You were seen today for swelling in your legs and possible cellulitis.  I have sent out a water pill to take daily x 7 days.  You should take this in the morning so you are not up all night urinating.  I have sent out an antibiotic to take twice/day with food x 7 days.  Please avoid salt and elevate your legs when you can.  Please follow up with your primary care provider.

## 2022-03-15 NOTE — ED Provider Notes (Signed)
[facility]    CSN: [id number] Arrival date & time: 03/15/22  1131      History   Chief Complaint Chief Complaint  Patient presents with   Foot Swelling    HPI Marliss Buttacavoli is a 88 y.o. female.   Patient is here for bilateral foot swelling, right foot is worse than the left, and the feet have a darker look to them.  Started about 1 week ago.  No prior history of feet swelling.  She is drinking enough fluids.  Otherwise feeling well.  No cough or sob.  Has been on norvasc for a long time.   Past Medical History:  Diagnosis Date   Allergy    Anxiety    per pt's daughter   Arthritis    Asthma    pt reported   Depression    GERD (gastroesophageal reflux disease)    Hearing deficit L   High cholesterol    Hypertension    Osteoporosis    Rheumatoid arthritis (HCC)    Stroke [facility])     Patient Active Problem List   Diagnosis Date Noted   Chest pain    Hypotension 03/24/2020   Alzheimer's disease (HCC) 08/13/2019   Essential hypertension 08/13/2018   Acquired hypothyroidism 08/13/2018   Rash and nonspecific skin eruption 08/13/2018   Memory loss 04/12/2017   Depression 04/12/2017   Rheumatoid arthritis involving multiple sites with positive rheumatoid factor (HCC) 08/17/2016   High risk medication use 08/17/2016   H/O total knee replacement, bilateral 08/17/2016   Spondylosis of lumbar region without myelopathy or radiculopathy 08/17/2016   History of glaucoma/ patient is legally blind 08/17/2016   History of gastroesophageal reflux (GERD) 08/17/2016   History of diverticulosis 08/17/2016   History of hypothyroidism 08/17/2016   history of asthmatic bronchitis 08/17/2016   History of memory loss 08/17/2016   History of atherosclerosis 08/17/2016    Past Surgical History:  Procedure Laterality Date   ABDOMINAL HYSTERECTOMY     KNEE ARTHROPLASTY     REPLACEMENT TOTAL KNEE BILATERAL Bilateral    WRIST SURGERY Left     OB History   No  obstetric history on file.      Home Medications    Prior to Admission medications   Medication Sig Start Date End Date Taking? Authorizing Provider  albuterol (VENTOLIN HFA) 108 (90 Base) MCG/ACT inhaler Inhale 1 puff into the lungs every 6 (six) hours as needed for wheezing or shortness of breath. 06/03/19   Arnette Felts, FNP  amLODipine (NORVASC) 5 MG tablet TAKE 1 TABLET BY MOUTH EVERY DAY 03/04/22   Arnette Felts, FNP  aspirin EC 81 MG tablet Take 81 mg by mouth daily. Swallow whole.    [provider]  atropine 1 % ophthalmic solution Place 1 drop into the left eye daily. 03/11/21   [provider]  ENBREL SURECLICK 50 MG/ML injection INJECT 0.67mls into THE SKIN ONCE WEEKLY 02/23/22   Pollyann Savoy, MD  escitalopram (LEXAPRO) 10 MG tablet Take 1 tablet (10 mg total) by mouth daily. 01/26/22 01/26/23  Arnette Felts, FNP  feeding supplement, ENSURE ENLIVE, (ENSURE ENLIVE) LIQD Take 237 mLs by mouth daily in the afternoon. 03/26/20   Narda Bonds, MD  ferrous sulfate 325 (65 FE) MG tablet Take 325 mg by mouth daily with breakfast.    [provider]  fluticasone (FLONASE) 50 MCG/ACT nasal spray INSTILL 2 SPRAYS IN EACH NOSTRIL EVERY DAY Patient taking differently: Place 2 sprays into both nostrils  daily as needed for allergies. 06/12/18   Arnette Felts, FNP  folic acid (FOLVITE) 1 MG tablet TAKE 1 TABLET(1 MG) BY MOUTH DAILY Patient taking differently: Take 1 mg by mouth daily. 07/12/18   Pollyann Savoy, MD  latanoprost (XALATAN) 0.005 % ophthalmic solution Place 1 drop into both eyes at bedtime.  10/24/16   [provider]  loratadine (CLARITIN) 10 MG tablet TAKE 1 TABLET BY MOUTH EVERY DAY 03/04/22   Arnette Felts, FNP  [facility] 28-10 MG CP24 TAKE 1 CAPSULE BY MOUTH EVERY DAY 02/23/22   Arnette Felts, FNP  omeprazole (PRILOSEC) 20 MG capsule TAKE 1 CAPSULE BY MOUTH EVERY DAY 03/04/22   Arnette Felts, FNP  predniSONE (DELTASONE) 5 MG tablet Take 2 tablets  by mouth daily x1 wk, 1.5 tablets daily x1 wk, 1 tablet daily x1wk, half tablet daily x1wk. 10/13/21   Gearldine Bienenstock, PA-C  QUEtiapine (SEROQUEL) 100 MG tablet TAKE 1 TABLET BY MOUTH AT BEDTIME 02/23/22   Arnette Felts, FNP  simvastatin (ZOCOR) 20 MG tablet TAKE 1 TABLET BY MOUTH EVERY EVENING 12/15/21   Arnette Felts, FNP  SYNTHROID 75 MCG tablet TAKE 1 TABLET BY MOUTH EVERY DAY BEFORE breakfast 10/04/21   Arnette Felts, FNP  timolol (TIMOPTIC) 0.5 % ophthalmic solution 1 drop 2 (two) times daily. 06/17/20   [provider]  Vitamin D, Ergocalciferol, (DRISDOL) 1.25 MG (50000 UNIT) CAPS capsule TAKE 1 CAPSULE BY MOUTH TWICE A WEEK 12/15/21   Arnette Felts, FNP    Family History No family history on file.  Social History Social History   Tobacco Use   Smoking status: Former    Packs/day: 0.25    Years: 13.00    Total pack years: 3.25    Types: Cigarettes    Quit date: 08/18/2006    Years since quitting: 15.5    Passive exposure: Never   Smokeless tobacco: Never  Vaping Use   Vaping Use: Never used  Substance Use Topics   Alcohol use: No   Drug use: Not Currently    Types: Hydrocodone     Allergies   Penicillins   Review of Systems Review of Systems  Constitutional: Negative.   HENT: Negative.    Respiratory: Negative.    Cardiovascular:  Positive for leg swelling. Negative for chest pain.  Gastrointestinal: Negative.   Genitourinary: Negative.   Musculoskeletal: Negative.      Physical Exam Triage Vital Signs ED Triage Vitals [03/15/22 1210]  Enc Vitals Group     BP (!) 122/53     Pulse Rate 64     Resp 19     Temp 97.6 F (36.4 C)     Temp src      SpO2 100 %     Weight      Height      Head Circumference      Peak Flow      Pain Score      Pain Loc      Pain Edu?      Excl. in GC?    No data found.  Updated Vital Signs BP (!) 122/53 (BP Location: Right Arm)   Pulse 64   Temp 97.6 F (36.4 C)   Resp 19   SpO2 100%   Visual  Acuity Right Eye Distance:   Left Eye Distance:   Bilateral Distance:    Right Eye Near:   Left Eye Near:    Bilateral Near:     Physical Exam Constitutional:  Appearance: Normal appearance.  Cardiovascular:     Rate and Rhythm: Normal rate and regular rhythm.  Pulmonary:     Effort: Pulmonary effort is normal.     Breath sounds: Normal breath sounds.  Musculoskeletal:     Right lower leg: Edema present.     Left lower leg: Edema present.  Skin:    General: Skin is warm.     Findings: Erythema present.     Comments: Skin darkening and warmth to the right foot and ankle;  TTP on the right  Neurological:     General: No focal deficit present.     Mental Status: She is alert.  Psychiatric:        Mood and Affect: Mood normal.      UC Treatments / Results  Labs (all labs ordered are listed, but only abnormal results are displayed) Labs Reviewed - No data to display  EKG   Radiology No results found.  Procedures Procedures (including critical care time)  Medications Ordered in UC Medications - No data to display  Initial Impression / Assessment and Plan / UC Course  I have reviewed the triage vital signs and the nursing notes.  Pertinent labs & imaging results that were available during my care of the patient were reviewed by me and considered in my medical decision making (see chart for details).    Final Clinical Impressions(s) / UC Diagnoses   Final diagnoses:  Bilateral lower extremity edema  Cellulitis of right lower extremity     Discharge Instructions      You were seen today for swelling in your legs and possible cellulitis.  I have sent out a water pill to take daily x 7 days.  You should take this in the morning so you are not up all night urinating.  I have sent out an antibiotic to take twice/day with food x 7 days.  Please avoid salt and elevate your legs when you can.  Please follow up with your primary care provider.     ED  Prescriptions     Medication Sig Dispense Auth. Provider   furosemide (LASIX) 20 MG tablet Take 1 tablet (20 mg total) by mouth daily for 7 days. 7 tablet Kingslee Dowse, MD   doxycycline (VIBRAMYCIN) 100 MG capsule Take 1 capsule (100 mg total) by mouth 2 (two) times daily for 7 days. 14 capsule Jannifer Franklin, MD      PDMP not reviewed this encounter.   Jannifer Franklin, MD 03/15/22 1257

## 2022-03-15 NOTE — ED Triage Notes (Signed)
Pt had swelling in feet for about a week. Skin more on right foot turning dark.

## 2022-03-22 NOTE — Progress Notes (Signed)
This encounter was created in error - please disregard.

## 2022-03-23 ENCOUNTER — Other Ambulatory Visit: Payer: Self-pay | Admitting: Rheumatology

## 2022-03-23 NOTE — Telephone Encounter (Signed)
Next Visit: 04/25/2022  Last Visit: 10/13/2021  Last Fill: 02/23/2022  YB:FXOVANVBTY arthritis involving multiple sites with positive rheumatoid factor   Current Dose per office note 10/13/2021: Enbrel 50 mg sq injections once weekly.  Labs: 02/04/2022 Creatinine is borderline elevated-0.97 and GFR is slightly low-57.  She should avoid the use of NSAIDs and remain hydrated.   Rest of CMP WNL.  CBC stable.   TB Gold: 07/15/2021,  TB Gold is negative.  Okay to refill Enbrel?

## 2022-04-06 ENCOUNTER — Ambulatory Visit: Payer: Medicare Other | Admitting: Nurse Practitioner

## 2022-04-06 ENCOUNTER — Ambulatory Visit: Payer: Medicare Other

## 2022-04-11 DIAGNOSIS — Z87891 Personal history of nicotine dependence: Secondary | ICD-10-CM | POA: Diagnosis not present

## 2022-04-11 DIAGNOSIS — E039 Hypothyroidism, unspecified: Secondary | ICD-10-CM | POA: Diagnosis not present

## 2022-04-11 DIAGNOSIS — I10 Essential (primary) hypertension: Secondary | ICD-10-CM | POA: Diagnosis not present

## 2022-04-11 NOTE — Progress Notes (Deleted)
Office Visit Note  Patient: Jordan [name]             Date of Birth: March 20, 1934           MRN: [id number]             PCP: Arnette Felts, FNP Referring: Arnette Felts, FNP Visit Date: 04/25/2022 Occupation: @  Subjective:    History of Present Illness: Jordan [name] is a 88 y.o. female with history of seropositive rheumatoid arthritis, DDD, and osteoporosis.  Patient remains on Enbrel 50 mg subcutaneous injections once weekly.  CBC and CMP were drawn on 02/04/2022.  Her next lab work will be due in October and every 3 months to monitor for drug toxicity. TB Gold negative on 07/15/2021.  Future order for TB Gold placed today. Discussed the importance of holding Enbrel if she develops signs or symptoms of an infection and to resume once the infection has completely cleared. Discussed the importance of yearly skin examinations while on Enbrel.  Activities of Daily Living:  Patient reports morning stiffness for *** {minute/hour:19697}.   Patient {ACTIONS;DENIES/REPORTS:21021675::"Denies"} nocturnal pain.  Difficulty dressing/grooming: {ACTIONS;DENIES/REPORTS:21021675::"Denies"} Difficulty climbing stairs: {ACTIONS;DENIES/REPORTS:21021675::"Denies"} Difficulty getting out of chair: {ACTIONS;DENIES/REPORTS:21021675::"Denies"} Difficulty using hands for taps, buttons, cutlery, and/or writing: {ACTIONS;DENIES/REPORTS:21021675::"Denies"}  No Rheumatology ROS completed.   PMFS History:  Patient Active Problem List   Diagnosis Date Noted   Chest pain    Hypotension 03/24/2020   Alzheimer's disease (HCC) 08/13/2019   Essential hypertension 08/13/2018   Acquired hypothyroidism 08/13/2018   Rash and nonspecific skin eruption 08/13/2018   Memory loss 04/12/2017   Depression 04/12/2017   Rheumatoid arthritis involving multiple sites with positive rheumatoid factor (HCC) 08/17/2016   High risk medication use 08/17/2016   H/O total knee replacement, bilateral 08/17/2016    Spondylosis of lumbar region without myelopathy or radiculopathy 08/17/2016   History of glaucoma/ patient is legally blind 08/17/2016   History of gastroesophageal reflux (GERD) 08/17/2016   History of diverticulosis 08/17/2016   History of hypothyroidism 08/17/2016   history of asthmatic bronchitis 08/17/2016   History of memory loss 08/17/2016   History of atherosclerosis 08/17/2016    Past Medical History:  Diagnosis Date   Allergy    Anxiety    per pt's daughter   Arthritis    Asthma    pt reported   Depression    GERD (gastroesophageal reflux disease)    Hearing deficit L   High cholesterol    Hypertension    Osteoporosis    Rheumatoid arthritis (HCC)    Stroke (HCC)     No family history on file. Past Surgical History:  Procedure Laterality Date   ABDOMINAL HYSTERECTOMY     KNEE ARTHROPLASTY     REPLACEMENT TOTAL KNEE BILATERAL Bilateral    WRIST SURGERY Left    Social History   Social History Narrative   Lives at home with grandson.   Right-handed.   2-3 cups coffee per day.   Immunization History  Administered Date(s) Administered   Fluad Quad(high Dose 65+) 04/06/2020   Moderna SARS-COV2 Booster Vaccination 03/16/2021   Moderna Sars-Covid-2 Vaccination 09/26/2019, 10/24/2019     Objective: Vital Signs: There were no vitals taken for this visit.   Physical Exam Vitals and nursing note reviewed.  Constitutional:      Appearance: She is well-developed.  HENT:     Head: Normocephalic and atraumatic.  Eyes:     Conjunctiva/sclera: Conjunctivae normal.  Cardiovascular:     Rate and Rhythm: Normal rate  and regular rhythm.     Heart sounds: Normal heart sounds.  Pulmonary:     Effort: Pulmonary effort is normal.     Breath sounds: Normal breath sounds.  Abdominal:     General: Bowel sounds are normal.     Palpations: Abdomen is soft.  Musculoskeletal:     Cervical back: Normal range of motion.  Skin:    General: Skin is warm and dry.      Capillary Refill: Capillary refill takes less than 2 seconds.  Neurological:     Mental Status: She is alert and oriented to person, place, and time.  Psychiatric:        Behavior: Behavior normal.      Musculoskeletal Exam: ***  CDAI Exam: CDAI Score: -- Patient Global: --; Provider Global: -- Swollen: --; Tender: -- Joint Exam 04/25/2022   No joint exam has been documented for this visit   There is currently no information documented on the homunculus. Go to the Rheumatology activity and complete the homunculus joint exam.  Investigation: No additional findings.  Imaging: No results found.  Recent Labs: Lab Results  Component Value Date   WBC 6.9 02/04/2022   HGB 12.1 02/04/2022   PLT 258 02/04/2022   NA 140 02/04/2022   K 4.6 02/04/2022   CL 104 02/04/2022   CO2 26 02/04/2022   GLUCOSE 73 02/04/2022   BUN 15 02/04/2022   CREATININE 0.97 (H) 02/04/2022   BILITOT 0.4 02/04/2022   ALKPHOS 63 09/28/2020   AST 10 02/04/2022   ALT 4 (L) 02/04/2022   PROT 7.4 02/04/2022   ALBUMIN 4.2 09/28/2020   CALCIUM 9.7 02/04/2022   GFRAA 89 06/11/2020   QFTBGOLDPLUS NEGATIVE 07/15/2021    Speciality Comments: No specialty comments available.  Procedures:  No procedures performed Allergies: Penicillins   Assessment / Plan:     Visit Diagnoses: No diagnosis found.  Orders: No orders of the defined types were placed in this encounter.  No orders of the defined types were placed in this encounter.   Face-to-face time spent with patient was *** minutes. Greater than 50% of time was spent in counseling and coordination of care.  Follow-Up Instructions: No follow-ups on file.   Earnestine Mealing, CMA  Note - This record has been created using Editor, commissioning.  Chart creation errors have been sought, but may not always  have been located. Such creation errors do not reflect on  the standard of medical care.

## 2022-04-21 ENCOUNTER — Ambulatory Visit (INDEPENDENT_AMBULATORY_CARE_PROVIDER_SITE_OTHER): Payer: Medicare Other

## 2022-04-21 VITALS — Ht 60.0 in | Wt 120.0 lb

## 2022-04-21 DIAGNOSIS — Z0000 Encounter for general adult medical examination without abnormal findings: Secondary | ICD-10-CM

## 2022-04-21 NOTE — Progress Notes (Signed)
I connected with Jordan [name] today by telephone and verified that I am speaking with the correct person using two identifiers. Location patient: home Location provider: work Persons participating in the virtual visit: [name], [place] (daughter), Jordan Ponder LPN.   I discussed the limitations, risks, security and privacy concerns of performing an evaluation and management service by telephone and the availability of in person appointments. I also discussed with the patient that there may be a patient responsible charge related to this service. The patient expressed understanding and verbally consented to this telephonic visit.    Interactive audio and video telecommunications were attempted between this provider and patient, however failed, due to patient having technical difficulties OR patient did not have access to video capability.  We continued and completed visit with audio only.     Vital signs may be patient reported or missing.  Subjective:   Jordan [name] is a 88 y.o. female who presents for Medicare Annual (Subsequent) preventive examination.  Review of Systems     Cardiac Risk Factors include: advanced age (>60men, >24 women);hypertension     Objective:    Today's Vitals   04/21/22 0843  Weight: 120 lb (54.4 kg)  Height: 5' (1.524 m)   Body mass index is 23.44 kg/m.     04/21/2022    8:48 AM 02/04/2021   10:30 AM 03/25/2020    4:18 AM 01/15/2020   10:40 AM 12/04/2019    2:24 PM 01/08/2019   10:33 AM 01/16/2018    9:35 AM  Advanced Directives  Does Patient Have a Medical Advance Directive? Yes Yes No Yes No Yes Yes  Type of Sales promotion account executive of [employer] Power of [place];Living will  Healthcare Power of [employer] Power of Attorney  Copy of Healthcare Power of Attorney in Chart? No - copy requested No - copy requested  No - copy requested  No - copy requested   Would patient like  information on creating a medical advance directive?   No - Patient declined  No - Patient declined      Current Medications (verified) Outpatient Encounter Medications as of 04/21/2022  Medication Sig   albuterol (VENTOLIN HFA) 108 (90 Base) MCG/ACT inhaler Inhale 1 puff into the lungs every 6 (six) hours as needed for wheezing or shortness of breath.   amLODipine (NORVASC) 5 MG tablet TAKE 1 TABLET BY MOUTH EVERY DAY   aspirin EC 81 MG tablet Take 81 mg by mouth daily. Swallow whole.   atropine 1 % ophthalmic solution Place 1 drop into the left eye daily.   escitalopram (LEXAPRO) 10 MG tablet Take 1 tablet (10 mg total) by mouth daily.   etanercept (ENBREL SURECLICK) 50 MG/ML injection Inject 50 mg into the skin once a week.   feeding supplement, ENSURE ENLIVE, (ENSURE ENLIVE) LIQD Take 237 mLs by mouth daily in the afternoon.   ferrous sulfate 325 (65 FE) MG tablet Take 325 mg by mouth daily with breakfast.   fluticasone (FLONASE) 50 MCG/ACT nasal spray INSTILL 2 SPRAYS IN EACH NOSTRIL EVERY DAY (Patient taking differently: Place 2 sprays into both nostrils daily as needed for allergies.)   folic acid (FOLVITE) 1 MG tablet TAKE 1 TABLET(1 MG) BY MOUTH DAILY (Patient taking differently: Take 1 mg by mouth daily.)   latanoprost (XALATAN) 0.005 % ophthalmic solution Place 1 drop into both eyes at bedtime.    loratadine (CLARITIN) 10 MG tablet TAKE 1 TABLET BY MOUTH EVERY DAY  NAMZARIC 28-10 MG CP24 TAKE 1 CAPSULE BY MOUTH EVERY DAY   omeprazole (PRILOSEC) 20 MG capsule TAKE 1 CAPSULE BY MOUTH EVERY DAY   QUEtiapine (SEROQUEL) 100 MG tablet TAKE 1 TABLET BY MOUTH AT BEDTIME   simvastatin (ZOCOR) 20 MG tablet TAKE 1 TABLET BY MOUTH EVERY EVENING   SYNTHROID 75 MCG tablet TAKE 1 TABLET BY MOUTH EVERY DAY BEFORE breakfast   timolol (TIMOPTIC) 0.5 % ophthalmic solution 1 drop 2 (two) times daily.   Vitamin D, Ergocalciferol, (DRISDOL) 1.25 MG (50000 UNIT) CAPS capsule TAKE 1 CAPSULE BY MOUTH TWICE  A WEEK   furosemide (LASIX) 20 MG tablet Take 1 tablet (20 mg total) by mouth daily for 7 days.   predniSONE (DELTASONE) 5 MG tablet Take 2 tablets by mouth daily x1 wk, 1.5 tablets daily x1 wk, 1 tablet daily x1wk, half tablet daily x1wk. (Patient not taking: Reported on 04/21/2022)   No facility-administered encounter medications on file as of 04/21/2022.    Allergies (verified) Penicillins   History: Past Medical History:  Diagnosis Date   Allergy    Anxiety    per pt's daughter   Arthritis    Asthma    pt reported   Depression    GERD (gastroesophageal reflux disease)    Hearing deficit L   High cholesterol    Hypertension    Osteoporosis    Rheumatoid arthritis (HCC)    Stroke [facility])    Past Surgical History:  Procedure Laterality Date   ABDOMINAL HYSTERECTOMY     KNEE ARTHROPLASTY     REPLACEMENT TOTAL KNEE BILATERAL Bilateral    WRIST SURGERY Left    History reviewed. No pertinent family history. Social History   Socioeconomic History   Marital status: Single    Spouse name: Not on file   Number of children: 2   Years of education: 8th   Highest education level: Not on file  Occupational History   Occupation: Retired  Tobacco Use   Smoking status: Former    Packs/day: 0.25    Years: 13.00    Total pack years: 3.25    Types: Cigarettes    Quit date: 08/18/2006    Years since quitting: 15.6    Passive exposure: Never   Smokeless tobacco: Never  Vaping Use   Vaping Use: Never used  Substance and Sexual Activity   Alcohol use: No   Drug use: Not Currently    Types: Hydrocodone   Sexual activity: Not Currently  Other Topics Concern   Not on file  Social History Narrative   Lives at home with grandson.   Right-handed.   2-3 cups coffee per day.   Social Determinants of Health   Financial Resource Strain: Low Risk  (04/21/2022)   Overall Financial Resource Strain (CARDIA)    Difficulty of Paying Living Expenses: Not hard at all  Food Insecurity:  No Food Insecurity (04/21/2022)   Hunger Vital Sign    Worried About Running Out of Food in the Last Year: Never true    Ran Out of Food in the Last Year: Never true  Transportation Needs: No Transportation Needs (04/21/2022)   PRAPARE - Administrator, Civil Service (Medical): No    Lack of Transportation (Non-Medical): No  Physical Activity: Inactive (04/21/2022)   Exercise Vital Sign    Days of Exercise per Week: 0 days    Minutes of Exercise per Session: 0 min  Stress: No Stress Concern Present (04/21/2022)   [employer]  of Occupational Health - Occupational Stress Questionnaire    Feeling of Stress : Not at all  Social Connections: Not on file    Tobacco Counseling Counseling given: Not Answered   Clinical Intake:  Pre-visit preparation completed: Yes  Pain : No/denies pain     Nutritional Status: BMI of 19-24  Normal Nutritional Risks: None Diabetes: No  How often do you need to have someone help you when you read instructions, pamphlets, or other written materials from your doctor or pharmacy?: 3 - Sometimes  Diabetic? no  Interpreter Needed?: No  Information entered by :: NAllen LPN   Activities of Daily Living    04/21/2022    8:49 AM  In your present state of health, do you have any difficulty performing the following activities:  Hearing? 0  Vision? 1  Difficulty concentrating or making decisions? 1  Walking or climbing stairs? 1  Dressing or bathing? 1  Doing errands, shopping? 1  Preparing Food and eating ? Y  Using the Toilet? N  In the past six months, have you accidently leaked urine? N  Do you have problems with loss of bowel control? N  Managing your Medications? Y  Managing your Finances? Y  Housekeeping or managing your Housekeeping? Y    Patient Care Team: [name], FNP as PCP - General (General Practice)  Indicate any recent Medical Services you may have received from other than [facility] providers in the past year  (date may be approximate).     Assessment:   This is a routine wellness examination for [employer].  Hearing/Vision screen Vision Screening - Comments:: Regular eye exams, Dr. Edilia Bo  Dietary issues and exercise activities discussed: Current Exercise Habits: The patient does not participate in regular exercise at present   Goals Addressed             This Visit's Progress    Patient Stated       04/21/2022, no goals       Depression Screen    04/21/2022    8:49 AM 01/26/2022   12:44 PM 12/09/2021   10:14 AM 02/04/2021   10:32 AM 01/15/2020   10:42 AM 01/15/2020   10:11 AM 06/24/2019   11:45 AM  PHQ 2/9 Scores  PHQ - 2 Score 0 0 1 0  3 0  PHQ- 9 Score   9   21   Exception Documentation     Other- indicate reason in comment box    Not completed     completed by CMA      Fall Risk    04/21/2022    8:49 AM 02/04/2021   10:31 AM 01/15/2020   10:42 AM 06/24/2019   11:45 AM 03/14/2019   12:18 PM  Fall Risk   Falls in the past year? 0 0 1 0 0  Comment   lost balance    Number falls in past yr: 0  0    Injury with Fall? 0  1    Comment   sutures over right eye    Risk for fall due to : Impaired mobility;Medication side effect Impaired mobility;Impaired vision;Medication side effect History of fall(s);Impaired balance/gait;Medication side effect;Impaired vision    Follow up Falls prevention discussed;Falls evaluation completed;Education provided Falls evaluation completed;Education provided;Falls prevention discussed Falls evaluation completed;Education provided;Falls prevention discussed      FALL RISK PREVENTION PERTAINING TO THE HOME:  Any stairs in or around the home? No  If so, are there any without handrails? N/a Home  free of loose throw rugs in walkways, pet beds, electrical cords, etc? Yes  Adequate lighting in your home to reduce risk of falls? Yes   ASSISTIVE DEVICES UTILIZED TO PREVENT FALLS:  Life alert? No  Use of a cane, walker or w/c? Yes  Grab bars in the  bathroom? Yes  Shower chair or bench in shower? Yes  Elevated toilet seat or a handicapped toilet? Yes   TIMED UP AND GO:  Was the test performed? No .      Cognitive Function:    07/13/2017   12:10 PM  MMSE - Mini Mental State Exam  Orientation to time 3  Orientation to Place 5  Registration 3  Attention/ Calculation 5  Recall 0  Language- name 2 objects 2  Language- repeat 1  Language- follow 3 step command 3  Language- read & follow direction 1  Write a sentence 1  Copy design 1  Total score 25        01/08/2019   10:39 AM  6CIT Screen  What Year? 0 points  What month? 0 points  What time? 3 points  Count back from 20 2 points  Months in reverse 4 points  Repeat phrase 10 points  Total Score 19 points    Immunizations Immunization History  Administered Date(s) Administered   Fluad Quad(high Dose 65+) 04/06/2020   Moderna SARS-COV2 Booster Vaccination 03/16/2021   Moderna Sars-Covid-2 Vaccination 09/26/2019, 10/24/2019    TDAP status: Due, Education has been provided regarding the importance of this vaccine. Advised may receive this vaccine at local pharmacy or Health Dept. Aware to provide a copy of the vaccination record if obtained from local pharmacy or Health Dept. Verbalized acceptance and understanding.  Flu Vaccine status: Due, Education has been provided regarding the importance of this vaccine. Advised may receive this vaccine at local pharmacy or Health Dept. Aware to provide a copy of the vaccination record if obtained from local pharmacy or Health Dept. Verbalized acceptance and understanding.  Pneumococcal vaccine status: Due, Education has been provided regarding the importance of this vaccine. Advised may receive this vaccine at local pharmacy or Health Dept. Aware to provide a copy of the vaccination record if obtained from local pharmacy or Health Dept. Verbalized acceptance and understanding.  Covid-19 vaccine status: Completed  vaccines  Qualifies for Shingles Vaccine? Yes   Zostavax completed No   Shingrix Completed?: No.    Education has been provided regarding the importance of this vaccine. Patient has been advised to call insurance company to determine out of pocket expense if they have not yet received this vaccine. Advised may also receive vaccine at local pharmacy or Health Dept. Verbalized acceptance and understanding.  Screening Tests Health Maintenance  Topic Date Due   TETANUS/TDAP  Never done   Zoster Vaccines- Shingrix (1 of 2) Never done   Pneumonia Vaccine 33+ Years old (1 - PCV) Never done   COVID-19 Vaccine (3 - Moderna risk series) 04/13/2021   INFLUENZA VACCINE  02/22/2022   MAMMOGRAM  07/20/2022   DEXA SCAN  Completed   HPV VACCINES  Aged Out    Health Maintenance  Health Maintenance Due  Topic Date Due   TETANUS/TDAP  Never done   Zoster Vaccines- Shingrix (1 of 2) Never done   Pneumonia Vaccine 70+ Years old (1 - PCV) Never done   COVID-19 Vaccine (3 - Moderna risk series) 04/13/2021   INFLUENZA VACCINE  02/22/2022    Colorectal cancer screening: No longer required.  Mammogram status: No longer required due to age.  Bone Density status: Completed 07/20/2021.   Lung Cancer Screening: (Low Dose CT Chest recommended if Age 40-80 years, 30 pack-year currently smoking OR have quit w/in 15years.) does not qualify.   Lung Cancer Screening Referral: no  Additional Screening:  Hepatitis C Screening: does not qualify;   Vision Screening: Recommended annual ophthalmology exams for early detection of glaucoma and other disorders of the eye. Is the patient up to date with their annual eye exam?  Yes  Who is the provider or what is the name of the office in which the patient attends annual eye exams? Dr. [name] If pt is not established with a provider, would they like to be referred to a provider to establish care? No .   Dental Screening: Recommended annual dental exams for proper  oral hygiene  Community Resource Referral / Chronic Care Management: CRR required this visit?  No   CCM required this visit?  No      Plan:     I have personally reviewed and noted the following in the patient's chart:   Medical and social history Use of alcohol, tobacco or illicit drugs  Current medications and supplements including opioid prescriptions. Patient is not currently taking opioid prescriptions. Functional ability and status Nutritional status Physical activity Advanced directives List of other physicians Hospitalizations, surgeries, and ER visits in previous 12 months Vitals Screenings to include cognitive, depression, and falls Referrals and appointments  In addition, I have reviewed and discussed with patient certain preventive protocols, quality metrics, and best practice recommendations. A written personalized care plan for preventive services as well as general preventive health recommendations were provided to patient.     Barb Merino, LPN   8/59/2924   Nurse Notes: 6 CIT not administered. Patient has diagnosis of Alzheimer's  Due to this being a virtual visit, the after visit summary with patients personalized plan was offered to patient via mail or my-chart.  to pick up at office at next visit

## 2022-04-21 NOTE — Patient Instructions (Signed)
Jordan [name] , Thank you for taking time to come for your Medicare Wellness Visit. I appreciate your ongoing commitment to your health goals. Please review the following plan we discussed and let me know if I can assist you in the future.   Screening recommendations/referrals: Colonoscopy: not required Mammogram: not required Bone Density: completed 07/20/2021 Recommended yearly ophthalmology/optometry visit for glaucoma screening and checkup Recommended yearly dental visit for hygiene and checkup  Vaccinations: Influenza vaccine: due Pneumococcal vaccine: due Tdap vaccine: due Shingles vaccine: discussed   Covid-19: 03/16/2021, 10/24/2019, 09/26/2019  Advanced directives: Please bring a copy of your POA (Power of Attorney) and/or Living Will to your next appointment.   Conditions/risks identified: none  Next appointment: Follow up in one year for your annual wellness visit    Preventive Care 65 Years and Older, Female Preventive care refers to lifestyle choices and visits with your health care provider that can promote health and wellness. What does preventive care include? A yearly physical exam. This is also called an annual well check. Dental exams once or twice a year. Routine eye exams. Ask your health care provider how often you should have your eyes checked. Personal lifestyle choices, including: Daily care of your teeth and gums. Regular physical activity. Eating a healthy diet. Avoiding tobacco and drug use. Limiting alcohol use. Practicing safe sex. Taking low-dose aspirin every day. Taking vitamin and mineral supplements as recommended by your health care provider. What happens during an annual well check? The services and screenings done by your health care provider during your annual well check will depend on your age, overall health, lifestyle risk factors, and family history of disease. Counseling  Your health care provider may ask you questions about  your: Alcohol use. Tobacco use. Drug use. Emotional well-being. Home and relationship well-being. Sexual activity. Eating habits. History of falls. Memory and ability to understand (cognition). Work and work Statistician. Reproductive health. Screening  You may have the following tests or measurements: Height, weight, and BMI. Blood pressure. Lipid and cholesterol levels. These may be checked every 5 years, or more frequently if you are over 65 years old. Skin check. Lung cancer screening. You may have this screening every year starting at age 35 if you have a 30-pack-year history of smoking and currently smoke or have quit within the past 15 years. Fecal occult blood test (FOBT) of the stool. You may have this test every year starting at age 73. Flexible sigmoidoscopy or colonoscopy. You may have a sigmoidoscopy every 5 years or a colonoscopy every 10 years starting at age 33. Hepatitis C blood test. Hepatitis B blood test. Sexually transmitted disease (STD) testing. Diabetes screening. This is done by checking your blood sugar (glucose) after you have not eaten for a while (fasting). You may have this done every 1-3 years. Bone density scan. This is done to screen for osteoporosis. You may have this done starting at age 24. Mammogram. This may be done every 1-2 years. Talk to your health care provider about how often you should have regular mammograms. Talk with your health care provider about your test results, treatment options, and if necessary, the need for more tests. Vaccines  Your health care provider may recommend certain vaccines, such as: Influenza vaccine. This is recommended every year. Tetanus, diphtheria, and acellular pertussis (Tdap, Td) vaccine. You may need a Td booster every 10 years. Zoster vaccine. You may need this after age 67. Pneumococcal 13-valent conjugate (PCV13) vaccine. One dose is recommended after age 35.  Pneumococcal polysaccharide (PPSV23) vaccine.  One dose is recommended after age 20. Talk to your health care provider about which screenings and vaccines you need and how often you need them. This information is not intended to replace advice given to you by your health care provider. Make sure you discuss any questions you have with your health care provider. Document Released: 08/07/2015 Document Revised: 03/30/2016 Document Reviewed: 05/12/2015 Elsevier Interactive Patient Education  2017 [place] Prevention in the Home Falls can cause injuries. They can happen to people of all ages. There are many things you can do to make your home safe and to help prevent falls. What can I do on the outside of my home? Regularly fix the edges of walkways and driveways and fix any cracks. Remove anything that might make you trip as you walk through a door, such as a raised step or threshold. Trim any bushes or trees on the path to your home. Use bright outdoor lighting. Clear any walking paths of anything that might make someone trip, such as rocks or tools. Regularly check to see if handrails are loose or broken. Make sure that both sides of any steps have handrails. Any raised decks and porches should have guardrails on the edges. Have any leaves, snow, or ice cleared regularly. Use sand or salt on walking paths during winter. Clean up any spills in your garage right away. This includes oil or grease spills. What can I do in the bathroom? Use night lights. Install grab bars by the toilet and in the tub and shower. Do not use towel bars as grab bars. Use non-skid mats or decals in the tub or shower. If you need to sit down in the shower, use a plastic, non-slip stool. Keep the floor dry. Clean up any water that spills on the floor as soon as it happens. Remove soap buildup in the tub or shower regularly. Attach bath mats securely with double-sided non-slip rug tape. Do not have throw rugs and other things on the floor that can make  you trip. What can I do in the bedroom? Use night lights. Make sure that you have a light by your bed that is easy to reach. Do not use any sheets or blankets that are too big for your bed. They should not hang down onto the floor. Have a firm chair that has side arms. You can use this for support while you get dressed. Do not have throw rugs and other things on the floor that can make you trip. What can I do in the kitchen? Clean up any spills right away. Avoid walking on wet floors. Keep items that you use a lot in easy-to-reach places. If you need to reach something above you, use a strong step stool that has a grab bar. Keep electrical cords out of the way. Do not use floor polish or wax that makes floors slippery. If you must use wax, use non-skid floor wax. Do not have throw rugs and other things on the floor that can make you trip. What can I do with my stairs? Do not leave any items on the stairs. Make sure that there are handrails on both sides of the stairs and use them. Fix handrails that are broken or loose. Make sure that handrails are as long as the stairways. Check any carpeting to make sure that it is firmly attached to the stairs. Fix any carpet that is loose or worn. Avoid having throw rugs at the  top or bottom of the stairs. If you do have throw rugs, attach them to the floor with carpet tape. Make sure that you have a light switch at the top of the stairs and the bottom of the stairs. If you do not have them, ask someone to add them for you. What else can I do to help prevent falls? Wear shoes that: Do not have high heels. Have rubber bottoms. Are comfortable and fit you well. Are closed at the toe. Do not wear sandals. If you use a stepladder: Make sure that it is fully opened. Do not climb a closed stepladder. Make sure that both sides of the stepladder are locked into place. Ask someone to hold it for you, if possible. Clearly mark and make sure that you can  see: Any grab bars or handrails. First and last steps. Where the edge of each step is. Use tools that help you move around (mobility aids) if they are needed. These include: Canes. Walkers. Scooters. Crutches. Turn on the lights when you go into a dark area. Replace any light bulbs as soon as they burn out. Set up your furniture so you have a clear path. Avoid moving your furniture around. If any of your floors are uneven, fix them. If there are any pets around you, be aware of where they are. Review your medicines with your doctor. Some medicines can make you feel dizzy. This can increase your chance of falling. Ask your doctor what other things that you can do to help prevent falls. This information is not intended to replace advice given to you by your health care provider. Make sure you discuss any questions you have with your health care provider. Document Released: 05/07/2009 Document Revised: 12/17/2015 Document Reviewed: 08/15/2014 Elsevier Interactive Patient Education  2017 [employer].

## 2022-04-25 ENCOUNTER — Ambulatory Visit: Payer: Medicare Other | Attending: Rheumatology | Admitting: Physician Assistant

## 2022-04-25 DIAGNOSIS — I10 Essential (primary) hypertension: Secondary | ICD-10-CM

## 2022-04-25 DIAGNOSIS — Z96653 Presence of artificial knee joint, bilateral: Secondary | ICD-10-CM

## 2022-04-25 DIAGNOSIS — Z8669 Personal history of other diseases of the nervous system and sense organs: Secondary | ICD-10-CM

## 2022-04-25 DIAGNOSIS — Z79899 Other long term (current) drug therapy: Secondary | ICD-10-CM

## 2022-04-25 DIAGNOSIS — Z8679 Personal history of other diseases of the circulatory system: Secondary | ICD-10-CM

## 2022-04-25 DIAGNOSIS — Z8709 Personal history of other diseases of the respiratory system: Secondary | ICD-10-CM

## 2022-04-25 DIAGNOSIS — Z8639 Personal history of other endocrine, nutritional and metabolic disease: Secondary | ICD-10-CM

## 2022-04-25 DIAGNOSIS — M810 Age-related osteoporosis without current pathological fracture: Secondary | ICD-10-CM

## 2022-04-25 DIAGNOSIS — Z87898 Personal history of other specified conditions: Secondary | ICD-10-CM

## 2022-04-25 DIAGNOSIS — M5136 Other intervertebral disc degeneration, lumbar region: Secondary | ICD-10-CM

## 2022-04-25 DIAGNOSIS — M0579 Rheumatoid arthritis with rheumatoid factor of multiple sites without organ or systems involvement: Secondary | ICD-10-CM

## 2022-04-25 DIAGNOSIS — Z8719 Personal history of other diseases of the digestive system: Secondary | ICD-10-CM

## 2022-04-28 ENCOUNTER — Other Ambulatory Visit: Payer: Self-pay | Admitting: Nurse Practitioner

## 2022-04-28 DIAGNOSIS — I10 Essential (primary) hypertension: Secondary | ICD-10-CM

## 2022-06-02 ENCOUNTER — Other Ambulatory Visit: Payer: Self-pay | Admitting: Physician Assistant

## 2022-06-02 ENCOUNTER — Other Ambulatory Visit: Payer: Self-pay | Admitting: Nurse Practitioner

## 2022-06-02 DIAGNOSIS — E559 Vitamin D deficiency, unspecified: Secondary | ICD-10-CM

## 2022-06-14 ENCOUNTER — Other Ambulatory Visit: Payer: Self-pay | Admitting: Nurse Practitioner

## 2022-06-14 ENCOUNTER — Other Ambulatory Visit: Payer: Self-pay | Admitting: Physician Assistant

## 2022-06-15 ENCOUNTER — Other Ambulatory Visit: Payer: Self-pay | Admitting: Nurse Practitioner

## 2022-06-15 DIAGNOSIS — E039 Hypothyroidism, unspecified: Secondary | ICD-10-CM

## 2022-06-28 ENCOUNTER — Other Ambulatory Visit: Payer: Self-pay | Admitting: Nurse Practitioner

## 2022-06-28 DIAGNOSIS — I10 Essential (primary) hypertension: Secondary | ICD-10-CM

## 2022-07-15 ENCOUNTER — Other Ambulatory Visit: Payer: Self-pay | Admitting: *Deleted

## 2022-07-15 DIAGNOSIS — Z79899 Other long term (current) drug therapy: Secondary | ICD-10-CM

## 2022-07-15 DIAGNOSIS — Z111 Encounter for screening for respiratory tuberculosis: Secondary | ICD-10-CM

## 2022-07-17 LAB — CBC WITH DIFFERENTIAL/PLATELET
Absolute Monocytes: 779 cells/uL (ref 200–950)
Basophils Absolute: 59 cells/uL (ref 0–200)
Basophils Relative: 0.9 %
Eosinophils Absolute: 79 cells/uL (ref 15–500)
Eosinophils Relative: 1.2 %
HCT: 38.8 % (ref 35.0–45.0)
Hemoglobin: 12.4 g/dL (ref 11.7–15.5)
Lymphs Abs: 3366 cells/uL (ref 850–3900)
MCH: 23.7 pg — ABNORMAL LOW (ref 27.0–33.0)
MCHC: 32 g/dL (ref 32.0–36.0)
MCV: 74.2 fL — ABNORMAL LOW (ref 80.0–100.0)
MPV: 11.3 fL (ref 7.5–12.5)
Monocytes Relative: 11.8 %
Neutro Abs: 2317 cells/uL (ref 1500–7800)
Neutrophils Relative %: 35.1 %
Platelets: 227 10*3/uL (ref 140–400)
RBC: 5.23 10*6/uL — ABNORMAL HIGH (ref 3.80–5.10)
RDW: 16 % — ABNORMAL HIGH (ref 11.0–15.0)
Total Lymphocyte: 51 %
WBC: 6.6 10*3/uL (ref 3.8–10.8)

## 2022-07-17 LAB — COMPLETE METABOLIC PANEL WITH GFR
AG Ratio: 1.2 (calc) (ref 1.0–2.5)
ALT: 5 U/L — ABNORMAL LOW (ref 6–29)
AST: 14 U/L (ref 10–35)
Albumin: 4 g/dL (ref 3.6–5.1)
Alkaline phosphatase (APISO): 52 U/L (ref 37–153)
BUN/Creatinine Ratio: 12 (calc) (ref 6–22)
BUN: 12 mg/dL (ref 7–25)
CO2: 25 mmol/L (ref 20–32)
Calcium: 9.4 mg/dL (ref 8.6–10.4)
Chloride: 106 mmol/L (ref 98–110)
Creat: 1.01 mg/dL — ABNORMAL HIGH (ref 0.60–0.95)
Globulin: 3.3 g/dL (calc) (ref 1.9–3.7)
Glucose, Bld: 86 mg/dL (ref 65–99)
Potassium: 4.6 mmol/L (ref 3.5–5.3)
Sodium: 140 mmol/L (ref 135–146)
Total Bilirubin: 0.3 mg/dL (ref 0.2–1.2)
Total Protein: 7.3 g/dL (ref 6.1–8.1)
eGFR: 54 mL/min/{1.73_m2} — ABNORMAL LOW (ref >=60)

## 2022-07-17 LAB — QUANTIFERON-TB GOLD PLUS
Mitogen-NIL: >10 IU/mL
NIL: 0.05 IU/mL
QuantiFERON-TB Gold Plus: NEGATIVE
TB1-NIL: 0.02 IU/mL
TB2-NIL: 0.01 IU/mL

## 2022-07-20 NOTE — Progress Notes (Signed)
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  CBC and CMP stable.   Please schedule routine follow up visit.

## 2022-08-08 ENCOUNTER — Other Ambulatory Visit: Payer: Self-pay | Admitting: Nurse Practitioner

## 2022-08-08 DIAGNOSIS — I10 Essential (primary) hypertension: Secondary | ICD-10-CM

## 2022-10-31 ENCOUNTER — Other Ambulatory Visit: Payer: Self-pay | Admitting: Nurse Practitioner

## 2022-10-31 DIAGNOSIS — E039 Hypothyroidism, unspecified: Secondary | ICD-10-CM

## 2022-11-28 ENCOUNTER — Other Ambulatory Visit: Payer: Self-pay | Admitting: Nurse Practitioner

## 2022-11-28 DIAGNOSIS — E039 Hypothyroidism, unspecified: Secondary | ICD-10-CM

## 2023-02-03 ENCOUNTER — Other Ambulatory Visit: Payer: Self-pay | Admitting: Nurse Practitioner

## 2023-07-12 ENCOUNTER — Other Ambulatory Visit: Payer: Self-pay | Admitting: Nurse Practitioner

## 2023-08-04 ENCOUNTER — Other Ambulatory Visit: Payer: Self-pay | Admitting: Nurse Practitioner

## 2024-02-20 ENCOUNTER — Encounter: Payer: Self-pay | Admitting: Podiatry

## 2024-02-20 ENCOUNTER — Ambulatory Visit (INDEPENDENT_AMBULATORY_CARE_PROVIDER_SITE_OTHER): Admitting: Podiatry

## 2024-02-20 DIAGNOSIS — M79674 Pain in right toe(s): Secondary | ICD-10-CM

## 2024-02-20 DIAGNOSIS — G309 Alzheimer's disease, unspecified: Secondary | ICD-10-CM

## 2024-02-20 DIAGNOSIS — M79675 Pain in left toe(s): Secondary | ICD-10-CM | POA: Diagnosis not present

## 2024-02-20 DIAGNOSIS — B351 Tinea unguium: Secondary | ICD-10-CM | POA: Diagnosis not present

## 2024-02-20 DIAGNOSIS — F0280 Dementia in other diseases classified elsewhere without behavioral disturbance: Secondary | ICD-10-CM

## 2024-02-20 DIAGNOSIS — Z8709 Personal history of other diseases of the respiratory system: Secondary | ICD-10-CM

## 2024-02-20 NOTE — Progress Notes (Signed)
This patient presents to the office with chief complaint of long thick painful nails.  Patient says the nails are painful walking and wearing shoes.  This patient is unable to self treat.  This patient is unable to trim her nails since she is unable to reach her nails. She presents to the office with her daughter. She presents to the office for preventative foot care services.   General Appearance  Alert, conversant and in no acute stress.  Vascular  Dorsalis pedis and posterior tibial  pulses are weakly  palpable  bilaterally.  Capillary return is within normal limits  bilaterally. Temperature is within normal limits  bilaterally.  Neurologic  Senn-Weinstein monofilament wire test within normal limits  bilaterally. Muscle power within normal limits bilaterally.  Nails Thick disfigured discolored nails with subungual debris  from hallux to fifth toes bilaterally. No evidence of bacterial infection or drainage bilaterally.  Orthopedic  No limitations of motion  feet .  No crepitus or effusions noted.  No bony pathology or digital deformities noted.  Skin  normotropic skin with no porokeratosis noted bilaterally.  No signs of infections or ulcers noted.     Onychomycosis  Nails  B/L.  Pain in right toes  Pain in left toes  Debridement of nails both feet followed trimming the nails with dremel tool.    RTC 3 months.   Gardiner Barefoot DPM

## 2024-02-21 ENCOUNTER — Ambulatory Visit: Admitting: Podiatry

## 2024-04-14 ENCOUNTER — Inpatient Hospital Stay (HOSPITAL_COMMUNITY)
Admission: EM | Admit: 2024-04-14 | Discharge: 2024-04-19 | DRG: 871 | Disposition: A | Discharge status: Home / Self Care | Attending: Internal Medicine | Admitting: Internal Medicine

## 2024-04-14 ENCOUNTER — Observation Stay (HOSPITAL_COMMUNITY)

## 2024-04-14 ENCOUNTER — Emergency Department (HOSPITAL_COMMUNITY)

## 2024-04-14 ENCOUNTER — Other Ambulatory Visit: Payer: Self-pay

## 2024-04-14 ENCOUNTER — Encounter (HOSPITAL_COMMUNITY): Payer: Self-pay

## 2024-04-14 DIAGNOSIS — F0280 Dementia in other diseases classified elsewhere without behavioral disturbance: Secondary | ICD-10-CM | POA: Diagnosis present

## 2024-04-14 DIAGNOSIS — A419 Sepsis, unspecified organism: Secondary | ICD-10-CM | POA: Diagnosis present

## 2024-04-14 DIAGNOSIS — Z8639 Personal history of other endocrine, nutritional and metabolic disease: Secondary | ICD-10-CM

## 2024-04-14 DIAGNOSIS — D849 Immunodeficiency, unspecified: Secondary | ICD-10-CM | POA: Diagnosis present

## 2024-04-14 DIAGNOSIS — E7800 Pure hypercholesterolemia, unspecified: Secondary | ICD-10-CM | POA: Diagnosis present

## 2024-04-14 DIAGNOSIS — R296 Repeated falls: Secondary | ICD-10-CM | POA: Diagnosis present

## 2024-04-14 DIAGNOSIS — R7881 Bacteremia: Secondary | ICD-10-CM | POA: Diagnosis not present

## 2024-04-14 DIAGNOSIS — Y92008 Other place in unspecified non-institutional (private) residence as the place of occurrence of the external cause: Secondary | ICD-10-CM | POA: Diagnosis not present

## 2024-04-14 DIAGNOSIS — A4151 Sepsis due to Escherichia coli [E. coli]: Principal | ICD-10-CM | POA: Diagnosis present

## 2024-04-14 DIAGNOSIS — G309 Alzheimer's disease, unspecified: Secondary | ICD-10-CM | POA: Diagnosis present

## 2024-04-14 DIAGNOSIS — Z87891 Personal history of nicotine dependence: Secondary | ICD-10-CM | POA: Diagnosis not present

## 2024-04-14 DIAGNOSIS — Z1152 Encounter for screening for COVID-19: Secondary | ICD-10-CM | POA: Diagnosis not present

## 2024-04-14 DIAGNOSIS — J45909 Unspecified asthma, uncomplicated: Secondary | ICD-10-CM | POA: Diagnosis present

## 2024-04-14 DIAGNOSIS — B9620 Unspecified Escherichia coli [E. coli] as the cause of diseases classified elsewhere: Secondary | ICD-10-CM

## 2024-04-14 DIAGNOSIS — E039 Hypothyroidism, unspecified: Secondary | ICD-10-CM | POA: Diagnosis present

## 2024-04-14 DIAGNOSIS — Z8719 Personal history of other diseases of the digestive system: Secondary | ICD-10-CM

## 2024-04-14 DIAGNOSIS — F02A0 Dementia in other diseases classified elsewhere, mild, without behavioral disturbance, psychotic disturbance, mood disturbance, and anxiety: Secondary | ICD-10-CM | POA: Diagnosis present

## 2024-04-14 DIAGNOSIS — E876 Hypokalemia: Secondary | ICD-10-CM | POA: Diagnosis present

## 2024-04-14 DIAGNOSIS — W1839XA Other fall on same level, initial encounter: Secondary | ICD-10-CM | POA: Diagnosis present

## 2024-04-14 DIAGNOSIS — Z7982 Long term (current) use of aspirin: Secondary | ICD-10-CM | POA: Diagnosis not present

## 2024-04-14 DIAGNOSIS — Z8673 Personal history of transient ischemic attack (TIA), and cerebral infarction without residual deficits: Secondary | ICD-10-CM | POA: Diagnosis not present

## 2024-04-14 DIAGNOSIS — Z880 Allergy status to penicillin: Secondary | ICD-10-CM

## 2024-04-14 DIAGNOSIS — N179 Acute kidney failure, unspecified: Secondary | ICD-10-CM | POA: Diagnosis present

## 2024-04-14 DIAGNOSIS — N390 Urinary tract infection, site not specified: Secondary | ICD-10-CM | POA: Diagnosis present

## 2024-04-14 DIAGNOSIS — K219 Gastro-esophageal reflux disease without esophagitis: Secondary | ICD-10-CM | POA: Diagnosis present

## 2024-04-14 DIAGNOSIS — I10 Essential (primary) hypertension: Secondary | ICD-10-CM | POA: Diagnosis present

## 2024-04-14 DIAGNOSIS — Z90710 Acquired absence of both cervix and uterus: Secondary | ICD-10-CM

## 2024-04-14 DIAGNOSIS — H548 Legal blindness, as defined in USA: Secondary | ICD-10-CM | POA: Diagnosis present

## 2024-04-14 DIAGNOSIS — M810 Age-related osteoporosis without current pathological fracture: Secondary | ICD-10-CM | POA: Diagnosis present

## 2024-04-14 DIAGNOSIS — G9341 Metabolic encephalopathy: Secondary | ICD-10-CM | POA: Diagnosis present

## 2024-04-14 DIAGNOSIS — Z79899 Other long term (current) drug therapy: Secondary | ICD-10-CM | POA: Diagnosis not present

## 2024-04-14 DIAGNOSIS — R6510 Systemic inflammatory response syndrome (SIRS) of non-infectious origin without acute organ dysfunction: Secondary | ICD-10-CM | POA: Diagnosis present

## 2024-04-14 DIAGNOSIS — H9192 Unspecified hearing loss, left ear: Secondary | ICD-10-CM | POA: Diagnosis present

## 2024-04-14 DIAGNOSIS — Z96653 Presence of artificial knee joint, bilateral: Secondary | ICD-10-CM | POA: Diagnosis present

## 2024-04-14 DIAGNOSIS — Z5329 Procedure and treatment not carried out because of patient's decision for other reasons: Secondary | ICD-10-CM | POA: Diagnosis present

## 2024-04-14 DIAGNOSIS — Z79890 Hormone replacement therapy (postmenopausal): Secondary | ICD-10-CM

## 2024-04-14 DIAGNOSIS — B9561 Methicillin susceptible Staphylococcus aureus infection as the cause of diseases classified elsewhere: Secondary | ICD-10-CM

## 2024-04-14 DIAGNOSIS — W19XXXA Unspecified fall, initial encounter: Principal | ICD-10-CM

## 2024-04-14 DIAGNOSIS — Z723 Lack of physical exercise: Secondary | ICD-10-CM

## 2024-04-14 DIAGNOSIS — Z1629 Resistance to other single specified antibiotic: Secondary | ICD-10-CM | POA: Diagnosis not present

## 2024-04-14 DIAGNOSIS — M0579 Rheumatoid arthritis with rheumatoid factor of multiple sites without organ or systems involvement: Secondary | ICD-10-CM | POA: Diagnosis present

## 2024-04-14 LAB — PROTIME-INR
INR: 1.2 (ref 0.8–1.2)
Prothrombin Time: 16 s — ABNORMAL HIGH (ref 11.4–15.2)

## 2024-04-14 LAB — BLOOD CULTURE ID PANEL (REFLEXED) - BCID2

## 2024-04-14 LAB — CBC WITH DIFFERENTIAL/PLATELET
Abs Immature Granulocytes: 0.14 K/uL — ABNORMAL HIGH (ref 0.00–0.07)
Basophils Absolute: 0 K/uL (ref 0.0–0.1)
Basophils Relative: 0 %
Eosinophils Absolute: 0 K/uL (ref 0.0–0.5)
Eosinophils Relative: 0 %
HCT: 34 % — ABNORMAL LOW (ref 36.0–46.0)
Hemoglobin: 11.1 g/dL — ABNORMAL LOW (ref 12.0–15.0)
Immature Granulocytes: 1 %
Lymphocytes Relative: 10 %
Lymphs Abs: 2 K/uL (ref 0.7–4.0)
MCH: 23.3 pg — ABNORMAL LOW (ref 26.0–34.0)
MCHC: 32.6 g/dL (ref 30.0–36.0)
MCV: 71.4 fL — ABNORMAL LOW (ref 80.0–100.0)
Monocytes Absolute: 2.5 K/uL — ABNORMAL HIGH (ref 0.1–1.0)
Monocytes Relative: 12 %
Neutro Abs: 15.4 K/uL — ABNORMAL HIGH (ref 1.7–7.7)
Neutrophils Relative %: 77 %
Platelets: 190 K/uL (ref 150–400)
RBC: 4.76 MIL/uL (ref 3.87–5.11)
RDW: 15.8 % — ABNORMAL HIGH (ref 11.5–15.5)
WBC: 20.1 K/uL — ABNORMAL HIGH (ref 4.0–10.5)
nRBC: 0 % (ref 0.0–0.2)

## 2024-04-14 LAB — URINALYSIS, W/ REFLEX TO CULTURE (INFECTION SUSPECTED)
Bilirubin Urine: NEGATIVE
Glucose, UA: NEGATIVE mg/dL
Ketones, ur: 5 mg/dL — AB
Nitrite: NEGATIVE
Protein, ur: 100 mg/dL — AB
RBC / HPF: >50 RBC/hpf (ref 0–5)
Specific Gravity, Urine: 1.017 (ref 1.005–1.030)
WBC, UA: >50 WBC/hpf (ref 0–5)
pH: 5 (ref 5.0–8.0)

## 2024-04-14 LAB — COMPREHENSIVE METABOLIC PANEL WITH GFR
ALT: 28 U/L (ref 0–44)
AST: 51 U/L — ABNORMAL HIGH (ref 15–41)
Albumin: 3.7 g/dL (ref 3.5–5.0)
Alkaline Phosphatase: 183 U/L — ABNORMAL HIGH (ref 38–126)
Anion gap: 18 — ABNORMAL HIGH (ref 5–15)
BUN: 29 mg/dL — ABNORMAL HIGH (ref 8–23)
CO2: 20 mmol/L — ABNORMAL LOW (ref 22–32)
Calcium: 9.6 mg/dL (ref 8.9–10.3)
Chloride: 99 mmol/L (ref 98–111)
Creatinine, Ser: 1.66 mg/dL — ABNORMAL HIGH (ref 0.44–1.00)
GFR, Estimated: 29 mL/min — ABNORMAL LOW (ref >60)
Glucose, Bld: 103 mg/dL — ABNORMAL HIGH (ref 70–99)
Potassium: 3.9 mmol/L (ref 3.5–5.1)
Sodium: 137 mmol/L (ref 135–145)
Total Bilirubin: 1.3 mg/dL — ABNORMAL HIGH (ref 0.0–1.2)
Total Protein: 7.7 g/dL (ref 6.5–8.1)

## 2024-04-14 LAB — RESP PANEL BY RT-PCR (RSV, FLU A&B, COVID)  RVPGX2
Influenza A by PCR: NEGATIVE
Influenza B by PCR: NEGATIVE
Resp Syncytial Virus by PCR: NEGATIVE
SARS Coronavirus 2 by RT PCR: NEGATIVE

## 2024-04-14 LAB — I-STAT CG4 LACTIC ACID, ED: Lactic Acid, Venous: 1.5 mmol/L (ref 0.5–1.9)

## 2024-04-14 MED ORDER — ONDANSETRON HCL 4 MG PO TABS: 4.0000 mg | ORAL_TABLET | Freq: Four times a day (QID) | ORAL | Status: DC | PRN

## 2024-04-14 MED ORDER — VANCOMYCIN HCL IN DEXTROSE 1-5 GM/200ML-% IV SOLN: 1000.0000 mg | Freq: Once | INTRAVENOUS | Status: DC

## 2024-04-14 MED ORDER — LACTATED RINGERS IV SOLN: INTRAVENOUS | Status: DC

## 2024-04-14 MED ORDER — HALOPERIDOL LACTATE 5 MG/ML IJ SOLN
2.0000 mg | Freq: Four times a day (QID) | INTRAMUSCULAR | Status: DC | PRN
  Administered 2024-04-15 (×3): 2 mg via INTRAVENOUS
  Filled 2024-04-14 (×2): qty 1

## 2024-04-14 MED ORDER — ACETAMINOPHEN 325 MG PO TABS
650.0000 mg | ORAL_TABLET | Freq: Four times a day (QID) | ORAL | Status: DC | PRN
  Administered 2024-04-14: 650 mg via ORAL
  Filled 2024-04-14: qty 2

## 2024-04-14 MED ORDER — HEPARIN SODIUM (PORCINE) 5000 UNIT/ML IJ SOLN
5000.0000 [IU] | Freq: Three times a day (TID) | INTRAMUSCULAR | Status: DC
  Administered 2024-04-14 – 2024-04-19 (×15): 5000 [IU] via SUBCUTANEOUS
  Filled 2024-04-14 (×15): qty 1

## 2024-04-14 MED ORDER — ACETAMINOPHEN 650 MG RE SUPP
650.0000 mg | Freq: Four times a day (QID) | RECTAL | Status: DC | PRN
  Filled 2024-04-14: qty 1

## 2024-04-14 MED ORDER — HALOPERIDOL LACTATE 5 MG/ML IJ SOLN
2.0000 mg | Freq: Once | INTRAMUSCULAR | Status: AC
  Administered 2024-04-14: 2 mg via INTRAVENOUS
  Filled 2024-04-14: qty 1

## 2024-04-14 MED ORDER — ASPIRIN 81 MG PO TBEC
81.0000 mg | DELAYED_RELEASE_TABLET | Freq: Every day | ORAL | Status: DC
  Administered 2024-04-14 – 2024-04-19 (×6): 81 mg via ORAL
  Filled 2024-04-14 (×6): qty 1

## 2024-04-14 MED ORDER — LACTATED RINGERS IV BOLUS
500.0000 mL | Freq: Once | INTRAVENOUS | Status: AC
  Administered 2024-04-14: 500 mL via INTRAVENOUS

## 2024-04-14 MED ORDER — LACTATED RINGERS IV BOLUS (SEPSIS)
1000.0000 mL | Freq: Once | INTRAVENOUS | Status: AC
  Administered 2024-04-14: 1000 mL via INTRAVENOUS

## 2024-04-14 MED ORDER — AZTREONAM 2 G IJ SOLR
2.0000 g | Freq: Once | INTRAMUSCULAR | Status: AC
  Administered 2024-04-14: 2 g via INTRAVENOUS
  Filled 2024-04-14: qty 10

## 2024-04-14 MED ORDER — SODIUM CHLORIDE 0.9 % IV SOLN
1.0000 g | INTRAVENOUS | Status: DC
  Administered 2024-04-14: 1 g via INTRAVENOUS
  Filled 2024-04-14 (×2): qty 10

## 2024-04-14 MED ORDER — GUAIFENESIN-DM 100-10 MG/5ML PO SYRP
5.0000 mL | ORAL_SOLUTION | ORAL | Status: DC | PRN
  Administered 2024-04-14 – 2024-04-15 (×2): 5 mL via ORAL
  Filled 2024-04-14 (×2): qty 10

## 2024-04-14 MED ORDER — AMLODIPINE BESYLATE 5 MG PO TABS
5.0000 mg | ORAL_TABLET | Freq: Every day | ORAL | Status: DC
  Administered 2024-04-14 – 2024-04-19 (×6): 5 mg via ORAL
  Filled 2024-04-14 (×6): qty 1

## 2024-04-14 MED ORDER — SIMVASTATIN 20 MG PO TABS
20.0000 mg | ORAL_TABLET | Freq: Every evening | ORAL | Status: DC
  Administered 2024-04-14 – 2024-04-18 (×5): 20 mg via ORAL
  Filled 2024-04-14 (×5): qty 1

## 2024-04-14 MED ORDER — ONDANSETRON HCL 4 MG/2ML IJ SOLN
4.0000 mg | Freq: Four times a day (QID) | INTRAMUSCULAR | Status: DC | PRN
  Administered 2024-04-18: 4 mg via INTRAVENOUS
  Filled 2024-04-14: qty 2

## 2024-04-14 MED ORDER — LEVOTHYROXINE SODIUM 75 MCG PO TABS
75.0000 ug | ORAL_TABLET | Freq: Every day | ORAL | Status: DC
Start: 2024-04-15 — End: 2024-04-19
  Administered 2024-04-15 – 2024-04-19 (×5): 75 ug via ORAL
  Filled 2024-04-14 (×5): qty 1

## 2024-04-14 MED ORDER — METRONIDAZOLE 500 MG/100ML IV SOLN
500.0000 mg | Freq: Once | INTRAVENOUS | Status: AC
  Administered 2024-04-14: 500 mg via INTRAVENOUS
  Filled 2024-04-14: qty 100

## 2024-04-14 MED ORDER — LACTATED RINGERS IV SOLN: INTRAVENOUS | Status: AC

## 2024-04-14 MED ORDER — SODIUM CHLORIDE 0.9 % IV SOLN
2.0000 g | INTRAVENOUS | Status: DC
  Administered 2024-04-15 – 2024-04-16 (×2): 2 g via INTRAVENOUS
  Filled 2024-04-14 (×2): qty 20

## 2024-04-14 MED ORDER — ACETAMINOPHEN 500 MG PO TABS
1000.0000 mg | ORAL_TABLET | Freq: Once | ORAL | Status: AC
  Administered 2024-04-14: 1000 mg via ORAL
  Filled 2024-04-14: qty 2

## 2024-04-14 NOTE — ED Notes (Signed)
 Attempted second IV access without success. 2nd cultures unable to be attained at this time. Antibiotics and fluid not delayed.

## 2024-04-14 NOTE — Progress Notes (Signed)
 PHARMACY - PHYSICIAN COMMUNICATION CRITICAL VALUE ALERT - BLOOD CULTURE IDENTIFICATION (BCID)  Jordan [name] is an 89 y.o. female who presented to [facility] on 04/14/2024 with a chief complaint of recurrent falls  Assessment:  2/4 Ecoli, no R  Name of physician (or Provider) Contacted: Blondie  Current antibiotics: CTX 1 gm  Changes to prescribed antibiotics recommended:  Change to CTX 2gm  Results for orders placed or performed during the hospital encounter of 04/14/24  Blood Culture ID Panel (Reflexed) (Collected: 04/14/2024  4:17 AM)  Result Value Ref Range   Enterococcus faecalis NOT DETECTED NOT DETECTED   Enterococcus Faecium NOT DETECTED NOT DETECTED   Listeria monocytogenes NOT DETECTED NOT DETECTED   Staphylococcus species NOT DETECTED NOT DETECTED   Staphylococcus aureus (BCID) NOT DETECTED NOT DETECTED   Staphylococcus epidermidis NOT DETECTED NOT DETECTED   Staphylococcus lugdunensis NOT DETECTED NOT DETECTED   Streptococcus species NOT DETECTED NOT DETECTED   Streptococcus agalactiae NOT DETECTED NOT DETECTED   Streptococcus pneumoniae NOT DETECTED NOT DETECTED   Streptococcus pyogenes NOT DETECTED NOT DETECTED   A.calcoaceticus-baumannii NOT DETECTED NOT DETECTED   Bacteroides fragilis NOT DETECTED NOT DETECTED   Enterobacterales DETECTED (A) NOT DETECTED   Enterobacter cloacae complex NOT DETECTED NOT DETECTED   Escherichia coli DETECTED (A) NOT DETECTED   Klebsiella aerogenes NOT DETECTED NOT DETECTED   Klebsiella oxytoca NOT DETECTED NOT DETECTED   Klebsiella pneumoniae NOT DETECTED NOT DETECTED   Proteus species NOT DETECTED NOT DETECTED   Salmonella species NOT DETECTED NOT DETECTED   Serratia marcescens NOT DETECTED NOT DETECTED   Haemophilus influenzae NOT DETECTED NOT DETECTED   Neisseria meningitidis NOT DETECTED NOT DETECTED   Pseudomonas aeruginosa NOT DETECTED NOT DETECTED   Stenotrophomonas maltophilia NOT DETECTED NOT DETECTED   Candida  albicans NOT DETECTED NOT DETECTED   Candida auris NOT DETECTED NOT DETECTED   Candida glabrata NOT DETECTED NOT DETECTED   Candida krusei NOT DETECTED NOT DETECTED   Candida parapsilosis NOT DETECTED NOT DETECTED   Candida tropicalis NOT DETECTED NOT DETECTED   Cryptococcus neoformans/gattii NOT DETECTED NOT DETECTED   CTX-M ESBL NOT DETECTED NOT DETECTED   Carbapenem resistance IMP NOT DETECTED NOT DETECTED   Carbapenem resistance KPC NOT DETECTED NOT DETECTED   Carbapenem resistance NDM NOT DETECTED NOT DETECTED   Carbapenem resist OXA 48 LIKE NOT DETECTED NOT DETECTED   Carbapenem resistance VIM NOT DETECTED NOT DETECTED    Leeroy Mace RPh 04/14/2024, 10:46 PM

## 2024-04-14 NOTE — ED Notes (Signed)
 Attempted to place ultrasound IV. Pt repeatedly stating 'you're not gonna stick me. I don't know you'. Also asking the time every other minute.

## 2024-04-14 NOTE — Sepsis Progress Note (Signed)
 Elink following code sepsis

## 2024-04-14 NOTE — ED Provider Notes (Signed)
 Patient is now refusing interventions.  She is AO1.  She does not know where she is or why she is here.  She is refusing to give urine or to allow second IV.  She keeps asking for her phone.  She did not come here with a phone. I have tried to redirect the patient but we have hit an impasse,  She will receive 2 mg of haldol  so we can proceed with additional IV access and additional care.      Skyllar Notarianni, MD 04/14/24 [phone]

## 2024-04-14 NOTE — ED Provider Notes (Signed)
 [place] EMERGENCY DEPARTMENT AT [facility] Provider Note   CSN: [id number] Arrival date & time: 04/14/24  0153     Patient presents with: Jordan   Trenice [name] is a 89 y.o. female.   The history is provided by the patient, a relative and the EMS personnel.  Fall This is a new problem. The current episode started less than 1 hour ago. The problem occurs rarely. The problem has not changed since onset.Pertinent negatives include no chest pain, no abdominal pain, no headaches and no shortness of breath. Associated symptoms comments: cough. Nothing aggravates the symptoms. Nothing relieves the symptoms. She has tried nothing for the symptoms. The treatment provided no relief.  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 Hit head, no LOC.  Also has a cough and fever.       Prior to Admission medications   Medication Sig Start Date End Date Taking? Authorizing Provider  albuterol  (VENTOLIN  HFA) 108 (90 Base) MCG/ACT inhaler Inhale 1 puff into the lungs every 6 (six) hours as needed for wheezing or shortness of breath. 06/03/19   Moore, [name], FNP  amLODipine  (NORVASC ) 5 MG tablet TAKE 1 TABLET BY MOUTH EVERY DAY 06/30/22   Georgina Speaks, FNP  aspirin  EC 81 MG tablet Take 81 mg by mouth daily. Swallow whole.    [provider]  atropine  1 % ophthalmic solution Place 1 drop into the left eye daily. 03/11/21   [provider]  escitalopram  (LEXAPRO ) 10 MG tablet Take 1 tablet (10 mg total) by mouth daily. 01/26/22 01/26/23  Georgina Speaks, FNP  etanercept  (ENBREL  SURECLICK) 50 MG/ML injection Inject 50 mg into the skin once a week. 03/23/22   Cheryl Waddell HERO, PA-C  feeding supplement, ENSURE ENLIVE, (ENSURE ENLIVE) LIQD Take 237 mLs by mouth daily in the afternoon. 03/26/20   Briana Elgin LABOR, MD  ferrous sulfate  325 (65 FE) MG tablet Take 325 mg by mouth daily with breakfast.    [provider]  fluticasone (FLONASE) 50 MCG/ACT  nasal spray INSTILL 2 SPRAYS IN EACH NOSTRIL EVERY DAY Patient taking differently: Place 2 sprays into both nostrils daily as needed for allergies. 06/12/18   Moore, [name], FNP  folic acid  (FOLVITE ) 1 MG tablet TAKE 1 TABLET(1 MG) BY MOUTH DAILY Patient taking differently: Take 1 mg by mouth daily. 07/12/18   Dolphus Reiter, MD  furosemide  (LASIX ) 20 MG tablet Take 1 tablet (20 mg total) by mouth daily for 7 days. 03/15/22 03/22/22  Piontek, Rocky, MD  latanoprost (XALATAN) 0.005 % ophthalmic solution Place 1 drop into both eyes at bedtime.  10/24/16   [provider]  loratadine  (CLARITIN ) 10 MG tablet TAKE 1 TABLET BY MOUTH EVERY DAY 06/30/22   Georgina Speaks, FNP  NAMZARIC  28-10 MG CP24 TAKE 1 CAPSULE BY MOUTH EVERY DAY 02/23/22   Georgina Speaks, FNP  omeprazole  (PRILOSEC) 20 MG capsule TAKE 1 CAPSULE BY MOUTH EVERY DAY 03/04/22   Moore, [name], FNP  predniSONE  (DELTASONE ) 5 MG tablet Take 2 tablets by mouth daily x1 wk, 1.5 tablets daily x1 wk, 1 tablet daily x1wk, half tablet daily x1wk. Patient not taking: Reported on 04/21/2022 10/13/21   Cheryl Waddell HERO, PA-C  QUEtiapine  (SEROQUEL ) 100 MG tablet TAKE 1 TABLET BY MOUTH AT BEDTIME 02/23/22   Georgina Speaks, FNP  simvastatin  (ZOCOR ) 20 MG tablet TAKE 1 TABLET BY MOUTH EVERY EVENING 02/06/23   Georgina Speaks, FNP  SYNTHROID  75 MCG tablet TAKE  1 TABLET BY MOUTH EVERY DAY BEFORE BREAKFAST 06/18/22   Georgina Speaks, FNP  timolol  (TIMOPTIC ) 0.5 % ophthalmic solution 1 drop 2 (two) times daily. 06/17/20   [provider]  Vitamin D , Ergocalciferol , (DRISDOL ) 1.25 MG (50000 UNIT) CAPS capsule TAKE 1 CAPSULE BY MOUTH TWICE A WEEK 06/02/22   Georgina Speaks, FNP    Allergies: Penicillins    Review of Systems  Unable to perform ROS: Acuity of condition  Constitutional:  Positive for fever.  Respiratory:  Positive for cough. Negative for shortness of breath.   Cardiovascular:  Negative for chest pain.  Gastrointestinal:  Negative for abdominal  pain.  Neurological:  Negative for headaches.    Updated Vital Signs BP 135/76 (BP Location: Left Arm)   Pulse (!) 119   Temp 99.4 F (37.4 C) (Oral)   Resp 20   SpO2 97%   Physical Exam Vitals and nursing note reviewed.  Constitutional:      General: She is not in acute distress.    Appearance: Normal appearance. She is well-developed.  HENT:     Head: Normocephalic and atraumatic.     Right Ear: Tympanic membrane normal.     Left Ear: Tympanic membrane normal.     Nose: Nose normal.  Eyes:     Comments: Clouded corneas  Cardiovascular:     Rate and Rhythm: Regular rhythm. Tachycardia present.     Pulses: Normal pulses.     Heart sounds: Normal heart sounds.  Pulmonary:     Effort: Pulmonary effort is normal. No respiratory distress.     Breath sounds: Normal breath sounds. No wheezing or rales.  Abdominal:     General: Bowel sounds are normal. There is no distension.     Palpations: Abdomen is soft.     Tenderness: There is no abdominal tenderness. There is no guarding or rebound.  Musculoskeletal:        General: Normal range of motion.     Cervical back: Normal range of motion and neck supple. No rigidity.  Skin:    General: Skin is warm and dry.     Capillary Refill: Capillary refill takes less than 2 seconds.     Findings: No erythema or rash.  Neurological:     General: No focal deficit present.     Mental Status: She is alert.     Deep Tendon Reflexes: Reflexes normal.  Psychiatric:        Mood and Affect: Mood normal.     (all labs ordered are listed, but only abnormal results are displayed) Results for orders placed or performed during the hospital encounter of 04/14/24  Resp panel by RT-PCR (RSV, Flu A&B, Covid) Anterior Nasal Swab   Collection Time: 04/14/24  2:23 AM   Specimen: Anterior Nasal Swab  Result Value Ref Range   SARS Coronavirus 2 by RT PCR NEGATIVE NEGATIVE   Influenza A by PCR NEGATIVE NEGATIVE   Influenza B by PCR NEGATIVE NEGATIVE    Resp Syncytial Virus by PCR NEGATIVE NEGATIVE  Comprehensive metabolic panel   Collection Time: 04/14/24  4:15 AM  Result Value Ref Range   Sodium 137 135 - 145 mmol/L   Potassium 3.9 3.5 - 5.1 mmol/L   Chloride 99 98 - 111 mmol/L   CO2 20 (L) 22 - 32 mmol/L   Glucose, Bld 103 (H) 70 - 99 mg/dL   BUN 29 (H) 8 - 23 mg/dL   Creatinine, Ser 8.33 (H) 0.44 - 1.00 mg/dL  Calcium 9.6 8.9 - 10.3 mg/dL   Total Protein 7.7 6.5 - 8.1 g/dL   Albumin 3.7 3.5 - 5.0 g/dL   AST 51 (H) 15 - 41 U/L   ALT 28 0 - 44 U/L   Alkaline Phosphatase 183 (H) 38 - 126 U/L   Total Bilirubin 1.3 (H) 0.0 - 1.2 mg/dL   GFR, Estimated 29 (L) >60 mL/min   Anion gap 18 (H) 5 - 15  CBC with Differential   Collection Time: 04/14/24  4:15 AM  Result Value Ref Range   WBC 20.1 (H) 4.0 - 10.5 K/uL   RBC 4.76 3.87 - 5.11 MIL/uL   Hemoglobin 11.1 (L) 12.0 - 15.0 g/dL   HCT 65.9 (L) 63.9 - 53.9 %   MCV 71.4 (L) 80.0 - 100.0 fL   MCH 23.3 (L) 26.0 - 34.0 pg   MCHC 32.6 30.0 - 36.0 g/dL   RDW 84.1 (H) 88.4 - 84.4 %   Platelets 190 150 - 400 K/uL   nRBC 0.0 0.0 - 0.2 %   Neutrophils Relative % 77 %   Neutro Abs 15.4 (H) 1.7 - 7.7 K/uL   Lymphocytes Relative 10 %   Lymphs Abs 2.0 0.7 - 4.0 K/uL   Monocytes Relative 12 %   Monocytes Absolute 2.5 (H) 0.1 - 1.0 K/uL   Eosinophils Relative 0 %   Eosinophils Absolute 0.0 0.0 - 0.5 K/uL   Basophils Relative 0 %   Basophils Absolute 0.0 0.0 - 0.1 K/uL   Immature Granulocytes 1 %   Abs Immature Granulocytes 0.14 (H) 0.00 - 0.07 K/uL  I-Stat Lactic Acid, ED   Collection Time: 04/14/24  4:17 AM  Result Value Ref Range   Lactic Acid, Venous 1.5 0.5 - 1.9 mmol/L   DG Chest Port 1 View Result Date: 04/14/2024 EXAM: 1 VIEW XRAY OF THE CHEST 04/14/2024 03:43:00 AM COMPARISON: 03/24/2020 CLINICAL HISTORY: Questionable sepsis - evaluate for abnormality. Table formatting from the original note was not included. Images from the original note were not included. BIBA from Home,  Fall at homec/o neck pain and stiffness, Left hip pain on palpitation. No Thinners. Hx blindness, dementia, anxiety. FINDINGS: LUNGS AND PLEURA: No focal pulmonary opacity. No pulmonary edema. No pleural effusion. No pneumothorax. HEART AND MEDIASTINUM: Cardiomegaly. BONES AND SOFT TISSUES: Old left upper rib fracture deformities. No acute osseous abnormality. IMPRESSION: 1. No acute cardiopulmonary abnormality. 2. Mild cardiomegaly. Electronically signed by: Pinkie Pebbles MD 04/14/2024 03:46 AM EDT RP Workstation: [id number]   CT Head Wo Contrast Result Date: 04/14/2024 EXAM: CT HEAD AND CERVICAL SPINE 04/14/2024 03:09:24 AM TECHNIQUE: CT of the head and cervical spine was performed without the administration of intravenous contrast. Multiplanar reformatted images are provided for review. Automated exposure control, iterative reconstruction, and/or weight based adjustment of the mA/kV was utilized to reduce the radiation dose to as low as reasonably achievable. COMPARISON: None available. CLINICAL HISTORY: Syncope/presyncope, cerebrovascular cause suspected. FINDINGS: CT HEAD BRAIN AND VENTRICLES: No acute intracranial hemorrhage. No mass effect or midline shift. No abnormal extra-axial fluid collection. No evidence of acute infarct. No hydrocephalus. ORBITS: No acute abnormality. SINUSES AND MASTOIDS: No acute abnormality. SOFT TISSUES AND SKULL: No acute skull fracture. No acute soft tissue abnormality. CT CERVICAL SPINE BONES AND ALIGNMENT: No acute fracture or traumatic malalignment. DEGENERATIVE CHANGES: Multilevel cervical degenerative disc disease. No high-grade spinal canal stenosis. SOFT TISSUES: No prevertebral soft tissue swelling. IMPRESSION: 1. No acute intracranial abnormality. 2. No acute abnormality of the cervical spine. Electronically  signed by: Franky Stanford MD 04/14/2024 03:16 AM EDT RP Workstation: [id number]   CT Cervical Spine Wo Contrast Result Date: 04/14/2024 EXAM: CT HEAD AND  CERVICAL SPINE 04/14/2024 03:09:24 AM TECHNIQUE: CT of the head and cervical spine was performed without the administration of intravenous contrast. Multiplanar reformatted images are provided for review. Automated exposure control, iterative reconstruction, and/or weight based adjustment of the mA/kV was utilized to reduce the radiation dose to as low as reasonably achievable. COMPARISON: None available. CLINICAL HISTORY: Syncope/presyncope, cerebrovascular cause suspected. FINDINGS: CT HEAD BRAIN AND VENTRICLES: No acute intracranial hemorrhage. No mass effect or midline shift. No abnormal extra-axial fluid collection. No evidence of acute infarct. No hydrocephalus. ORBITS: No acute abnormality. SINUSES AND MASTOIDS: No acute abnormality. SOFT TISSUES AND SKULL: No acute skull fracture. No acute soft tissue abnormality. CT CERVICAL SPINE BONES AND ALIGNMENT: No acute fracture or traumatic malalignment. DEGENERATIVE CHANGES: Multilevel cervical degenerative disc disease. No high-grade spinal canal stenosis. SOFT TISSUES: No prevertebral soft tissue swelling. IMPRESSION: 1. No acute intracranial abnormality. 2. No acute abnormality of the cervical spine. Electronically signed by: Franky Stanford MD 04/14/2024 03:16 AM EDT RP Workstation: [id number]    EKG:  EKG Interpretation Date/Time:  Sunday April 14 2024 04:27:44 EDT Ventricular Rate:  120 PR Interval:  117 QRS Duration:  65 QT Interval:  310 QTC Calculation: 438 R Axis:   66  Text Interpretation: Sinus tachycardia Low voltage, precordial leads Confirmed by Nettie, Mariadel Mruk ([id number]) on 04/14/2024 6:00:14 AM        Radiology: [facility] Chest Port 1 View Result Date: 04/14/2024 EXAM: 1 VIEW XRAY OF THE CHEST 04/14/2024 03:43:00 AM COMPARISON: 03/24/2020 CLINICAL HISTORY: Questionable sepsis - evaluate for abnormality. Table formatting from the original note was not included. Images from the original note were not included. BIBA from Home, Fall at  homec/o neck pain and stiffness, Left hip pain on palpitation. No Thinners. Hx blindness, dementia, anxiety. FINDINGS: LUNGS AND PLEURA: No focal pulmonary opacity. No pulmonary edema. No pleural effusion. No pneumothorax. HEART AND MEDIASTINUM: Cardiomegaly. BONES AND SOFT TISSUES: Old left upper rib fracture deformities. No acute osseous abnormality. IMPRESSION: 1. No acute cardiopulmonary abnormality. 2. Mild cardiomegaly. Electronically signed by: Pinkie Pebbles MD 04/14/2024 03:46 AM EDT RP Workstation: [id number]   CT Head Wo Contrast Result Date: 04/14/2024 EXAM: CT HEAD AND CERVICAL SPINE 04/14/2024 03:09:24 AM TECHNIQUE: CT of the head and cervical spine was performed without the administration of intravenous contrast. Multiplanar reformatted images are provided for review. Automated exposure control, iterative reconstruction, and/or weight based adjustment of the mA/kV was utilized to reduce the radiation dose to as low as reasonably achievable. COMPARISON: None available. CLINICAL HISTORY: Syncope/presyncope, cerebrovascular cause suspected. FINDINGS: CT HEAD BRAIN AND VENTRICLES: No acute intracranial hemorrhage. No mass effect or midline shift. No abnormal extra-axial fluid collection. No evidence of acute infarct. No hydrocephalus. ORBITS: No acute abnormality. SINUSES AND MASTOIDS: No acute abnormality. SOFT TISSUES AND SKULL: No acute skull fracture. No acute soft tissue abnormality. CT CERVICAL SPINE BONES AND ALIGNMENT: No acute fracture or traumatic malalignment. DEGENERATIVE CHANGES: Multilevel cervical degenerative disc disease. No high-grade spinal canal stenosis. SOFT TISSUES: No prevertebral soft tissue swelling. IMPRESSION: 1. No acute intracranial abnormality. 2. No acute abnormality of the cervical spine. Electronically signed by: Franky Stanford MD 04/14/2024 03:16 AM EDT RP Workstation: [id number]   CT Cervical Spine Wo Contrast Result Date: 04/14/2024 EXAM: CT HEAD AND CERVICAL  SPINE 04/14/2024 03:09:24 AM TECHNIQUE: CT of the head and cervical spine was performed  without the administration of intravenous contrast. Multiplanar reformatted images are provided for review. Automated exposure control, iterative reconstruction, and/or weight based adjustment of the mA/kV was utilized to reduce the radiation dose to as low as reasonably achievable. COMPARISON: None available. CLINICAL HISTORY: Syncope/presyncope, cerebrovascular cause suspected. FINDINGS: CT HEAD BRAIN AND VENTRICLES: No acute intracranial hemorrhage. No mass effect or midline shift. No abnormal extra-axial fluid collection. No evidence of acute infarct. No hydrocephalus. ORBITS: No acute abnormality. SINUSES AND MASTOIDS: No acute abnormality. SOFT TISSUES AND SKULL: No acute skull fracture. No acute soft tissue abnormality. CT CERVICAL SPINE BONES AND ALIGNMENT: No acute fracture or traumatic malalignment. DEGENERATIVE CHANGES: Multilevel cervical degenerative disc disease. No high-grade spinal canal stenosis. SOFT TISSUES: No prevertebral soft tissue swelling. IMPRESSION: 1. No acute intracranial abnormality. 2. No acute abnormality of the cervical spine. Electronically signed by: Franky Stanford MD 04/14/2024 03:16 AM EDT RP Workstation: [id number]     .Critical Care  Performed by: Nettie Earing, MD Authorized by: Nettie Earing, MD   Critical care provider statement:    Critical care time (minutes):  60   Critical care end time:  04/14/2024 6:11 AM   Critical care was necessary to treat or prevent imminent or life-threatening deterioration of the following conditions:  Sepsis   Critical care was time spent personally by me on the following activities:  Development of treatment plan with patient or surrogate, discussions with consultants, evaluation of patient's response to treatment, examination of patient, ordering and review of laboratory studies, ordering and review of radiographic studies, ordering and  performing treatments and interventions, pulse oximetry, re-evaluation of patient's condition and review of old charts   I assumed direction of critical care for this patient from another provider in my specialty: no     Care discussed with: admitting provider     Medications  lactated ringers  infusion (has no administration in time range)  metroNIDAZOLE  (FLAGYL ) IVPB 500 mg (500 mg Intravenous New Bag/Given 04/14/24 0536)  vancomycin  (VANCOCIN ) IVPB 1000 mg/200 mL premix (has no administration in time range)  lactated ringers  bolus 500 mL (has no administration in time range)  acetaminophen  (TYLENOL ) tablet 1,000 mg (has no administration in time range)  acetaminophen  (TYLENOL ) tablet 650 mg (has no administration in time range)    Or  acetaminophen  (TYLENOL ) suppository 650 mg (has no administration in time range)  lactated ringers  bolus 1,000 mL (1,000 mLs Intravenous New Bag/Given 04/14/24 0454)  aztreonam  (AZACTAM ) 2 g in sodium chloride  0.9 % 100 mL IVPB (2 g Intravenous New Bag/Given 04/14/24 0453)                                     Medical Decision Making Patient here for unwitnessed fall at home   Amount and/or Complexity of Data Reviewed Independent Historian: EMS    Details: See above  External Data Reviewed: labs and notes.    Details: Previous notes and labs reviewed Baseline creatine 1.06 Labs: ordered.    Details: Elevated white count 20.1, low hemoglobin 11.1, normal platelets. Negative covid and flu.  Lactate 1.5,  normal sodium 137, normsl potassium 3.9, elevated creatinine 1.66 Radiology: ordered and independent interpretation performed.    Details: Negative CT head, negative CXR ECG/medicine tests: ordered and independent interpretation performed. Decision-making details documented in ED Course.  Risk OTC drugs. Prescription drug management. Decision regarding hospitalization.    Final diagnoses:  None   The patient  appears reasonably stabilized for  admission considering the current resources, flow, and capabilities available in the ED at this time, and I doubt any other [facility] requiring further screening and/or treatment in the ED prior to admission.  ED Discharge Orders     None          Girtie Wiersma, MD 04/14/24 [id number]

## 2024-04-14 NOTE — ED Notes (Signed)
 PT refuses to provide a urine sample

## 2024-04-14 NOTE — Progress Notes (Signed)
  Carryover admission to the Day Admitter.  I discussed this case with the EDP, Dr. Palumbo.  Per these discussions:   This is a 89 year old female who is legally blind in both eyes, who lives by herself, who is being admitted with SIRS grade 2 without source of infection, as well as acute kidney injury, after presenting for evaluation of ground-level mechanical fall.  Patient reports that she tripped while attempting ambulate at home, without any loss of consciousness.  EMS subsequently brought patient to the ED, where she was found to have temperature max 101, with heart rates initially in the 130s, associate with sinus tachycardia, with ensuing decrease in heart rate into the 1 teens following interval IV fluids.  Systolic pressures in the 130s to 140s.  COVID, influenza, RSV PCR were all negative.  Urinalysis is pending.  Chest x-ray showed no evidence of acute cardiopulmonary process.  Presenting labs were also notable for acute kidney injury.  Lactic acid nonelevated.  Blood cultures x 2 were collected prior to initiation of broad-spectrum undifferentiated IV antibiotics that included IV vancomycin , aztreonam , IV Flagyl .  I have placed an order for  observation for further evaluation and management of the above.  I have placed some additional preliminary admit orders via the adult multi-morbid admission order set. I have continued existing order for lactated Ringer 's at 150 cc/h, and ordered fall precautions.  I will defer decision making regarding additional antibiotics to the admitting hospitalist.    Eva Pore, DO Hospitalist

## 2024-04-14 NOTE — ED Triage Notes (Signed)
 BIBA from Home, Fall at homec/o neck pain and stiffness, Left hip pain on palpitation No Thinners Hx blindness, dementia, anxiety  140/70 P 119 T 101 CBG 141

## 2024-04-14 NOTE — H&P (Signed)
 History and Physical    Jordan [name] FMW:[id number] DOB: [birth year] DOA: 04/14/2024  PCP: [name], NP  Patient coming from: Home, lives alone and has 12-hour day caregivers and family to help  Chief Complaint: Recurrent falls   HPI: Jordan [name] is a 89 y.o. female with medical history significant of dementia, anxiety, HTN, HLD who presents after recurrent falls at home.  History is gathered from son and daughter at bedside.  They report that patient fell on Friday, EMS was called and checked her out at home.  Then patient fell again on Friday.  At that time, EMS found that patient was incoherent and was transferred to the emergency department.  Per family, patient has short-term memory loss and asks questions repeatedly.  She lives at home alone and has caregivers and family that helps her.  Family does not know of any recent illnesses or complaints.  Patient has not had any nausea or vomiting to their knowledge.  ED Course: Patient found to have fever 101, tachycardic.  WBC 20.1, creatinine 1.66.  CT head was unremarkable, chest x-ray negative for pneumonia.  UA and blood cultures ordered.  Patient given broad-spectrum IV antibiotics.  Review of Systems: As per HPI. Otherwise, all other review of systems reviewed and are negative.   Past Medical History:  Diagnosis Date   Allergy    Anxiety    per pt's daughter   Arthritis    Asthma    pt reported   Depression    GERD (gastroesophageal reflux disease)    Hearing deficit L   High cholesterol    Hypertension    Osteoporosis    Rheumatoid arthritis (HCC)    Stroke [facility])     Past Surgical History:  Procedure Laterality Date   ABDOMINAL HYSTERECTOMY     KNEE ARTHROPLASTY     REPLACEMENT TOTAL KNEE BILATERAL Bilateral    WRIST SURGERY Left      reports that she quit smoking about 17 years ago. Her smoking use included cigarettes. She started smoking about 30 years ago. She has a 3.3 pack-year smoking history.  She has never been exposed to tobacco smoke. She has never used smokeless tobacco. She reports that she does not currently use drugs after having used the following drugs: Hydrocodone . She reports that she does not drink alcohol.  Allergies  Allergen Reactions   Penicillins Hives    History reviewed. No pertinent family history.  Prior to Admission medications   Medication Sig Start Date End Date Taking? Authorizing Provider  albuterol  (VENTOLIN  HFA) 108 (90 Base) MCG/ACT inhaler Inhale 1 puff into the lungs every 6 (six) hours as needed for wheezing or shortness of breath. 06/03/19   Moore, [name], FNP  amLODipine  (NORVASC ) 5 MG tablet TAKE 1 TABLET BY MOUTH EVERY DAY 06/30/22   Georgina Speaks, FNP  aspirin  EC 81 MG tablet Take 81 mg by mouth daily. Swallow whole.    [provider]  atropine  1 % ophthalmic solution Place 1 drop into the left eye daily. 03/11/21   [provider]  escitalopram  (LEXAPRO ) 10 MG tablet Take 1 tablet (10 mg total) by mouth daily. 01/26/22 01/26/23  Georgina Speaks, FNP  etanercept  (ENBREL  SURECLICK) 50 MG/ML injection Inject 50 mg into the skin once a week. 03/23/22   Cheryl Waddell HERO, PA-C  feeding supplement, ENSURE ENLIVE, (ENSURE ENLIVE) LIQD Take 237 mLs by mouth daily in the afternoon. 03/26/20   Briana Elgin LABOR, MD  ferrous sulfate  325 (65 FE)  MG tablet Take 325 mg by mouth daily with breakfast.    [provider]  fluticasone (FLONASE) 50 MCG/ACT nasal spray INSTILL 2 SPRAYS IN EACH NOSTRIL EVERY DAY Patient taking differently: Place 2 sprays into both nostrils daily as needed for allergies. 06/12/18   Georgina Speaks, FNP  folic acid  (FOLVITE ) 1 MG tablet TAKE 1 TABLET(1 MG) BY MOUTH DAILY Patient taking differently: Take 1 mg by mouth daily. 07/12/18   Dolphus Reiter, MD  furosemide  (LASIX ) 20 MG tablet Take 1 tablet (20 mg total) by mouth daily for 7 days. 03/15/22 03/22/22  Piontek, Rocky, MD  latanoprost (XALATAN) 0.005 % ophthalmic  solution Place 1 drop into both eyes at bedtime.  10/24/16   [provider]  loratadine  (CLARITIN ) 10 MG tablet TAKE 1 TABLET BY MOUTH EVERY DAY 06/30/22   Georgina Speaks, FNP  NAMZARIC  28-10 MG CP24 TAKE 1 CAPSULE BY MOUTH EVERY DAY 02/23/22   Georgina Speaks, FNP  omeprazole  (PRILOSEC) 20 MG capsule TAKE 1 CAPSULE BY MOUTH EVERY DAY 03/04/22   Moore, [name], FNP  predniSONE  (DELTASONE ) 5 MG tablet Take 2 tablets by mouth daily x1 wk, 1.5 tablets daily x1 wk, 1 tablet daily x1wk, half tablet daily x1wk. Patient not taking: Reported on 04/21/2022 10/13/21   Cheryl Waddell HERO, PA-C  QUEtiapine  (SEROQUEL ) 100 MG tablet TAKE 1 TABLET BY MOUTH AT BEDTIME 02/23/22   Georgina Speaks, FNP  simvastatin  (ZOCOR ) 20 MG tablet TAKE 1 TABLET BY MOUTH EVERY EVENING 02/06/23   Georgina Speaks, FNP  SYNTHROID  75 MCG tablet TAKE 1 TABLET BY MOUTH EVERY DAY BEFORE BREAKFAST 06/18/22   Georgina Speaks, FNP  timolol  (TIMOPTIC ) 0.5 % ophthalmic solution 1 drop 2 (two) times daily. 06/17/20   [provider]  Vitamin D , Ergocalciferol , (DRISDOL ) 1.25 MG (50000 UNIT) CAPS capsule TAKE 1 CAPSULE BY MOUTH TWICE A WEEK 06/02/22   Georgina Speaks, FNP    Physical Exam: Vitals:   04/14/24 0200 04/14/24 0500 04/14/24 0837 04/14/24 0851  BP: (!) 140/65 135/76 (!) 163/84   Pulse: (!) 119 (!) 119 (!) 118   Resp: 20 20 (!) 29   Temp: (!) 101 F (38.3 C) 99.4 F (37.4 C)  (!) 102.7 F (39.3 C)  TempSrc: Oral Oral  Oral  SpO2: 96% 97% 97%      Constitutional: NAD, calm, comfortable Respiratory: Clear to auscultation bilaterally anteriorly, no wheezing, no crackles. Normal respiratory effort. No accessory muscle use.  Cardiovascular: Tachycardic rate, regular rhythm  Abdomen: Soft, nondistended, nontender to palpation. Musculoskeletal: No joint deformity upper and lower extremities. No contractures. Normal muscle tone.  Skin: no rashes, lesions, ulcers on exposed skin   Labs on Admission: I have personally reviewed  following labs and imaging studies  CBC: Recent Labs  Lab 04/14/24 0415  WBC 20.1*  NEUTROABS 15.4*  HGB 11.1*  HCT 34.0*  MCV 71.4*  PLT 190   Basic Metabolic Panel: Recent Labs  Lab 04/14/24 0415  NA 137  K 3.9  CL 99  CO2 20*  GLUCOSE 103*  BUN 29*  CREATININE 1.66*  CALCIUM 9.6   GFR: CrCl cannot be calculated (Unknown ideal weight.). Liver Function Tests: Recent Labs  Lab 04/14/24 0415  AST 51*  ALT 28  ALKPHOS 183*  BILITOT 1.3*  PROT 7.7  ALBUMIN 3.7   No results for input(s): LIPASE, AMYLASE in the last 168 hours. No results for input(s): AMMONIA in the last 168 hours. Coagulation Profile: No results for input(s): INR, PROTIME in the  last 168 hours. Cardiac Enzymes: No results for input(s): CKTOTAL, CKMB, CKMBINDEX, TROPONINI in the last 168 hours. BNP (last 3 results) No results for input(s): PROBNP in the last 8760 hours. HbA1C: No results for input(s): HGBA1C in the last 72 hours. CBG: No results for input(s): GLUCAP in the last 168 hours. Lipid Profile: No results for input(s): CHOL, HDL, LDLCALC, TRIG, CHOLHDL, LDLDIRECT in the last 72 hours. Thyroid  Function Tests: No results for input(s): TSH, T4TOTAL, FREET4, T3FREE, THYROIDAB in the last 72 hours. Anemia Panel: No results for input(s): VITAMINB12, FOLATE, FERRITIN, TIBC, IRON, RETICCTPCT in the last 72 hours. Urine analysis:    Component Value Date/Time   COLORURINE AMBER (A) 04/14/2024 0818   APPEARANCEUR TURBID (A) 04/14/2024 0818   LABSPEC 1.017 04/14/2024 0818   PHURINE 5.0 04/14/2024 0818   GLUCOSEU NEGATIVE 04/14/2024 0818   HGBUR MODERATE (A) 04/14/2024 0818   BILIRUBINUR NEGATIVE 04/14/2024 0818   BILIRUBINUR negative 02/21/2019 1607   KETONESUR 5 (A) 04/14/2024 0818   PROTEINUR 100 (A) 04/14/2024 0818   UROBILINOGEN 2.0 (A) 02/21/2019 1607   NITRITE NEGATIVE 04/14/2024 0818   LEUKOCYTESUR LARGE (A) 04/14/2024  0818   Sepsis Labs: !!!!!!!!!!!!!!!!!!!!!!!!!!!!!!!!!!!!!!!!!!!! @LABRCNTIP (procalcitonin:4,lacticidven:4) ) Recent Results (from the past 240 hours)  Resp panel by RT-PCR (RSV, Flu A&B, Covid) Anterior Nasal Swab     Status: None   Collection Time: 04/14/24  2:23 AM   Specimen: Anterior Nasal Swab  Result Value Ref Range Status   SARS Coronavirus 2 by RT PCR NEGATIVE NEGATIVE Final    Comment: (NOTE) SARS-CoV-2 target nucleic acids are NOT DETECTED.  The SARS-CoV-2 RNA is generally detectable in upper respiratory specimens during the acute phase of infection. The lowest concentration of SARS-CoV-2 viral copies this assay can detect is 138 copies/mL. A negative result does not preclude SARS-Cov-2 infection and should not be used as the sole basis for treatment or other patient management decisions. A negative result may occur with  improper specimen collection/handling, submission of specimen other than nasopharyngeal swab, presence of viral mutation(s) within the areas targeted by this assay, and inadequate number of viral copies(<138 copies/mL). A negative result must be combined with clinical observations, patient history, and epidemiological information. The expected result is Negative.  Fact Sheet for Patients:  BloggerCourse.com  Fact Sheet for Healthcare Providers:  SeriousBroker.it  This test is no t yet approved or cleared by the [place]  FDA and  has been authorized for detection and/or diagnosis of SARS-CoV-2 by FDA under an Emergency Use Authorization (EUA). This EUA will remain  in effect (meaning this test can be used) for the duration of the COVID-19 declaration under Section 564(b)(1) of the Act, 21 U.S.C.section 360bbb-3(b)(1), unless the authorization is terminated  or revoked sooner.       Influenza A by PCR NEGATIVE NEGATIVE Final   Influenza B by PCR NEGATIVE NEGATIVE Final    Comment: (NOTE) The  Xpert Xpress SARS-CoV-2/FLU/RSV plus assay is intended as an aid in the diagnosis of influenza from Nasopharyngeal swab specimens and should not be used as a sole basis for treatment. Nasal washings and aspirates are unacceptable for Xpert Xpress SARS-CoV-2/FLU/RSV testing.  Fact Sheet for Patients: BloggerCourse.com  Fact Sheet for Healthcare Providers: SeriousBroker.it  This test is not yet approved or cleared by the [place]  FDA and has been authorized for detection and/or diagnosis of SARS-CoV-2 by FDA under an Emergency Use Authorization (EUA). This EUA will remain in effect (meaning this test can be used) for the duration  of the COVID-19 declaration under Section 564(b)(1) of the Act, 21 U.S.C. section 360bbb-3(b)(1), unless the authorization is terminated or revoked.     Resp Syncytial Virus by PCR NEGATIVE NEGATIVE Final    Comment: (NOTE) Fact Sheet for Patients: BloggerCourse.com  Fact Sheet for Healthcare Providers: SeriousBroker.it  This test is not yet approved or cleared by the [place]  FDA and has been authorized for detection and/or diagnosis of SARS-CoV-2 by FDA under an Emergency Use Authorization (EUA). This EUA will remain in effect (meaning this test can be used) for the duration of the COVID-19 declaration under Section 564(b)(1) of the Act, 21 U.S.C. section 360bbb-3(b)(1), unless the authorization is terminated or revoked.  Performed at [facility], [address] [place], [place] [address]      Radiological Exams on Admission: DG Hip Unilat W or Wo Pelvis 2-3 Views Left Result Date: 04/14/2024 CLINICAL DATA:  Left hip pain. EXAM: DG HIP (WITH OR WITHOUT PELVIS) 2-3V LEFT COMPARISON:  AP and frog-leg left hip views 12/18/2015. FINDINGS: Three views with AP pelvis and AP and frog-leg separate left hip views. There is no  evidence of hip fracture or dislocation. There is no evidence of pelvic fracture or diastasis. There is mild symmetric nonerosive arthrosis at the hips and SI joints. Degenerative change of the lower lumbar spine. There is patchy iliofemoral calcific arteriosclerosis. IMPRESSION: 1. No evidence of fracture or dislocation. 2. Mild symmetric nonerosive arthrosis at the hips and SI joints. 3. Degenerative change of the lower lumbar spine. 4. Peripheral vascular disease. Electronically Signed   By: Francis Quam M.D.   On: 04/14/2024 07:54   DG Chest Port 1 View Result Date: 04/14/2024 EXAM: 1 VIEW XRAY OF THE CHEST 04/14/2024 03:43:00 AM COMPARISON: 03/24/2020 CLINICAL HISTORY: Questionable sepsis - evaluate for abnormality. Table formatting from the original note was not included. Images from the original note were not included. BIBA from Home, Fall at homec/o neck pain and stiffness, Left hip pain on palpitation. No Thinners. Hx blindness, dementia, anxiety. FINDINGS: LUNGS AND PLEURA: No focal pulmonary opacity. No pulmonary edema. No pleural effusion. No pneumothorax. HEART AND MEDIASTINUM: Cardiomegaly. BONES AND SOFT TISSUES: Old left upper rib fracture deformities. No acute osseous abnormality. IMPRESSION: 1. No acute cardiopulmonary abnormality. 2. Mild cardiomegaly. Electronically signed by: Pinkie Pebbles MD 04/14/2024 03:46 AM EDT RP Workstation: [id number]   CT Head Wo Contrast Result Date: 04/14/2024 EXAM: CT HEAD AND CERVICAL SPINE 04/14/2024 03:09:24 AM TECHNIQUE: CT of the head and cervical spine was performed without the administration of intravenous contrast. Multiplanar reformatted images are provided for review. Automated exposure control, iterative reconstruction, and/or weight based adjustment of the mA/kV was utilized to reduce the radiation dose to as low as reasonably achievable. COMPARISON: None available. CLINICAL HISTORY: Syncope/presyncope, cerebrovascular cause suspected.  FINDINGS: CT HEAD BRAIN AND VENTRICLES: No acute intracranial hemorrhage. No mass effect or midline shift. No abnormal extra-axial fluid collection. No evidence of acute infarct. No hydrocephalus. ORBITS: No acute abnormality. SINUSES AND MASTOIDS: No acute abnormality. SOFT TISSUES AND SKULL: No acute skull fracture. No acute soft tissue abnormality. CT CERVICAL SPINE BONES AND ALIGNMENT: No acute fracture or traumatic malalignment. DEGENERATIVE CHANGES: Multilevel cervical degenerative disc disease. No high-grade spinal canal stenosis. SOFT TISSUES: No prevertebral soft tissue swelling. IMPRESSION: 1. No acute intracranial abnormality. 2. No acute abnormality of the cervical spine. Electronically signed by: Franky Stanford MD 04/14/2024 03:16 AM EDT RP Workstation: [id number]   CT Cervical Spine Wo Contrast Result Date: 04/14/2024  EXAM: CT HEAD AND CERVICAL SPINE 04/14/2024 03:09:24 AM TECHNIQUE: CT of the head and cervical spine was performed without the administration of intravenous contrast. Multiplanar reformatted images are provided for review. Automated exposure control, iterative reconstruction, and/or weight based adjustment of the mA/kV was utilized to reduce the radiation dose to as low as reasonably achievable. COMPARISON: None available. CLINICAL HISTORY: Syncope/presyncope, cerebrovascular cause suspected. FINDINGS: CT HEAD BRAIN AND VENTRICLES: No acute intracranial hemorrhage. No mass effect or midline shift. No abnormal extra-axial fluid collection. No evidence of acute infarct. No hydrocephalus. ORBITS: No acute abnormality. SINUSES AND MASTOIDS: No acute abnormality. SOFT TISSUES AND SKULL: No acute skull fracture. No acute soft tissue abnormality. CT CERVICAL SPINE BONES AND ALIGNMENT: No acute fracture or traumatic malalignment. DEGENERATIVE CHANGES: Multilevel cervical degenerative disc disease. No high-grade spinal canal stenosis. SOFT TISSUES: No prevertebral soft tissue swelling.  IMPRESSION: 1. No acute intracranial abnormality. 2. No acute abnormality of the cervical spine. Electronically signed by: Franky Stanford MD 04/14/2024 03:16 AM EDT RP Workstation: [id number]    EKG: Independently reviewed.  Sinus tachycardia, rate 120  Assessment/Plan Principal Problem:   Sepsis secondary to UTI [facility]) Active Problems:   Rheumatoid arthritis involving multiple sites with positive rheumatoid factor (HCC)   History of gastroesophageal reflux (GERD)   History of hypothyroidism   Essential hypertension   Acquired hypothyroidism   Alzheimer's disease (HCC)   Acute metabolic encephalopathy   Falls   AKI (acute kidney injury) (HCC)   Sepsis secondary to UTI - Presented with fever, tachycardia, leukocytosis -UA showing large leukocytes, few bacteria, greater than 50 WBCs.  Urine culture pending - Blood cultures pending - Empiric Rocephin   Acute metabolic encephalopathy with underlying dementia - In setting of above, monitor - Delirium precaution  Recurrent falls - PT OT  AKI - Baseline creatinine 1 - IVF   Hypertension - Norvasc   Hypothyroidism - Synthroid   Hyperlipidemia - Zocor     DVT prophylaxis:  SCDs Start: 04/14/24 9388  Code Status: Full, confirmed with family at bedside   Family Communication: Son and daughter at bedside Disposition Plan: Home Consults called: None  Severity of Illness: The appropriate patient status for this patient is INPATIENT. Inpatient status is judged to be reasonable and necessary in order to provide the required intensity of service to ensure the patient's safety. The patient's presenting symptoms, physical exam findings, and initial radiographic and laboratory data in the context of their chronic comorbidities is felt to place them at high risk for further clinical deterioration. Furthermore, it is not anticipated that the patient will be medically stable for discharge from the hospital within 2 midnights of admission.    * I certify that at the point of admission it is my clinical judgment that the patient will require inpatient hospital care spanning beyond 2 midnights from the point of admission due to high intensity of service, high risk for further deterioration and high frequency of surveillance required.DEWAINE Delon Hoe, DO [facility] 04/14/2024, 9:24 AM   Available via Epic secure chat 7am-7pm After these hours, please refer to coverage provider listed on [email or web address]

## 2024-04-14 NOTE — ED Notes (Signed)
 I-stat Lactic = 1.5

## 2024-04-15 ENCOUNTER — Other Ambulatory Visit: Payer: Self-pay

## 2024-04-15 ENCOUNTER — Inpatient Hospital Stay (HOSPITAL_COMMUNITY)

## 2024-04-15 DIAGNOSIS — R7881 Bacteremia: Secondary | ICD-10-CM

## 2024-04-15 DIAGNOSIS — N390 Urinary tract infection, site not specified: Secondary | ICD-10-CM | POA: Diagnosis not present

## 2024-04-15 DIAGNOSIS — A419 Sepsis, unspecified organism: Secondary | ICD-10-CM | POA: Diagnosis not present

## 2024-04-15 LAB — CBC
HCT: 31.4 % — ABNORMAL LOW (ref 36.0–46.0)
Hemoglobin: 10.7 g/dL — ABNORMAL LOW (ref 12.0–15.0)
MCH: 24 pg — ABNORMAL LOW (ref 26.0–34.0)
MCHC: 34.1 g/dL (ref 30.0–36.0)
MCV: 70.6 fL — ABNORMAL LOW (ref 80.0–100.0)
Platelets: 165 K/uL (ref 150–400)
RBC: 4.45 MIL/uL (ref 3.87–5.11)
RDW: 15.8 % — ABNORMAL HIGH (ref 11.5–15.5)
WBC: 16.4 K/uL — ABNORMAL HIGH (ref 4.0–10.5)
nRBC: 0 % (ref 0.0–0.2)

## 2024-04-15 LAB — BLOOD CULTURE ID PANEL (REFLEXED) - BCID2

## 2024-04-15 LAB — BASIC METABOLIC PANEL WITH GFR
Anion gap: 15 (ref 5–15)
BUN: 19 mg/dL (ref 8–23)
CO2: 21 mmol/L — ABNORMAL LOW (ref 22–32)
Calcium: 9.1 mg/dL (ref 8.9–10.3)
Chloride: 102 mmol/L (ref 98–111)
Creatinine, Ser: 1.13 mg/dL — ABNORMAL HIGH (ref 0.44–1.00)
GFR, Estimated: 47 mL/min — ABNORMAL LOW (ref >60)
Glucose, Bld: 83 mg/dL (ref 70–99)
Potassium: 3.6 mmol/L (ref 3.5–5.1)
Sodium: 138 mmol/L (ref 135–145)

## 2024-04-15 MED ORDER — SODIUM CHLORIDE 0.9 % IV SOLN: INTRAVENOUS | Status: DC

## 2024-04-15 MED ORDER — VANCOMYCIN HCL 750 MG/150ML IV SOLN: 750.0000 mg | INTRAVENOUS | Status: DC

## 2024-04-15 MED ORDER — VANCOMYCIN HCL 1500 MG/300ML IV SOLN
1500.0000 mg | Freq: Once | INTRAVENOUS | Status: AC
Start: 2024-04-15 — End: 2024-04-15
  Administered 2024-04-15: 1500 mg via INTRAVENOUS
  Filled 2024-04-15: qty 300

## 2024-04-15 NOTE — Progress Notes (Signed)
 PROGRESS NOTE    Jordan [name]  FMW:[id number] DOB: 11-Nov-1933 DOA: 04/14/2024 PCP: [name], NP  Outpatient Specialists:     Brief Narrative:  Patient is an 89 year old female with past medical history significant for dementia, hypertension, hyperlipidemia and anxiety.  Patient was admitted with repeated falls and reported close incoherence.  On presentation, temperature of 101 F was reported, patient was tachycardic with WBC of 20.1 and serum creatinine of 1.66.  CT head was unremarkable.  Chest x-ray was negative.  Blood and urine cultures are growing E. coli.  Blood cultures also growing MSSA.  Patient is currently on IV Rocephin .    04/15/2024: Serum creatinine has improved from 1.66-1.13.  WBC has improved from 20.1-16.4.  Hemoglobin of 10.7 g/dL noted.  Continue IV fluid.  Renal ultrasound.  Patient may end up needing CT scan of the abdomen and pelvis with contrast, however, we will hold off for now, considering recent acute kidney injury.  No significant history from patient.  Infectious disease input is highly appreciated.  Infectious diseases directing antibiotics management, and further workup.  As documented above, urine and blood cultures have grown E. coli.  Blood cultures also growing MSSA.  Infectious diseases repeating blood culture.  Echocardiogram is planned.  Assessment & Plan:   Principal Problem:   Sepsis secondary to UTI [facility]) Active Problems:   Rheumatoid arthritis involving multiple sites with positive rheumatoid factor (HCC)   History of gastroesophageal reflux (GERD)   History of hypothyroidism   Essential hypertension   Acquired hypothyroidism   Alzheimer's disease (HCC)   Acute metabolic encephalopathy   Falls   AKI (acute kidney injury) (HCC)   Sepsis secondary to UTI/E. coli - Presented with fever, tachycardia, leukocytosis - Urine and blood cultures are growing E. coli. - Blood culture has also grown MSSA. - Infectious disease input is  highly appreciated. - Patient is on IV Rocephin . - Renal ultrasound ordered. - Consider CT abdomen and pelvis, including stone protocol the next 1 to 2 days (do not want to expose patient to contrast dye, considering recent AKI).  MSSA bacteremia: - Infectious disease input is appreciated. - Blood cultures should be repeated by the infectious disease team. - Echocardiogram. - Patient is currently on IV Rocephin .:   Acute metabolic encephalopathy with underlying dementia - In setting of above, monitor - Delirium precaution   Recurrent falls - PT OT   AKI - Baseline creatinine 1 - Likely prerenal. - Improved significantly. - Continue to monitor renal function and electrolytes.     Hypertension - Norvasc    Hypothyroidism - Synthroid    Hyperlipidemia - Zocor   DVT prophylaxis: Subcutaneous heparin  Code Status: Full code Family Communication:  Disposition Plan: Inpatient.  Will depend on hospital course   Consultants:  Patient's disease team  Procedures:  None.  Antimicrobials:  IV Rocephin  2 g every 24 hours   Subjective: No significant history from patient.  Objective: Vitals:   04/14/24 2144 04/15/24 0110 04/15/24 0424 04/15/24 0425  BP: 125/63 139/70 (!) 141/87   Pulse: 96 92 (!) 105   Resp: 18 18 18    Temp: 99.6 F (37.6 C) 98 F (36.7 C) 97.7 F (36.5 C)   TempSrc: Axillary Oral Oral   SpO2: 99% 100% 100%   Weight:    (P) 73 kg  Height:        Intake/Output Summary (Last 24 hours) at 04/15/2024 0818 Last data filed at 04/15/2024 0230 Gross per 24 hour  Intake 250 ml  Output 500  ml  Net -250 ml   Filed Weights   04/14/24 1624 04/14/24 2047 04/15/24 0425  Weight: 72.6 kg 74 kg (P) 73 kg    Examination:  General exam: Resting quietly.  Not in distress.  No coherent history from patient.   Respiratory system: Clear to auscultation. Respiratory effort normal. Cardiovascular system: S1 & S2 heard Gastrointestinal system: Abdomen is soft and  nontender.   Central nervous system: Sleepy.    Data Reviewed: I have personally reviewed following labs and imaging studies  CBC: Recent Labs  Lab 04/14/24 0415 04/15/24 0449  WBC 20.1* 16.4*  NEUTROABS 15.4*  --   HGB 11.1* 10.7*  HCT 34.0* 31.4*  MCV 71.4* 70.6*  PLT 190 165   Basic Metabolic Panel: Recent Labs  Lab 04/14/24 0415 04/15/24 0449  NA 137 138  K 3.9 3.6  CL 99 102  CO2 20* 21*  GLUCOSE 103* 83  BUN 29* 19  CREATININE 1.66* 1.13*  CALCIUM 9.6 9.1   GFR: Estimated Creatinine Clearance: 31.7 mL/min (A) (by C-G formula based on SCr of 1.13 mg/dL (H)). Liver Function Tests: Recent Labs  Lab 04/14/24 0415  AST 51*  ALT 28  ALKPHOS 183*  BILITOT 1.3*  PROT 7.7  ALBUMIN 3.7   No results for input(s): LIPASE, AMYLASE in the last 168 hours. No results for input(s): AMMONIA in the last 168 hours. Coagulation Profile: Recent Labs  Lab 04/14/24 0918  INR 1.2   Cardiac Enzymes: No results for input(s): CKTOTAL, CKMB, CKMBINDEX, TROPONINI in the last 168 hours. BNP (last 3 results) No results for input(s): PROBNP in the last 8760 hours. HbA1C: No results for input(s): HGBA1C in the last 72 hours. CBG: No results for input(s): GLUCAP in the last 168 hours. Lipid Profile: No results for input(s): CHOL, HDL, LDLCALC, TRIG, CHOLHDL, LDLDIRECT in the last 72 hours. Thyroid  Function Tests: No results for input(s): TSH, T4TOTAL, FREET4, T3FREE, THYROIDAB in the last 72 hours. Anemia Panel: No results for input(s): VITAMINB12, FOLATE, FERRITIN, TIBC, IRON, RETICCTPCT in the last 72 hours. Urine analysis:    Component Value Date/Time   COLORURINE AMBER (A) 04/14/2024 0818   APPEARANCEUR TURBID (A) 04/14/2024 0818   LABSPEC 1.017 04/14/2024 0818   PHURINE 5.0 04/14/2024 0818   GLUCOSEU NEGATIVE 04/14/2024 0818   HGBUR MODERATE (A) 04/14/2024 0818   BILIRUBINUR NEGATIVE 04/14/2024 0818    BILIRUBINUR negative 02/21/2019 1607   KETONESUR 5 (A) 04/14/2024 0818   PROTEINUR 100 (A) 04/14/2024 0818   UROBILINOGEN 2.0 (A) 02/21/2019 1607   NITRITE NEGATIVE 04/14/2024 0818   LEUKOCYTESUR LARGE (A) 04/14/2024 0818   Sepsis Labs: @LABRCNTIP (procalcitonin:4,lacticidven:4)  ) Recent Results (from the past 240 hours)  Resp panel by RT-PCR (RSV, Flu A&B, Covid) Anterior Nasal Swab     Status: None   Collection Time: 04/14/24  2:23 AM   Specimen: Anterior Nasal Swab  Result Value Ref Range Status   SARS Coronavirus 2 by RT PCR NEGATIVE NEGATIVE Final    Comment: (NOTE) SARS-CoV-2 target nucleic acids are NOT DETECTED.  The SARS-CoV-2 RNA is generally detectable in upper respiratory specimens during the acute phase of infection. The lowest concentration of SARS-CoV-2 viral copies this assay can detect is 138 copies/mL. A negative result does not preclude SARS-Cov-2 infection and should not be used as the sole basis for treatment or other patient management decisions. A negative result may occur with  improper specimen collection/handling, submission of specimen other than nasopharyngeal swab, presence of viral mutation(s) within  the areas targeted by this assay, and inadequate number of viral copies(<138 copies/mL). A negative result must be combined with clinical observations, patient history, and epidemiological information. The expected result is Negative.  Fact Sheet for Patients:  BloggerCourse.com  Fact Sheet for Healthcare Providers:  SeriousBroker.it  This test is no t yet approved or cleared by the [place]  FDA and  has been authorized for detection and/or diagnosis of SARS-CoV-2 by FDA under an Emergency Use Authorization (EUA). This EUA will remain  in effect (meaning this test can be used) for the duration of the COVID-19 declaration under Section 564(b)(1) of the Act, 21 U.S.C.section 360bbb-3(b)(1),  unless the authorization is terminated  or revoked sooner.       Influenza A by PCR NEGATIVE NEGATIVE Final   Influenza B by PCR NEGATIVE NEGATIVE Final    Comment: (NOTE) The Xpert Xpress SARS-CoV-2/FLU/RSV plus assay is intended as an aid in the diagnosis of influenza from Nasopharyngeal swab specimens and should not be used as a sole basis for treatment. Nasal washings and aspirates are unacceptable for Xpert Xpress SARS-CoV-2/FLU/RSV testing.  Fact Sheet for Patients: BloggerCourse.com  Fact Sheet for Healthcare Providers: SeriousBroker.it  This test is not yet approved or cleared by the [place]  FDA and has been authorized for detection and/or diagnosis of SARS-CoV-2 by FDA under an Emergency Use Authorization (EUA). This EUA will remain in effect (meaning this test can be used) for the duration of the COVID-19 declaration under Section 564(b)(1) of the Act, 21 U.S.C. section 360bbb-3(b)(1), unless the authorization is terminated or revoked.     Resp Syncytial Virus by PCR NEGATIVE NEGATIVE Final    Comment: (NOTE) Fact Sheet for Patients: BloggerCourse.com  Fact Sheet for Healthcare Providers: SeriousBroker.it  This test is not yet approved or cleared by the [place]  FDA and has been authorized for detection and/or diagnosis of SARS-CoV-2 by FDA under an Emergency Use Authorization (EUA). This EUA will remain in effect (meaning this test can be used) for the duration of the COVID-19 declaration under Section 564(b)(1) of the Act, 21 U.S.C. section 360bbb-3(b)(1), unless the authorization is terminated or revoked.  Performed at [facility], [address], [place], [place] [address]   Blood Culture (routine x 2)     Status: None (Preliminary result)   Collection Time: 04/14/24  4:17 AM   Specimen: BLOOD  Result Value Ref Range Status    Specimen Description   Final    BLOOD RIGHT ANTECUBITAL Performed at [facility], [address], [place], [place] [address]    Special Requests   Final    BOTTLES DRAWN AEROBIC AND ANAEROBIC Blood Culture results may not be optimal due to an inadequate volume of blood received in culture bottles Performed at [facility], [address], [place], [place] [address]    Culture  Setup Time   Final    GRAM NEGATIVE RODS IN BOTH AEROBIC AND ANAEROBIC BOTTLES CRITICAL RESULT CALLED TO, READ BACK BY AND VERIFIED WITH: PHARMD E JACKSON 04/14/2024 @ 2239 BY AB Performed at [facility], [address], [place], [place] [address]    Culture GRAM NEGATIVE RODS  Final   Report Status PENDING  Incomplete  Blood Culture ID Panel (Reflexed)     Status: Abnormal   Collection Time: 04/14/24  4:17 AM  Result Value Ref Range Status   Enterococcus faecalis NOT DETECTED NOT DETECTED Final   Enterococcus Faecium NOT DETECTED NOT DETECTED  Final   Listeria monocytogenes NOT DETECTED NOT DETECTED Final   Staphylococcus species NOT DETECTED NOT DETECTED Final   Staphylococcus aureus (BCID) NOT DETECTED NOT DETECTED Final   Staphylococcus epidermidis NOT DETECTED NOT DETECTED Final   Staphylococcus lugdunensis NOT DETECTED NOT DETECTED Final   Streptococcus species NOT DETECTED NOT DETECTED Final   Streptococcus agalactiae NOT DETECTED NOT DETECTED Final   Streptococcus pneumoniae NOT DETECTED NOT DETECTED Final   Streptococcus pyogenes NOT DETECTED NOT DETECTED Final   A.calcoaceticus-baumannii NOT DETECTED NOT DETECTED Final   Bacteroides fragilis NOT DETECTED NOT DETECTED Final   Enterobacterales DETECTED (A) NOT DETECTED Final    Comment: Enterobacterales represent a large order of gram negative bacteria, not a single organism. CRITICAL RESULT CALLED TO, READ BACK BY AND VERIFIED WITH: PHARMD E JACKSON 04/14/2024 @ 2239 BY AB    Enterobacter cloacae  complex NOT DETECTED NOT DETECTED Final   Escherichia coli DETECTED (A) NOT DETECTED Final    Comment: CRITICAL RESULT CALLED TO, READ BACK BY AND VERIFIED WITH: PHARMD E JACKSON 04/14/2024 @ 2239 BY AB    Klebsiella aerogenes NOT DETECTED NOT DETECTED Final   Klebsiella oxytoca NOT DETECTED NOT DETECTED Final   Klebsiella pneumoniae NOT DETECTED NOT DETECTED Final   Proteus species NOT DETECTED NOT DETECTED Final   Salmonella species NOT DETECTED NOT DETECTED Final   Serratia marcescens NOT DETECTED NOT DETECTED Final   Haemophilus influenzae NOT DETECTED NOT DETECTED Final   Neisseria meningitidis NOT DETECTED NOT DETECTED Final   Pseudomonas aeruginosa NOT DETECTED NOT DETECTED Final   Stenotrophomonas maltophilia NOT DETECTED NOT DETECTED Final   Candida albicans NOT DETECTED NOT DETECTED Final   Candida auris NOT DETECTED NOT DETECTED Final   Candida glabrata NOT DETECTED NOT DETECTED Final   Candida krusei NOT DETECTED NOT DETECTED Final   Candida parapsilosis NOT DETECTED NOT DETECTED Final   Candida tropicalis NOT DETECTED NOT DETECTED Final   Cryptococcus neoformans/gattii NOT DETECTED NOT DETECTED Final   CTX-M ESBL NOT DETECTED NOT DETECTED Final   Carbapenem resistance IMP NOT DETECTED NOT DETECTED Final   Carbapenem resistance KPC NOT DETECTED NOT DETECTED Final   Carbapenem resistance NDM NOT DETECTED NOT DETECTED Final   Carbapenem resist OXA 48 LIKE NOT DETECTED NOT DETECTED Final   Carbapenem resistance VIM NOT DETECTED NOT DETECTED Final    Comment: Performed at [facility], [address] [place], [place] [address]  Blood Culture (routine x 2)     Status: None (Preliminary result)   Collection Time: 04/14/24  9:18 AM   Specimen: BLOOD  Result Value Ref Range Status   Specimen Description   Final    BLOOD BLOOD LEFT ARM Performed at [facility], [address], [place], [place] [address]    Special Requests   Final    BOTTLES DRAWN  AEROBIC AND ANAEROBIC Blood Culture adequate volume Performed at [facility], [address], [place], [place] [address]    Culture  Setup Time   Final    GRAM POSITIVE COCCI IN CLUSTERS ANAEROBIC BOTTLE ONLY CRITICAL RESULT CALLED TO, READ BACK BY AND VERIFIED WITH: PHARMD E JACKSON 04/15/2024 @ 0339 BY AB Performed at [facility], [address] [place], [place] [address]    Culture [facility] POSITIVE COCCI  Final   Report Status PENDING  Incomplete  Blood Culture ID Panel (Reflexed)     Status: Abnormal   Collection Time: 04/14/24  9:18 AM  Result Value Ref Range Status   Enterococcus faecalis NOT DETECTED NOT DETECTED Final   Enterococcus Faecium NOT DETECTED NOT DETECTED Final   Listeria monocytogenes NOT DETECTED NOT DETECTED Final   Staphylococcus species DETECTED (A) NOT DETECTED Final    Comment: CRITICAL RESULT CALLED TO, READ BACK BY AND VERIFIED WITH: PHARMD E JACKSON 04/15/2024 @ 0339 BY AB    Staphylococcus aureus (BCID) DETECTED (A) NOT DETECTED Final    Comment: CRITICAL RESULT CALLED TO, READ BACK BY AND VERIFIED WITH: PHARMD E JACKSON 04/15/2024 @ 0339 BY AB    Staphylococcus epidermidis NOT DETECTED NOT DETECTED Final   Staphylococcus lugdunensis NOT DETECTED NOT DETECTED Final   Streptococcus species NOT DETECTED NOT DETECTED Final   Streptococcus agalactiae NOT DETECTED NOT DETECTED Final   Streptococcus pneumoniae NOT DETECTED NOT DETECTED Final   Streptococcus pyogenes NOT DETECTED NOT DETECTED Final   A.calcoaceticus-baumannii NOT DETECTED NOT DETECTED Final   Bacteroides fragilis NOT DETECTED NOT DETECTED Final   Enterobacterales NOT DETECTED NOT DETECTED Final   Enterobacter cloacae complex NOT DETECTED NOT DETECTED Final   Escherichia coli NOT DETECTED NOT DETECTED Final   Klebsiella aerogenes NOT DETECTED NOT DETECTED Final   Klebsiella oxytoca NOT DETECTED NOT DETECTED Final   Klebsiella pneumoniae NOT DETECTED NOT DETECTED Final    Proteus species NOT DETECTED NOT DETECTED Final   Salmonella species NOT DETECTED NOT DETECTED Final   Serratia marcescens NOT DETECTED NOT DETECTED Final   Haemophilus influenzae NOT DETECTED NOT DETECTED Final   Neisseria meningitidis NOT DETECTED NOT DETECTED Final   Pseudomonas aeruginosa NOT DETECTED NOT DETECTED Final   Stenotrophomonas maltophilia NOT DETECTED NOT DETECTED Final   Candida albicans NOT DETECTED NOT DETECTED Final   Candida auris NOT DETECTED NOT DETECTED Final   Candida glabrata NOT DETECTED NOT DETECTED Final   Candida krusei NOT DETECTED NOT DETECTED Final   Candida parapsilosis NOT DETECTED NOT DETECTED Final   Candida tropicalis NOT DETECTED NOT DETECTED Final   Cryptococcus neoformans/gattii NOT DETECTED NOT DETECTED Final   Meth resistant mecA/C and MREJ NOT DETECTED NOT DETECTED Final    Comment: Performed at [facility], [address], [place], [place] [address]         Radiology Studies: DG Hip Burnis ORN or Wo Pelvis 2-3 Views Left Result Date: 04/14/2024 CLINICAL DATA:  Left hip pain. EXAM: DG HIP (WITH OR WITHOUT PELVIS) 2-3V LEFT COMPARISON:  AP and frog-leg left hip views 12/18/2015. FINDINGS: Three views with AP pelvis and AP and frog-leg separate left hip views. There is no evidence of hip fracture or dislocation. There is no evidence of pelvic fracture or diastasis. There is mild symmetric nonerosive arthrosis at the hips and SI joints. Degenerative change of the lower lumbar spine. There is patchy iliofemoral calcific arteriosclerosis. IMPRESSION: 1. No evidence of fracture or dislocation. 2. Mild symmetric nonerosive arthrosis at the hips and SI joints. 3. Degenerative change of the lower lumbar spine. 4. Peripheral vascular disease. Electronically Signed   By: Francis Quam M.D.   On: 04/14/2024 07:54   DG Chest Port 1 View Result Date: 04/14/2024 EXAM: 1 VIEW XRAY OF THE CHEST 04/14/2024 03:43:00 AM COMPARISON: 03/24/2020 CLINICAL  HISTORY: Questionable sepsis - evaluate for abnormality. Table formatting from the original note was not included. Images from the original note were not included. BIBA from Home, Fall at homec/o neck pain and stiffness, Left hip pain on palpitation. No Thinners. Hx blindness, dementia, anxiety. FINDINGS: LUNGS AND PLEURA:  No focal pulmonary opacity. No pulmonary edema. No pleural effusion. No pneumothorax. HEART AND MEDIASTINUM: Cardiomegaly. BONES AND SOFT TISSUES: Old left upper rib fracture deformities. No acute osseous abnormality. IMPRESSION: 1. No acute cardiopulmonary abnormality. 2. Mild cardiomegaly. Electronically signed by: Pinkie Pebbles MD 04/14/2024 03:46 AM EDT RP Workstation: [id number]   CT Head Wo Contrast Result Date: 04/14/2024 EXAM: CT HEAD AND CERVICAL SPINE 04/14/2024 03:09:24 AM TECHNIQUE: CT of the head and cervical spine was performed without the administration of intravenous contrast. Multiplanar reformatted images are provided for review. Automated exposure control, iterative reconstruction, and/or weight based adjustment of the mA/kV was utilized to reduce the radiation dose to as low as reasonably achievable. COMPARISON: None available. CLINICAL HISTORY: Syncope/presyncope, cerebrovascular cause suspected. FINDINGS: CT HEAD BRAIN AND VENTRICLES: No acute intracranial hemorrhage. No mass effect or midline shift. No abnormal extra-axial fluid collection. No evidence of acute infarct. No hydrocephalus. ORBITS: No acute abnormality. SINUSES AND MASTOIDS: No acute abnormality. SOFT TISSUES AND SKULL: No acute skull fracture. No acute soft tissue abnormality. CT CERVICAL SPINE BONES AND ALIGNMENT: No acute fracture or traumatic malalignment. DEGENERATIVE CHANGES: Multilevel cervical degenerative disc disease. No high-grade spinal canal stenosis. SOFT TISSUES: No prevertebral soft tissue swelling. IMPRESSION: 1. No acute intracranial abnormality. 2. No acute abnormality of the cervical  spine. Electronically signed by: Franky Stanford MD 04/14/2024 03:16 AM EDT RP Workstation: [id number]   CT Cervical Spine Wo Contrast Result Date: 04/14/2024 EXAM: CT HEAD AND CERVICAL SPINE 04/14/2024 03:09:24 AM TECHNIQUE: CT of the head and cervical spine was performed without the administration of intravenous contrast. Multiplanar reformatted images are provided for review. Automated exposure control, iterative reconstruction, and/or weight based adjustment of the mA/kV was utilized to reduce the radiation dose to as low as reasonably achievable. COMPARISON: None available. CLINICAL HISTORY: Syncope/presyncope, cerebrovascular cause suspected. FINDINGS: CT HEAD BRAIN AND VENTRICLES: No acute intracranial hemorrhage. No mass effect or midline shift. No abnormal extra-axial fluid collection. No evidence of acute infarct. No hydrocephalus. ORBITS: No acute abnormality. SINUSES AND MASTOIDS: No acute abnormality. SOFT TISSUES AND SKULL: No acute skull fracture. No acute soft tissue abnormality. CT CERVICAL SPINE BONES AND ALIGNMENT: No acute fracture or traumatic malalignment. DEGENERATIVE CHANGES: Multilevel cervical degenerative disc disease. No high-grade spinal canal stenosis. SOFT TISSUES: No prevertebral soft tissue swelling. IMPRESSION: 1. No acute intracranial abnormality. 2. No acute abnormality of the cervical spine. Electronically signed by: Franky Stanford MD 04/14/2024 03:16 AM EDT RP Workstation: [id number]        Scheduled Meds:  amLODipine   5 mg Oral Daily   aspirin  EC  81 mg Oral Daily   heparin   5,000 Units Subcutaneous Q8H   levothyroxine   75 mcg Oral Q0600   simvastatin   20 mg Oral QPM   Continuous Infusions:  cefTRIAXone  (ROCEPHIN )  IV 2 g (04/15/24 0648)   lactated ringers  100 mL/hr at 04/14/24 2039   [START ON 04/17/2024] vancomycin        LOS: 1 day    Time spent: 55 minutes.    Leatrice Chapel, MD  [facility] Pager #: [phone] 7PM-7AM contact night  coverage as above

## 2024-04-15 NOTE — Progress Notes (Signed)
 PHARMACY - PHYSICIAN COMMUNICATION CRITICAL VALUE ALERT - BLOOD CULTURE IDENTIFICATION (BCID)  Jordan [name] is an 89 y.o. female who presented to [facility] on 04/14/2024 with a chief complaint of recurrent falls  Assessment:  staph aureus, no resistance 1/4  Name of physician (or Provider) Contacted: Blondie  Current antibiotics: CTX  Changes to prescribed antibiotics recommended:  Start vancomycin  1500mg  x1 then 750mg  q48h (AUC 531.4, Scr 1.66)  Results for orders placed or performed during the hospital encounter of 04/14/24  Blood Culture ID Panel (Reflexed) (Collected: 04/14/2024  9:18 AM)  Result Value Ref Range   Enterococcus faecalis NOT DETECTED NOT DETECTED   Enterococcus Faecium NOT DETECTED NOT DETECTED   Listeria monocytogenes NOT DETECTED NOT DETECTED   Staphylococcus species DETECTED (A) NOT DETECTED   Staphylococcus aureus (BCID) DETECTED (A) NOT DETECTED   Staphylococcus epidermidis NOT DETECTED NOT DETECTED   Staphylococcus lugdunensis NOT DETECTED NOT DETECTED   Streptococcus species NOT DETECTED NOT DETECTED   Streptococcus agalactiae NOT DETECTED NOT DETECTED   Streptococcus pneumoniae NOT DETECTED NOT DETECTED   Streptococcus pyogenes NOT DETECTED NOT DETECTED   A.calcoaceticus-baumannii NOT DETECTED NOT DETECTED   Bacteroides fragilis NOT DETECTED NOT DETECTED   Enterobacterales NOT DETECTED NOT DETECTED   Enterobacter cloacae complex NOT DETECTED NOT DETECTED   Escherichia coli NOT DETECTED NOT DETECTED   Klebsiella aerogenes NOT DETECTED NOT DETECTED   Klebsiella oxytoca NOT DETECTED NOT DETECTED   Klebsiella pneumoniae NOT DETECTED NOT DETECTED   Proteus species NOT DETECTED NOT DETECTED   Salmonella species NOT DETECTED NOT DETECTED   Serratia marcescens NOT DETECTED NOT DETECTED   Haemophilus influenzae NOT DETECTED NOT DETECTED   Neisseria meningitidis NOT DETECTED NOT DETECTED   Pseudomonas aeruginosa NOT DETECTED NOT DETECTED    Stenotrophomonas maltophilia NOT DETECTED NOT DETECTED   Candida albicans NOT DETECTED NOT DETECTED   Candida auris NOT DETECTED NOT DETECTED   Candida glabrata NOT DETECTED NOT DETECTED   Candida krusei NOT DETECTED NOT DETECTED   Candida parapsilosis NOT DETECTED NOT DETECTED   Candida tropicalis NOT DETECTED NOT DETECTED   Cryptococcus neoformans/gattii NOT DETECTED NOT DETECTED   Meth resistant mecA/C and MREJ NOT DETECTED NOT DETECTED    Leeroy Mace RPh 04/15/2024, 3:49 AM

## 2024-04-15 NOTE — Progress Notes (Signed)
 Pharmacy Antibiotic Note  Jordan [name] is a 89 y.o. female admitted on 04/14/2024 with bacteremia.  Pharmacy has been consulted for Vancomycin  dosing.  Plan: - Adjust Vancomycin  to ***  Height: 5' 1 (154.9 cm) Weight: (P) 73 kg (160 lb 15 oz) IBW/kg (Calculated) : 47.8  Temp (24hrs), Avg:99.1 F (37.3 C), Min:97.7 F (36.5 C), Max:102.7 F (39.3 C)  Recent Labs  Lab 04/14/24 0415 04/14/24 0417 04/15/24 0449  WBC 20.1*  --  16.4*  CREATININE 1.66*  --  1.13*  LATICACIDVEN  --  1.5  --     Estimated Creatinine Clearance: 31.7 mL/min (A) (by C-G formula based on SCr of 1.13 mg/dL (H)).    Allergies  Allergen Reactions   Penicillins Hives    Antimicrobials this admission: *** *** >> *** *** *** >> ***  Dose adjustments this admission: ***  Microbiology results: *** BCx: *** *** UCx: ***  *** Sputum: ***  *** MRSA PCR: ***  Thank you for allowing pharmacy to be a part of this patient's care.  Gladis Almarie Caldron 04/15/2024 8:12 AM

## 2024-04-15 NOTE — Evaluation (Signed)
 Occupational Therapy Evaluation Patient Details Name: Jordan [name] MRN: [id number] DOB: 1933/09/08 Today's Date: 04/15/2024   History of Present Illness   Pt is an 89 y/o female presenting after falls at home and AMS. Found to have sepsis due to UTI, [name] and acute metabolic encephalopathy. PMH: dementia, anxiety, HTN, HLD, RA, CVA     Clinical Impressions PTA, pt lives alone but per chart, has 12 hours of caregiver assist per day, as well as family assist. Pt reports being ambulatory with a walker and able to manage ADLs and simple IADLs though has assist as needed; family not present to confirm PLOF given pt baseline cognitive deficits. Pt presents now near reported baseline, mobilizing well with RW though Min A needed to maneuver DME in unfamiliar environment given visual deficits. Pt able to assist with ADLs w/ no more than Min A needed. As long as pt has consistent support at home, recommend return to familiar environment without need for OT follow up.      If plan is discharge home, recommend the following:   A little help with walking and/or transfers;A little help with bathing/dressing/bathroom;Assistance with cooking/housework;Direct supervision/assist for medications management;Direct supervision/assist for financial management;Assist for transportation;Help with stairs or ramp for entrance     Functional Status Assessment   Patient has had a recent decline in their functional status and demonstrates the ability to make significant improvements in function in a reasonable and predictable amount of time.     Equipment Recommendations   None recommended by OT     Recommendations for Other Services         Precautions/Restrictions   Precautions Precautions: Fall;Other (comment) Precaution/Restrictions Comments: legally blind Restrictions Weight Bearing Restrictions Per Provider Order: No     Mobility Bed Mobility Overal bed mobility: Needs Assistance Bed  Mobility: Supine to Sit     Supine to sit: Supervision, HOB elevated     General bed mobility comments: cues for direction, increased time but able to complete without assist    Transfers Overall transfer level: Needs assistance Equipment used: Rolling walker (2 wheels) Transfers: Sit to/from Stand Sit to Stand: Contact guard assist                  Balance Overall balance assessment: Needs assistance Sitting-balance support: No upper extremity supported, Feet supported Sitting balance-Leahy Scale: Good     Standing balance support: No upper extremity supported, During functional activity, Bilateral upper extremity supported Standing balance-Leahy Scale: Fair                             ADL either performed or assessed with clinical judgement   ADL Overall ADL's : Needs assistance/impaired Eating/Feeding: Set up;Sitting Eating/Feeding Details (indicate cue type and reason): assist to open and prepare cereal, coffee. provided directional cues for location of tray, where coffee is in relation to cereal with good carryover. Grooming: Minimal assistance;Standing   Upper Body Bathing: Supervision/ safety;Sitting   Lower Body Bathing: Minimal assistance;Sitting/lateral leans;Sit to/from stand   Upper Body Dressing : Supervision/safety;Sitting   Lower Body Dressing: Minimal assistance;Sitting/lateral leans;Sit to/from stand   Toilet Transfer: Minimal assistance;Ambulation;Rolling walker (2 wheels)   Toileting- Clothing Manipulation and Hygiene: Minimal assistance;Sit to/from stand;Sitting/lateral lean       Functional mobility during ADLs: Minimal assistance;Rolling walker (2 wheels);Cueing for sequencing General ADL Comments: Assist for navigating RW in unfamiliar environment but overall fair balance with DME. Likely close to baseline  Vision Baseline Vision/History: 2 Legally blind Ability to See in Adequate Light: 4 Severely impaired Patient  Visual Report: No change from baseline Vision Assessment?: Vision impaired- to be further tested in functional context Additional Comments: baseline visual impairments     Perception         Praxis         Pertinent Vitals/Pain Pain Assessment Pain Assessment: No/denies pain     Extremity/Trunk Assessment Upper Extremity Assessment Upper Extremity Assessment: Overall WFL for tasks assessed;Right hand dominant   Lower Extremity Assessment Lower Extremity Assessment: Defer to PT evaluation   Cervical / Trunk Assessment Cervical / Trunk Assessment: Normal   Communication Communication Communication: No apparent difficulties   Cognition Arousal: Alert Behavior During Therapy: WFL for tasks assessed/performed Cognition: History of cognitive impairments             OT - Cognition Comments: hx of dementia, asking appropriate questions, repetitive at times but following commands and expressing needs well                 Following commands: Intact       Cueing  General Comments   Cueing Techniques: Verbal cues;Tactile cues      Exercises     Shoulder Instructions      Home Living Family/patient expects to be discharged to:: Private residence Living Arrangements: Other (Comment) (per chart, pt lives alone but pt reports daughter stays with her) Available Help at Discharge: Family;Personal care attendant Type of Home: House Home Access: Stairs to enter [employer] of Steps: 2   Home Layout: One level     Bathroom Shower/Tub: Chief Strategy Officer: Standard     Home Equipment: Agricultural consultant (2 wheels);Cane - single point;Tub bench   Additional Comments: home setup from prior admission. per chart, pt with 12 hours caregiver assist per day      Prior Functioning/Environment Prior Level of Function : Needs assist;Patient poor historian/Family not available             Mobility Comments: Pt reports using RW for  mobility ADLs Comments: Pt reports able to manage ADLs, even IADLs though per chart, pt with some assist from PCA, especially d/t low vision    OT Problem List: Decreased activity tolerance;Impaired vision/perception   OT Treatment/Interventions: Self-care/ADL training;Therapeutic exercise;Energy conservation;DME and/or AE instruction;Therapeutic activities;Patient/family education;Balance training      OT Goals(Current goals can be found in the care plan section)   Acute Rehab OT Goals Patient Stated Goal: have some coffee and scrambled eggs, turn up volume on gameshow OT Goal Formulation: With patient Time For Goal Achievement: 04/29/24 Potential to Achieve Goals: Good ADL Goals Pt Will Perform Grooming: with set-up;standing Pt Will Transfer to Toilet: with supervision;ambulating Additional ADL Goal #1: Pt to verbalize at least 3 fall prevention strategies to implement at home   OT Frequency:  Min 2X/week    Co-evaluation              AM-PAC OT 6 Clicks Daily Activity     Outcome Measure Help from another person eating meals?: A Little Help from another person taking care of personal grooming?: A Little Help from another person toileting, which includes using toliet, bedpan, or urinal?: A Little Help from another person bathing (including washing, rinsing, drying)?: A Little Help from another person to put on and taking off regular upper body clothing?: A Little Help from another person to put on and taking off regular lower body clothing?: A  Little 6 Click Score: 18   End of Session Equipment Utilized During Treatment: Rolling walker (2 wheels) Nurse Communication: Mobility status  Activity Tolerance: Patient tolerated treatment well Patient left: in chair;with call bell/phone within reach;with chair alarm set  OT Visit Diagnosis: Low vision, both eyes (H54.2)                Time: 9276-9253 OT Time Calculation (min): 23 min Charges:  OT General Charges $OT  Visit: 1 Visit OT Evaluation $OT Eval Low Complexity: 1 Low  Mliss NOVAK, OTR/L Acute Rehab Services Office: [phone]   Mliss Fish 04/15/2024, 8:03 AM

## 2024-04-15 NOTE — Consult Note (Addendum)
 NAME: Jordan [name]  DOB: December 25, 1933  MRN: [id number]  Date/Time: 04/15/2024 12:48 PM  REQUESTING PROVIDER: Angelyn alert Subjective:  REASON FOR CONSULT: MSSA bacteremia ?pt is a limited historian - chart reviewed Jordan [name] is a 89 y.o. female with a history of b/l TKA, HTN, rheumatoid arthritis on TNFI, legally blind due to glaucoma presented to the ED on 9/21 after a fall . As per hospitalist note she has had many falls  for which she was assessed by EMS one tome at home and then next time they brought her to the ED because she was incoherent. Pt lives by herself and has family that helps her  04/14/24 02:00  BP 140/65 !  Temp 101 F (38.3 C) !  Pulse Rate 119 !  Resp 20  SpO2 96 %    Latest Reference Range & Units 04/14/24 04:15  WBC 4.0 - 10.5 K/uL 20.1 (H)  Hemoglobin 12.0 - 15.0 g/dL 88.8 (L)  HCT 63.9 - 53.9 % 34.0 (L)  Platelets 150 - 400 K/uL 190  Creatinine 0.44 - 1.00 mg/dL 8.33 (H)   Blood culture sent CT head and CT cervical spine N CXR old left fracture ribs cardiomegaly Xray pelvis- no fracture mild arthrosis of both SI joint   Fishing/hunting/animal bird exposure Past Medical History:  Diagnosis Date   Allergy    Anxiety    per pt's daughter   Arthritis    Asthma    pt reported   Depression    GERD (gastroesophageal reflux disease)    Hearing deficit L   High cholesterol    Hypertension    Osteoporosis    Rheumatoid arthritis (HCC)    Stroke (HCC)     Past Surgical History:  Procedure Laterality Date   ABDOMINAL HYSTERECTOMY     KNEE ARTHROPLASTY     REPLACEMENT TOTAL KNEE BILATERAL Bilateral    WRIST SURGERY Left     Social History   Socioeconomic History   Marital status: Single    Spouse name: Not on file   Number of children: 2   Years of education: 8th   Highest education level: Not on file  Occupational History   Occupation: Retired  Tobacco Use   Smoking status: Former    Current packs/day: 0.00    Average packs/day:  0.3 packs/day for 13.0 years (3.3 ttl pk-yrs)    Types: Cigarettes    Start date: 08/18/1993    Quit date: 08/18/2006    Years since quitting: 17.6    Passive exposure: Never   Smokeless tobacco: Never  Vaping Use   Vaping status: Never Used  Substance and Sexual Activity   Alcohol use: No   Drug use: Not Currently    Types: Hydrocodone    Sexual activity: Not Currently  Other Topics Concern   Not on file  Social History Narrative   Lives at home with grandson.   Right-handed.   2-3 cups coffee per day.   Social Drivers of Corporate investment banker Strain: Low Risk  (04/21/2022)   Overall Financial Resource Strain (CARDIA)    Difficulty of Paying Living Expenses: Not hard at all  Food Insecurity: No Food Insecurity (04/21/2022)   Hunger Vital Sign    Worried About Running Out of Food in the Last Year: Never true    Ran Out of Food in the Last Year: Never true  Transportation Needs: No Transportation Needs (04/21/2022)   PRAPARE - Administrator, Civil Service (Medical): No  Lack of Transportation (Non-Medical): No  Physical Activity: Inactive (04/21/2022)   Exercise Vital Sign    Days of Exercise per Week: 0 days    Minutes of Exercise per Session: 0 min  Stress: No Stress Concern Present (04/21/2022)   [employer] of Occupational Health - Occupational Stress Questionnaire    Feeling of Stress : Not at all  Social Connections: Not on file  Intimate Partner Violence: Not At Risk (01/08/2019)   Humiliation, Afraid, Rape, and Kick questionnaire    Fear of Current or Ex-Partner: No    Emotionally Abused: No    Physically Abused: No    Sexually Abused: No    History reviewed. No pertinent family history. Allergies  Allergen Reactions   Penicillins Hives   I? Current Facility-Administered Medications  Medication Dose Route Frequency Provider Last Rate Last Admin   acetaminophen  (TYLENOL ) tablet 650 mg  650 mg Oral Q6H PRN Howerter, Justin B, DO   650 mg  at 04/14/24 2033   Or   acetaminophen  (TYLENOL ) suppository 650 mg  650 mg Rectal Q6H PRN Howerter, Justin B, DO       amLODipine  (NORVASC ) tablet 5 mg  5 mg Oral Daily Rojelio Nest, DO   5 mg at 04/15/24 0900   aspirin  EC tablet 81 mg  81 mg Oral Daily Rojelio Nest, DO   81 mg at 04/15/24 0900   cefTRIAXone  (ROCEPHIN ) 2 g in sodium chloride  0.9 % 100 mL IVPB  2 g Intravenous Q24H Rosario Eland I, MD 200 mL/hr at 04/15/24 0648 2 g at 04/15/24 0648   guaiFENesin -dextromethorphan  (ROBITUSSIN DM) 100-10 MG/5ML syrup 5 mL  5 mL Oral Q4H PRN Rosario Eland I, MD   5 mL at 04/14/24 2031   haloperidol  lactate (HALDOL ) injection 2 mg  2 mg Intravenous Q6H PRN Rojelio Nest, DO   2 mg at 04/15/24 0239   heparin  injection 5,000 Units  5,000 Units Subcutaneous Q8H Rojelio Nest, DO   5,000 Units at 04/15/24 0636   levothyroxine  (SYNTHROID ) tablet 75 mcg  75 mcg Oral Q0600 Rojelio Nest, DO   75 mcg at 04/15/24 9362   ondansetron  (ZOFRAN ) tablet 4 mg  4 mg Oral Q6H PRN Rojelio Nest, DO       Or   ondansetron  (ZOFRAN ) injection 4 mg  4 mg Intravenous Q6H PRN Rojelio Nest, DO       simvastatin  (ZOCOR ) tablet 20 mg  20 mg Oral QPM Rojelio Nest, DO   20 mg at 04/14/24 1745   [START ON 04/17/2024] vancomycin  (VANCOREADY) IVPB 750 mg/150 mL  750 mg Intravenous Q48H Rosario Eland I, MD         Abtx:  Anti-infectives (From admission, onward)    Start     Dose/Rate Route Frequency Ordered Stop   04/17/24 0600  vancomycin  (VANCOREADY) IVPB 750 mg/150 mL        750 mg 150 mL/hr over 60 Minutes Intravenous Every 48 hours 04/15/24 0352     04/15/24 0600  cefTRIAXone  (ROCEPHIN ) 2 g in sodium chloride  0.9 % 100 mL IVPB        2 g 200 mL/hr over 30 Minutes Intravenous Every 24 hours 04/14/24 2245     04/15/24 0430  vancomycin  (VANCOREADY) IVPB 1500 mg/300 mL        1,500 mg 150 mL/hr over 120 Minutes Intravenous  Once 04/15/24 0344 04/15/24 0617   04/14/24 0915  cefTRIAXone  (ROCEPHIN ) 1 g in  sodium chloride  0.9 % 100 mL IVPB  Status:  Discontinued        1 g 200 mL/hr over 30 Minutes Intravenous Every 24 hours 04/14/24 0911 04/14/24 2245   04/14/24 0330  aztreonam  (AZACTAM ) 2 g in sodium chloride  0.9 % 100 mL IVPB        2 g 200 mL/hr over 30 Minutes Intravenous  Once 04/14/24 0329 04/14/24 0715   04/14/24 0330  metroNIDAZOLE  (FLAGYL ) IVPB 500 mg        500 mg 100 mL/hr over 60 Minutes Intravenous  Once 04/14/24 0329 04/14/24 1034   04/14/24 0330  vancomycin  (VANCOCIN ) IVPB 1000 mg/200 mL premix  Status:  Discontinued        1,000 mg 200 mL/hr over 60 Minutes Intravenous  Once 04/14/24 0329 04/14/24 0911       REVIEW OF SYSTEMS: pt denies  Const: negative fever, negative chills, negative weight loss Eyes: negative diplopia or visual changes, negative eye pain ENT: negative coryza, negative sore throat Resp: negative cough, hemoptysis, dyspnea Cards: negative for chest pain, palpitations, lower extremity edema GU: negative for frequency, dysuria and hematuria GI: Negative for abdominal pain, diarrhea, bleeding, constipation Skin: negative for rash and pruritus Heme: negative for easy bruising and gum/nose bleeding MS:  weakness Neurolo:negative for headaches, dizziness, vertigo, has memory problems  Psych: negative for feelings of anxiety, depression  Endocrine: negative for thyroid , diabetes Allergy/Immunology- PCN ?  Objective:  VITALS:  BP 138/65 (BP Location: Right Arm)   Pulse 97   Temp 99.2 F (37.3 C) (Oral)   Resp 16   Ht 5' 1 (1.549 m)   Wt (P) 73 kg   SpO2 100%   BMI (P) 30.41 kg/m   PHYSICAL EXAM:  General: Alert, aitting in chair with eyes closed- opens eyes on asking to- legally blind- redness rt eye cooperative, no distress, appears stated age.  Head: Normocephalic, without obvious abnormality, atraumatic. Eyes: Conjunctivae clear, anicteric sclerae. Pupils are equal ENT Nares normal. No drainage or sinus tenderness. Lips, mucosa, and  tongue normal. No Thrush Neck: Supple, symmetrical, no adenopathy, thyroid : non tender no carotid bruit and no JVD. Back: No CVA tenderness. Lungs: Clear to auscultation bilaterally. No Wheezing or Rhonchi. No rales. Heart: Regular rate and rhythm, no murmur, rub or gallop. Abdomen: Soft, non-tender,not distended. Bowel sounds normal. No masses Extremities: atraumatic, no cyanosis. No edema. No clubbing Skin: No rashes or lesions. Or bruising Lymph: Cervical, supraclavicular normal. Neurologic: Grossly non-focal Pertinent Labs Lab Results CBC    Component Value Date/Time   WBC 16.4 (H) 04/15/2024 0449   RBC 4.45 04/15/2024 0449   HGB 10.7 (L) 04/15/2024 0449   HGB 12.1 11/05/2020 1239   HCT 31.4 (L) 04/15/2024 0449   HCT 37.6 11/05/2020 1239   PLT 165 04/15/2024 0449   PLT 240 11/05/2020 1239   MCV 70.6 (L) 04/15/2024 0449   MCV 75 (L) 11/05/2020 1239   MCH 24.0 (L) 04/15/2024 0449   MCHC 34.1 04/15/2024 0449   RDW 15.8 (H) 04/15/2024 0449   RDW 15.0 11/05/2020 1239   LYMPHSABS 2.0 04/14/2024 0415   LYMPHSABS 2.8 10/01/2020 1000   MONOABS 2.5 (H) 04/14/2024 0415   EOSABS 0.0 04/14/2024 0415   EOSABS 0.0 10/01/2020 1000   BASOSABS 0.0 04/14/2024 0415   BASOSABS 0.1 10/01/2020 1000       Latest Ref Rng & Units 04/15/2024    4:49 AM 04/14/2024    4:15 AM 07/15/2022    9:25 AM  CMP  Glucose 70 - 99 mg/dL 83  103  86   BUN 8 - 23 mg/dL 19  29  12    Creatinine 0.44 - 1.00 mg/dL 8.86  8.33  8.98   Sodium 135 - 145 mmol/L 138  137  140   Potassium 3.5 - 5.1 mmol/L 3.6  3.9  4.6   Chloride 98 - 111 mmol/L 102  99  106   CO2 22 - 32 mmol/L 21  20  25    Calcium 8.9 - 10.3 mg/dL 9.1  9.6  9.4   Total Protein 6.5 - 8.1 g/dL  7.7  7.3   Total Bilirubin 0.0 - 1.2 mg/dL  1.3  0.3   Alkaline Phos 38 - 126 U/L  183    AST 15 - 41 U/L  51  14   ALT 0 - 44 U/L  28  5       Microbiology: Recent Results (from the past 240 hours)  Resp panel by RT-PCR (RSV, Flu A&B, Covid)  Anterior Nasal Swab     Status: None   Collection Time: 04/14/24  2:23 AM   Specimen: Anterior Nasal Swab  Result Value Ref Range Status   SARS Coronavirus 2 by RT PCR NEGATIVE NEGATIVE Final    Comment: (NOTE) SARS-CoV-2 target nucleic acids are NOT DETECTED.  The SARS-CoV-2 RNA is generally detectable in upper respiratory specimens during the acute phase of infection. The lowest concentration of SARS-CoV-2 viral copies this assay can detect is 138 copies/mL. A negative result does not preclude SARS-Cov-2 infection and should not be used as the sole basis for treatment or other patient management decisions. A negative result may occur with  improper specimen collection/handling, submission of specimen other than nasopharyngeal swab, presence of viral mutation(s) within the areas targeted by this assay, and inadequate number of viral copies(<138 copies/mL). A negative result must be combined with clinical observations, patient history, and epidemiological information. The expected result is Negative.  Fact Sheet for Patients:  BloggerCourse.com  Fact Sheet for Healthcare Providers:  SeriousBroker.it  This test is no t yet approved or cleared by the [place]  FDA and  has been authorized for detection and/or diagnosis of SARS-CoV-2 by FDA under an Emergency Use Authorization (EUA). This EUA will remain  in effect (meaning this test can be used) for the duration of the COVID-19 declaration under Section 564(b)(1) of the Act, 21 U.S.C.section 360bbb-3(b)(1), unless the authorization is terminated  or revoked sooner.       Influenza A by PCR NEGATIVE NEGATIVE Final   Influenza B by PCR NEGATIVE NEGATIVE Final    Comment: (NOTE) The Xpert Xpress SARS-CoV-2/FLU/RSV plus assay is intended as an aid in the diagnosis of influenza from Nasopharyngeal swab specimens and should not be used as a sole basis for treatment. Nasal washings  and aspirates are unacceptable for Xpert Xpress SARS-CoV-2/FLU/RSV testing.  Fact Sheet for Patients: BloggerCourse.com  Fact Sheet for Healthcare Providers: SeriousBroker.it  This test is not yet approved or cleared by the [place]  FDA and has been authorized for detection and/or diagnosis of SARS-CoV-2 by FDA under an Emergency Use Authorization (EUA). This EUA will remain in effect (meaning this test can be used) for the duration of the COVID-19 declaration under Section 564(b)(1) of the Act, 21 U.S.C. section 360bbb-3(b)(1), unless the authorization is terminated or revoked.     Resp Syncytial Virus by PCR NEGATIVE NEGATIVE Final    Comment: (NOTE) Fact Sheet for Patients: BloggerCourse.com  Fact Sheet for Healthcare Providers: SeriousBroker.it  This test is  not yet approved or cleared by the [place]  FDA and has been authorized for detection and/or diagnosis of SARS-CoV-2 by FDA under an Emergency Use Authorization (EUA). This EUA will remain in effect (meaning this test can be used) for the duration of the COVID-19 declaration under Section 564(b)(1) of the Act, 21 U.S.C. section 360bbb-3(b)(1), unless the authorization is terminated or revoked.  Performed at [facility], [address] [place], [place] [address]   Blood Culture (routine x 2)     Status: Abnormal (Preliminary result)   Collection Time: 04/14/24  4:17 AM   Specimen: BLOOD  Result Value Ref Range Status   Specimen Description   Final    BLOOD RIGHT ANTECUBITAL Performed at [facility], [address] [place], [place] [address]    Special Requests   Final    BOTTLES DRAWN AEROBIC AND ANAEROBIC Blood Culture results may not be optimal due to an inadequate volume of blood received in culture bottles Performed at [facility], [address] [place], [place] [address]    Culture  Setup Time   Final    GRAM NEGATIVE RODS IN BOTH AEROBIC AND ANAEROBIC BOTTLES CRITICAL RESULT CALLED TO, READ BACK BY AND VERIFIED WITH: PHARMD E JACKSON 04/14/2024 @ 2239 BY AB    Culture (A)  Final    ESCHERICHIA COLI SUSCEPTIBILITIES TO FOLLOW Performed at [facility], [address], [place], [place] [address]    Report Status PENDING  Incomplete  Blood Culture ID Panel (Reflexed)     Status: Abnormal   Collection Time: 04/14/24  4:17 AM  Result Value Ref Range Status   Enterococcus faecalis NOT DETECTED NOT DETECTED Final   Enterococcus Faecium NOT DETECTED NOT DETECTED Final   Listeria monocytogenes NOT DETECTED NOT DETECTED Final   Staphylococcus species NOT DETECTED NOT DETECTED Final   Staphylococcus aureus (BCID) NOT DETECTED NOT DETECTED Final   Staphylococcus epidermidis NOT DETECTED NOT DETECTED Final   Staphylococcus lugdunensis NOT DETECTED NOT DETECTED Final   Streptococcus species NOT DETECTED NOT DETECTED Final   Streptococcus agalactiae NOT DETECTED NOT DETECTED Final   Streptococcus pneumoniae NOT DETECTED NOT DETECTED Final   Streptococcus pyogenes NOT DETECTED NOT DETECTED Final   A.calcoaceticus-baumannii NOT DETECTED NOT DETECTED Final   Bacteroides fragilis NOT DETECTED NOT DETECTED Final   Enterobacterales DETECTED (A) NOT DETECTED Final    Comment: Enterobacterales represent a large order of gram negative bacteria, not a single organism. CRITICAL RESULT CALLED TO, READ BACK BY AND VERIFIED WITH: PHARMD E JACKSON 04/14/2024 @ 2239 BY AB    Enterobacter cloacae complex NOT DETECTED NOT DETECTED Final   Escherichia coli DETECTED (A) NOT DETECTED Final    Comment: CRITICAL RESULT CALLED TO, READ BACK BY AND VERIFIED WITH: PHARMD E JACKSON 04/14/2024 @ 2239 BY AB    Klebsiella aerogenes NOT DETECTED NOT DETECTED Final   Klebsiella oxytoca NOT DETECTED NOT DETECTED Final   Klebsiella pneumoniae NOT  DETECTED NOT DETECTED Final   Proteus species NOT DETECTED NOT DETECTED Final   Salmonella species NOT DETECTED NOT DETECTED Final   Serratia marcescens NOT DETECTED NOT DETECTED Final   Haemophilus influenzae NOT DETECTED NOT DETECTED Final   Neisseria meningitidis NOT DETECTED NOT DETECTED Final   Pseudomonas aeruginosa NOT DETECTED NOT DETECTED Final   Stenotrophomonas maltophilia NOT DETECTED NOT DETECTED Final   Candida albicans NOT DETECTED NOT DETECTED Final   Candida auris NOT DETECTED NOT DETECTED Final  Candida glabrata NOT DETECTED NOT DETECTED Final   Candida krusei NOT DETECTED NOT DETECTED Final   Candida parapsilosis NOT DETECTED NOT DETECTED Final   Candida tropicalis NOT DETECTED NOT DETECTED Final   Cryptococcus neoformans/gattii NOT DETECTED NOT DETECTED Final   CTX-M ESBL NOT DETECTED NOT DETECTED Final   Carbapenem resistance IMP NOT DETECTED NOT DETECTED Final   Carbapenem resistance KPC NOT DETECTED NOT DETECTED Final   Carbapenem resistance NDM NOT DETECTED NOT DETECTED Final   Carbapenem resist OXA 48 LIKE NOT DETECTED NOT DETECTED Final   Carbapenem resistance VIM NOT DETECTED NOT DETECTED Final    Comment: Performed at [facility], [address] [place], [place] [address]  Urine Culture     Status: Abnormal (Preliminary result)   Collection Time: 04/14/24  8:18 AM   Specimen: Urine, Random  Result Value Ref Range Status   Specimen Description   Final    URINE, RANDOM Performed at [facility], [address] [place], [place] [address]    Special Requests   Final    NONE Reflexed from [id number] Performed at [facility], [address], [place], [place] [address]    Culture (A)  Final    40,000 COLONIES/mL ESCHERICHIA COLI SUSCEPTIBILITIES TO FOLLOW Performed at [facility], [address], [place], [place] [address]    Report Status PENDING  Incomplete  Blood Culture (routine x 2)     Status: None  (Preliminary result)   Collection Time: 04/14/24  9:18 AM   Specimen: BLOOD LEFT ARM  Result Value Ref Range Status   Specimen Description   Final    BLOOD LEFT ARM Performed at [facility], [address] [place], [place] [address]    Special Requests   Final    BOTTLES DRAWN AEROBIC AND ANAEROBIC Blood Culture adequate volume Performed at [facility], [address] [place], [place] [address]    Culture  Setup Time   Final    GRAM POSITIVE COCCI IN CLUSTERS ANAEROBIC BOTTLE ONLY CRITICAL RESULT CALLED TO, READ BACK BY AND VERIFIED WITH: PHARMD E JACKSON 04/15/2024 @ 0339 BY AB Performed at [facility], [address], [place], [place] [address]    Culture GRAM POSITIVE COCCI  Final   Report Status PENDING  Incomplete  Blood Culture ID Panel (Reflexed)     Status: Abnormal   Collection Time: 04/14/24  9:18 AM  Result Value Ref Range Status   Enterococcus faecalis NOT DETECTED NOT DETECTED Final   Enterococcus Faecium NOT DETECTED NOT DETECTED Final   Listeria monocytogenes NOT DETECTED NOT DETECTED Final   Staphylococcus species DETECTED (A) NOT DETECTED Final    Comment: CRITICAL RESULT CALLED TO, READ BACK BY AND VERIFIED WITH: PHARMD E JACKSON 04/15/2024 @ 0339 BY AB    Staphylococcus aureus (BCID) DETECTED (A) NOT DETECTED Final    Comment: CRITICAL RESULT CALLED TO, READ BACK BY AND VERIFIED WITH: PHARMD E JACKSON 04/15/2024 @ 0339 BY AB    Staphylococcus epidermidis NOT DETECTED NOT DETECTED Final   Staphylococcus lugdunensis NOT DETECTED NOT DETECTED Final   Streptococcus species NOT DETECTED NOT DETECTED Final   Streptococcus agalactiae NOT DETECTED NOT DETECTED Final   Streptococcus pneumoniae NOT DETECTED NOT DETECTED Final   Streptococcus pyogenes NOT DETECTED NOT DETECTED Final   A.calcoaceticus-baumannii NOT DETECTED NOT DETECTED Final   Bacteroides fragilis NOT DETECTED NOT DETECTED Final   Enterobacterales NOT DETECTED NOT  DETECTED Final   Enterobacter cloacae complex NOT DETECTED NOT DETECTED Final   Escherichia coli NOT DETECTED NOT DETECTED Final   Klebsiella aerogenes NOT DETECTED NOT DETECTED Final   Klebsiella oxytoca NOT DETECTED NOT DETECTED Final   Klebsiella pneumoniae NOT DETECTED NOT DETECTED Final   Proteus species NOT DETECTED NOT DETECTED Final   Salmonella species NOT DETECTED NOT DETECTED Final   Serratia marcescens NOT DETECTED NOT DETECTED Final   Haemophilus influenzae NOT DETECTED NOT DETECTED Final   Neisseria meningitidis NOT DETECTED NOT DETECTED Final   Pseudomonas aeruginosa NOT DETECTED NOT DETECTED Final   Stenotrophomonas maltophilia NOT DETECTED NOT DETECTED Final   Candida albicans NOT DETECTED NOT DETECTED Final   Candida auris NOT DETECTED NOT DETECTED Final   Candida glabrata NOT DETECTED NOT DETECTED Final   Candida krusei NOT DETECTED NOT DETECTED Final   Candida parapsilosis NOT DETECTED NOT DETECTED Final   Candida tropicalis NOT DETECTED NOT DETECTED Final   Cryptococcus neoformans/gattii NOT DETECTED NOT DETECTED Final   Meth resistant mecA/C and MREJ NOT DETECTED NOT DETECTED Final    Comment: Performed at [facility], [address] [place], [place] [address]    IMAGING RESULTS: CXR  I have personally reviewed the films Cardiomegaly?   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 This consult involved compex antimicrobial management   __I have personally spent  -75--minutes involved in face-to-face and non-face-to-face activities for this patient on the day of the visit. Professional time spent includes the following activities: Preparing to see the patient (review of tests), Obtaining and/or reviewing separately obtained history (admission/discharge record), Performing a medically appropriate examination and/or evaluation , Ordering medications/tests/procedures, referring and communicating with other health care professionals, Documenting clinical information in the EMR, Independently interpreting results (not separately reported), Communicating results to the patient/r, Counseling and educating the patient/ and Care coordination (not separately reported).  with hospitalist and ID pharmacist  ________________________________________________

## 2024-04-15 NOTE — TOC Initial Note (Signed)
 Transition of Care [facility]) - Initial/Assessment Note    Patient Details  Name: Jordan [name] MRN: [id number] Date of Birth: 06-30-1934  Transition of [facility]) CM/SW Contact:    Bascom Service, RN Phone Number: 04/15/2024, 3:40 PM  Clinical Narrative: Patient blind. Spoke to grandson Tyrell about d/c plans-home. Has private duty care. Has own transport home.Await PT eval & recc.                  Expected Discharge Plan: Home/Self Care Barriers to Discharge: Continued Medical Work up   Patient Goals and CMS Choice Patient states their goals for this hospitalization and ongoing recovery are:: Home CMS Medicare.gov Compare Post Acute Care list provided to:: Patient Represenative (must comment) (Tyrell(grandson))        Expected Discharge Plan and Services   Discharge Planning Services: CM Consult   Living arrangements for the past 2 months: Single Family Home                                      Prior Living Arrangements/Services Living arrangements for the past 2 months: Single Family Home Lives with:: Adult Children   Do you feel safe going back to the place where you live?: Yes          Current home services: DME, Other (comment) (cane,rw,3n1;private duty Social worker)    Activities of Daily Living   ADL Screening (condition at time of admission) Independently performs ADLs?: No Does the patient have a NEW difficulty with bathing/dressing/toileting/self-feeding that is expected to last >3 days?: No Does the patient have a NEW difficulty with getting in/out of bed, walking, or climbing stairs that is expected to last >3 days?: No Does the patient have a NEW difficulty with communication that is expected to last >3 days?: No Is the patient deaf or have difficulty hearing?: Yes Does the patient have difficulty seeing, even when wearing glasses/contacts?: Yes (pt is blind) Does the patient have difficulty concentrating, remembering, or making decisions?:  Yes  Permission Sought/Granted Permission sought to share information with : Case Manager Permission granted to share information with : Yes, Release of Information Signed              Emotional Assessment              Admission diagnosis:  SIRS (systemic inflammatory response syndrome) (HCC) [R65.10] AKI (acute kidney injury) (HCC) [N17.9] Sepsis secondary to UTI (HCC) [A41.9, N39.0] Fall, initial encounter [W19.XXXA] Patient Active Problem List   Diagnosis Date Noted   Sepsis secondary to UTI (HCC) 04/14/2024   Acute metabolic encephalopathy 04/14/2024   Falls 04/14/2024   AKI (acute kidney injury) (HCC) 04/14/2024   Chest pain    Hypotension 03/24/2020   Alzheimer's disease (HCC) 08/13/2019   Essential hypertension 08/13/2018   Acquired hypothyroidism 08/13/2018   Rash and nonspecific skin eruption 08/13/2018   Memory loss 04/12/2017   Depression 04/12/2017   Rheumatoid arthritis involving multiple sites with positive rheumatoid factor (HCC) 08/17/2016   High risk medication use 08/17/2016   H/O total knee replacement, bilateral 08/17/2016   Spondylosis of lumbar region without myelopathy or radiculopathy 08/17/2016   History of glaucoma/ patient is legally blind 08/17/2016   History of gastroesophageal reflux (GERD) 08/17/2016   History of diverticulosis 08/17/2016   History of hypothyroidism 08/17/2016   history of asthmatic bronchitis 08/17/2016   History of memory loss 08/17/2016   History of  atherosclerosis 08/17/2016   PCP:  [name], NP Pharmacy:   [facility] [place], [place] - [address] [place] [address] Phone: [phone] Fax: [phone]  [id number] - [place], [place] - [address] AT [facility] [address] [place] [address] Phone: [phone] Fax: [phone]     Social Drivers of Health (SDOH) Social History: SDOH Screenings   Food Insecurity: No  Food Insecurity (04/21/2022)  Housing: Low Risk  (11/19/2020)  Transportation Needs: No Transportation Needs (04/21/2022)  Depression (PHQ2-9): Low Risk  (04/21/2022)  Financial Resource Strain: Low Risk  (04/21/2022)  Physical Activity: Inactive (04/21/2022)  Stress: No Stress Concern Present (04/21/2022)  Tobacco Use: Medium Risk (04/14/2024)   SDOH Interventions:     Readmission Risk Interventions     No data to display

## 2024-04-15 NOTE — Evaluation (Signed)
 Physical Therapy Evaluation Patient Details Name: Jordan [name] MRN: [id number] DOB: Aug 18, 1933 Today's Date: 04/15/2024  History of Present Illness  Pt is an 89 y/o female presenting after falls at home and AMS. Found to have sepsis due to UTI, AKI and acute metabolic encephalopathy. PMH: dementia, anxiety, HTN, HLD, RA, CVA  Clinical Impression  Pt admitted with above diagnosis.  Pt agreeable to PT, cooperative, hx of dementia however follows commands without difficulty. Pt reports she uses RW outside of home however does not use it inside the house, does not walk long distances d/t her knees hurting. Pt amb 65' with RW, min assist for  walker management d/t unfamiliar environment, HR max 116. Anticipate steady progress in acute setting and no f/u needs at this time    Pt currently with functional limitations due to the deficits listed below (see PT Problem List). Pt will benefit from acute skilled PT to increase their independence and safety with mobility to allow discharge.           If plan is discharge home, recommend the following: A little help with walking and/or transfers;A little help with bathing/dressing/bathroom;Help with stairs or ramp for entrance;Assistance with cooking/housework;Direct supervision/assist for medications management;Assist for transportation   Can travel by private vehicle        Equipment Recommendations None recommended by PT  Recommendations for Other Services       Functional Status Assessment Patient has had a recent decline in their functional status and demonstrates the ability to make significant improvements in function in a reasonable and predictable amount of time.     Precautions / Restrictions Precautions Precautions: Fall;Other (comment) Precaution/Restrictions Comments: pt is blind Restrictions Weight Bearing Restrictions Per Provider Order: No      Mobility  Bed Mobility Overal bed mobility: Needs Assistance Bed Mobility:  Supine to Sit, Sit to Supine     Supine to sit: Supervision, Contact guard Sit to supine: Contact guard assist, Min assist   General bed mobility comments: cues for direction, increased time but able to complete without assist to come to sit, assist to bring bil LEs on to bed    Transfers Overall transfer level: Needs assistance Equipment used: Rolling walker (2 wheels) Transfers: Sit to/from Stand Sit to Stand: Contact guard assist           General transfer comment: tactile cues for hand placement    Ambulation/Gait Ambulation/Gait assistance: Contact guard assist, Min assist Gait Distance (Feet): 65 Feet Assistive device: Rolling walker (2 wheels) Gait Pattern/deviations: Decreased step length - left, Decreased step length - right       General Gait Details: tactile and verbal cues for direction. assist to manage RW mostly d/t unfamiliar environment  Stairs            Wheelchair Mobility     Tilt Bed    Modified Rankin (Stroke Patients Only)       Balance Overall balance assessment: Needs assistance Sitting-balance support: No upper extremity supported, Feet supported Sitting balance-Leahy Scale: Good     Standing balance support: No upper extremity supported, During functional activity, Bilateral upper extremity supported Standing balance-Leahy Scale: Fair                               Pertinent Vitals/Pain Pain Assessment Pain Assessment: Faces Pain Location: knees (baseline) Pain Descriptors / Indicators: Aching Pain Intervention(s): Limited activity within patient's tolerance, Monitored during session, Repositioned  Home Living Family/patient expects to be discharged to:: Private residence Living Arrangements: Children (dtr) Available Help at Discharge: Family;Personal care attendant Type of Home: House Home Access: Stairs to enter   Entrance Stairs-Number of Steps: 2   Home Layout: One level Home Equipment: Clinical biochemist (2 wheels);Cane - single point;Tub bench Additional Comments: home setup from prior admission and per pt. per chart, pt with 12 hours caregiver assist per day    Prior Function Prior Level of Function : Needs assist             Mobility Comments: pt reports using RW outside of home, furniture surfs or holds on to family member inside house (no family available to verify)       Extremity/Trunk Assessment   Upper Extremity Assessment Upper Extremity Assessment: Defer to OT evaluation    Lower Extremity Assessment Lower Extremity Assessment: Overall WFL for tasks assessed       Communication   Communication Communication: No apparent difficulties    Cognition Arousal: Alert Behavior During Therapy: WFL for tasks assessed/performed   PT - Cognitive impairments: History of cognitive impairments                       PT - Cognition Comments: per chart-dementia Following commands: Intact       Cueing Cueing Techniques: Verbal cues, Tactile cues     General Comments      Exercises     Assessment/Plan    PT Assessment Patient needs continued PT services  PT Problem List Decreased strength;Decreased range of motion;Decreased activity tolerance;Decreased balance       PT Treatment Interventions DME instruction;Gait training;Functional mobility training;Therapeutic activities;Therapeutic exercise;Patient/family education    PT Goals (Current goals can be found in the Care Plan section)  Acute Rehab PT Goals PT Goal Formulation: With patient Time For Goal Achievement: 04/29/24 Potential to Achieve Goals: Good    Frequency Min 3X/week     Co-evaluation               AM-PAC PT 6 Clicks Mobility  Outcome Measure Help needed turning from your back to your side while in a flat bed without using bedrails?: A Little Help needed moving from lying on your back to sitting on the side of a flat bed without using bedrails?: A Little Help needed  moving to and from a bed to a chair (including a wheelchair)?: A Little Help needed standing up from a chair using your arms (e.g., wheelchair or bedside chair)?: A Little Help needed to walk in hospital room?: A Little Help needed climbing 3-5 steps with a railing? : A Little 6 Click Score: 18    End of Session Equipment Utilized During Treatment: Gait belt Activity Tolerance: Patient tolerated treatment well Patient left: with call bell/phone within reach;in bed;with nursing/sitter in room;with bed alarm set;Other (comment) (pt in mittens on arrival; left out of mittens with sitter, RN aware) Nurse Communication: Mobility status PT Visit Diagnosis: Unsteadiness on feet (R26.81);Other abnormalities of gait and mobility (R26.89)    Time: 8398-8384 PT Time Calculation (min) (ACUTE ONLY): 14 min   Charges:   PT Evaluation $PT Eval Low Complexity: 1 Low   PT General Charges $$ ACUTE PT VISIT: 1 Visit         Latonja Bobeck, PT  Acute Rehab Dept [facility]) [phone]  04/15/2024   [facility] 04/15/2024, 4:28 PM

## 2024-04-16 ENCOUNTER — Inpatient Hospital Stay (HOSPITAL_COMMUNITY)

## 2024-04-16 DIAGNOSIS — B9561 Methicillin susceptible Staphylococcus aureus infection as the cause of diseases classified elsewhere: Secondary | ICD-10-CM

## 2024-04-16 DIAGNOSIS — N390 Urinary tract infection, site not specified: Secondary | ICD-10-CM | POA: Diagnosis not present

## 2024-04-16 DIAGNOSIS — B9620 Unspecified Escherichia coli [E. coli] as the cause of diseases classified elsewhere: Secondary | ICD-10-CM

## 2024-04-16 DIAGNOSIS — R7881 Bacteremia: Secondary | ICD-10-CM | POA: Diagnosis not present

## 2024-04-16 DIAGNOSIS — N179 Acute kidney failure, unspecified: Secondary | ICD-10-CM | POA: Diagnosis not present

## 2024-04-16 DIAGNOSIS — W19XXXA Unspecified fall, initial encounter: Secondary | ICD-10-CM

## 2024-04-16 DIAGNOSIS — A419 Sepsis, unspecified organism: Secondary | ICD-10-CM | POA: Diagnosis not present

## 2024-04-16 DIAGNOSIS — Z1629 Resistance to other single specified antibiotic: Secondary | ICD-10-CM

## 2024-04-16 LAB — CULTURE, BLOOD (ROUTINE X 2)

## 2024-04-16 LAB — ECHOCARDIOGRAM COMPLETE
Area-P 1/2: 5.62 cm2
Height: 61 in
S' Lateral: 2.3 cm
Weight: 2645.52 [oz_av]

## 2024-04-16 LAB — URINE CULTURE: Culture: 40000 — AB

## 2024-04-16 LAB — CBC WITH DIFFERENTIAL/PLATELET
Abs Immature Granulocytes: 0.09 K/uL — ABNORMAL HIGH (ref 0.00–0.07)
Basophils Absolute: 0 K/uL (ref 0.0–0.1)
Basophils Relative: 0 %
Eosinophils Absolute: 0.1 K/uL (ref 0.0–0.5)
Eosinophils Relative: 1 %
HCT: 30.7 % — ABNORMAL LOW (ref 36.0–46.0)
Hemoglobin: 9.9 g/dL — ABNORMAL LOW (ref 12.0–15.0)
Immature Granulocytes: 1 %
Lymphocytes Relative: 19 %
Lymphs Abs: 2.5 K/uL (ref 0.7–4.0)
MCH: 23.1 pg — ABNORMAL LOW (ref 26.0–34.0)
MCHC: 32.2 g/dL (ref 30.0–36.0)
MCV: 71.7 fL — ABNORMAL LOW (ref 80.0–100.0)
Monocytes Absolute: 2.1 K/uL — ABNORMAL HIGH (ref 0.1–1.0)
Monocytes Relative: 16 %
Neutro Abs: 8.1 K/uL — ABNORMAL HIGH (ref 1.7–7.7)
Neutrophils Relative %: 63 %
Platelets: 173 K/uL (ref 150–400)
RBC: 4.28 MIL/uL (ref 3.87–5.11)
RDW: 15.8 % — ABNORMAL HIGH (ref 11.5–15.5)
WBC: 13 K/uL — ABNORMAL HIGH (ref 4.0–10.5)
nRBC: 0 % (ref 0.0–0.2)

## 2024-04-16 LAB — RENAL FUNCTION PANEL
Albumin: 2.9 g/dL — ABNORMAL LOW (ref 3.5–5.0)
Anion gap: 14 (ref 5–15)
BUN: 13 mg/dL (ref 8–23)
CO2: 19 mmol/L — ABNORMAL LOW (ref 22–32)
Calcium: 8.8 mg/dL — ABNORMAL LOW (ref 8.9–10.3)
Chloride: 104 mmol/L (ref 98–111)
Creatinine, Ser: 0.89 mg/dL (ref 0.44–1.00)
GFR, Estimated: >60 mL/min (ref >60)
Glucose, Bld: 90 mg/dL (ref 70–99)
Phosphorus: 2.1 mg/dL — ABNORMAL LOW (ref 2.5–4.6)
Potassium: 3 mmol/L — ABNORMAL LOW (ref 3.5–5.1)
Sodium: 137 mmol/L (ref 135–145)

## 2024-04-16 LAB — C DIFFICILE QUICK SCREEN W PCR REFLEX
C Diff antigen: NEGATIVE
C Diff interpretation: NOT DETECTED
C Diff toxin: NEGATIVE

## 2024-04-16 LAB — MAGNESIUM: Magnesium: 1.9 mg/dL (ref 1.7–2.4)

## 2024-04-16 MED ORDER — CEFAZOLIN SODIUM-DEXTROSE 2-4 GM/100ML-% IV SOLN
2.0000 g | Freq: Three times a day (TID) | INTRAVENOUS | Status: DC
  Administered 2024-04-16 – 2024-04-19 (×8): 2 g via INTRAVENOUS
  Filled 2024-04-16 (×8): qty 100

## 2024-04-16 MED ORDER — CIPROFLOXACIN HCL 500 MG PO TABS
500.0000 mg | ORAL_TABLET | Freq: Two times a day (BID) | ORAL | Status: DC
Start: 2024-04-17 — End: 2024-04-21
  Administered 2024-04-17 – 2024-04-19 (×5): 500 mg via ORAL
  Filled 2024-04-16 (×5): qty 1

## 2024-04-16 MED ORDER — GUAIFENESIN-DM 100-10 MG/5ML PO SYRP
5.0000 mL | ORAL_SOLUTION | Freq: Four times a day (QID) | ORAL | Status: DC
  Administered 2024-04-16 – 2024-04-19 (×13): 5 mL via ORAL
  Filled 2024-04-16 (×13): qty 10

## 2024-04-16 NOTE — Progress Notes (Signed)
 Date of Admission:  04/14/2024      ID: Jordan [name] is a 89 y.o. female Principal Problem:   Sepsis secondary to UTI [facility]) Active Problems:   Rheumatoid arthritis involving multiple sites with positive rheumatoid factor (HCC)   History of gastroesophageal reflux (GERD)   History of hypothyroidism   Essential hypertension   Acquired hypothyroidism   Alzheimer's disease (HCC)   Acute metabolic encephalopathy   Falls   AKI (acute kidney injury)    Subjective: Patient states she is doing better She has no specific complaints   Medications:   amLODipine   5 mg Oral Daily   aspirin  EC  81 mg Oral Daily   [START ON 04/17/2024] ciprofloxacin   500 mg Oral BID   guaiFENesin -dextromethorphan   5 mL Oral QID   heparin   5,000 Units Subcutaneous Q8H   levothyroxine   75 mcg Oral Q0600   simvastatin   20 mg Oral QPM    Objective: Vital signs in last 24 hours: Patient Vitals for the past 24 hrs:  BP Temp Temp src Pulse Resp SpO2 Weight  04/16/24 1359 (!) 180/86 98.7 F (37.1 C) Oral 94 18 98 % --  04/16/24 0500 (!) 148/96 98.5 F (36.9 C) Oral 96 16 98 % 75 kg  04/15/24 2300 137/62 98.2 F (36.8 C) Oral 90 -- 99 % --     PHYSICAL EXAM:  General: Alert Legally blind Head: Normocephalic, without obvious abnormality, atraumatic. Eyes: Conjunctivae clear, anicteric sclerae. Pupils are equal ENT Nares normal. No drainage or sinus tenderness. Lips, mucosa, and tongue normal. No Thrush Neck: Supple, symmetrical, no adenopathy, thyroid : non tender no carotid bruit and no JVD. Back: No CVA tenderness. Lungs: Clear to auscultation bilaterally. No Wheezing or Rhonchi. No rales. Heart: Regular rate and rhythm, no murmur, rub or gallop. Abdomen: Soft, non-tender,not distended. Bowel sounds normal. No masses Extremities: atraumatic, no cyanosis. No edema. No clubbing Skin: No rashes or lesions. Or bruising Lymph: Cervical, supraclavicular normal. Neurologic: Grossly non-focal  Lab  Results    Latest Ref Rng & Units 04/16/2024    5:45 AM 04/15/2024    4:49 AM 04/14/2024    4:15 AM  CBC  WBC 4.0 - 10.5 K/uL 13.0  16.4  20.1   Hemoglobin 12.0 - 15.0 g/dL 9.9  89.2  88.8   Hematocrit 36.0 - 46.0 % 30.7  31.4  34.0   Platelets 150 - 400 K/uL 173  165  190        Latest Ref Rng & Units 04/16/2024    5:45 AM 04/15/2024    4:49 AM 04/14/2024    4:15 AM  CMP  Glucose 70 - 99 mg/dL 90  83  896   BUN 8 - 23 mg/dL 13  19  29    Creatinine 0.44 - 1.00 mg/dL 9.10  8.86  8.33   Sodium 135 - 145 mmol/L 137  138  137   Potassium 3.5 - 5.1 mmol/L 3.0  3.6  3.9   Chloride 98 - 111 mmol/L 104  102  99   CO2 22 - 32 mmol/L 19  21  20    Calcium 8.9 - 10.3 mg/dL 8.8  9.1  9.6   Total Protein 6.5 - 8.1 g/dL   7.7   Total Bilirubin 0.0 - 1.2 mg/dL   1.3   Alkaline Phos 38 - 126 U/L   183   AST 15 - 41 U/L   51   ALT 0 - 44 U/L   28  Microbiology: Blood culture from 04/14/2024 drawn at 4:17 AM 1 set has E. coli in both aerobic and anaerobic bottles.  The second set was drawn at 9:18 AM and it has Staph aureus and anaerobic bottle only.  The urine culture drawn on 04/14/2024 at 8:18 AM has E. coli Studies/Results: ECHOCARDIOGRAM COMPLETE Result Date: 04/16/2024    ECHOCARDIOGRAM REPORT   Patient Name:   Jordan [name] Date of Exam: 04/16/2024 Medical Rec #:  [id number]        Height:       61.0 in Accession #:    [id number]       Weight:       165.3 lb Date of Birth:  [birth year]         BSA:          1.742 m Patient Age:    89 years         BP:           148/96 mmHg Patient Gender: F                HR:           80 bpm. Exam Location:  Inpatient Procedure: 2D Echo, Cardiac Doppler and Color Doppler (Both Spectral and Color            Flow Doppler were utilized during procedure). Indications:    Bacteremia  History:        Patient has prior history of Echocardiogram examinations, most                 recent 03/26/2023. Risk Factors:Hypertension.  Sonographer:    Philomena Daring Referring Phys:  [id number] DONALD BERLIN IMPRESSIONS  1. Left ventricular ejection fraction, by estimation, is 65 to 70%. The left ventricle has normal function. The left ventricle has no regional wall motion abnormalities. Left ventricular diastolic parameters are consistent with Grade II diastolic dysfunction (pseudonormalization). Elevated left atrial pressure.  2. Right ventricular systolic function is hyperdynamic. The right ventricular size is normal. There is normal pulmonary artery systolic pressure. The estimated right ventricular systolic pressure is 35.9 mmHg.  3. The mitral valve is normal in structure. No evidence of mitral valve regurgitation.  4. The aortic valve was not well visualized. Aortic valve regurgitation is not visualized. No aortic stenosis is present.  5. The inferior vena cava is normal in size with greater than 50% respiratory variability, suggesting right atrial pressure of 3 mmHg. Comparison(s): Prior images reviewed side by side. From 2021 study that is available, RVSP has slightly increased. Conclusion(s)/Recommendation(s): No evidence of valvular vegetations on this transthoracic echocardiogram. Consider a transesophageal echocardiogram to exclude infective endocarditis if clinically indicated. FINDINGS  Left Ventricle: Left ventricular ejection fraction, by estimation, is 65 to 70%. The left ventricle has normal function. The left ventricle has no regional wall motion abnormalities. The left ventricular internal cavity size was normal in size. Suboptimal image quality limits for assessment of left ventricular hypertrophy. Left ventricular diastolic parameters are consistent with Grade II diastolic dysfunction (pseudonormalization). Elevated left atrial pressure. Right Ventricle: The right ventricular size is normal. No increase in right ventricular wall thickness. Right ventricular systolic function is hyperdynamic. There is normal pulmonary artery systolic pressure. The tricuspid regurgitant  velocity is 2.87 m/s, and with an assumed right atrial pressure of 3 mmHg, the estimated right ventricular systolic pressure is 35.9 mmHg. Left Atrium: Left atrial size was normal in size. Right Atrium: Right atrial size was normal in size. Pericardium: There is no  evidence of pericardial effusion. Mitral Valve: The mitral valve is normal in structure. Mild mitral annular calcification. No evidence of mitral valve regurgitation. Tricuspid Valve: The tricuspid valve is normal in structure. Tricuspid valve regurgitation is mild . No evidence of tricuspid stenosis. Aortic Valve: The aortic valve was not well visualized. There is mild aortic valve annular calcification. Aortic valve regurgitation is not visualized. No aortic stenosis is present. Pulmonic Valve: The pulmonic valve was not well visualized. Pulmonic valve regurgitation is not visualized. No evidence of pulmonic stenosis. Aorta: The aortic root and ascending aorta are structurally normal, with no evidence of dilitation. Venous: The inferior vena cava is normal in size with greater than 50% respiratory variability, suggesting right atrial pressure of 3 mmHg. IAS/Shunts: The atrial septum is grossly normal.  LEFT VENTRICLE PLAX 2D LVIDd:         3.50 cm   Diastology LVIDs:         2.30 cm   LV e' medial:    6.31 cm/s LV PW:         1.00 cm   LV E/e' medial:  16.5 LV IVS:        1.10 cm   LV e' lateral:   6.53 cm/s LVOT diam:     1.80 cm   LV E/e' lateral: 15.9 LV SV:         72 LV SV Index:   41 LVOT Area:     2.54 cm  RIGHT VENTRICLE             IVC RV S prime:     13.50 cm/s  IVC diam: 1.60 cm TAPSE (M-mode): 2.2 cm LEFT ATRIUM           Index        RIGHT ATRIUM           Index LA diam:      3.40 cm 1.95 cm/m   RA Area:     12.40 cm LA Vol (A4C): 41.2 ml 23.65 ml/m  RA Volume:   27.40 ml  15.73 ml/m  AORTIC VALVE LVOT Vmax:   141.00 cm/s LVOT Vmean:  87.800 cm/s LVOT VTI:    0.282 m  AORTA Ao Root diam: 2.10 cm Ao Asc diam:  2.50 cm MITRAL VALVE                 TRICUSPID VALVE MV Area (PHT): 5.62 cm     TR Peak grad:   32.9 mmHg MV Decel Time: 135 msec     TR Vmax:        287.00 cm/s MV E velocity: 104.00 cm/s MV A velocity: 120.00 cm/s  SHUNTS MV E/A ratio:  0.87         Systemic VTI:  0.28 m                             Systemic Diam: 1.80 cm Stanly Leavens MD Electronically signed by Stanly Leavens MD Signature Date/Time: 04/16/2024/10:10:48 AM    Final    US  RENAL Result Date: 04/15/2024 EXAM: US  Retroperitoneum Complete, Renal. CLINICAL HISTORY: AKI (acute kidney injury) (HCC) 409830. AKI (acute kidney injury) (HCC). TECHNIQUE: Real-time ultrasound of the retroperitoneum (complete) with image documentation. COMPARISON: None provided. FINDINGS: RIGHT KIDNEY: The right kidney measures 10.7 x 3.8 x 4.3 cm with a volume of 91 cc. Renal cortical thickness and echogenicity is within normal limits. No hydronephrosis. No intrarenal mass or calcification.  Slightly limited evaluation of the upper pole of the right kidney. A right ureteral jet is identified. LEFT KIDNEY: The left kidney measures 9.2 x 5.0 x 4.2 cm with a volume of 99 cc. Renal cortical thickness and echogenicity is within normal limits. No hydronephrosis. No intrarenal mass or calcification. Slightly limited evaluation of the upper pole of the left kidney. The left ureteral jet is not clearly identified on this examination. BLADDER: The bladder is unremarkable. IMPRESSION: 1. No hydronephrosis. 2. Slightly limited evaluation of the upper poles of both kidneys. Electronically signed by: Dorethia Molt MD 04/15/2024 08:16 PM EDT RP Workstation: [id number]     Assessment/Plan:  Patient presenting after recurrent falls from home.  Encephalopathy has much improved She has got underlying mild dementia  E. coli bacteremia with E. coli UTI Renal imaging did not show any hydronephrosis or pyelonephritis or calculus Postvoid bladder scan was not significant it was around 100 The E. coli  is not susceptible to cefazolin - so started on cipro .  Staph aureus bacteremia the repeat blood culture had 1 bottle with Staph aureus.  Time to positivity around 18 hours Staph aureus in the blood cannot be considered a contaminant and it has to be treated like a pathogen There is no obvious source but she is immunocompromise taking Enbrel   injections weekly for rheumatoid arthritis- could the source be from injection sites  2D echo done showed normal mitral valve but whether she needs a TEE is debatable. Need to weigh the pros and cons of doing the test in a 89 year old female Patient is on cefazolin  for the Staph aureus. Discussed the management with her daughter Jerel ( she lives in [place] ) in great detail. We decided not to do TEE for 3 reasons- her age  low bioburden, no cardiac device and neg repeat blood culture Also Jerel does not want her to have a PICC line and get IV antibiotic at home She prefers she goes home- so will do Weekly dalbavancin- can give one dose here and will ask whether [facility] in home can do the 2nd dose or if she is here for a week then she will just need one dose here We will do surveillance culture in 2 weeks This is the plan as of now provided there is  no evolving joint or skin infections   Bilateral TKA no swelling or pain Anemia AKI improving   Dementia on memantine   This assessment involved complex antimicrobial management Discussed the management with her daughter in great detail

## 2024-04-16 NOTE — Hospital Course (Signed)
 Patient is an 89 year old female with past medical history significant for dementia, hypertension, hyperlipidemia and anxiety.  Patient was admitted with repeated falls and reported worse incoherence.   On presentation, temperature of 101 F was reported, patient was tachycardic with WBC of 20.1 and serum creatinine of 1.66.  CT head was unremarkable.  Chest x-ray was negative.  Blood and urine cultures are growing E. coli.  Blood cultures also growing MSSA.  Patient is currently on IV Rocephin .     04/15/2024: Serum creatinine has improved from 1.66-1.13.  WBC has improved from 20.1-16.4.  Hemoglobin of 10.7 g/dL noted.  Continue IV fluid.  Renal ultrasound.  Patient may end up needing CT scan of the abdomen and pelvis with contrast, however, we will hold off for now, considering recent acute kidney injury.  No significant history from patient.  Infectious disease input is highly appreciated.  Infectious diseases directing antibiotics management, and further workup.  As documented above, urine and blood cultures have grown E. coli.  Blood cultures also growing MSSA.  Infectious diseases repeating blood culture.  Echocardiogram is planned.   Assessment & Plan:   Sepsis secondary to UTI E. Coli bacteremia - Presented with fever, tachycardia, leukocytosis - Urine and blood cultures are growing E. coli. - 1 set of blood cultures from 9/21 growing 2/2 bottle of E. 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 - Improved significantly. - Continue to monitor renal function and electrolytes.     Hypertension - Norvasc    Hypothyroidism - Synthroid    Hyperlipidemia - Zocor 

## 2024-04-16 NOTE — Progress Notes (Signed)
 Progress Note    Jordan [name]   FMW:[id number]  DOB: 01/06/1934  DOA: 04/14/2024     2 PCP: [name], NP  Initial CC: Confusion, falls  Hospital Course: Patient is an 89 year old female with past medical history significant for dementia, hypertension, hyperlipidemia and anxiety.  Patient was admitted with repeated falls and reported worse incoherence.   On presentation, temperature of 101 F was reported, patient was tachycardic with WBC of 20.1 and serum creatinine of 1.66.  CT head was unremarkable.  Chest x-ray was negative.  Blood and urine cultures are growing E. coli.  Blood cultures also growing MSSA.  Patient is currently on IV Rocephin .     04/15/2024: Serum creatinine has improved from 1.66-1.13.  WBC has improved from 20.1-16.4.  Hemoglobin of 10.7 g/dL noted.  Continue IV fluid.  Renal ultrasound.  Patient may end up needing CT scan of the abdomen and pelvis with contrast, however, we will hold off for now, considering recent acute kidney injury.  No significant history from patient.  Infectious disease input is highly appreciated.  Infectious diseases directing antibiotics management, and further workup.  As documented above, urine and blood cultures have grown E. coli.  Blood cultures also growing MSSA.  Infectious diseases repeating blood culture.  Echocardiogram is planned.   Assessment & Plan:   Sepsis secondary to UTI E. Coli bacteremia - Presented with fever, tachycardia, leukocytosis - Urine and blood cultures are growing E. coli. - 1 set of blood cultures from 9/21 growing 2/2 bottle of E. Coli (suspected translocation from urinary source) - Renal ultrasound obtained, no hydronephrosis - continue rocephin ; likely 7 day course to complete   Blood culture contamination  - 1/4 bottles from 9/21 positive for MSSA; already has underlying infection with E. Coli - strongly suspect contamination given clinical picture  - ID auto consulted - TTE negative on  9/23 - defer to ID about more workup and abx regimen, but still don't think this is infectious  - Follow-up repeat blood cultures obtained on 04/16/2024   Acute metabolic encephalopathy with underlying dementia - In setting of above, monitor - Delirium precaution   Recurrent falls - PT OT   AKI - Baseline creatinine 1 - Likely prerenal. - Improved significantly. - Continue to monitor renal function and electrolytes.     Hypertension - Norvasc    Hypothyroidism - Synthroid    Hyperlipidemia - Zocor   Interval History:  No events overnight.  Resting comfortably in bed when seen this morning.  Pleasantly confused.   Old records reviewed in assessment of this patient  Antimicrobials: Aztreonam  04/14/2024 x 1 Flagyl  04/14/2024 x 1 Vancomycin  04/15/2024 x 1 Rocephin  04/14/2024 >> current  DVT prophylaxis:  heparin  injection 5,000 Units Start: 04/14/24 1715 SCDs Start: 04/14/24 0611   Code Status:   Code Status: Full Code  Mobility Assessment (Last 72 Hours)     Mobility Assessment     Row Name 04/16/24 1013 04/15/24 2300 04/15/24 1625 04/15/24 0816 04/15/24 0752   Does the patient have exclusion criteria? No - Perform mobility assessment No - Perform mobility assessment -- No - Perform mobility assessment --   What is the highest level of mobility based on the mobility assessment? Level 4 (Ambulates with assistance) - Balance while stepping forward/back - Complete Level 4 (Ambulates with assistance) - Balance while stepping forward/back - Complete Level 4 (Ambulates with assistance) - Balance while stepping forward/back - Complete Level 4 (Ambulates with assistance) - Balance while stepping forward/back - Complete Level  4 (Ambulates with assistance) - Balance while stepping forward/back - Complete   Is the above level different from baseline mobility prior to current illness? -- Yes - Recommend PT order -- Yes - Recommend PT order --    Row Name 04/14/24 2030 04/14/24 1624          Does the patient have exclusion criteria? No - Perform mobility assessment No - Perform mobility assessment      What is the highest level of mobility based on the mobility assessment? Level 3 (Stands with assistance) - Balance while standing  and cannot march in place Level 2 (Chairfast) - Balance while sitting on edge of bed and cannot stand      Is the above level different from baseline mobility prior to current illness? Yes - Recommend PT order --         Barriers to discharge: none Disposition Plan:  TBD HH orders placed:  Status is: Inpt  Objective: Blood pressure (!) 180/86, pulse 94, temperature 98.7 F (37.1 C), temperature source Oral, resp. rate 18, height 5' 1 (1.549 m), weight 75 kg, SpO2 98%.  Examination:  Physical Exam Constitutional:      Comments: Pleasantly demented resting in bed in no distress  HENT:     Head: Normocephalic and atraumatic.     Mouth/Throat:     Mouth: Mucous membranes are moist.  Eyes:     Extraocular Movements: Extraocular movements intact.  Cardiovascular:     Rate and Rhythm: Normal rate and regular rhythm.  Pulmonary:     Effort: Pulmonary effort is normal. No respiratory distress.     Breath sounds: Normal breath sounds. No wheezing.  Abdominal:     General: Bowel sounds are normal. There is no distension.     Palpations: Abdomen is soft.     Tenderness: There is no abdominal tenderness.  Musculoskeletal:        General: Normal range of motion.     Cervical back: Normal range of motion and neck supple.  Skin:    General: Skin is warm and dry.  Neurological:     Mental Status: Mental status is at baseline.  Psychiatric:        Mood and Affect: Mood normal.      Consultants:  ID  Procedures:    Data Reviewed: Results for orders placed or performed during the hospital encounter of 04/14/24 (from the past 24 hours)  Renal function panel     Status: Abnormal   Collection Time: 04/16/24  5:45 AM  Result Value Ref  Range   Sodium 137 135 - 145 mmol/L   Potassium 3.0 (L) 3.5 - 5.1 mmol/L   Chloride 104 98 - 111 mmol/L   CO2 19 (L) 22 - 32 mmol/L   Glucose, Bld 90 70 - 99 mg/dL   BUN 13 8 - 23 mg/dL   Creatinine, Ser 9.10 0.44 - 1.00 mg/dL   Calcium 8.8 (L) 8.9 - 10.3 mg/dL   Phosphorus 2.1 (L) 2.5 - 4.6 mg/dL   Albumin 2.9 (L) 3.5 - 5.0 g/dL   GFR, Estimated >39 >39 mL/min   Anion gap 14 5 - 15  Magnesium      Status: None   Collection Time: 04/16/24  5:45 AM  Result Value Ref Range   Magnesium  1.9 1.7 - 2.4 mg/dL  CBC with Differential/Platelet     Status: Abnormal   Collection Time: 04/16/24  5:45 AM  Result Value Ref Range   WBC 13.0 (H)  4.0 - 10.5 K/uL   RBC 4.28 3.87 - 5.11 MIL/uL   Hemoglobin 9.9 (L) 12.0 - 15.0 g/dL   HCT 69.2 (L) 63.9 - 53.9 %   MCV 71.7 (L) 80.0 - 100.0 fL   MCH 23.1 (L) 26.0 - 34.0 pg   MCHC 32.2 30.0 - 36.0 g/dL   RDW 84.1 (H) 88.4 - 84.4 %   Platelets 173 150 - 400 K/uL   nRBC 0.0 0.0 - 0.2 %   Neutrophils Relative % 63 %   Neutro Abs 8.1 (H) 1.7 - 7.7 K/uL   Lymphocytes Relative 19 %   Lymphs Abs 2.5 0.7 - 4.0 K/uL   Monocytes Relative 16 %   Monocytes Absolute 2.1 (H) 0.1 - 1.0 K/uL   Eosinophils Relative 1 %   Eosinophils Absolute 0.1 0.0 - 0.5 K/uL   Basophils Relative 0 %   Basophils Absolute 0.0 0.0 - 0.1 K/uL   Immature Granulocytes 1 %   Abs Immature Granulocytes 0.09 (H) 0.00 - 0.07 K/uL  Culture, blood (Routine X 2) w Reflex to ID Panel     Status: None (Preliminary result)   Collection Time: 04/16/24  5:45 AM   Specimen: BLOOD LEFT HAND  Result Value Ref Range   Specimen Description      BLOOD LEFT HAND Performed at [facility], [address] [place], [place] [address]    Special Requests      Blood Culture results may not be optimal due to an inadequate volume of blood received in culture bottles BOTTLES DRAWN AEROBIC ONLY Performed at [facility], [address] [place], [place] [address]    Culture  PENDING    Report Status PENDING   Culture, blood (Routine X 2) w Reflex to ID Panel     Status: None (Preliminary result)   Collection Time: 04/16/24  5:45 AM   Specimen: BLOOD RIGHT HAND  Result Value Ref Range   Specimen Description      BLOOD RIGHT HAND Performed at [facility], [address], [place], [place] [address]    Special Requests      Blood Culture results may not be optimal due to an inadequate volume of blood received in culture bottles BOTTLES DRAWN AEROBIC ONLY Performed at [facility], [address], [place], [place] [address]    Culture PENDING    Report Status PENDING     I have reviewed pertinent nursing notes, vitals, labs, and images as necessary. I have ordered labwork to follow up on as indicated.  I have reviewed the last notes from staff over past 24 hours. I have discussed patient's care plan and test results with nursing staff, CM/SW, and other staff as appropriate.  Time spent: Greater than 50% of the 55 minute visit was spent in counseling/coordination of care for the patient as laid out in the A&P.   LOS: 2 days   Alm Apo, MD [facility] 04/16/2024, 2:20 PM

## 2024-04-16 NOTE — Progress Notes (Incomplete)
Cefazolin

## 2024-04-17 ENCOUNTER — Telehealth (HOSPITAL_COMMUNITY): Payer: Self-pay | Admitting: Pharmacy Technician

## 2024-04-17 ENCOUNTER — Other Ambulatory Visit (HOSPITAL_COMMUNITY): Payer: Self-pay

## 2024-04-17 DIAGNOSIS — I10 Essential (primary) hypertension: Secondary | ICD-10-CM | POA: Diagnosis not present

## 2024-04-17 DIAGNOSIS — Z8719 Personal history of other diseases of the digestive system: Secondary | ICD-10-CM | POA: Diagnosis not present

## 2024-04-17 DIAGNOSIS — G9341 Metabolic encephalopathy: Secondary | ICD-10-CM | POA: Diagnosis not present

## 2024-04-17 DIAGNOSIS — R296 Repeated falls: Secondary | ICD-10-CM

## 2024-04-17 DIAGNOSIS — Z8639 Personal history of other endocrine, nutritional and metabolic disease: Secondary | ICD-10-CM

## 2024-04-17 DIAGNOSIS — E876 Hypokalemia: Secondary | ICD-10-CM

## 2024-04-17 DIAGNOSIS — B9561 Methicillin susceptible Staphylococcus aureus infection as the cause of diseases classified elsewhere: Secondary | ICD-10-CM

## 2024-04-17 DIAGNOSIS — B9620 Unspecified Escherichia coli [E. coli] as the cause of diseases classified elsewhere: Secondary | ICD-10-CM

## 2024-04-17 DIAGNOSIS — F0280 Dementia in other diseases classified elsewhere without behavioral disturbance: Secondary | ICD-10-CM

## 2024-04-17 DIAGNOSIS — A419 Sepsis, unspecified organism: Secondary | ICD-10-CM | POA: Diagnosis not present

## 2024-04-17 DIAGNOSIS — G309 Alzheimer's disease, unspecified: Secondary | ICD-10-CM

## 2024-04-17 LAB — RENAL FUNCTION PANEL
Albumin: 3.2 g/dL — ABNORMAL LOW (ref 3.5–5.0)
Anion gap: 14 (ref 5–15)
BUN: 7 mg/dL — ABNORMAL LOW (ref 8–23)
CO2: 22 mmol/L (ref 22–32)
Calcium: 9 mg/dL (ref 8.9–10.3)
Chloride: 102 mmol/L (ref 98–111)
Creatinine, Ser: 0.85 mg/dL (ref 0.44–1.00)
GFR, Estimated: >60 mL/min (ref >60)
Glucose, Bld: 110 mg/dL — ABNORMAL HIGH (ref 70–99)
Phosphorus: 2.1 mg/dL — ABNORMAL LOW (ref 2.5–4.6)
Potassium: 2.6 mmol/L — CL (ref 3.5–5.1)
Sodium: 137 mmol/L (ref 135–145)

## 2024-04-17 LAB — CULTURE, BLOOD (ROUTINE X 2): Special Requests: ADEQUATE

## 2024-04-17 LAB — CBC WITH DIFFERENTIAL/PLATELET
Abs Immature Granulocytes: 0.12 K/uL — ABNORMAL HIGH (ref 0.00–0.07)
Basophils Absolute: 0 K/uL (ref 0.0–0.1)
Basophils Relative: 0 %
Eosinophils Absolute: 0.1 K/uL (ref 0.0–0.5)
Eosinophils Relative: 1 %
HCT: 32.4 % — ABNORMAL LOW (ref 36.0–46.0)
Hemoglobin: 10.7 g/dL — ABNORMAL LOW (ref 12.0–15.0)
Immature Granulocytes: 1 %
Lymphocytes Relative: 22 %
Lymphs Abs: 2.3 K/uL (ref 0.7–4.0)
MCH: 23.3 pg — ABNORMAL LOW (ref 26.0–34.0)
MCHC: 33 g/dL (ref 30.0–36.0)
MCV: 70.4 fL — ABNORMAL LOW (ref 80.0–100.0)
Monocytes Absolute: 1.2 K/uL — ABNORMAL HIGH (ref 0.1–1.0)
Monocytes Relative: 12 %
Neutro Abs: 6.8 K/uL (ref 1.7–7.7)
Neutrophils Relative %: 64 %
Platelets: 212 K/uL (ref 150–400)
RBC: 4.6 MIL/uL (ref 3.87–5.11)
RDW: 15.6 % — ABNORMAL HIGH (ref 11.5–15.5)
WBC: 10.6 K/uL — ABNORMAL HIGH (ref 4.0–10.5)
nRBC: 0 % (ref 0.0–0.2)

## 2024-04-17 LAB — MAGNESIUM: Magnesium: 1.5 mg/dL — ABNORMAL LOW (ref 1.7–2.4)

## 2024-04-17 MED ORDER — TIMOLOL MALEATE 0.5 % OP SOLN
1.0000 [drp] | Freq: Two times a day (BID) | OPHTHALMIC | Status: DC
  Administered 2024-04-18 – 2024-04-19 (×3): 1 [drp] via OPHTHALMIC
  Filled 2024-04-17: qty 5

## 2024-04-17 MED ORDER — ESCITALOPRAM OXALATE 10 MG PO TABS
10.0000 mg | ORAL_TABLET | Freq: Every day | ORAL | Status: DC
  Administered 2024-04-18 – 2024-04-19 (×2): 10 mg via ORAL
  Filled 2024-04-17 (×2): qty 1

## 2024-04-17 MED ORDER — MAGNESIUM SULFATE 2 GM/50ML IV SOLN
2.0000 g | Freq: Once | INTRAVENOUS | Status: AC
  Administered 2024-04-17: 2 g via INTRAVENOUS
  Filled 2024-04-17: qty 50

## 2024-04-17 MED ORDER — BENZONATATE 100 MG PO CAPS
100.0000 mg | ORAL_CAPSULE | Freq: Once | ORAL | Status: AC
  Administered 2024-04-17: 100 mg via ORAL
  Filled 2024-04-17: qty 1

## 2024-04-17 MED ORDER — PANTOPRAZOLE SODIUM 40 MG PO TBEC
40.0000 mg | DELAYED_RELEASE_TABLET | Freq: Every day | ORAL | Status: DC
  Administered 2024-04-18 – 2024-04-19 (×2): 40 mg via ORAL
  Filled 2024-04-17 (×2): qty 1

## 2024-04-17 MED ORDER — K PHOS MONO-SOD PHOS DI & MONO 155-852-130 MG PO TABS
250.0000 mg | ORAL_TABLET | Freq: Two times a day (BID) | ORAL | Status: DC
  Administered 2024-04-17 – 2024-04-19 (×5): 250 mg via ORAL
  Filled 2024-04-17 (×5): qty 1

## 2024-04-17 MED ORDER — POTASSIUM CHLORIDE CRYS ER 20 MEQ PO TBCR
40.0000 meq | EXTENDED_RELEASE_TABLET | ORAL | Status: AC
  Administered 2024-04-17 (×2): 40 meq via ORAL
  Filled 2024-04-17 (×2): qty 2

## 2024-04-17 MED ORDER — LOPERAMIDE HCL 2 MG PO CAPS: 2.0000 mg | ORAL_CAPSULE | ORAL | Status: DC | PRN

## 2024-04-17 MED ORDER — ATROPINE SULFATE 1 % OP SOLN
1.0000 [drp] | Freq: Every morning | OPHTHALMIC | Status: AC
  Administered 2024-04-17: 1 [drp] via OPHTHALMIC
  Filled 2024-04-17: qty 2

## 2024-04-17 MED ORDER — FOLIC ACID 1 MG PO TABS
1.0000 mg | ORAL_TABLET | Freq: Every day | ORAL | Status: DC
  Administered 2024-04-18 – 2024-04-19 (×2): 1 mg via ORAL
  Filled 2024-04-17 (×2): qty 1

## 2024-04-17 NOTE — Telephone Encounter (Signed)
 Patient Product/process development scientist completed.    The patient is insured through [facility]. Patient has Medicare and is not eligible for a copay card, but may be able to apply for patient assistance or Medicare RX Payment Plan (Patient Must reach out to their plan, if eligible for payment plan), if available.    Ran test claim for linezolid 600 mg and the current 14 day co-pay is $0.00.   This test claim was processed through [facility]- copay amounts may vary at other pharmacies due to pharmacy/plan contracts, or as the patient moves through the different stages of their insurance plan.     Reyes Sharps, CPHT Pharmacy Technician III Certified Patient Advocate [facility] Patient Advocate Team Direct Number: [phone]  Fax: [phone]

## 2024-04-17 NOTE — Progress Notes (Signed)
 Physical Therapy Treatment Patient Details Name: Jordan [name] MRN: [id number] DOB: 08/22/1933 Today's Date: 04/17/2024   History of Present Illness Pt is an 89 y/o female presenting after falls at home and AMS. Found to have sepsis due to UTI, AKI and acute metabolic encephalopathy. PMH: dementia, anxiety, HTN, HLD, RA, CVA    PT Comments   Pt admitted with above diagnosis.  Pt currently with functional limitations due to the deficits listed below (see PT Problem List). PT arrived and pt seated on BSC in room. Nursing tech entered shortly following PT. Pt sitter present. Pt reporting needing assist with hygiene tasks and transfers, pt required CGA and specific cues for locating objects due to visual deficits and hand over hand A to place R UE on RW, CGA for SPT with RW BSC to recliner, pt required cues for retrograde stepping pattern feeling sitting surfaces on posterior distal B LE prior to sitting and reaching for arm rests, pt will benefit from ongoing reinforcement for safety and technique, gait training in hallway with RW, CGA to min A for RW management, cues for safety, posture and direction, pt increased gait tolerance to 85 feet today, pt indicated B LE fatigue and knee pain. Pt directed to recliner. Pt ed initiated on use of call bell and locating call button with elevated Braille tracking markings are located, pt having difficulty with return demonstration and sitter present to assist pt with communicating needs to nursing staff. Pt left in recliner, all needs in place and sitter present.   Pt will benefit from acute skilled PT to increase their independence and safety with mobility to allow discharge.      If plan is discharge home, recommend the following: A little help with walking and/or transfers;A little help with bathing/dressing/bathroom;Help with stairs or ramp for entrance;Assistance with cooking/housework;Direct supervision/assist for medications management;Assist for  transportation   Can travel by private vehicle        Equipment Recommendations  None recommended by PT    Recommendations for Other Services       Precautions / Restrictions Precautions Precautions: Fall;Other (comment) Precaution/Restrictions Comments: pt is blind Restrictions Weight Bearing Restrictions Per Provider Order: No     Mobility  Bed Mobility               General bed mobility comments: pt seated on BSC when PT arrived    Transfers Overall transfer level: Needs assistance Equipment used: Rolling walker (2 wheels) Transfers: Sit to/from Stand, Bed to chair/wheelchair/BSC Sit to Stand: Contact guard assist Stand pivot transfers: Contact guard assist         General transfer comment: tactile and verbal cues for AD and hand placement as well as backing up to feel sitting surface with B LEs prior to sitting    Ambulation/Gait Ambulation/Gait assistance: Contact guard assist, Min assist Gait Distance (Feet): 85 Feet Assistive device: Rolling walker (2 wheels) Gait Pattern/deviations: Decreased step length - left, Decreased step length - right Gait velocity: decreased     General Gait Details: tactile and verbal cues for safety and direction occational min A  to manage RW   Stairs             Wheelchair Mobility     Tilt Bed    Modified Rankin (Stroke Patients Only)       Balance Overall balance assessment: Needs assistance Sitting-balance support: No upper extremity supported, Feet supported Sitting balance-Leahy Scale: Good     Standing balance support: No upper extremity supported,  During functional activity, Bilateral upper extremity supported Standing balance-Leahy Scale: Fair                              Hotel manager: No apparent difficulties  Cognition Arousal: Alert Behavior During Therapy: WFL for tasks assessed/performed   PT - Cognitive impairments: History of cognitive  impairments                       PT - Cognition Comments: per chart-dementia Following commands: Intact      Cueing Cueing Techniques: Verbal cues, Tactile cues  Exercises      General Comments        Pertinent Vitals/Pain Pain Assessment Pain Assessment: Faces Faces Pain Scale: Hurts little more Breathing: normal Negative Vocalization: none Facial Expression: smiling or inexpressive Body Language: relaxed Consolability: no need to console PAINAD Score: 0 Pain Location: knees (baseline) Pain Descriptors / Indicators: Aching, Sore Pain Intervention(s): Limited activity within patient's tolerance, Monitored during session, Repositioned    Home Living                          Prior Function            PT Goals (current goals can now be found in the care plan section) Acute Rehab PT Goals PT Goal Formulation: With patient Time For Goal Achievement: 04/29/24 Potential to Achieve Goals: Good Progress towards PT goals: Progressing toward goals    Frequency    Min 3X/week      PT Plan      Co-evaluation              AM-PAC PT 6 Clicks Mobility   Outcome Measure  Help needed turning from your back to your side while in a flat bed without using bedrails?: A Little Help needed moving from lying on your back to sitting on the side of a flat bed without using bedrails?: A Little Help needed moving to and from a bed to a chair (including a wheelchair)?: A Little Help needed standing up from a chair using your arms (e.g., wheelchair or bedside chair)?: A Little Help needed to walk in hospital room?: A Little Help needed climbing 3-5 steps with a railing? : A Little 6 Click Score: 18    End of Session Equipment Utilized During Treatment: Gait belt Activity Tolerance: Patient tolerated treatment well Patient left: with call bell/phone within reach;in chair;with chair alarm set Community education officer) Nurse Communication: Mobility status PT  Visit Diagnosis: Unsteadiness on feet (R26.81);Other abnormalities of gait and mobility (R26.89)     Time: 8576-8550 PT Time Calculation (min) (ACUTE ONLY): 26 min  Charges:    $Gait Training: 8-22 mins $Therapeutic Activity: 8-22 mins PT General Charges $$ ACUTE PT VISIT: 1 Visit                     [place], PT Acute Rehab    [place] VEAR Drone 04/17/2024, 2:55 PM

## 2024-04-17 NOTE — Progress Notes (Signed)
 Date of Admission:  04/14/2024      ID: Jordan [name] is a 89 y.o. female Principal Problem:   Sepsis secondary to UTI [facility]) Active Problems:   Rheumatoid arthritis involving multiple sites with positive rheumatoid factor (HCC)   History of gastroesophageal reflux (GERD)   History of hypothyroidism   Essential hypertension   Acquired hypothyroidism   Alzheimer's disease (HCC)   Acute metabolic encephalopathy   Falls   AKI (acute kidney injury)   E coli bacteremia   Fall   MSSA bacteremia    Subjective: Pt is doing fine   Medications:   amLODipine   5 mg Oral Daily   aspirin  EC  81 mg Oral Daily   ciprofloxacin   500 mg Oral BID   guaiFENesin -dextromethorphan   5 mL Oral QID   heparin   5,000 Units Subcutaneous Q8H   levothyroxine   75 mcg Oral Q0600   simvastatin   20 mg Oral QPM    Objective: Vital signs in last 24 hours: Patient Vitals for the past 24 hrs:  BP Temp Temp src Pulse Resp SpO2  04/17/24 0622 (!) 157/88 -- -- (!) 106 -- --  04/17/24 0617 (!) 197/86 98.6 F (37 C) -- 77 16 99 %  04/16/24 2004 (!) 188/83 98.4 F (36.9 C) -- 79 16 99 %  04/16/24 1359 (!) 180/86 98.7 F (37.1 C) Oral 94 18 98 %     PHYSICAL EXAM:  General: Alert Legally blind Head: Normocephalic, without obvious abnormality, atraumatic. Eyes: Conjunctivae clear, anicteric sclerae. Pupils are equal ENT Nares normal. No drainage or sinus tenderness. Lips, mucosa, and tongue normal. No Thrush Neck: Supple, symmetrical, no adenopathy, thyroid : non tender no carotid bruit and no JVD. Back: No CVA tenderness. Lungs: Clear to auscultation bilaterally. No Wheezing or Rhonchi. No rales. Heart: Regular rate and rhythm, no murmur, rub or gallop. Abdomen: Soft, non-tender,not distended. Bowel sounds normal. No masses Extremities: atraumatic, no cyanosis. No edema. No clubbing No lesions on her thighs  Skin: No rashes or lesions. Or bruising Lymph: Cervical, supraclavicular  normal. Neurologic: Grossly non-focal  Lab Results    Latest Ref Rng & Units 04/17/2024   10:01 AM 04/16/2024    5:45 AM 04/15/2024    4:49 AM  CBC  WBC 4.0 - 10.5 K/uL 10.6  13.0  16.4   Hemoglobin 12.0 - 15.0 g/dL 89.2  9.9  89.2   Hematocrit 36.0 - 46.0 % 32.4  30.7  31.4   Platelets 150 - 400 K/uL 212  173  165        Latest Ref Rng & Units 04/16/2024    5:45 AM 04/15/2024    4:49 AM 04/14/2024    4:15 AM  CMP  Glucose 70 - 99 mg/dL 90  83  896   BUN 8 - 23 mg/dL 13  19  29    Creatinine 0.44 - 1.00 mg/dL 9.10  8.86  8.33   Sodium 135 - 145 mmol/L 137  138  137   Potassium 3.5 - 5.1 mmol/L 3.0  3.6  3.9   Chloride 98 - 111 mmol/L 104  102  99   CO2 22 - 32 mmol/L 19  21  20    Calcium 8.9 - 10.3 mg/dL 8.8  9.1  9.6   Total Protein 6.5 - 8.1 g/dL   7.7   Total Bilirubin 0.0 - 1.2 mg/dL   1.3   Alkaline Phos 38 - 126 U/L   183   AST 15 - 41 U/L  51   ALT 0 - 44 U/L   28       Microbiology: Blood culture from 04/14/2024 drawn at 4:17 AM 1 set has E. coli in both aerobic and anaerobic bottles.  The second set was drawn at 9:18 AM and it has Staph aureus and anaerobic bottle only.  The urine culture drawn on 04/14/2024 at 8:18 AM has E. coli Studies/Results: ECHOCARDIOGRAM COMPLETE Result Date: 04/16/2024    ECHOCARDIOGRAM REPORT   Patient Name:   Jordan [name] Date of Exam: 04/16/2024 Medical Rec #:  [id number]        Height:       61.0 in Accession #:    [id number]       Weight:       165.3 lb Date of Birth:  1933-09-07         BSA:          1.742 m Patient Age:    89 years         BP:           148/96 mmHg Patient Gender: F                HR:           80 bpm. Exam Location:  Inpatient Procedure: 2D Echo, Cardiac Doppler and Color Doppler (Both Spectral and Color            Flow Doppler were utilized during procedure). Indications:    Bacteremia  History:        Patient has prior history of Echocardiogram examinations, most                 recent 03/26/2023. Risk Factors:Hypertension.   Sonographer:    Philomena Daring Referring Phys: [id number] DONALD BERLIN IMPRESSIONS  1. Left ventricular ejection fraction, by estimation, is 65 to 70%. The left ventricle has normal function. The left ventricle has no regional wall motion abnormalities. Left ventricular diastolic parameters are consistent with Grade II diastolic dysfunction (pseudonormalization). Elevated left atrial pressure.  2. Right ventricular systolic function is hyperdynamic. The right ventricular size is normal. There is normal pulmonary artery systolic pressure. The estimated right ventricular systolic pressure is 35.9 mmHg.  3. The mitral valve is normal in structure. No evidence of mitral valve regurgitation.  4. The aortic valve was not well visualized. Aortic valve regurgitation is not visualized. No aortic stenosis is present.  5. The inferior vena cava is normal in size with greater than 50% respiratory variability, suggesting right atrial pressure of 3 mmHg. Comparison(s): Prior images reviewed side by side. From 2021 study that is available, RVSP has slightly increased. Conclusion(s)/Recommendation(s): No evidence of valvular vegetations on this transthoracic echocardiogram. Consider a transesophageal echocardiogram to exclude infective endocarditis if clinically indicated. FINDINGS  Left Ventricle: Left ventricular ejection fraction, by estimation, is 65 to 70%. The left ventricle has normal function. The left ventricle has no regional wall motion abnormalities. The left ventricular internal cavity size was normal in size. Suboptimal image quality limits for assessment of left ventricular hypertrophy. Left ventricular diastolic parameters are consistent with Grade II diastolic dysfunction (pseudonormalization). Elevated left atrial pressure. Right Ventricle: The right ventricular size is normal. No increase in right ventricular wall thickness. Right ventricular systolic function is hyperdynamic. There is normal pulmonary  artery systolic pressure. The tricuspid regurgitant velocity is 2.87 m/s, and with an assumed right atrial pressure of 3 mmHg, the estimated right ventricular systolic pressure is 35.9 mmHg. Left Atrium: Left atrial size  was normal in size. Right Atrium: Right atrial size was normal in size. Pericardium: There is no evidence of pericardial effusion. Mitral Valve: The mitral valve is normal in structure. Mild mitral annular calcification. No evidence of mitral valve regurgitation. Tricuspid Valve: The tricuspid valve is normal in structure. Tricuspid valve regurgitation is mild . No evidence of tricuspid stenosis. Aortic Valve: The aortic valve was not well visualized. There is mild aortic valve annular calcification. Aortic valve regurgitation is not visualized. No aortic stenosis is present. Pulmonic Valve: The pulmonic valve was not well visualized. Pulmonic valve regurgitation is not visualized. No evidence of pulmonic stenosis. Aorta: The aortic root and ascending aorta are structurally normal, with no evidence of dilitation. Venous: The inferior vena cava is normal in size with greater than 50% respiratory variability, suggesting right atrial pressure of 3 mmHg. IAS/Shunts: The atrial septum is grossly normal.  LEFT VENTRICLE PLAX 2D LVIDd:         3.50 cm   Diastology LVIDs:         2.30 cm   LV e' medial:    6.31 cm/s LV PW:         1.00 cm   LV E/e' medial:  16.5 LV IVS:        1.10 cm   LV e' lateral:   6.53 cm/s LVOT diam:     1.80 cm   LV E/e' lateral: 15.9 LV SV:         72 LV SV Index:   41 LVOT Area:     2.54 cm  RIGHT VENTRICLE             IVC RV S prime:     13.50 cm/s  IVC diam: 1.60 cm TAPSE (M-mode): 2.2 cm LEFT ATRIUM           Index        RIGHT ATRIUM           Index LA diam:      3.40 cm 1.95 cm/m   RA Area:     12.40 cm LA Vol (A4C): 41.2 ml 23.65 ml/m  RA Volume:   27.40 ml  15.73 ml/m  AORTIC VALVE LVOT Vmax:   141.00 cm/s LVOT Vmean:  87.800 cm/s LVOT VTI:    0.282 m  AORTA Ao Root  diam: 2.10 cm Ao Asc diam:  2.50 cm MITRAL VALVE                TRICUSPID VALVE MV Area (PHT): 5.62 cm     TR Peak grad:   32.9 mmHg MV Decel Time: 135 msec     TR Vmax:        287.00 cm/s MV E velocity: 104.00 cm/s MV A velocity: 120.00 cm/s  SHUNTS MV E/A ratio:  0.87         Systemic VTI:  0.28 m                             Systemic Diam: 1.80 cm Stanly Leavens MD Electronically signed by Stanly Leavens MD Signature Date/Time: 04/16/2024/10:10:48 AM    Final    US  RENAL Result Date: 04/15/2024 EXAM: US  Retroperitoneum Complete, Renal. CLINICAL HISTORY: AKI (acute kidney injury) (HCC) 409830. AKI (acute kidney injury) (HCC). TECHNIQUE: Real-time ultrasound of the retroperitoneum (complete) with image documentation. COMPARISON: None provided. FINDINGS: RIGHT KIDNEY: The right kidney measures 10.7 x 3.8 x 4.3 cm with a volume of 91  cc. Renal cortical thickness and echogenicity is within normal limits. No hydronephrosis. No intrarenal mass or calcification. Slightly limited evaluation of the upper pole of the right kidney. A right ureteral jet is identified. LEFT KIDNEY: The left kidney measures 9.2 x 5.0 x 4.2 cm with a volume of 99 cc. Renal cortical thickness and echogenicity is within normal limits. No hydronephrosis. No intrarenal mass or calcification. Slightly limited evaluation of the upper pole of the left kidney. The left ureteral jet is not clearly identified on this examination. BLADDER: The bladder is unremarkable. IMPRESSION: 1. No hydronephrosis. 2. Slightly limited evaluation of the upper poles of both kidneys. Electronically signed by: Dorethia Molt MD 04/15/2024 08:16 PM EDT RP Workstation: [id number]     Assessment/Plan:  Patient presenting after recurrent falls from home.  Encephalopathy has resolved She has got underlying mild dementia  E. coli bacteremia with E. coli UTI Renal imaging did not show any hydronephrosis or pyelonephritis or calculus Postvoid bladder scan  was not significant it was around 100 on cipro  for 7 days  Staph aureus bacteremia the repeat blood culture had 1 bottle with Staph aureus.  Time to positivity around 18 hours Staph aureus in the blood cannot be considered a contaminant and it has to be treated like a pathogen There is no obvious source but she is immunocompromise taking Enbrel   injections weekly for rheumatoid arthritis-  2D echo done showed normal mitral valve but whether she needs a TEE is debatable. Need to weigh the pros and cons of doing the test in a 89 year old female Patient is on cefazolin  for the Staph aureus. Discussed the management with her daughter Jerel ( she lives in [place] ) in great detail. We decided not to do TEE for 3 reasons- her age  low bioburden, no cardiac device and neg repeat blood culture Also Jerel does not want her to have a PICC line and get IV antibiotic at home She prefers she goes home- so will do Weekly dalbavancin- can give one dose tomorrow  and spoke to her PCP office . They can't do it at home- But Pam chnadler from [place] can do the infusion next week at home- she will come and check the patient tomorrow and decide We will do surveillance culture in 2 weeks   Bilateral TKA no swelling or pain Anemia AKI improving   Dementia on memantine   This assessment involved complex antimicrobial management Discussed the management with patient and ID pharmacist

## 2024-04-17 NOTE — Progress Notes (Signed)
 PROGRESS NOTE    Jordan [name]  FMW:[id number] DOB: 12-03-1933 DOA: 04/14/2024 PCP: Jordan Rosaline NOVAK, NP    Chief Complaint  Patient presents with   Fall    Brief Narrative:  Patient is an 89 year old female with past medical history significant for dementia, hypertension, hyperlipidemia and anxiety.  Patient was admitted with repeated falls and reported worse incoherence.    On presentation, temperature of 101 F was reported, patient was tachycardic with WBC of 20.1 and serum creatinine of 1.66.  CT head was unremarkable.  Chest x-ray was negative.  Blood and urine cultures are growing E. coli.  Blood cultures also growing MSSA.  Patient is currently on IV Rocephin .     04/15/2024: Serum creatinine has improved from 1.66-1.13.  WBC has improved from 20.1-16.4.  Hemoglobin of 10.7 g/dL noted.  Continue IV fluid.  Renal ultrasound.  Patient may end up needing CT scan of the abdomen and pelvis with contrast, however, we will hold off for now, considering recent acute kidney injury.  No significant history from patient.  Infectious disease input is highly appreciated.  Infectious diseases directing antibiotics management, and further workup.  As documented above, urine and blood cultures have grown E. coli.  Blood cultures also growing MSSA.  Infectious diseases repeating blood culture.  Echocardiogram is planned.   Assessment & Plan:   Principal Problem:   Sepsis secondary to UTI [facility]) Active Problems:   Rheumatoid arthritis involving multiple sites with positive rheumatoid factor (HCC)   History of gastroesophageal reflux (GERD)   History of hypothyroidism   Essential hypertension   Acquired hypothyroidism   Alzheimer's disease (HCC)   Acute metabolic encephalopathy   Falls   AKI (acute kidney injury)   E coli bacteremia   Fall   MSSA bacteremia   Hypomagnesemia   Hypokalemia   Hypophosphatemia  #1 sepsis secondary to UTI/E. coli bacteremia/ -Patient noted to have  presented fever, tachycardia, leukocytosis meeting criteria for sepsis with urinalysis concerning for UTI and blood cultures positive for E. coli bacteremia. - Urine and blood cultures growing E. coli. - 1 set of blood cultures from 04/14/2024 and 2 bottles growing E. coli suspected translocation from urinary source. - Renal ultrasound negative for hydronephrosis. - Was on IV Rocephin  and subsequently IV cefazolin  however noted to be resistant to ampicillin, intermediate to cefazolin  (blood cultures), and as such patient has been changed to ciprofloxacin  per ID recommendations.  2.  MSSA bacteremia -Repeat blood cultures had 1 bottle with Staph aureus. - Initially felt to be a contaminant with repeat blood cultures with no growth to date. - Patient seen by ID who feel Staph aureus in the blood cannot be considered a contaminant and is being treated as a pathogen with no obvious source. - Patient noted to be immunocompromised taking Enbrel  injections weekly for rheumatoid arthritis. - ID discussed with family on whether patient would benefit from a TEE and it was decided not to undergo a TEE due to patient's age, low bioburden, no cardiac evidence and negative repeat blood cultures.   -Continue IV Ancef . - Per ID note patient's daughter did not want her to have a PICC line and get IV antibiotics at home and as such ID recommending weekly dalbavancin and may receive 1 dose here and a second dose in a week.   - Per ID.  3.  Acute metabolic encephalopathy with underlying dementia -Likely secondary to problems #1 and 2.   - Patient with clinical improvement and close to baseline  per caretaker was at bedside. - Delirium precautions.  4.  Recurrent falls -PT/OT.  5.  Hypokalemia/hypophosphatemia/hypomagnesemia -Likely secondary to GI losses.   - Per RN patient noted to have multiple stools.   - C. difficile PCR negative.  - Magnesium  sulfate 2 g IV x 1. - K-Dur 40 mEq p.o. every 4 hours x 2  doses. - K-Phos 250 mg p.o. twice daily x 3 days. - Repeat labs in the AM.  6.  AKI -Felt likely secondary to prerenal azotemia. - Improved with hydration. - Saline lock IV fluids.  7.  Hypertension -Continue Norvasc  5 mg daily.  8.  Hypothyroidism -Continue Synthroid .  9.  Hyperlipidemia -Continue statin.    DVT prophylaxis: Heparin  Code Status: Full Family Communication: Updated patient, caregiver at bedside. Disposition: Home when clinically improved, and cleared by ID.  Status is: Inpatient Remains inpatient appropriate because: Severity of illness   Consultants:  ID: Dr.Ravishanker 04/15/2024  Procedures:  CT head/ CT C-spine 04/14/2024 Chest x-ray 04/14/2024 Plain films of the left hip and pelvis 04/14/2024 Renal ultrasound 04/15/2024 2D echo 04/16/2024   Antimicrobials: Anti-infectives (From admission, onward)    Start     Dose/Rate Route Frequency Ordered Stop   04/17/24 0800  ciprofloxacin  (CIPRO ) tablet 500 mg        500 mg Oral 2 times daily 04/16/24 1521 04/21/24 0759   04/17/24 0600  vancomycin  (VANCOREADY) IVPB 750 mg/150 mL  Status:  Discontinued        750 mg 150 mL/hr over 60 Minutes Intravenous Every 48 hours 04/15/24 0352 04/15/24 1255   04/16/24 2200  ceFAZolin  (ANCEF ) IVPB 2g/100 mL premix        2 g 200 mL/hr over 30 Minutes Intravenous Every 8 hours 04/16/24 1521     04/15/24 0600  cefTRIAXone  (ROCEPHIN ) 2 g in sodium chloride  0.9 % 100 mL IVPB  Status:  Discontinued        2 g 200 mL/hr over 30 Minutes Intravenous Every 24 hours 04/14/24 2245 04/16/24 1521   04/15/24 0430  vancomycin  (VANCOREADY) IVPB 1500 mg/300 mL        1,500 mg 150 mL/hr over 120 Minutes Intravenous  Once 04/15/24 0344 04/15/24 0617   04/14/24 0915  cefTRIAXone  (ROCEPHIN ) 1 g in sodium chloride  0.9 % 100 mL IVPB  Status:  Discontinued        1 g 200 mL/hr over 30 Minutes Intravenous Every 24 hours 04/14/24 0911 04/14/24 2245   04/14/24 0330  aztreonam  (AZACTAM ) 2 g in  sodium chloride  0.9 % 100 mL IVPB        2 g 200 mL/hr over 30 Minutes Intravenous  Once 04/14/24 0329 04/14/24 0715   04/14/24 0330  metroNIDAZOLE  (FLAGYL ) IVPB 500 mg        500 mg 100 mL/hr over 60 Minutes Intravenous  Once 04/14/24 0329 04/14/24 1034   04/14/24 0330  vancomycin  (VANCOCIN ) IVPB 1000 mg/200 mL premix  Status:  Discontinued        1,000 mg 200 mL/hr over 60 Minutes Intravenous  Once 04/14/24 0329 04/14/24 0911         Subjective: Sitting up in recliner.  Patient alert and oriented.  Denies any chest pain or shortness of breath.  No abdominal pain.  Overall feels well.  Tolerating current diet.  Caretaker at bedside.  Objective: Vitals:   04/17/24 0617 04/17/24 0622 04/17/24 1326 04/17/24 1939  BP: (!) 197/86 (!) 157/88 (!) 153/70 (!) 157/98  Pulse: 77 (!) 106  82 81  Resp: 16  18   Temp: 98.6 F (37 C)  97.7 F (36.5 C) 98.3 F (36.8 C)  TempSrc:   Oral Oral  SpO2: 99%  100% 97%  Weight:      Height:        Intake/Output Summary (Last 24 hours) at 04/17/2024 2104 Last data filed at 04/17/2024 1727 Gross per 24 hour  Intake 2375.86 ml  Output 2150 ml  Net 225.86 ml   Filed Weights   04/14/24 2047 04/15/24 0425 04/16/24 0500  Weight: 74 kg (P) 73 kg 75 kg    Examination:  General exam: Appears calm and comfortable  Respiratory system: Clear to auscultation. Respiratory effort normal. Cardiovascular system: S1 & S2 heard, RRR. No JVD, murmurs, rubs, gallops or clicks. No pedal edema. Gastrointestinal system: Abdomen is nondistended, soft and nontender. No organomegaly or masses felt. Normal bowel sounds heard. Central nervous system: Alert and oriented. No focal neurological deficits. Extremities: Symmetric 5 x 5 power. Skin: No rashes, lesions or ulcers Psychiatry: Judgement and insight appear normal. Mood & affect appropriate.     Data Reviewed: I have personally reviewed following labs and imaging studies  CBC: Recent Labs  Lab  04/14/24 0415 04/15/24 0449 04/16/24 0545 04/17/24 1001  WBC 20.1* 16.4* 13.0* 10.6*  NEUTROABS 15.4*  --  8.1* 6.8  HGB 11.1* 10.7* 9.9* 10.7*  HCT 34.0* 31.4* 30.7* 32.4*  MCV 71.4* 70.6* 71.7* 70.4*  PLT 190 165 173 212    Basic Metabolic Panel: Recent Labs  Lab 04/14/24 0415 04/15/24 0449 04/16/24 0545 04/17/24 1001  NA 137 138 137 137  K 3.9 3.6 3.0* 2.6*  CL 99 102 104 102  CO2 20* 21* 19* 22  GLUCOSE 103* 83 90 110*  BUN 29* 19 13 7*  CREATININE 1.66* 1.13* 0.89 0.85  CALCIUM 9.6 9.1 8.8* 9.0  MG  --   --  1.9 1.5*  PHOS  --   --  2.1* 2.1*    GFR: Estimated Creatinine Clearance: 42.4 mL/min (by C-G formula based on SCr of 0.85 mg/dL).  Liver Function Tests: Recent Labs  Lab 04/14/24 0415 04/16/24 0545 04/17/24 1001  AST 51*  --   --   ALT 28  --   --   ALKPHOS 183*  --   --   BILITOT 1.3*  --   --   PROT 7.7  --   --   ALBUMIN 3.7 2.9* 3.2*    CBG: No results for input(s): GLUCAP in the last 168 hours.   Recent Results (from the past 240 hours)  Resp panel by RT-PCR (RSV, Flu A&B, Covid) Anterior Nasal Swab     Status: None   Collection Time: 04/14/24  2:23 AM   Specimen: Anterior Nasal Swab  Result Value Ref Range Status   SARS Coronavirus 2 by RT PCR NEGATIVE NEGATIVE Final    Comment: (NOTE) SARS-CoV-2 target nucleic acids are NOT DETECTED.  The SARS-CoV-2 RNA is generally detectable in upper respiratory specimens during the acute phase of infection. The lowest concentration of SARS-CoV-2 viral copies this assay can detect is 138 copies/mL. A negative result does not preclude SARS-Cov-2 infection and should not be used as the sole basis for treatment or other patient management decisions. A negative result may occur with  improper specimen collection/handling, submission of specimen other than nasopharyngeal swab, presence of viral mutation(s) within the areas targeted by this assay, and inadequate number of viral copies(<138  copies/mL). A negative  result must be combined with clinical observations, patient history, and epidemiological information. The expected result is Negative.  Fact Sheet for Patients:  BloggerCourse.com  Fact Sheet for Healthcare Providers:  SeriousBroker.it  This test is no t yet approved or cleared by the [place]  FDA and  has been authorized for detection and/or diagnosis of SARS-CoV-2 by FDA under an Emergency Use Authorization (EUA). This EUA will remain  in effect (meaning this test can be used) for the duration of the COVID-19 declaration under Section 564(b)(1) of the Act, 21 U.S.C.section 360bbb-3(b)(1), unless the authorization is terminated  or revoked sooner.       Influenza A by PCR NEGATIVE NEGATIVE Final   Influenza B by PCR NEGATIVE NEGATIVE Final    Comment: (NOTE) The Xpert Xpress SARS-CoV-2/FLU/RSV plus assay is intended as an aid in the diagnosis of influenza from Nasopharyngeal swab specimens and should not be used as a sole basis for treatment. Nasal washings and aspirates are unacceptable for Xpert Xpress SARS-CoV-2/FLU/RSV testing.  Fact Sheet for Patients: BloggerCourse.com  Fact Sheet for Healthcare Providers: SeriousBroker.it  This test is not yet approved or cleared by the [place]  FDA and has been authorized for detection and/or diagnosis of SARS-CoV-2 by FDA under an Emergency Use Authorization (EUA). This EUA will remain in effect (meaning this test can be used) for the duration of the COVID-19 declaration under Section 564(b)(1) of the Act, 21 U.S.C. section 360bbb-3(b)(1), unless the authorization is terminated or revoked.     Resp Syncytial Virus by PCR NEGATIVE NEGATIVE Final    Comment: (NOTE) Fact Sheet for Patients: BloggerCourse.com  Fact Sheet for Healthcare  Providers: SeriousBroker.it  This test is not yet approved or cleared by the [place]  FDA and has been authorized for detection and/or diagnosis of SARS-CoV-2 by FDA under an Emergency Use Authorization (EUA). This EUA will remain in effect (meaning this test can be used) for the duration of the COVID-19 declaration under Section 564(b)(1) of the Act, 21 U.S.C. section 360bbb-3(b)(1), unless the authorization is terminated or revoked.  Performed at [facility], [address] [place], [place] [address]   Blood Culture (routine x 2)     Status: Abnormal   Collection Time: 04/14/24  4:17 AM   Specimen: BLOOD  Result Value Ref Range Status   Specimen Description   Final    BLOOD RIGHT ANTECUBITAL Performed at [facility], [address] [place], [place] [address]    Special Requests   Final    BOTTLES DRAWN AEROBIC AND ANAEROBIC Blood Culture results may not be optimal due to an inadequate volume of blood received in culture bottles Performed at [facility], [address] [place], [place] [address]    Culture  Setup Time   Final    GRAM NEGATIVE RODS IN BOTH AEROBIC AND ANAEROBIC BOTTLES CRITICAL RESULT CALLED TO, READ BACK BY AND VERIFIED WITH: PHARMD E JACKSON 04/14/2024 @ 2239 BY AB Performed at [facility], [address] [place], [place] [address]    Culture ESCHERICHIA COLI (A)  Final   Report Status 04/16/2024 FINAL  Final   Organism ID, Bacteria ESCHERICHIA COLI  Final      Susceptibility   Escherichia coli - MIC*    AMPICILLIN >=32 RESISTANT Resistant     CEFAZOLIN  (NON-URINE) 4 INTERMEDIATE Intermediate     CEFEPIME <=0.12 SENSITIVE Sensitive     ERTAPENEM <=0.12 SENSITIVE Sensitive     CEFTRIAXONE  <=0.25 SENSITIVE  Sensitive     CIPROFLOXACIN  <=0.06 SENSITIVE Sensitive     GENTAMICIN <=1 SENSITIVE Sensitive     MEROPENEM <=0.25 SENSITIVE Sensitive     TRIMETH/SULFA  <=20 SENSITIVE Sensitive     AMPICILLIN/SULBACTAM 16 INTERMEDIATE Intermediate     PIP/TAZO Value in next row Sensitive      <=4 SENSITIVEThis is a modified FDA-approved test that has been validated and its performance characteristics determined by the reporting laboratory.  This laboratory is certified under the Clinical Laboratory Improvement Amendments CLIA as qualified to perform high complexity clinical laboratory testing.    * ESCHERICHIA COLI  Blood Culture ID Panel (Reflexed)     Status: Abnormal   Collection Time: 04/14/24  4:17 AM  Result Value Ref Range Status   Enterococcus faecalis NOT DETECTED NOT DETECTED Final   Enterococcus Faecium NOT DETECTED NOT DETECTED Final   Listeria monocytogenes NOT DETECTED NOT DETECTED Final   Staphylococcus species NOT DETECTED NOT DETECTED Final   Staphylococcus aureus (BCID) NOT DETECTED NOT DETECTED Final   Staphylococcus epidermidis NOT DETECTED NOT DETECTED Final   Staphylococcus lugdunensis NOT DETECTED NOT DETECTED Final   Streptococcus species NOT DETECTED NOT DETECTED Final   Streptococcus agalactiae NOT DETECTED NOT DETECTED Final   Streptococcus pneumoniae NOT DETECTED NOT DETECTED Final   Streptococcus pyogenes NOT DETECTED NOT DETECTED Final   A.calcoaceticus-baumannii NOT DETECTED NOT DETECTED Final   Bacteroides fragilis NOT DETECTED NOT DETECTED Final   Enterobacterales DETECTED (A) NOT DETECTED Final    Comment: Enterobacterales represent a large order of gram negative bacteria, not a single organism. CRITICAL RESULT CALLED TO, READ BACK BY AND VERIFIED WITH: PHARMD E JACKSON 04/14/2024 @ 2239 BY AB    Enterobacter cloacae complex NOT DETECTED NOT DETECTED Final   Escherichia coli DETECTED (A) NOT DETECTED Final    Comment: CRITICAL RESULT CALLED TO, READ BACK BY AND VERIFIED WITH: PHARMD E JACKSON 04/14/2024 @ 2239 BY AB    Klebsiella aerogenes NOT DETECTED NOT DETECTED Final   Klebsiella oxytoca NOT DETECTED NOT DETECTED  Final   Klebsiella pneumoniae NOT DETECTED NOT DETECTED Final   Proteus species NOT DETECTED NOT DETECTED Final   Salmonella species NOT DETECTED NOT DETECTED Final   Serratia marcescens NOT DETECTED NOT DETECTED Final   Haemophilus influenzae NOT DETECTED NOT DETECTED Final   Neisseria meningitidis NOT DETECTED NOT DETECTED Final   Pseudomonas aeruginosa NOT DETECTED NOT DETECTED Final   Stenotrophomonas maltophilia NOT DETECTED NOT DETECTED Final   Candida albicans NOT DETECTED NOT DETECTED Final   Candida auris NOT DETECTED NOT DETECTED Final   Candida glabrata NOT DETECTED NOT DETECTED Final   Candida krusei NOT DETECTED NOT DETECTED Final   Candida parapsilosis NOT DETECTED NOT DETECTED Final   Candida tropicalis NOT DETECTED NOT DETECTED Final   Cryptococcus neoformans/gattii NOT DETECTED NOT DETECTED Final   CTX-M ESBL NOT DETECTED NOT DETECTED Final   Carbapenem resistance IMP NOT DETECTED NOT DETECTED Final   Carbapenem resistance KPC NOT DETECTED NOT DETECTED Final   Carbapenem resistance NDM NOT DETECTED NOT DETECTED Final   Carbapenem resist OXA 48 LIKE NOT DETECTED NOT DETECTED Final   Carbapenem resistance VIM NOT DETECTED NOT DETECTED Final    Comment: Performed at [facility], [address], [place], [place] [address]  Urine Culture     Status: Abnormal   Collection Time: 04/14/24  8:18 AM   Specimen: Urine, Random  Result Value Ref Range Status   Specimen Description   Final  URINE, RANDOM Performed at [facility], [address], [place], [place] [address]    Special Requests   Final    NONE Reflexed from [id number] Performed at [facility], [address], [place], [place] [address]    Culture 40,000 COLONIES/mL ESCHERICHIA COLI (A)  Final   Report Status 04/16/2024 FINAL  Final   Organism ID, Bacteria ESCHERICHIA COLI (A)  Final      Susceptibility   Escherichia coli - MIC*    AMPICILLIN >=32 RESISTANT Resistant      CEFAZOLIN  (URINE) Value in next row Sensitive      2 SENSITIVEThis is a modified FDA-approved test that has been validated and its performance characteristics determined by the reporting laboratory.  This laboratory is certified under the Clinical Laboratory Improvement Amendments CLIA as qualified to perform high complexity clinical laboratory testing.    CEFEPIME Value in next row Sensitive      2 SENSITIVEThis is a modified FDA-approved test that has been validated and its performance characteristics determined by the reporting laboratory.  This laboratory is certified under the Clinical Laboratory Improvement Amendments CLIA as qualified to perform high complexity clinical laboratory testing.    ERTAPENEM Value in next row Sensitive      2 SENSITIVEThis is a modified FDA-approved test that has been validated and its performance characteristics determined by the reporting laboratory.  This laboratory is certified under the Clinical Laboratory Improvement Amendments CLIA as qualified to perform high complexity clinical laboratory testing.    CEFTRIAXONE  Value in next row Sensitive      2 SENSITIVEThis is a modified FDA-approved test that has been validated and its performance characteristics determined by the reporting laboratory.  This laboratory is certified under the Clinical Laboratory Improvement Amendments CLIA as qualified to perform high complexity clinical laboratory testing.    CIPROFLOXACIN  Value in next row Sensitive      2 SENSITIVEThis is a modified FDA-approved test that has been validated and its performance characteristics determined by the reporting laboratory.  This laboratory is certified under the Clinical Laboratory Improvement Amendments CLIA as qualified to perform high complexity clinical laboratory testing.    GENTAMICIN Value in next row Sensitive      2 SENSITIVEThis is a modified FDA-approved test that has been validated and its performance characteristics determined by  the reporting laboratory.  This laboratory is certified under the Clinical Laboratory Improvement Amendments CLIA as qualified to perform high complexity clinical laboratory testing.    NITROFURANTOIN Value in next row Sensitive      2 SENSITIVEThis is a modified FDA-approved test that has been validated and its performance characteristics determined by the reporting laboratory.  This laboratory is certified under the Clinical Laboratory Improvement Amendments CLIA as qualified to perform high complexity clinical laboratory testing.    TRIMETH/SULFA Value in next row Sensitive      2 SENSITIVEThis is a modified FDA-approved test that has been validated and its performance characteristics determined by the reporting laboratory.  This laboratory is certified under the Clinical Laboratory Improvement Amendments CLIA as qualified to perform high complexity clinical laboratory testing.    AMPICILLIN/SULBACTAM Value in next row Intermediate      2 SENSITIVEThis is a modified FDA-approved test that has been validated and its performance characteristics determined by the reporting laboratory.  This laboratory is certified under the Clinical Laboratory Improvement Amendments CLIA as qualified to perform high complexity clinical laboratory testing.    PIP/TAZO Value in next row Sensitive      <=  4 SENSITIVEThis is a modified FDA-approved test that has been validated and its performance characteristics determined by the reporting laboratory.  This laboratory is certified under the Clinical Laboratory Improvement Amendments CLIA as qualified to perform high complexity clinical laboratory testing.    MEROPENEM Value in next row Sensitive      <=4 SENSITIVEThis is a modified FDA-approved test that has been validated and its performance characteristics determined by the reporting laboratory.  This laboratory is certified under the Clinical Laboratory Improvement Amendments CLIA as qualified to perform high complexity  clinical laboratory testing.    * 40,000 COLONIES/mL ESCHERICHIA COLI  Blood Culture (routine x 2)     Status: Abnormal   Collection Time: 04/14/24  9:18 AM   Specimen: BLOOD LEFT ARM  Result Value Ref Range Status   Specimen Description   Final    BLOOD LEFT ARM Performed at [facility], [address], [place], [place] [address]    Special Requests   Final    BOTTLES DRAWN AEROBIC AND ANAEROBIC Blood Culture adequate volume Performed at [facility], [address] [place], [place] [address]    Culture  Setup Time   Final    GRAM POSITIVE COCCI IN CLUSTERS ANAEROBIC BOTTLE ONLY CRITICAL RESULT CALLED TO, READ BACK BY AND VERIFIED WITH: PHARMD E JACKSON 04/15/2024 @ 0339 BY AB Performed at [facility], [address], [place], [place] [address]    Culture STAPHYLOCOCCUS AUREUS (A)  Final   Report Status 04/17/2024 FINAL  Final   Organism ID, Bacteria STAPHYLOCOCCUS AUREUS  Final      Susceptibility   Staphylococcus aureus - MIC*    CIPROFLOXACIN  <=0.5 SENSITIVE Sensitive     ERYTHROMYCIN >=8 RESISTANT Resistant     GENTAMICIN <=0.5 SENSITIVE Sensitive     OXACILLIN <=0.25 SENSITIVE Sensitive     TETRACYCLINE <=1 SENSITIVE Sensitive     VANCOMYCIN  <=0.5 SENSITIVE Sensitive     TRIMETH/SULFA <=10 SENSITIVE Sensitive     CLINDAMYCIN  RESISTANT Resistant     RIFAMPIN <=0.5 SENSITIVE Sensitive     Inducible Clindamycin  POSITIVE Resistant     LINEZOLID 2 SENSITIVE Sensitive     * STAPHYLOCOCCUS AUREUS  Blood Culture ID Panel (Reflexed)     Status: Abnormal   Collection Time: 04/14/24  9:18 AM  Result Value Ref Range Status   Enterococcus faecalis NOT DETECTED NOT DETECTED Final   Enterococcus Faecium NOT DETECTED NOT DETECTED Final   Listeria monocytogenes NOT DETECTED NOT DETECTED Final   Staphylococcus species DETECTED (A) NOT DETECTED Final    Comment: CRITICAL RESULT CALLED TO, READ BACK BY AND VERIFIED WITH: PHARMD E JACKSON 04/15/2024 @  0339 BY AB    Staphylococcus aureus (BCID) DETECTED (A) NOT DETECTED Final    Comment: CRITICAL RESULT CALLED TO, READ BACK BY AND VERIFIED WITH: PHARMD E JACKSON 04/15/2024 @ 0339 BY AB    Staphylococcus epidermidis NOT DETECTED NOT DETECTED Final   Staphylococcus lugdunensis NOT DETECTED NOT DETECTED Final   Streptococcus species NOT DETECTED NOT DETECTED Final   Streptococcus agalactiae NOT DETECTED NOT DETECTED Final   Streptococcus pneumoniae NOT DETECTED NOT DETECTED Final   Streptococcus pyogenes NOT DETECTED NOT DETECTED Final   A.calcoaceticus-baumannii NOT DETECTED NOT DETECTED Final   Bacteroides fragilis NOT DETECTED NOT DETECTED Final   Enterobacterales NOT DETECTED NOT DETECTED Final   Enterobacter cloacae complex NOT DETECTED NOT DETECTED Final   Escherichia coli NOT DETECTED NOT DETECTED Final   Klebsiella aerogenes  NOT DETECTED NOT DETECTED Final   Klebsiella oxytoca NOT DETECTED NOT DETECTED Final   Klebsiella pneumoniae NOT DETECTED NOT DETECTED Final   Proteus species NOT DETECTED NOT DETECTED Final   Salmonella species NOT DETECTED NOT DETECTED Final   Serratia marcescens NOT DETECTED NOT DETECTED Final   Haemophilus influenzae NOT DETECTED NOT DETECTED Final   Neisseria meningitidis NOT DETECTED NOT DETECTED Final   Pseudomonas aeruginosa NOT DETECTED NOT DETECTED Final   Stenotrophomonas maltophilia NOT DETECTED NOT DETECTED Final   Candida albicans NOT DETECTED NOT DETECTED Final   Candida auris NOT DETECTED NOT DETECTED Final   Candida glabrata NOT DETECTED NOT DETECTED Final   Candida krusei NOT DETECTED NOT DETECTED Final   Candida parapsilosis NOT DETECTED NOT DETECTED Final   Candida tropicalis NOT DETECTED NOT DETECTED Final   Cryptococcus neoformans/gattii NOT DETECTED NOT DETECTED Final   Meth resistant mecA/C and MREJ NOT DETECTED NOT DETECTED Final    Comment: Performed at [facility], [address] [place], [place] [address]  Culture,  blood (Routine X 2) w Reflex to ID Panel     Status: None (Preliminary result)   Collection Time: 04/16/24  5:45 AM   Specimen: BLOOD LEFT HAND  Result Value Ref Range Status   Specimen Description   Final    BLOOD LEFT HAND Performed at [facility], [address], [place], [place] [address]    Special Requests   Final    Blood Culture results may not be optimal due to an inadequate volume of blood received in culture bottles BOTTLES DRAWN AEROBIC ONLY Performed at [facility], [address], [place], [place] [address]    Culture   Final    NO GROWTH < 24 HOURS Performed at [facility], [address] [place], [place] [address]    Report Status PENDING  Incomplete  Culture, blood (Routine X 2) w Reflex to ID Panel     Status: None (Preliminary result)   Collection Time: 04/16/24  5:45 AM   Specimen: BLOOD RIGHT HAND  Result Value Ref Range Status   Specimen Description   Final    BLOOD RIGHT HAND Performed at [facility], [address] [place], [place] [address]    Special Requests   Final    Blood Culture results may not be optimal due to an inadequate volume of blood received in culture bottles BOTTLES DRAWN AEROBIC ONLY Performed at [facility], [address] [place], [place] [address]    Culture   Final    NO GROWTH < 24 HOURS Performed at [facility], [address], [place], [place] [address]    Report Status PENDING  Incomplete  C Difficile Quick Screen w PCR reflex     Status: None   Collection Time: 04/16/24  4:49 PM   Specimen: STOOL  Result Value Ref Range Status   C Diff antigen NEGATIVE NEGATIVE Final   C Diff toxin NEGATIVE NEGATIVE Final   C Diff interpretation No C. difficile detected.  Final    Comment: Performed at [facility], [address], [place], [place] [address]         Radiology Studies: ECHOCARDIOGRAM COMPLETE Result Date: 04/16/2024     ECHOCARDIOGRAM REPORT   Patient Name:   DOYLENE SPLINTER Date of Exam: 04/16/2024 Medical Rec #:  [id number]        Height:  61.0 in Accession #:    [id number]       Weight:       165.3 lb Date of Birth:  Feb 08, 1934         BSA:          1.742 m Patient Age:    89 years         BP:           148/96 mmHg Patient Gender: F                HR:           80 bpm. Exam Location:  Inpatient Procedure: 2D Echo, Cardiac Doppler and Color Doppler (Both Spectral and Color            Flow Doppler were utilized during procedure). Indications:    Bacteremia  History:        Patient has prior history of Echocardiogram examinations, most                 recent 03/26/2023. Risk Factors:Hypertension.  Sonographer:    Philomena Daring Referring Phys: [id number] DONALD BERLIN IMPRESSIONS  1. Left ventricular ejection fraction, by estimation, is 65 to 70%. The left ventricle has normal function. The left ventricle has no regional wall motion abnormalities. Left ventricular diastolic parameters are consistent with Grade II diastolic dysfunction (pseudonormalization). Elevated left atrial pressure.  2. Right ventricular systolic function is hyperdynamic. The right ventricular size is normal. There is normal pulmonary artery systolic pressure. The estimated right ventricular systolic pressure is 35.9 mmHg.  3. The mitral valve is normal in structure. No evidence of mitral valve regurgitation.  4. The aortic valve was not well visualized. Aortic valve regurgitation is not visualized. No aortic stenosis is present.  5. The inferior vena cava is normal in size with greater than 50% respiratory variability, suggesting right atrial pressure of 3 mmHg. Comparison(s): Prior images reviewed side by side. From 2021 study that is available, RVSP has slightly increased. Conclusion(s)/Recommendation(s): No evidence of valvular vegetations on this transthoracic echocardiogram. Consider a transesophageal echocardiogram to exclude infective endocarditis  if clinically indicated. FINDINGS  Left Ventricle: Left ventricular ejection fraction, by estimation, is 65 to 70%. The left ventricle has normal function. The left ventricle has no regional wall motion abnormalities. The left ventricular internal cavity size was normal in size. Suboptimal image quality limits for assessment of left ventricular hypertrophy. Left ventricular diastolic parameters are consistent with Grade II diastolic dysfunction (pseudonormalization). Elevated left atrial pressure. Right Ventricle: The right ventricular size is normal. No increase in right ventricular wall thickness. Right ventricular systolic function is hyperdynamic. There is normal pulmonary artery systolic pressure. The tricuspid regurgitant velocity is 2.87 m/s, and with an assumed right atrial pressure of 3 mmHg, the estimated right ventricular systolic pressure is 35.9 mmHg. Left Atrium: Left atrial size was normal in size. Right Atrium: Right atrial size was normal in size. Pericardium: There is no evidence of pericardial effusion. Mitral Valve: The mitral valve is normal in structure. Mild mitral annular calcification. No evidence of mitral valve regurgitation. Tricuspid Valve: The tricuspid valve is normal in structure. Tricuspid valve regurgitation is mild . No evidence of tricuspid stenosis. Aortic Valve: The aortic valve was not well visualized. There is mild aortic valve annular calcification. Aortic valve regurgitation is not visualized. No aortic stenosis is present. Pulmonic Valve: The pulmonic valve was not well visualized. Pulmonic valve regurgitation is not visualized. No evidence of pulmonic stenosis. Aorta: The aortic root  and ascending aorta are structurally normal, with no evidence of dilitation. Venous: The inferior vena cava is normal in size with greater than 50% respiratory variability, suggesting right atrial pressure of 3 mmHg. IAS/Shunts: The atrial septum is grossly normal.  LEFT VENTRICLE PLAX 2D  LVIDd:         3.50 cm   Diastology LVIDs:         2.30 cm   LV e' medial:    6.31 cm/s LV PW:         1.00 cm   LV E/e' medial:  16.5 LV IVS:        1.10 cm   LV e' lateral:   6.53 cm/s LVOT diam:     1.80 cm   LV E/e' lateral: 15.9 LV SV:         72 LV SV Index:   41 LVOT Area:     2.54 cm  RIGHT VENTRICLE             IVC RV S prime:     13.50 cm/s  IVC diam: 1.60 cm TAPSE (M-mode): 2.2 cm LEFT ATRIUM           Index        RIGHT ATRIUM           Index LA diam:      3.40 cm 1.95 cm/m   RA Area:     12.40 cm LA Vol (A4C): 41.2 ml 23.65 ml/m  RA Volume:   27.40 ml  15.73 ml/m  AORTIC VALVE LVOT Vmax:   141.00 cm/s LVOT Vmean:  87.800 cm/s LVOT VTI:    0.282 m  AORTA Ao Root diam: 2.10 cm Ao Asc diam:  2.50 cm MITRAL VALVE                TRICUSPID VALVE MV Area (PHT): 5.62 cm     TR Peak grad:   32.9 mmHg MV Decel Time: 135 msec     TR Vmax:        287.00 cm/s MV E velocity: 104.00 cm/s MV A velocity: 120.00 cm/s  SHUNTS MV E/A ratio:  0.87         Systemic VTI:  0.28 m                             Systemic Diam: 1.80 cm Stanly Leavens MD Electronically signed by Stanly Leavens MD Signature Date/Time: 04/16/2024/10:10:48 AM    Final         Scheduled Meds:  amLODipine   5 mg Oral Daily   aspirin  EC  81 mg Oral Daily   ciprofloxacin   500 mg Oral BID   [START ON 04/18/2024] escitalopram   10 mg Oral q AM   [START ON 04/18/2024] folic acid   1 mg Oral q AM   guaiFENesin -dextromethorphan   5 mL Oral QID   heparin   5,000 Units Subcutaneous Q8H   levothyroxine   75 mcg Oral Q0600   pantoprazole   40 mg Oral Daily   phosphorus  250 mg Oral BID   simvastatin   20 mg Oral QPM   timolol   1 drop Both Eyes BID   Continuous Infusions:  sodium chloride  Stopped (04/17/24 1509)    ceFAZolin  (ANCEF ) IV 2 g (04/17/24 2101)     LOS: 3 days    Time spent: 40 minutes    Toribio Hummer, MD [facility]   To contact the attending provider between 7A-7P or  the covering provider during after  hours 7P-7A, please log into the web site [email or web address] and access using universal [place] password for that web site. If you do not have the password, please call the hospital operator.  04/17/2024, 9:04 PM

## 2024-04-18 DIAGNOSIS — Z8719 Personal history of other diseases of the digestive system: Secondary | ICD-10-CM | POA: Diagnosis not present

## 2024-04-18 DIAGNOSIS — A419 Sepsis, unspecified organism: Secondary | ICD-10-CM | POA: Diagnosis not present

## 2024-04-18 DIAGNOSIS — G9341 Metabolic encephalopathy: Secondary | ICD-10-CM | POA: Diagnosis not present

## 2024-04-18 DIAGNOSIS — I10 Essential (primary) hypertension: Secondary | ICD-10-CM | POA: Diagnosis not present

## 2024-04-18 LAB — CBC WITH DIFFERENTIAL/PLATELET
Abs Immature Granulocytes: 0.15 K/uL — ABNORMAL HIGH (ref 0.00–0.07)
Basophils Absolute: 0.1 K/uL (ref 0.0–0.1)
Basophils Relative: 1 %
Eosinophils Absolute: 0.1 K/uL (ref 0.0–0.5)
Eosinophils Relative: 1 %
HCT: 32.5 % — ABNORMAL LOW (ref 36.0–46.0)
Hemoglobin: 10.5 g/dL — ABNORMAL LOW (ref 12.0–15.0)
Immature Granulocytes: 1 %
Lymphocytes Relative: 25 %
Lymphs Abs: 3.1 K/uL (ref 0.7–4.0)
MCH: 23.3 pg — ABNORMAL LOW (ref 26.0–34.0)
MCHC: 32.3 g/dL (ref 30.0–36.0)
MCV: 72.2 fL — ABNORMAL LOW (ref 80.0–100.0)
Monocytes Absolute: 1.8 K/uL — ABNORMAL HIGH (ref 0.1–1.0)
Monocytes Relative: 14 %
Neutro Abs: 7.6 K/uL (ref 1.7–7.7)
Neutrophils Relative %: 58 %
Platelets: 238 K/uL (ref 150–400)
RBC: 4.5 MIL/uL (ref 3.87–5.11)
RDW: 15.9 % — ABNORMAL HIGH (ref 11.5–15.5)
WBC: 12.8 K/uL — ABNORMAL HIGH (ref 4.0–10.5)
nRBC: 0 % (ref 0.0–0.2)

## 2024-04-18 LAB — RENAL FUNCTION PANEL
Albumin: 3 g/dL — ABNORMAL LOW (ref 3.5–5.0)
Anion gap: 13 (ref 5–15)
BUN: 8 mg/dL (ref 8–23)
CO2: 20 mmol/L — ABNORMAL LOW (ref 22–32)
Calcium: 8.9 mg/dL (ref 8.9–10.3)
Chloride: 105 mmol/L (ref 98–111)
Creatinine, Ser: 0.82 mg/dL (ref 0.44–1.00)
GFR, Estimated: >60 mL/min (ref >60)
Glucose, Bld: 94 mg/dL (ref 70–99)
Phosphorus: 2.9 mg/dL (ref 2.5–4.6)
Potassium: 3.4 mmol/L — ABNORMAL LOW (ref 3.5–5.1)
Sodium: 138 mmol/L (ref 135–145)

## 2024-04-18 LAB — MAGNESIUM: Magnesium: 2 mg/dL (ref 1.7–2.4)

## 2024-04-18 MED ORDER — POTASSIUM CHLORIDE CRYS ER 20 MEQ PO TBCR
40.0000 meq | EXTENDED_RELEASE_TABLET | Freq: Once | ORAL | Status: AC
  Administered 2024-04-18: 40 meq via ORAL
  Filled 2024-04-18: qty 2

## 2024-04-18 NOTE — Progress Notes (Signed)
 Occupational Therapy Treatment Patient Details Name: Jordan [name] MRN: [id number] DOB: 1934-01-20 Today's Date: 04/18/2024   History of present illness Pt is an 89 y/o female presenting after falls at home and AMS. Found to have sepsis due to UTI, AKI and acute metabolic encephalopathy. PMH: dementia, anxiety, HTN, HLD, RA, CVA   OT comments  Pt resting in recliner comfortably, friend/fulltime caregiver in room. Pt eager to perform activity. Pt CGA with RW to ambulate to toilet, verbal cueing for direction. Pt physically able to wipe but assisted due to being blind. Pt min A to don new brief, able to stand unsupported and complete LB dressing. Pt ambulated 100 feet with verbal/physical cueing for direction, tolerated well. Pt back to recliner, all needs met, will continue to see acutely to maximize activity tolerance and OOB activity as able.       If plan is discharge home, recommend the following:  A little help with walking and/or transfers;A little help with bathing/dressing/bathroom;Assistance with cooking/housework;Direct supervision/assist for medications management;Direct supervision/assist for financial management;Assist for transportation;Help with stairs or ramp for entrance   Equipment Recommendations  None recommended by OT    Recommendations for Other Services      Precautions / Restrictions Precautions Precautions: Fall;Other (comment) Recall of Precautions/Restrictions: Intact Precaution/Restrictions Comments: pt is blind Restrictions Weight Bearing Restrictions Per Provider Order: No       Mobility Bed Mobility Overal bed mobility: Needs Assistance             General bed mobility comments: recliner    Transfers Overall transfer level: Needs assistance Equipment used: Rolling walker (2 wheels) Transfers: Sit to/from Stand, Bed to chair/wheelchair/BSC Sit to Stand: Contact guard assist Stand pivot transfers: Contact guard assist         General  transfer comment: CGA with RW     Balance Overall balance assessment: Needs assistance Sitting-balance support: No upper extremity supported, Feet supported Sitting balance-Leahy Scale: Good     Standing balance support: No upper extremity supported, During functional activity Standing balance-Leahy Scale: Fair                             ADL either performed or assessed with clinical judgement   ADL Overall ADL's : Needs assistance/impaired                     Lower Body Dressing: Minimal assistance;Sit to/from stand   Toilet Transfer: Contact guard assist;Rolling walker (2 wheels)   Toileting- Clothing Manipulation and Hygiene: Minimal assistance;Sit to/from stand       Functional mobility during ADLs: Contact guard assist;Rolling walker (2 wheels) General ADL Comments: CGA for ambulation with RW, min A for LB dressing, fair overall balance. Able to wipe but due to being blind needs help    Extremity/Trunk Assessment Upper Extremity Assessment Upper Extremity Assessment: Overall WFL for tasks assessed            Vision       Perception     Praxis     Communication Communication Communication: No apparent difficulties   Cognition Arousal: Alert Behavior During Therapy: WFL for tasks assessed/performed Cognition: History of cognitive impairments             OT - Cognition Comments: fully participated, pleasant and conversational                 Following commands: Intact        Cueing  Cueing Techniques: Verbal cues, Tactile cues  Exercises      Shoulder Instructions       General Comments      Pertinent Vitals/ Pain       Pain Assessment Pain Assessment: No/denies pain  Home Living                                          Prior Functioning/Environment              Frequency  Min 2X/week        Progress Toward Goals  OT Goals(current goals can now be found in the care plan  section)  Progress towards OT goals: Progressing toward goals  Acute Rehab OT Goals Patient Stated Goal: to return home OT Goal Formulation: With patient Time For Goal Achievement: 04/29/24 Potential to Achieve Goals: Good ADL Goals Pt Will Perform Grooming: with set-up;standing Pt Will Transfer to Toilet: with supervision;ambulating Additional ADL Goal #1: Pt to verbalize at least 3 fall prevention strategies to implement at home  Plan      Co-evaluation                 AM-PAC OT 6 Clicks Daily Activity     Outcome Measure   Help from another person eating meals?: A Little Help from another person taking care of personal grooming?: A Little Help from another person toileting, which includes using toliet, bedpan, or urinal?: A Little Help from another person bathing (including washing, rinsing, drying)?: A Little Help from another person to put on and taking off regular upper body clothing?: A Little Help from another person to put on and taking off regular lower body clothing?: A Little 6 Click Score: 18    End of Session Equipment Utilized During Treatment: Gait belt;Rolling walker (2 wheels)  OT Visit Diagnosis: Low vision, both eyes (H54.2)   Activity Tolerance Patient tolerated treatment well   Patient Left in chair;with call bell/phone within reach;with family/visitor present   Nurse Communication Mobility status        Time: 1400-1425 OT Time Calculation (min): 25 min  Charges: OT General Charges $OT Visit: 1 Visit OT Treatments $Self Care/Home Management : 8-22 mins $Therapeutic Activity: 8-22 mins  Devlyn Retter, OTR/L   Emalea Mix R Juliano Mceachin 04/18/2024, 2:30 PM

## 2024-04-18 NOTE — Progress Notes (Signed)
   04/18/24 1158  ECG Monitoring  CV Strip Heart Rate 159  Cardiac Rhythm SVT;Other (Comment) (CALLED RN VICTORIA: 13bt SVT HR 137)   Provider notified via secure chat. No order recieved

## 2024-04-18 NOTE — Plan of Care (Signed)

## 2024-04-18 NOTE — Progress Notes (Signed)
 Date of Admission:  04/14/2024      ID: Jordan [name] is a 89 y.o. female Principal Problem:   Sepsis secondary to UTI [facility]) Active Problems:   Rheumatoid arthritis involving multiple sites with positive rheumatoid factor (HCC)   History of gastroesophageal reflux (GERD)   History of hypothyroidism   Essential hypertension   Acquired hypothyroidism   Alzheimer's disease (HCC)   Acute metabolic encephalopathy   Falls   AKI (acute kidney injury)   E coli bacteremia   Fall   MSSA bacteremia   Hypomagnesemia   Hypokalemia   Hypophosphatemia    Subjective: Pt has no specific complaints   Medications:   amLODipine   5 mg Oral Daily   aspirin  EC  81 mg Oral Daily   ciprofloxacin   500 mg Oral BID   escitalopram   10 mg Oral Daily   folic acid   1 mg Oral Daily   guaiFENesin -dextromethorphan   5 mL Oral QID   heparin   5,000 Units Subcutaneous Q8H   levothyroxine   75 mcg Oral Q0600   pantoprazole   40 mg Oral Daily   phosphorus  250 mg Oral BID   simvastatin   20 mg Oral QPM   timolol   1 drop Both Eyes BID    Objective: Vital signs in last 24 hours: Patient Vitals for the past 24 hrs:  BP Temp Temp src Pulse Resp SpO2  04/18/24 1322 (!) 160/74 98.3 F (36.8 C) Oral 99 18 98 %  04/18/24 0500 (!) 155/76 97.8 F (36.6 C) Oral 89 20 99 %  04/17/24 1939 (!) 157/98 98.3 F (36.8 C) Oral 81 -- 97 %     PHYSICAL EXAM:  General: Alert Legally blind Lungs: Clear to auscultation bilaterally. No Wheezing or Rhonchi. No rales. Heart: Regular rate and rhythm, no murmur, rub or gallop. Abdomen: Soft, non-tender,not distended. Bowel sounds normal. No masses Extremities: atraumatic, no cyanosis. No edema. No clubbing No lesions on her thighs  Skin: No rashes or lesions. Or bruising Lymph: Cervical, supraclavicular normal. Neurologic: Grossly non-focal  Lab Results    Latest Ref Rng & Units 04/18/2024    7:16 AM 04/17/2024   10:01 AM 04/16/2024    5:45 AM  CBC  WBC 4.0 -  10.5 K/uL 12.8  10.6  13.0   Hemoglobin 12.0 - 15.0 g/dL 89.4  89.2  9.9   Hematocrit 36.0 - 46.0 % 32.5  32.4  30.7   Platelets 150 - 400 K/uL 238  212  173        Latest Ref Rng & Units 04/18/2024    7:16 AM 04/17/2024   10:01 AM 04/16/2024    5:45 AM  CMP  Glucose 70 - 99 mg/dL 94  889  90   BUN 8 - 23 mg/dL 8  7  13    Creatinine 0.44 - 1.00 mg/dL 9.17  9.14  9.10   Sodium 135 - 145 mmol/L 138  137  137   Potassium 3.5 - 5.1 mmol/L 3.4  2.6  3.0   Chloride 98 - 111 mmol/L 105  102  104   CO2 22 - 32 mmol/L 20  22  19    Calcium 8.9 - 10.3 mg/dL 8.9  9.0  8.8       Microbiology: Blood culture from 04/14/2024 drawn at 4:17 AM 1 set has E. coli in both aerobic and anaerobic bottles.  The second set was drawn at 9:18 AM and it has Staph aureus and anaerobic bottle only.  The urine  culture taken on 04/14/2024 at 8:18 AM has E. Coli 04/16/24 BC X2 NG Studies/Results: No results found.    Assessment/Plan:  Patient presenting after recurrent falls from home.  Encephalopathy has resolved She has got underlying mild dementia  E. coli bacteremia with E. coli UTI Renal imaging did not show any hydronephrosis or pyelonephritis or calculus Postvoid bladder scan was not significant it was around 100 on cipro  for 7 days  Staph aureus bacteremia the repeat blood culture had 1 bottle with Staph aureus.  Time to positivity around 18 hours Staph aureus in the blood cannot be considered a contaminant and it has to be treated like a pathogen There is no obvious source but she is immunocompromise taking Enbrel   injections weekly for rheumatoid arthritis-  2D echo done showed normal mitral valve but whether she needs a TEE is debatable. Need to weigh the pros and cons of doing the test in a 89 year old female Patient is on cefazolin  for the Staph aureus. Discussed the management with her daughter Jerel ( she lives in [place] ) in great detail. We decided not to do TEE for 3 reasons- her age   low bioburden, no cardiac device and neg repeat blood culture Also Jerel does not want her to have a PICC line and get IV antibiotic at home She prefers she goes home- so will do Weekly dalbavancin- can give one dose tomorrow and 2nd dose after a week We will do surveillance culture in 2 weeks after completion of antibiotic   Bilateral TKA no swelling or pain Anemia AKI improving   Dementia on memantine   This assessment involved complex antimicrobial management Discussed the management with patient and ID pharmacist

## 2024-04-18 NOTE — Progress Notes (Signed)
 PROGRESS NOTE    Jordan [name]  FMW:[id number] DOB: February 19, 1934 DOA: 04/14/2024 PCP: [name], NP    Chief Complaint  Patient presents with   Fall    Brief Narrative:  Patient is an 89 year old female with past medical history significant for dementia, hypertension, hyperlipidemia and anxiety.  Patient was admitted with repeated falls and reported worse incoherence.    On presentation, temperature of 101 F was reported, patient was tachycardic with WBC of 20.1 and serum creatinine of 1.66.  CT head was unremarkable.  Chest x-ray was negative.  Blood and urine cultures are growing E. coli.  Blood cultures also growing MSSA.  Patient is currently on IV Rocephin .     04/15/2024: Serum creatinine has improved from 1.66-1.13.  WBC has improved from 20.1-16.4.  Hemoglobin of 10.7 g/dL noted.  Continue IV fluid.  Renal ultrasound.  Patient may end up needing CT scan of the abdomen and pelvis with contrast, however, we will hold off for now, considering recent acute kidney injury.  No significant history from patient.  Infectious disease input is highly appreciated.  Infectious diseases directing antibiotics management, and further workup.  As documented above, urine and blood cultures have grown E. coli.  Blood cultures also growing MSSA.  Infectious diseases repeating blood culture.  Echocardiogram is planned.   Assessment & Plan:   Principal Problem:   Sepsis secondary to UTI [facility]) Active Problems:   Rheumatoid arthritis involving multiple sites with positive rheumatoid factor (HCC)   History of gastroesophageal reflux (GERD)   History of hypothyroidism   Essential hypertension   Acquired hypothyroidism   Alzheimer's disease (HCC)   Acute metabolic encephalopathy   Falls   AKI (acute kidney injury)   E coli bacteremia   Fall   MSSA bacteremia   Hypomagnesemia   Hypokalemia   Hypophosphatemia  #1 sepsis secondary to UTI/E. coli bacteremia/ -Patient noted to have  presented fever, tachycardia, leukocytosis meeting criteria for sepsis with urinalysis concerning for UTI and blood cultures positive for E. coli bacteremia. - Urine and blood cultures growing E. coli. - 1 set of blood cultures from 04/14/2024 and 2 bottles growing E. coli suspected translocation from urinary source. - Renal ultrasound negative for hydronephrosis. - Was on IV Rocephin  and subsequently IV cefazolin  however noted to be resistant to ampicillin, intermediate to cefazolin  (blood cultures), and as such patient has been changed to ciprofloxacin  for 7 days per ID recommendations.  2.  MSSA bacteremia -Repeat blood cultures had 1 bottle with Staph aureus. - Initially felt to be a contaminant with repeat blood cultures with no growth to date. - Patient seen by ID who feel Staph aureus in the blood cannot be considered a contaminant and is being treated as a pathogen with no obvious source. - Patient noted to be immunocompromised taking Enbrel  injections weekly for rheumatoid arthritis. - ID discussed with family on whether patient would benefit from a TEE and it was decided not to undergo a TEE due to patient's age, low bioburden, no cardiac evidence and negative repeat blood cultures.   -Continue IV Ancef . - Per ID note patient's daughter did not want her to have a PICC line and get IV antibiotics at home and as such ID recommending weekly dalbavancin and may receive 1 dose here on day of discharge, and a second dose in a week.   - Per ID.  3.  Acute metabolic encephalopathy with underlying dementia -Likely secondary to problems #1 and 2.   - Patient  with clinical improvement and close to baseline per caretaker was at bedside. - Delirium precautions.  4.  Recurrent falls -PT/OT.  5.  Hypokalemia/hypophosphatemia/hypomagnesemia -Likely secondary to GI losses.   - Per RN patient noted to have multiple stools.   - C. difficile PCR negative.  - Potassium at 3.4 today, will replete with  K-Dur 40 mEq p.o. x 1.  -Magnesium  at 2.0.   - Phosphorus at 2.9.   - Continue K-Phos.  - Repeat labs in the AM.  6.  AKI -Felt likely secondary to prerenal azotemia. - Improved with hydration. - Saline lock IV fluids.  7.  Hypertension - Continue Norvasc  5 mg daily.   8.  Hypothyroidism - Synthroid .   9.  Hyperlipidemia - Statin.     DVT prophylaxis: Heparin  Code Status: Full Family Communication: Updated patient, caregiver at bedside, daughter Jerel Berber on speaker phone. Disposition: Home when clinically improved, and cleared by ID hopefully in the next 24 hours..  Status is: Inpatient Remains inpatient appropriate because: Severity of illness   Consultants:  ID: Dr.Ravishanker 04/15/2024  Procedures:  CT head/ CT C-spine 04/14/2024 Chest x-ray 04/14/2024 Plain films of the left hip and pelvis 04/14/2024 Renal ultrasound 04/15/2024 2D echo 04/16/2024   Antimicrobials: Anti-infectives (From admission, onward)    Start     Dose/Rate Route Frequency Ordered Stop   04/17/24 0800  ciprofloxacin  (CIPRO ) tablet 500 mg        500 mg Oral 2 times daily 04/16/24 1521 04/21/24 0759   04/17/24 0600  vancomycin  (VANCOREADY) IVPB 750 mg/150 mL  Status:  Discontinued        750 mg 150 mL/hr over 60 Minutes Intravenous Every 48 hours 04/15/24 0352 04/15/24 1255   04/16/24 2200  ceFAZolin  (ANCEF ) IVPB 2g/100 mL premix        2 g 200 mL/hr over 30 Minutes Intravenous Every 8 hours 04/16/24 1521     04/15/24 0600  cefTRIAXone  (ROCEPHIN ) 2 g in sodium chloride  0.9 % 100 mL IVPB  Status:  Discontinued        2 g 200 mL/hr over 30 Minutes Intravenous Every 24 hours 04/14/24 2245 04/16/24 1521   04/15/24 0430  vancomycin  (VANCOREADY) IVPB 1500 mg/300 mL        1,500 mg 150 mL/hr over 120 Minutes Intravenous  Once 04/15/24 0344 04/15/24 0617   04/14/24 0915  cefTRIAXone  (ROCEPHIN ) 1 g in sodium chloride  0.9 % 100 mL IVPB  Status:  Discontinued        1 g 200 mL/hr over 30 Minutes  Intravenous Every 24 hours 04/14/24 0911 04/14/24 2245   04/14/24 0330  aztreonam  (AZACTAM ) 2 g in sodium chloride  0.9 % 100 mL IVPB        2 g 200 mL/hr over 30 Minutes Intravenous  Once 04/14/24 0329 04/14/24 0715   04/14/24 0330  metroNIDAZOLE  (FLAGYL ) IVPB 500 mg        500 mg 100 mL/hr over 60 Minutes Intravenous  Once 04/14/24 0329 04/14/24 1034   04/14/24 0330  vancomycin  (VANCOCIN ) IVPB 1000 mg/200 mL premix  Status:  Discontinued        1,000 mg 200 mL/hr over 60 Minutes Intravenous  Once 04/14/24 0329 04/14/24 0911         Subjective: Patient sitting up in recliner sleeping but easily arousable.  Denies any chest pain or shortness of breath.  No abdominal pain.  Overall feels well.  Caretaker at bedside.  Daughter on speaker phone.  Objective: Vitals:   04/17/24 1326 04/17/24 1939 04/18/24 0500 04/18/24 1322  BP: (!) 153/70 (!) 157/98 (!) 155/76 (!) 160/74  Pulse: 82 81 89 99  Resp: 18  20 18   Temp: 97.7 F (36.5 C) 98.3 F (36.8 C) 97.8 F (36.6 C) 98.3 F (36.8 C)  TempSrc: Oral Oral Oral Oral  SpO2: 100% 97% 99% 98%  Weight:      Height:        Intake/Output Summary (Last 24 hours) at 04/18/2024 1432 Last data filed at 04/18/2024 1024 Gross per 24 hour  Intake 635.03 ml  Output 950 ml  Net -314.97 ml   Filed Weights   04/14/24 2047 04/15/24 0425 04/16/24 0500  Weight: 74 kg (P) 73 kg 75 kg    Examination:  General exam: NAD. Respiratory system: CTAB anterior lung fields.  No wheezes, no crackles, no rhonchi.  Fair air movement.  Speaking in full sentences.  Cardiovascular system: Regular rate rhythm no murmurs rubs or gallops.  No JVD.  No lower extremity edema. Gastrointestinal system: Abdomen is soft, nontender, nondistended, positive bowel sounds.  No rebound.  No guarding. Central nervous system: Alert and oriented. No focal neurological deficits. Extremities: Symmetric 5 x 5 power. Skin: No rashes, lesions or ulcers Psychiatry: Judgement and  insight appear normal. Mood & affect appropriate.     Data Reviewed: I have personally reviewed following labs and imaging studies  CBC: Recent Labs  Lab 04/14/24 0415 04/15/24 0449 04/16/24 0545 04/17/24 1001 04/18/24 0716  WBC 20.1* 16.4* 13.0* 10.6* 12.8*  NEUTROABS 15.4*  --  8.1* 6.8 7.6  HGB 11.1* 10.7* 9.9* 10.7* 10.5*  HCT 34.0* 31.4* 30.7* 32.4* 32.5*  MCV 71.4* 70.6* 71.7* 70.4* 72.2*  PLT 190 165 173 212 238    Basic Metabolic Panel: Recent Labs  Lab 04/14/24 0415 04/15/24 0449 04/16/24 0545 04/17/24 1001 04/18/24 0716  NA 137 138 137 137 138  K 3.9 3.6 3.0* 2.6* 3.4*  CL 99 102 104 102 105  CO2 20* 21* 19* 22 20*  GLUCOSE 103* 83 90 110* 94  BUN 29* 19 13 7* 8  CREATININE 1.66* 1.13* 0.89 0.85 0.82  CALCIUM 9.6 9.1 8.8* 9.0 8.9  MG  --   --  1.9 1.5* 2.0  PHOS  --   --  2.1* 2.1* 2.9    GFR: Estimated Creatinine Clearance: 43.9 mL/min (by C-G formula based on SCr of 0.82 mg/dL).  Liver Function Tests: Recent Labs  Lab 04/14/24 0415 04/16/24 0545 04/17/24 1001 04/18/24 0716  AST 51*  --   --   --   ALT 28  --   --   --   ALKPHOS 183*  --   --   --   BILITOT 1.3*  --   --   --   PROT 7.7  --   --   --   ALBUMIN 3.7 2.9* 3.2* 3.0*    CBG: No results for input(s): GLUCAP in the last 168 hours.   Recent Results (from the past 240 hours)  Resp panel by RT-PCR (RSV, Flu A&B, Covid) Anterior Nasal Swab     Status: None   Collection Time: 04/14/24  2:23 AM   Specimen: Anterior Nasal Swab  Result Value Ref Range Status   SARS Coronavirus 2 by RT PCR NEGATIVE NEGATIVE Final    Comment: (NOTE) SARS-CoV-2 target nucleic acids are NOT DETECTED.  The SARS-CoV-2 RNA is generally detectable in upper respiratory specimens during the acute phase  of infection. The lowest concentration of SARS-CoV-2 viral copies this assay can detect is 138 copies/mL. A negative result does not preclude SARS-Cov-2 infection and should not be used as the sole basis  for treatment or other patient management decisions. A negative result may occur with  improper specimen collection/handling, submission of specimen other than nasopharyngeal swab, presence of viral mutation(s) within the areas targeted by this assay, and inadequate number of viral copies(<138 copies/mL). A negative result must be combined with clinical observations, patient history, and epidemiological information. The expected result is Negative.  Fact Sheet for Patients:  BloggerCourse.com  Fact Sheet for Healthcare Providers:  SeriousBroker.it  This test is no t yet approved or cleared by the [place]  FDA and  has been authorized for detection and/or diagnosis of SARS-CoV-2 by FDA under an Emergency Use Authorization (EUA). This EUA will remain  in effect (meaning this test can be used) for the duration of the COVID-19 declaration under Section 564(b)(1) of the Act, 21 U.S.C.section 360bbb-3(b)(1), unless the authorization is terminated  or revoked sooner.       Influenza A by PCR NEGATIVE NEGATIVE Final   Influenza B by PCR NEGATIVE NEGATIVE Final    Comment: (NOTE) The Xpert Xpress SARS-CoV-2/FLU/RSV plus assay is intended as an aid in the diagnosis of influenza from Nasopharyngeal swab specimens and should not be used as a sole basis for treatment. Nasal washings and aspirates are unacceptable for Xpert Xpress SARS-CoV-2/FLU/RSV testing.  Fact Sheet for Patients: BloggerCourse.com  Fact Sheet for Healthcare Providers: SeriousBroker.it  This test is not yet approved or cleared by the [place]  FDA and has been authorized for detection and/or diagnosis of SARS-CoV-2 by FDA under an Emergency Use Authorization (EUA). This EUA will remain in effect (meaning this test can be used) for the duration of the COVID-19 declaration under Section 564(b)(1) of the Act, 21  U.S.C. section 360bbb-3(b)(1), unless the authorization is terminated or revoked.     Resp Syncytial Virus by PCR NEGATIVE NEGATIVE Final    Comment: (NOTE) Fact Sheet for Patients: BloggerCourse.com  Fact Sheet for Healthcare Providers: SeriousBroker.it  This test is not yet approved or cleared by the [place]  FDA and has been authorized for detection and/or diagnosis of SARS-CoV-2 by FDA under an Emergency Use Authorization (EUA). This EUA will remain in effect (meaning this test can be used) for the duration of the COVID-19 declaration under Section 564(b)(1) of the Act, 21 U.S.C. section 360bbb-3(b)(1), unless the authorization is terminated or revoked.  Performed at [facility], [address] [place], [place] [address]   Blood Culture (routine x 2)     Status: Abnormal   Collection Time: 04/14/24  4:17 AM   Specimen: BLOOD  Result Value Ref Range Status   Specimen Description   Final    BLOOD RIGHT ANTECUBITAL Performed at [facility], [address], [place], [place] [address]    Special Requests   Final    BOTTLES DRAWN AEROBIC AND ANAEROBIC Blood Culture results may not be optimal due to an inadequate volume of blood received in culture bottles Performed at [facility], [address], [place], [place] [address]    Culture  Setup Time   Final    GRAM NEGATIVE RODS IN BOTH AEROBIC AND ANAEROBIC BOTTLES CRITICAL RESULT CALLED TO, READ BACK BY AND VERIFIED WITH: PHARMD E JACKSON 04/14/2024 @ 2239 BY AB Performed at [facility], [address], [place], [place]  [address]    Culture ESCHERICHIA COLI (A)  Final   Report Status 04/16/2024 FINAL  Final   Organism ID, Bacteria ESCHERICHIA COLI  Final      Susceptibility   Escherichia coli - MIC*    AMPICILLIN >=32 RESISTANT Resistant     CEFAZOLIN  (NON-URINE) 4 INTERMEDIATE Intermediate      CEFEPIME <=0.12 SENSITIVE Sensitive     ERTAPENEM <=0.12 SENSITIVE Sensitive     CEFTRIAXONE  <=0.25 SENSITIVE Sensitive     CIPROFLOXACIN  <=0.06 SENSITIVE Sensitive     GENTAMICIN <=1 SENSITIVE Sensitive     MEROPENEM <=0.25 SENSITIVE Sensitive     TRIMETH/SULFA <=20 SENSITIVE Sensitive     AMPICILLIN/SULBACTAM 16 INTERMEDIATE Intermediate     PIP/TAZO Value in next row Sensitive      <=4 SENSITIVEThis is a modified FDA-approved test that has been validated and its performance characteristics determined by the reporting laboratory.  This laboratory is certified under the Clinical Laboratory Improvement Amendments CLIA as qualified to perform high complexity clinical laboratory testing.    * ESCHERICHIA COLI  Blood Culture ID Panel (Reflexed)     Status: Abnormal   Collection Time: 04/14/24  4:17 AM  Result Value Ref Range Status   Enterococcus faecalis NOT DETECTED NOT DETECTED Final   Enterococcus Faecium NOT DETECTED NOT DETECTED Final   Listeria monocytogenes NOT DETECTED NOT DETECTED Final   Staphylococcus species NOT DETECTED NOT DETECTED Final   Staphylococcus aureus (BCID) NOT DETECTED NOT DETECTED Final   Staphylococcus epidermidis NOT DETECTED NOT DETECTED Final   Staphylococcus lugdunensis NOT DETECTED NOT DETECTED Final   Streptococcus species NOT DETECTED NOT DETECTED Final   Streptococcus agalactiae NOT DETECTED NOT DETECTED Final   Streptococcus pneumoniae NOT DETECTED NOT DETECTED Final   Streptococcus pyogenes NOT DETECTED NOT DETECTED Final   A.calcoaceticus-baumannii NOT DETECTED NOT DETECTED Final   Bacteroides fragilis NOT DETECTED NOT DETECTED Final   Enterobacterales DETECTED (A) NOT DETECTED Final    Comment: Enterobacterales represent a large order of gram negative bacteria, not a single organism. CRITICAL RESULT CALLED TO, READ BACK BY AND VERIFIED WITH: PHARMD E JACKSON 04/14/2024 @ 2239 BY AB    Enterobacter cloacae complex NOT DETECTED NOT DETECTED Final    Escherichia coli DETECTED (A) NOT DETECTED Final    Comment: CRITICAL RESULT CALLED TO, READ BACK BY AND VERIFIED WITH: PHARMD E JACKSON 04/14/2024 @ 2239 BY AB    Klebsiella aerogenes NOT DETECTED NOT DETECTED Final   Klebsiella oxytoca NOT DETECTED NOT DETECTED Final   Klebsiella pneumoniae NOT DETECTED NOT DETECTED Final   Proteus species NOT DETECTED NOT DETECTED Final   Salmonella species NOT DETECTED NOT DETECTED Final   Serratia marcescens NOT DETECTED NOT DETECTED Final   Haemophilus influenzae NOT DETECTED NOT DETECTED Final   Neisseria meningitidis NOT DETECTED NOT DETECTED Final   Pseudomonas aeruginosa NOT DETECTED NOT DETECTED Final   Stenotrophomonas maltophilia NOT DETECTED NOT DETECTED Final   Candida albicans NOT DETECTED NOT DETECTED Final   Candida auris NOT DETECTED NOT DETECTED Final   Candida glabrata NOT DETECTED NOT DETECTED Final   Candida krusei NOT DETECTED NOT DETECTED Final   Candida parapsilosis NOT DETECTED NOT DETECTED Final   Candida tropicalis NOT DETECTED NOT DETECTED Final   Cryptococcus neoformans/gattii NOT DETECTED NOT DETECTED Final   CTX-M ESBL NOT DETECTED NOT DETECTED Final   Carbapenem resistance IMP NOT DETECTED NOT DETECTED Final   Carbapenem resistance KPC NOT DETECTED NOT DETECTED Final   Carbapenem resistance NDM NOT  DETECTED NOT DETECTED Final   Carbapenem resist OXA 48 LIKE NOT DETECTED NOT DETECTED Final   Carbapenem resistance VIM NOT DETECTED NOT DETECTED Final    Comment: Performed at [facility], [address], [place], [place] [address]  Urine Culture     Status: Abnormal   Collection Time: 04/14/24  8:18 AM   Specimen: Urine, Random  Result Value Ref Range Status   Specimen Description   Final    URINE, RANDOM Performed at [facility], [address], [place], [place] [address]    Special Requests   Final    NONE Reflexed from [phone] Performed at [facility], [address] [place], [place] [address]    Culture 40,000 COLONIES/mL ESCHERICHIA COLI (A)  Final   Report Status 04/16/2024 FINAL  Final   Organism ID, Bacteria ESCHERICHIA COLI (A)  Final      Susceptibility   Escherichia coli - MIC*    AMPICILLIN >=32 RESISTANT Resistant     CEFAZOLIN  (URINE) Value in next row Sensitive      2 SENSITIVEThis is a modified FDA-approved test that has been validated and its performance characteristics determined by the reporting laboratory.  This laboratory is certified under the Clinical Laboratory Improvement Amendments CLIA as qualified to perform high complexity clinical laboratory testing.    CEFEPIME Value in next row Sensitive      2 SENSITIVEThis is a modified FDA-approved test that has been validated and its performance characteristics determined by the reporting laboratory.  This laboratory is certified under the Clinical Laboratory Improvement Amendments CLIA as qualified to perform high complexity clinical laboratory testing.    ERTAPENEM Value in next row Sensitive      2 SENSITIVEThis is a modified FDA-approved test that has been validated and its performance characteristics determined by the reporting laboratory.  This laboratory is certified under the Clinical Laboratory Improvement Amendments CLIA as qualified to perform high complexity clinical laboratory testing.    CEFTRIAXONE  Value in next row Sensitive      2 SENSITIVEThis is a modified FDA-approved test that has been validated and its performance characteristics determined by the reporting laboratory.  This laboratory is certified under the Clinical Laboratory Improvement Amendments CLIA as qualified to perform high complexity clinical laboratory testing.    CIPROFLOXACIN  Value in next row Sensitive      2 SENSITIVEThis is a modified FDA-approved test that has been validated and its performance characteristics determined by the reporting laboratory.  This laboratory is certified under the Clinical  Laboratory Improvement Amendments CLIA as qualified to perform high complexity clinical laboratory testing.    GENTAMICIN Value in next row Sensitive      2 SENSITIVEThis is a modified FDA-approved test that has been validated and its performance characteristics determined by the reporting laboratory.  This laboratory is certified under the Clinical Laboratory Improvement Amendments CLIA as qualified to perform high complexity clinical laboratory testing.    NITROFURANTOIN Value in next row Sensitive      2 SENSITIVEThis is a modified FDA-approved test that has been validated and its performance characteristics determined by the reporting laboratory.  This laboratory is certified under the Clinical Laboratory Improvement Amendments CLIA as qualified to perform high complexity clinical laboratory testing.    TRIMETH/SULFA Value in next row Sensitive      2 SENSITIVEThis is a modified FDA-approved test that has been validated and its performance characteristics determined by the reporting laboratory.  This laboratory is certified  under the Clinical Laboratory Improvement Amendments CLIA as qualified to perform high complexity clinical laboratory testing.    AMPICILLIN/SULBACTAM Value in next row Intermediate      2 SENSITIVEThis is a modified FDA-approved test that has been validated and its performance characteristics determined by the reporting laboratory.  This laboratory is certified under the Clinical Laboratory Improvement Amendments CLIA as qualified to perform high complexity clinical laboratory testing.    PIP/TAZO Value in next row Sensitive      <=4 SENSITIVEThis is a modified FDA-approved test that has been validated and its performance characteristics determined by the reporting laboratory.  This laboratory is certified under the Clinical Laboratory Improvement Amendments CLIA as qualified to perform high complexity clinical laboratory testing.    MEROPENEM Value in next row Sensitive      <=4  SENSITIVEThis is a modified FDA-approved test that has been validated and its performance characteristics determined by the reporting laboratory.  This laboratory is certified under the Clinical Laboratory Improvement Amendments CLIA as qualified to perform high complexity clinical laboratory testing.    * 40,000 COLONIES/mL ESCHERICHIA COLI  Blood Culture (routine x 2)     Status: Abnormal   Collection Time: 04/14/24  9:18 AM   Specimen: BLOOD LEFT ARM  Result Value Ref Range Status   Specimen Description   Final    BLOOD LEFT ARM Performed at [facility], [address] [place], [place] [address]    Special Requests   Final    BOTTLES DRAWN AEROBIC AND ANAEROBIC Blood Culture adequate volume Performed at [facility], [address] [place], [place] [address]    Culture  Setup Time   Final    GRAM POSITIVE COCCI IN CLUSTERS ANAEROBIC BOTTLE ONLY CRITICAL RESULT CALLED TO, READ BACK BY AND VERIFIED WITH: PHARMD E JACKSON 04/15/2024 @ 0339 BY AB Performed at [facility], [address], [place], [place] [address]    Culture STAPHYLOCOCCUS AUREUS (A)  Final   Report Status 04/17/2024 FINAL  Final   Organism ID, Bacteria STAPHYLOCOCCUS AUREUS  Final      Susceptibility   Staphylococcus aureus - MIC*    CIPROFLOXACIN  <=0.5 SENSITIVE Sensitive     ERYTHROMYCIN >=8 RESISTANT Resistant     GENTAMICIN <=0.5 SENSITIVE Sensitive     OXACILLIN <=0.25 SENSITIVE Sensitive     TETRACYCLINE <=1 SENSITIVE Sensitive     VANCOMYCIN  <=0.5 SENSITIVE Sensitive     TRIMETH/SULFA <=10 SENSITIVE Sensitive     CLINDAMYCIN  RESISTANT Resistant     RIFAMPIN <=0.5 SENSITIVE Sensitive     Inducible Clindamycin  POSITIVE Resistant     LINEZOLID 2 SENSITIVE Sensitive     * STAPHYLOCOCCUS AUREUS  Blood Culture ID Panel (Reflexed)     Status: Abnormal   Collection Time: 04/14/24  9:18 AM  Result Value Ref Range Status   Enterococcus faecalis NOT DETECTED NOT DETECTED Final    Enterococcus Faecium NOT DETECTED NOT DETECTED Final   Listeria monocytogenes NOT DETECTED NOT DETECTED Final   Staphylococcus species DETECTED (A) NOT DETECTED Final    Comment: CRITICAL RESULT CALLED TO, READ BACK BY AND VERIFIED WITH: PHARMD E JACKSON 04/15/2024 @ 0339 BY AB    Staphylococcus aureus (BCID) DETECTED (A) NOT DETECTED Final    Comment: CRITICAL RESULT CALLED TO, READ BACK BY AND VERIFIED WITH: PHARMD E JACKSON 04/15/2024 @ 0339 BY AB    Staphylococcus epidermidis NOT DETECTED NOT DETECTED Final   Staphylococcus lugdunensis NOT DETECTED NOT DETECTED  Final   Streptococcus species NOT DETECTED NOT DETECTED Final   Streptococcus agalactiae NOT DETECTED NOT DETECTED Final   Streptococcus pneumoniae NOT DETECTED NOT DETECTED Final   Streptococcus pyogenes NOT DETECTED NOT DETECTED Final   A.calcoaceticus-baumannii NOT DETECTED NOT DETECTED Final   Bacteroides fragilis NOT DETECTED NOT DETECTED Final   Enterobacterales NOT DETECTED NOT DETECTED Final   Enterobacter cloacae complex NOT DETECTED NOT DETECTED Final   Escherichia coli NOT DETECTED NOT DETECTED Final   Klebsiella aerogenes NOT DETECTED NOT DETECTED Final   Klebsiella oxytoca NOT DETECTED NOT DETECTED Final   Klebsiella pneumoniae NOT DETECTED NOT DETECTED Final   Proteus species NOT DETECTED NOT DETECTED Final   Salmonella species NOT DETECTED NOT DETECTED Final   Serratia marcescens NOT DETECTED NOT DETECTED Final   Haemophilus influenzae NOT DETECTED NOT DETECTED Final   Neisseria meningitidis NOT DETECTED NOT DETECTED Final   Pseudomonas aeruginosa NOT DETECTED NOT DETECTED Final   Stenotrophomonas maltophilia NOT DETECTED NOT DETECTED Final   Candida albicans NOT DETECTED NOT DETECTED Final   Candida auris NOT DETECTED NOT DETECTED Final   Candida glabrata NOT DETECTED NOT DETECTED Final   Candida krusei NOT DETECTED NOT DETECTED Final   Candida parapsilosis NOT DETECTED NOT DETECTED Final   Candida  tropicalis NOT DETECTED NOT DETECTED Final   Cryptococcus neoformans/gattii NOT DETECTED NOT DETECTED Final   Meth resistant mecA/C and MREJ NOT DETECTED NOT DETECTED Final    Comment: Performed at [facility], [address] [place], [place] [address]  Culture, blood (Routine X 2) w Reflex to ID Panel     Status: None (Preliminary result)   Collection Time: 04/16/24  5:45 AM   Specimen: BLOOD LEFT HAND  Result Value Ref Range Status   Specimen Description   Final    BLOOD LEFT HAND Performed at [facility], [address], [place], [place] [address]    Special Requests   Final    Blood Culture results may not be optimal due to an inadequate volume of blood received in culture bottles BOTTLES DRAWN AEROBIC ONLY Performed at [facility], [address], [place], [place] [address]    Culture   Final    NO GROWTH 2 DAYS Performed at [facility], [address] [place], [place] [address]    Report Status PENDING  Incomplete  Culture, blood (Routine X 2) w Reflex to ID Panel     Status: None (Preliminary result)   Collection Time: 04/16/24  5:45 AM   Specimen: BLOOD RIGHT HAND  Result Value Ref Range Status   Specimen Description   Final    BLOOD RIGHT HAND Performed at [facility], [address] [place], [place] [address]    Special Requests   Final    Blood Culture results may not be optimal due to an inadequate volume of blood received in culture bottles BOTTLES DRAWN AEROBIC ONLY Performed at [facility], [address] [place], [place] [address]    Culture   Final    NO GROWTH 2 DAYS Performed at [facility], [address], [place], [place] [address]    Report Status PENDING  Incomplete  C Difficile Quick Screen w PCR reflex     Status: None   Collection Time: 04/16/24  4:49 PM   Specimen: STOOL  Result Value Ref Range Status   C Diff antigen NEGATIVE NEGATIVE Final   C Diff  toxin NEGATIVE  NEGATIVE Final   C Diff interpretation No C. difficile detected.  Final    Comment: Performed at [facility], [address], [place], [place] [address]         Radiology Studies: No results found.       Scheduled Meds:  amLODipine   5 mg Oral Daily   aspirin  EC  81 mg Oral Daily   ciprofloxacin   500 mg Oral BID   escitalopram   10 mg Oral Daily   folic acid   1 mg Oral Daily   guaiFENesin -dextromethorphan   5 mL Oral QID   heparin   5,000 Units Subcutaneous Q8H   levothyroxine   75 mcg Oral Q0600   pantoprazole   40 mg Oral Daily   phosphorus  250 mg Oral BID   simvastatin   20 mg Oral QPM   timolol   1 drop Both Eyes BID   Continuous Infusions:   ceFAZolin  (ANCEF ) IV 2 g (04/18/24 1321)     LOS: 4 days    Time spent: 35 minutes    Toribio Hummer, MD [facility]   To contact the attending provider between 7A-7P or the covering provider during after hours 7P-7A, please log into the web site [email or web address] and access using universal [place] password for that web site. If you do not have the password, please call the hospital operator.  04/18/2024, 2:32 PM

## 2024-04-19 ENCOUNTER — Other Ambulatory Visit (HOSPITAL_COMMUNITY): Payer: Self-pay

## 2024-04-19 DIAGNOSIS — G9341 Metabolic encephalopathy: Secondary | ICD-10-CM | POA: Diagnosis not present

## 2024-04-19 DIAGNOSIS — E039 Hypothyroidism, unspecified: Secondary | ICD-10-CM

## 2024-04-19 DIAGNOSIS — Z8719 Personal history of other diseases of the digestive system: Secondary | ICD-10-CM | POA: Diagnosis not present

## 2024-04-19 DIAGNOSIS — I10 Essential (primary) hypertension: Secondary | ICD-10-CM | POA: Diagnosis not present

## 2024-04-19 DIAGNOSIS — M0579 Rheumatoid arthritis with rheumatoid factor of multiple sites without organ or systems involvement: Secondary | ICD-10-CM

## 2024-04-19 DIAGNOSIS — A419 Sepsis, unspecified organism: Secondary | ICD-10-CM | POA: Diagnosis not present

## 2024-04-19 LAB — CBC
HCT: 32 % — ABNORMAL LOW (ref 36.0–46.0)
Hemoglobin: 10.7 g/dL — ABNORMAL LOW (ref 12.0–15.0)
MCH: 23.9 pg — ABNORMAL LOW (ref 26.0–34.0)
MCHC: 33.4 g/dL (ref 30.0–36.0)
MCV: 71.4 fL — ABNORMAL LOW (ref 80.0–100.0)
Platelets: 251 K/uL (ref 150–400)
RBC: 4.48 MIL/uL (ref 3.87–5.11)
RDW: 15.9 % — ABNORMAL HIGH (ref 11.5–15.5)
WBC: 13.5 K/uL — ABNORMAL HIGH (ref 4.0–10.5)
nRBC: 0 % (ref 0.0–0.2)

## 2024-04-19 LAB — RENAL FUNCTION PANEL
Albumin: 3 g/dL — ABNORMAL LOW (ref 3.5–5.0)
Anion gap: 12 (ref 5–15)
BUN: 7 mg/dL — ABNORMAL LOW (ref 8–23)
CO2: 20 mmol/L — ABNORMAL LOW (ref 22–32)
Calcium: 9.1 mg/dL (ref 8.9–10.3)
Chloride: 105 mmol/L (ref 98–111)
Creatinine, Ser: 0.85 mg/dL (ref 0.44–1.00)
GFR, Estimated: >60 mL/min (ref >60)
Glucose, Bld: 84 mg/dL (ref 70–99)
Phosphorus: 3.1 mg/dL (ref 2.5–4.6)
Potassium: 3.9 mmol/L (ref 3.5–5.1)
Sodium: 138 mmol/L (ref 135–145)

## 2024-04-19 LAB — MAGNESIUM: Magnesium: 1.8 mg/dL (ref 1.7–2.4)

## 2024-04-19 MED ORDER — MAGNESIUM SULFATE 2 GM/50ML IV SOLN
2.0000 g | Freq: Once | INTRAVENOUS | Status: AC
  Administered 2024-04-19: 2 g via INTRAVENOUS
  Filled 2024-04-19: qty 50

## 2024-04-19 MED ORDER — DEXTROSE 5 % IV SOLN: 1500.0000 mg | Freq: Once | INTRAVENOUS | 0 refills | Status: AC

## 2024-04-19 MED ORDER — CIPROFLOXACIN HCL 500 MG PO TABS
500.0000 mg | ORAL_TABLET | Freq: Two times a day (BID) | ORAL | 0 refills | Status: AC
  Filled 2024-04-19: qty 4, 2d supply, fill #0

## 2024-04-19 MED ORDER — DEXTROSE 5 % IV SOLN
1500.0000 mg | Freq: Once | INTRAVENOUS | Status: AC
  Administered 2024-04-19: 1500 mg via INTRAVENOUS
  Filled 2024-04-19: qty 75

## 2024-04-19 NOTE — Progress Notes (Signed)
 Discharge instructions (including medications) discussed with and copy provided to patient/caregiver. Patient and caregiver given the opportunity to ask questions. Questions clarified.   Patient received meds to beds, as well as paper prescription for home health.

## 2024-04-19 NOTE — Progress Notes (Signed)
 PHARMACY CONSULT NOTE FOR:  OUTPATIENT  PARENTERAL ANTIBIOTIC THERAPY (OPAT)  Indication: MSSA bacteremia Regimen: Dalvance  1500 mg IV x 1 dose on Fri, 04/26/24 End date: 04/26/24  IV antibiotic discharge orders are pended. To discharging provider:  please sign these orders via discharge navigator,  Select New Orders & click on the button choice - Manage This Unsigned Work.     Thank you for allowing pharmacy to be a part of this patient's care.  Almarie Lunger, PharmD, BCPS, BCIDP Infectious Diseases Clinical Pharmacist 04/19/2024 12:25 PM   **Pharmacist phone directory can now be found on [email or web address] (PW [email or web address]).  Listed under [facility].

## 2024-04-19 NOTE — TOC Transition Note (Signed)
 Transition of Care [facility]) - Discharge Note   Patient Details  Name: Jordan [name] MRN: [id number] Date of Birth: [birth year]  [facility] CM/SW Contact:  Bascom Service, RN Phone Number: 04/19/2024, 3:00 PM   Clinical Narrative: d/c home w/family. Already set up for next Friday 1 dose iv abx-Pam w/[facility] already following. Has own transport home.Terry(dtr) agree to d/c plan.      Final next level of care: Home/Self Care Barriers to Discharge: No Barriers Identified   Patient Goals and CMS Choice Patient states their goals for this hospitalization and ongoing recovery are:: Home CMS Medicare.gov Compare Post Acute Care list provided to:: Patient Represenative (must comment) (Tyrell(grandson))        Discharge Placement                       Discharge Plan and Services Additional resources added to the After Visit Summary for     Discharge Planning Services: CM Consult                                 Social Drivers of Health (SDOH) Interventions SDOH Screenings   Food Insecurity: No Food Insecurity (04/21/2022)  Housing: Low Risk  (11/19/2020)  Transportation Needs: No Transportation Needs (04/21/2022)  Depression (PHQ2-9): Low Risk  (04/21/2022)  Financial Resource Strain: Low Risk  (04/21/2022)  Physical Activity: Inactive (04/21/2022)  Stress: No Stress Concern Present (04/21/2022)  Tobacco Use: Medium Risk (04/14/2024)     Readmission Risk Interventions     No data to display

## 2024-04-19 NOTE — Plan of Care (Signed)
  Problem: Education: Goal: Knowledge of General Education information will improve Description: Including pain rating scale, medication(s)/side effects and non-pharmacologic comfort measures Outcome: Adequate for Discharge   Problem: Health Behavior/Discharge Planning: Goal: Ability to manage health-related needs will improve Outcome: Adequate for Discharge   Problem: Clinical Measurements: Goal: Ability to maintain clinical measurements within normal limits will improve Outcome: Adequate for Discharge Goal: Will remain free from infection Outcome: Adequate for Discharge Goal: Diagnostic test results will improve Outcome: Adequate for Discharge Goal: Respiratory complications will improve Outcome: Adequate for Discharge Goal: Cardiovascular complication will be avoided Outcome: Adequate for Discharge   Problem: Activity: Goal: Risk for activity intolerance will decrease Outcome: Adequate for Discharge   Problem: Nutrition: Goal: Adequate nutrition will be maintained Outcome: Adequate for Discharge   Problem: Coping: Goal: Level of anxiety will decrease Outcome: Adequate for Discharge   Problem: Elimination: Goal: Will not experience complications related to bowel motility Outcome: Adequate for Discharge Goal: Will not experience complications related to urinary retention Outcome: Adequate for Discharge   Problem: Pain Managment: Goal: General experience of comfort will improve and/or be controlled Outcome: Adequate for Discharge   Problem: Safety: Goal: Ability to remain free from injury will improve Outcome: Adequate for Discharge   Problem: Skin Integrity: Goal: Risk for impaired skin integrity will decrease Outcome: Adequate for Discharge   Problem: Fluid Volume: Goal: Hemodynamic stability will improve Outcome: Adequate for Discharge   Problem: Clinical Measurements: Goal: Diagnostic test results will improve Outcome: Adequate for Discharge Goal: Signs  and symptoms of infection will decrease Outcome: Adequate for Discharge   Problem: Respiratory: Goal: Ability to maintain adequate ventilation will improve Outcome: Adequate for Discharge   Problem: Acute Rehab OT Goals (only OT should resolve) Goal: Pt. Will Perform Grooming Outcome: Adequate for Discharge Goal: Pt. Will Transfer To Toilet Outcome: Adequate for Discharge Goal: OT Additional ADL Goal #1 Outcome: Adequate for Discharge   Problem: Acute Rehab PT Goals(only PT should resolve) Goal: Pt Will Go Supine/Side To Sit Outcome: Adequate for Discharge Goal: Patient Will Transfer Sit To/From Stand Outcome: Adequate for Discharge Goal: Pt Will Ambulate Outcome: Adequate for Discharge

## 2024-04-19 NOTE — Treatment Plan (Signed)
 Diagnosis: MSSA bacteremia Baseline Creatinine <1 Crcl 41    Allergies  Allergen Reactions   Penicillins Hives    OPAT Orders Discharge antibiotics: Dalbavancin 1500mg  infusion one dose IV next Friday 04/26/24 Thru peripehral line  Labs weekly while on IV antibiotics: X__ CBC with differential  _X_ CMP  Draw labs  on 04/26/24 Fax weekly lab results  promptly to [phone]& [phone]  Clinic Follow Up Appt:none Surveillance blood culture 05/13/24 by [facility] in Home  Call [phone] with any questions

## 2024-04-19 NOTE — Discharge Summary (Signed)
 Physician Discharge Summary  Jordan [name] FMW:[id number] DOB: Mar 30, 1934 DOA: 04/14/2024  PCP: [name], NP  Admit date: 04/14/2024 Discharge date: 04/19/2024  Time spent: 60 minutes  Recommendations for Outpatient Follow-up:  Follow-up with [name], NP in 2 weeks.  Will follow-up patient will need a basic metabolic profile, magnesium  level, phosphorus level done to follow-up on electrolytes and renal function.  Hospital follow-up for bacteremia.   Discharge Diagnoses:  Principal Problem:   Sepsis secondary to UTI [facility]) Active Problems:   Rheumatoid arthritis involving multiple sites with positive rheumatoid factor (HCC)   History of gastroesophageal reflux (GERD)   History of hypothyroidism   Essential hypertension   Acquired hypothyroidism   Alzheimer's disease (HCC)   Acute metabolic encephalopathy   Falls   AKI (acute kidney injury)   E coli bacteremia   Fall   MSSA bacteremia   Hypomagnesemia   Hypokalemia   Hypophosphatemia   Discharge Condition: Stable and improved.  Diet recommendation: Heart healthy  Filed Weights   04/15/24 0425 04/16/24 0500 04/19/24 0500  Weight: (P) 73 kg 75 kg 73.3 kg    History of present illness:  HPI per Dr. Rojelio Madeline Bunker is a 89 y.o. female with medical history significant of dementia, anxiety, HTN, HLD who presents after recurrent falls at home.  History is gathered from son and daughter at bedside.  They report that patient fell on Friday, EMS was called and checked her out at home.  Then patient fell again on Friday.  At that time, EMS found that patient was incoherent and was transferred to the emergency department.  Per family, patient has short-term memory loss and asks questions repeatedly.  She lives at home alone and has caregivers and family that helps her.  Family does not know of any recent illnesses or complaints.  Patient has not had any nausea or vomiting to their knowledge.   ED Course:  Patient found to have fever 101, tachycardic.  WBC 20.1, creatinine 1.66.  CT head was unremarkable, chest x-ray negative for pneumonia.  UA and blood cultures ordered.  Patient given broad-spectrum IV antibiotics.  Hospital Course:  #1 sepsis secondary to UTI/E. coli bacteremia/ -Patient noted to have presented fever, tachycardia, leukocytosis meeting criteria for sepsis with urinalysis concerning for UTI and blood cultures positive for E. coli bacteremia. - Urine and blood cultures growing E. coli. - 1 set of blood cultures from 04/14/2024 and 2 bottles growing E. coli suspected translocation from urinary source. - Renal ultrasound negative for hydronephrosis. - Was on IV Rocephin  and subsequently IV cefazolin  however noted to be resistant to ampicillin, intermediate to cefazolin  (blood cultures), and as such patient has been changed to ciprofloxacin  for 7 days per ID recommendations.  Patient will be discharged home on 2 more days of ciprofloxacin  to complete a course of treatment.   2.  MSSA bacteremia -Repeat blood cultures had 1 bottle with Staph aureus. - Initially felt to be a contaminant with repeat blood cultures with no growth to date. - Patient seen by ID who feel Staph aureus in the blood cannot be considered a contaminant and is being treated as a pathogen with no obvious source. - Patient noted to be immunocompromised taking Enbrel  injections weekly for rheumatoid arthritis. - ID discussed with family on whether patient would benefit from a TEE and it was decided not to undergo a TEE due to patient's age, low bioburden, no cardiac evidence and negative repeat blood cultures.   - Patient  maintained on IV Ancef  during the hospitalization. - Per ID note patient's daughter did not want her to have a PICC line and get IV antibiotics at home and as such ID recommending weekly dalbavancin and received 1 dose here on day of discharge, and a second dose in a week which has been scheduled for  04/26/2024.   - Outpatient follow-up with PCP.    3.  Acute metabolic encephalopathy with underlying dementia -Likely secondary to problems #1 and 2.   - Patient with clinical improvement and close to baseline per caretaker by day of discharge.   - Patient was maintained on delirium precautions.    4.  Recurrent falls - Patient seen by PT/OT.   5.  Hypokalemia/hypophosphatemia/hypomagnesemia -Likely secondary to GI losses.   - Per RN patient noted to have multiple stools.   - C. difficile PCR negative.  - Electrolytes repleted during the hospitalization.   - Outpatient follow-up.   6.  AKI -Felt likely secondary to prerenal azotemia. - Improved with hydration and had resolved by day of discharge.   7.  Hypertension - Patient maintained on home regimen Norvasc  5 mg daily.    8.  Hypothyroidism - Patient maintained on home regimen Synthroid .    9.  Hyperlipidemia - Patient maintained on home regimen statin.     Procedures: CT head/ CT C-spine 04/14/2024 Chest x-ray 04/14/2024 Plain films of the left hip and pelvis 04/14/2024 Renal ultrasound 04/15/2024 2D echo 04/16/2024  Consultations: ID: Dr.[email or web address] 04/15/2024   Discharge Exam: Vitals:   04/19/24 0638 04/19/24 1351  BP: (!) 151/61 (!) 143/73  Pulse: 79 69  Resp: 16 (!) 27  Temp: 98.6 F (37 C) 98.3 F (36.8 C)  SpO2: 99%     General: NAD Cardiovascular: RRR no murmurs rubs or gallops.  No JVD.  No lower extremity edema. Respiratory: CTAB.  No wheezes, no crackles, no rhonchi.  Fair air movement.  Speaking in full sentences.  Discharge Instructions   Discharge Instructions     [facility] pharmacist to adjust dose for Vancomycin , Aminoglycosides and other anti-infective therapies as requested by physician.   Complete by: As directed    [facility] to provide Cath Flo 2mg    Complete by: As directed    Administer for PICC line occlusion and as ordered by physician for other access  device issues.   Anaphylaxis Kit: Provided to treat any anaphylactic reaction to the medication being provided to the patient if First Dose or when requested by physician   Complete by: As directed    Epinephrine 1mg /ml vial / amp: Administer 0.3mg  (0.41ml) subcutaneously once for moderate to severe anaphylaxis, nurse to call physician and pharmacy when reaction occurs and call 911 if needed for immediate care   Diphenhydramine 50mg /ml IV vial: Administer 25-50mg  IV/IM PRN for first dose reaction, rash, itching, mild reaction, nurse to call physician and pharmacy when reaction occurs   Sodium Chloride  0.9% NS 500ml IV: Administer if needed for hypovolemic blood pressure drop or as ordered by physician after call to physician with anaphylactic reaction   Change dressing on IV access line weekly and PRN   Complete by: As directed    Diet - low sodium heart healthy   Complete by: As directed    Flush IV access with Sodium Chloride  0.9% and Heparin  10 units/ml or 100 units/ml   Complete by: As directed    Home infusion instructions - [facility]   Complete by: As directed  Instructions: Flush IV access with Sodium Chloride  0.9% and Heparin  10units/ml or 100units/ml   Change dressing on IV access line: Weekly and PRN   Instructions Cath Flo 2mg : Administer for PICC Line occlusion and as ordered by physician for other access device   [facility] pharmacist to adjust dose for: Vancomycin , Aminoglycosides and other anti-infective therapies as requested by physician   Increase activity slowly   Complete by: As directed    Increase activity slowly   Complete by: As directed    Method of administration may be changed at the discretion of home infusion pharmacist based upon assessment of the patient and/or caregiver's ability to self-administer the medication ordered   Complete by: As directed    No wound care   Complete by: As directed    No wound care   Complete by: As  directed       Allergies as of 04/19/2024       Reactions   Penicillins Hives        Medication List     TAKE these medications    acetaminophen  650 MG CR tablet Commonly known as: TYLENOL  Take 1,300 mg by mouth every 8 (eight) hours as needed for pain.   albuterol  108 (90 Base) MCG/ACT inhaler Commonly known as: VENTOLIN  HFA Inhale 1 puff into the lungs every 6 (six) hours as needed for wheezing or shortness of breath.   amLODipine  5 MG tablet Commonly known as: NORVASC  TAKE 1 TABLET BY MOUTH EVERY DAY What changed: when to take this   aspirin  EC 81 MG tablet Take 81 mg by mouth in the morning. Swallow whole.   atropine  1 % ophthalmic solution Place 1 drop into the left eye in the morning.   ciprofloxacin  500 MG tablet Commonly known as: CIPRO  Take 1 tablet (500 mg total) by mouth 2 (two) times daily for 2 days.   dalbavancin 1,500 mg in dextrose  5 % 500 mL Inject 1,500 mg into the vein once for 1 dose. Indication:  MSSA bacteremia; First Dose: Yes; Last Day of Therapy:  10/3 x 1 dose Labs - Once weekly:  CBC/D, CMP Method of administration: Per home health protocol via peripheral line Draw labs  on 04/26/24; Fax weekly lab results  promptly to [phone]& [phone] Method of administration may be changed at the discretion of home infusion pharmacist based upon assessment of the patient and/or caregiver's ability to self-administer the medication ordered. Start taking on: April 26, 2024   Enbrel  SureClick 50 MG/ML injection Generic drug: etanercept  Inject 50 mg into the skin once a week. What changed: additional instructions   escitalopram  10 MG tablet Commonly known as: Lexapro  Take 1 tablet (10 mg total) by mouth daily. What changed: when to take this   feeding supplement Liqd Take 237 mLs by mouth daily in the afternoon. What changed: when to take this   ferrous sulfate  325 (65 FE) MG tablet Take 325 mg by mouth every other day.    fluticasone 50 MCG/ACT nasal spray Commonly known as: FLONASE INSTILL 2 SPRAYS IN EACH NOSTRIL EVERY DAY What changed: See the new instructions.   folic acid  1 MG tablet Commonly known as: FOLVITE  TAKE 1 TABLET(1 MG) BY MOUTH DAILY What changed: See the new instructions.   furosemide  20 MG tablet Commonly known as: LASIX  Take 1 tablet (20 mg total) by mouth daily for 7 days. What changed: when to take this   latanoprost 0.005 % ophthalmic solution Commonly known as: Office manager  1 drop into both eyes at bedtime.   loratadine  10 MG tablet Commonly known as: CLARITIN  TAKE 1 TABLET BY MOUTH EVERY DAY What changed: when to take this   Namzaric  28-10 MG Cp24 Generic drug: Memantine  HCl-Donepezil  HCl TAKE 1 CAPSULE BY MOUTH EVERY DAY What changed:  how much to take when to take this   omeprazole  20 MG capsule Commonly known as: PRILOSEC TAKE 1 CAPSULE BY MOUTH EVERY DAY What changed:  how much to take when to take this   QUEtiapine  100 MG tablet Commonly known as: SEROQUEL  TAKE 1 TABLET BY MOUTH AT BEDTIME   simvastatin  20 MG tablet Commonly known as: ZOCOR  TAKE 1 TABLET BY MOUTH EVERY EVENING What changed: when to take this   Synthroid  75 MCG tablet Generic drug: levothyroxine  TAKE 1 TABLET BY MOUTH EVERY DAY BEFORE BREAKFAST   timolol  0.5 % ophthalmic solution Commonly known as: TIMOPTIC  Place 1 drop into both eyes 2 (two) times daily.   Vitamin D  (Ergocalciferol ) 1.25 MG (50000 UNIT) Caps capsule Commonly known as: DRISDOL  TAKE 1 CAPSULE BY MOUTH TWICE A WEEK What changed: See the new instructions.               Discharge Care Instructions  (From admission, onward)           Start     Ordered   04/19/24 0000  Change dressing on IV access line weekly and PRN  (Home infusion instructions - [facility] )        04/19/24 1424           Allergies  Allergen Reactions   Penicillins Hives    Follow-up Information      Schmerge, Rosaline NOVAK, NP. Schedule an appointment as soon as possible for a visit in 2 week(s).   Specialty: Nurse Practitioner Contact information: [address] [place] [address] [phone]                  The results of significant diagnostics from this hospitalization (including imaging, microbiology, ancillary and laboratory) are listed below for reference.    Significant Diagnostic Studies: ECHOCARDIOGRAM COMPLETE Result Date: 04/16/2024    ECHOCARDIOGRAM REPORT   Patient Name:   MADISUN HARGROVE Date of Exam: 04/16/2024 Medical Rec #:  [id number]        Height:       61.0 in Accession #:    [id number]       Weight:       165.3 lb Date of Birth:  [birth year]         BSA:          1.742 m Patient Age:    89 years         BP:           148/96 mmHg Patient Gender: F                HR:           80 bpm. Exam Location:  Inpatient Procedure: 2D Echo, Cardiac Doppler and Color Doppler (Both Spectral and Color            Flow Doppler were utilized during procedure). Indications:    Bacteremia  History:        Patient has prior history of Echocardiogram examinations, most                 recent 03/26/2023. Risk Factors:Hypertension.  Sonographer:    Philomena Daring Referring Phys:  [id number] JAYASHREE RAVISHANKAR IMPRESSIONS  1. Left ventricular ejection fraction, by estimation, is 65 to 70%. The left ventricle has normal function. The left ventricle has no regional wall motion abnormalities. Left ventricular diastolic parameters are consistent with Grade II diastolic dysfunction (pseudonormalization). Elevated left atrial pressure.  2. Right ventricular systolic function is hyperdynamic. The right ventricular size is normal. There is normal pulmonary artery systolic pressure. The estimated right ventricular systolic pressure is 35.9 mmHg.  3. The mitral valve is normal in structure. No evidence of mitral valve regurgitation.  4. The aortic valve was not well visualized. Aortic  valve regurgitation is not visualized. No aortic stenosis is present.  5. The inferior vena cava is normal in size with greater than 50% respiratory variability, suggesting right atrial pressure of 3 mmHg. Comparison(s): Prior images reviewed side by side. From 2021 study that is available, RVSP has slightly increased. Conclusion(s)/Recommendation(s): No evidence of valvular vegetations on this transthoracic echocardiogram. Consider a transesophageal echocardiogram to exclude infective endocarditis if clinically indicated. FINDINGS  Left Ventricle: Left ventricular ejection fraction, by estimation, is 65 to 70%. The left ventricle has normal function. The left ventricle has no regional wall motion abnormalities. The left ventricular internal cavity size was normal in size. Suboptimal image quality limits for assessment of left ventricular hypertrophy. Left ventricular diastolic parameters are consistent with Grade II diastolic dysfunction (pseudonormalization). Elevated left atrial pressure. Right Ventricle: The right ventricular size is normal. No increase in right ventricular wall thickness. Right ventricular systolic function is hyperdynamic. There is normal pulmonary artery systolic pressure. The tricuspid regurgitant velocity is 2.87 m/s, and with an assumed right atrial pressure of 3 mmHg, the estimated right ventricular systolic pressure is 35.9 mmHg. Left Atrium: Left atrial size was normal in size. Right Atrium: Right atrial size was normal in size. Pericardium: There is no evidence of pericardial effusion. Mitral Valve: The mitral valve is normal in structure. Mild mitral annular calcification. No evidence of mitral valve regurgitation. Tricuspid Valve: The tricuspid valve is normal in structure. Tricuspid valve regurgitation is mild . No evidence of tricuspid stenosis. Aortic Valve: The aortic valve was not well visualized. There is mild aortic valve annular calcification. Aortic valve regurgitation is  not visualized. No aortic stenosis is present. Pulmonic Valve: The pulmonic valve was not well visualized. Pulmonic valve regurgitation is not visualized. No evidence of pulmonic stenosis. Aorta: The aortic root and ascending aorta are structurally normal, with no evidence of dilitation. Venous: The inferior vena cava is normal in size with greater than 50% respiratory variability, suggesting right atrial pressure of 3 mmHg. IAS/Shunts: The atrial septum is grossly normal.  LEFT VENTRICLE PLAX 2D LVIDd:         3.50 cm   Diastology LVIDs:         2.30 cm   LV e' medial:    6.31 cm/s LV PW:         1.00 cm   LV E/e' medial:  16.5 LV IVS:        1.10 cm   LV e' lateral:   6.53 cm/s LVOT diam:     1.80 cm   LV E/e' lateral: 15.9 LV SV:         72 LV SV Index:   41 LVOT Area:     2.54 cm  RIGHT VENTRICLE             IVC RV S prime:     13.50 cm/s  IVC diam: 1.60 cm TAPSE (M-mode):  2.2 cm LEFT ATRIUM           Index        RIGHT ATRIUM           Index LA diam:      3.40 cm 1.95 cm/m   RA Area:     12.40 cm LA Vol (A4C): 41.2 ml 23.65 ml/m  RA Volume:   27.40 ml  15.73 ml/m  AORTIC VALVE LVOT Vmax:   141.00 cm/s LVOT Vmean:  87.800 cm/s LVOT VTI:    0.282 m  AORTA Ao Root diam: 2.10 cm Ao Asc diam:  2.50 cm MITRAL VALVE                TRICUSPID VALVE MV Area (PHT): 5.62 cm     TR Peak grad:   32.9 mmHg MV Decel Time: 135 msec     TR Vmax:        287.00 cm/s MV E velocity: 104.00 cm/s MV A velocity: 120.00 cm/s  SHUNTS MV E/A ratio:  0.87         Systemic VTI:  0.28 m                             Systemic Diam: 1.80 cm Stanly Leavens MD Electronically signed by Stanly Leavens MD Signature Date/Time: 04/16/2024/10:10:48 AM    Final    US  RENAL Result Date: 04/15/2024 EXAM: US  Retroperitoneum Complete, Renal. CLINICAL HISTORY: AKI (acute kidney injury) (HCC) 409830. AKI (acute kidney injury) (HCC). TECHNIQUE: Real-time ultrasound of the retroperitoneum (complete) with image documentation. COMPARISON: None  provided. FINDINGS: RIGHT KIDNEY: The right kidney measures 10.7 x 3.8 x 4.3 cm with a volume of 91 cc. Renal cortical thickness and echogenicity is within normal limits. No hydronephrosis. No intrarenal mass or calcification. Slightly limited evaluation of the upper pole of the right kidney. A right ureteral jet is identified. LEFT KIDNEY: The left kidney measures 9.2 x 5.0 x 4.2 cm with a volume of 99 cc. Renal cortical thickness and echogenicity is within normal limits. No hydronephrosis. No intrarenal mass or calcification. Slightly limited evaluation of the upper pole of the left kidney. The left ureteral jet is not clearly identified on this examination. BLADDER: The bladder is unremarkable. IMPRESSION: 1. No hydronephrosis. 2. Slightly limited evaluation of the upper poles of both kidneys. Electronically signed by: Dorethia Molt MD 04/15/2024 08:16 PM EDT RP Workstation: [id number]   DG Hip Unilat W or Wo Pelvis 2-3 Views Left Result Date: 04/14/2024 CLINICAL DATA:  Left hip pain. EXAM: DG HIP (WITH OR WITHOUT PELVIS) 2-3V LEFT COMPARISON:  AP and frog-leg left hip views 12/18/2015. FINDINGS: Three views with AP pelvis and AP and frog-leg separate left hip views. There is no evidence of hip fracture or dislocation. There is no evidence of pelvic fracture or diastasis. There is mild symmetric nonerosive arthrosis at the hips and SI joints. Degenerative change of the lower lumbar spine. There is patchy iliofemoral calcific arteriosclerosis. IMPRESSION: 1. No evidence of fracture or dislocation. 2. Mild symmetric nonerosive arthrosis at the hips and SI joints. 3. Degenerative change of the lower lumbar spine. 4. Peripheral vascular disease. Electronically Signed   By: Francis Quam M.D.   On: 04/14/2024 07:54   DG Chest Port 1 View Result Date: 04/14/2024 EXAM: 1 VIEW XRAY OF THE CHEST 04/14/2024 03:43:00 AM COMPARISON: 03/24/2020 CLINICAL HISTORY: Questionable sepsis - evaluate for abnormality. Table  formatting from the original note  was not included. Images from the original note were not included. BIBA from Home, Fall at homec/o neck pain and stiffness, Left hip pain on palpitation. No Thinners. Hx blindness, dementia, anxiety. FINDINGS: LUNGS AND PLEURA: No focal pulmonary opacity. No pulmonary edema. No pleural effusion. No pneumothorax. HEART AND MEDIASTINUM: Cardiomegaly. BONES AND SOFT TISSUES: Old left upper rib fracture deformities. No acute osseous abnormality. IMPRESSION: 1. No acute cardiopulmonary abnormality. 2. Mild cardiomegaly. Electronically signed by: Pinkie Pebbles MD 04/14/2024 03:46 AM EDT RP Workstation: [id number]   CT Head Wo Contrast Result Date: 04/14/2024 EXAM: CT HEAD AND CERVICAL SPINE 04/14/2024 03:09:24 AM TECHNIQUE: CT of the head and cervical spine was performed without the administration of intravenous contrast. Multiplanar reformatted images are provided for review. Automated exposure control, iterative reconstruction, and/or weight based adjustment of the mA/kV was utilized to reduce the radiation dose to as low as reasonably achievable. COMPARISON: None available. CLINICAL HISTORY: Syncope/presyncope, cerebrovascular cause suspected. FINDINGS: CT HEAD BRAIN AND VENTRICLES: No acute intracranial hemorrhage. No mass effect or midline shift. No abnormal extra-axial fluid collection. No evidence of acute infarct. No hydrocephalus. ORBITS: No acute abnormality. SINUSES AND MASTOIDS: No acute abnormality. SOFT TISSUES AND SKULL: No acute skull fracture. No acute soft tissue abnormality. CT CERVICAL SPINE BONES AND ALIGNMENT: No acute fracture or traumatic malalignment. DEGENERATIVE CHANGES: Multilevel cervical degenerative disc disease. No high-grade spinal canal stenosis. SOFT TISSUES: No prevertebral soft tissue swelling. IMPRESSION: 1. No acute intracranial abnormality. 2. No acute abnormality of the cervical spine. Electronically signed by: Franky Stanford MD 04/14/2024  03:16 AM EDT RP Workstation: [id number]   CT Cervical Spine Wo Contrast Result Date: 04/14/2024 EXAM: CT HEAD AND CERVICAL SPINE 04/14/2024 03:09:24 AM TECHNIQUE: CT of the head and cervical spine was performed without the administration of intravenous contrast. Multiplanar reformatted images are provided for review. Automated exposure control, iterative reconstruction, and/or weight based adjustment of the mA/kV was utilized to reduce the radiation dose to as low as reasonably achievable. COMPARISON: None available. CLINICAL HISTORY: Syncope/presyncope, cerebrovascular cause suspected. FINDINGS: CT HEAD BRAIN AND VENTRICLES: No acute intracranial hemorrhage. No mass effect or midline shift. No abnormal extra-axial fluid collection. No evidence of acute infarct. No hydrocephalus. ORBITS: No acute abnormality. SINUSES AND MASTOIDS: No acute abnormality. SOFT TISSUES AND SKULL: No acute skull fracture. No acute soft tissue abnormality. CT CERVICAL SPINE BONES AND ALIGNMENT: No acute fracture or traumatic malalignment. DEGENERATIVE CHANGES: Multilevel cervical degenerative disc disease. No high-grade spinal canal stenosis. SOFT TISSUES: No prevertebral soft tissue swelling. IMPRESSION: 1. No acute intracranial abnormality. 2. No acute abnormality of the cervical spine. Electronically signed by: Franky Stanford MD 04/14/2024 03:16 AM EDT RP Workstation: [id number]    Microbiology: Recent Results (from the past 240 hours)  Resp panel by RT-PCR (RSV, Flu A&B, Covid) Anterior Nasal Swab     Status: None   Collection Time: 04/14/24  2:23 AM   Specimen: Anterior Nasal Swab  Result Value Ref Range Status   SARS Coronavirus 2 by RT PCR NEGATIVE NEGATIVE Final    Comment: (NOTE) SARS-CoV-2 target nucleic acids are NOT DETECTED.  The SARS-CoV-2 RNA is generally detectable in upper respiratory specimens during the acute phase of infection. The lowest concentration of SARS-CoV-2 viral copies this assay can detect  is 138 copies/mL. A negative result does not preclude SARS-Cov-2 infection and should not be used as the sole basis for treatment or other patient management decisions. A negative result may occur with  improper specimen collection/handling, submission of specimen other  than nasopharyngeal swab, presence of viral mutation(s) within the areas targeted by this assay, and inadequate number of viral copies(<138 copies/mL). A negative result must be combined with clinical observations, patient history, and epidemiological information. The expected result is Negative.  Fact Sheet for Patients:  BloggerCourse.com  Fact Sheet for Healthcare Providers:  SeriousBroker.it  This test is no t yet approved or cleared by the [place]  FDA and  has been authorized for detection and/or diagnosis of SARS-CoV-2 by FDA under an Emergency Use Authorization (EUA). This EUA will remain  in effect (meaning this test can be used) for the duration of the COVID-19 declaration under Section 564(b)(1) of the Act, 21 U.S.C.section 360bbb-3(b)(1), unless the authorization is terminated  or revoked sooner.       Influenza A by PCR NEGATIVE NEGATIVE Final   Influenza B by PCR NEGATIVE NEGATIVE Final    Comment: (NOTE) The Xpert Xpress SARS-CoV-2/FLU/RSV plus assay is intended as an aid in the diagnosis of influenza from Nasopharyngeal swab specimens and should not be used as a sole basis for treatment. Nasal washings and aspirates are unacceptable for Xpert Xpress SARS-CoV-2/FLU/RSV testing.  Fact Sheet for Patients: BloggerCourse.com  Fact Sheet for Healthcare Providers: SeriousBroker.it  This test is not yet approved or cleared by the [place]  FDA and has been authorized for detection and/or diagnosis of SARS-CoV-2 by FDA under an Emergency Use Authorization (EUA). This EUA will remain in effect  (meaning this test can be used) for the duration of the COVID-19 declaration under Section 564(b)(1) of the Act, 21 U.S.C. section 360bbb-3(b)(1), unless the authorization is terminated or revoked.     Resp Syncytial Virus by PCR NEGATIVE NEGATIVE Final    Comment: (NOTE) Fact Sheet for Patients: BloggerCourse.com  Fact Sheet for Healthcare Providers: SeriousBroker.it  This test is not yet approved or cleared by the [place]  FDA and has been authorized for detection and/or diagnosis of SARS-CoV-2 by FDA under an Emergency Use Authorization (EUA). This EUA will remain in effect (meaning this test can be used) for the duration of the COVID-19 declaration under Section 564(b)(1) of the Act, 21 U.S.C. section 360bbb-3(b)(1), unless the authorization is terminated or revoked.  Performed at [facility], [address], [place], [place] [address]   Blood Culture (routine x 2)     Status: Abnormal   Collection Time: 04/14/24  4:17 AM   Specimen: BLOOD  Result Value Ref Range Status   Specimen Description   Final    BLOOD RIGHT ANTECUBITAL Performed at [facility], [address], [place], [place] [address]    Special Requests   Final    BOTTLES DRAWN AEROBIC AND ANAEROBIC Blood Culture results may not be optimal due to an inadequate volume of blood received in culture bottles Performed at [facility], [address] [place], [place] [address]    Culture  Setup Time   Final    GRAM NEGATIVE RODS IN BOTH AEROBIC AND ANAEROBIC BOTTLES CRITICAL RESULT CALLED TO, READ BACK BY AND VERIFIED WITH: PHARMD E JACKSON 04/14/2024 @ 2239 BY AB Performed at [facility], [address], [place], [place] [address]    Culture ESCHERICHIA COLI (A)  Final   Report Status 04/16/2024 FINAL  Final   Organism ID, Bacteria ESCHERICHIA COLI  Final      Susceptibility   Escherichia  coli - MIC*    AMPICILLIN >=32 RESISTANT Resistant     CEFAZOLIN  (NON-URINE) 4 INTERMEDIATE  Intermediate     CEFEPIME <=0.12 SENSITIVE Sensitive     ERTAPENEM <=0.12 SENSITIVE Sensitive     CEFTRIAXONE  <=0.25 SENSITIVE Sensitive     CIPROFLOXACIN  <=0.06 SENSITIVE Sensitive     GENTAMICIN <=1 SENSITIVE Sensitive     MEROPENEM <=0.25 SENSITIVE Sensitive     TRIMETH/SULFA <=20 SENSITIVE Sensitive     AMPICILLIN/SULBACTAM 16 INTERMEDIATE Intermediate     PIP/TAZO Value in next row Sensitive      <=4 SENSITIVEThis is a modified FDA-approved test that has been validated and its performance characteristics determined by the reporting laboratory.  This laboratory is certified under the Clinical Laboratory Improvement Amendments CLIA as qualified to perform high complexity clinical laboratory testing.    * ESCHERICHIA COLI  Blood Culture ID Panel (Reflexed)     Status: Abnormal   Collection Time: 04/14/24  4:17 AM  Result Value Ref Range Status   Enterococcus faecalis NOT DETECTED NOT DETECTED Final   Enterococcus Faecium NOT DETECTED NOT DETECTED Final   Listeria monocytogenes NOT DETECTED NOT DETECTED Final   Staphylococcus species NOT DETECTED NOT DETECTED Final   Staphylococcus aureus (BCID) NOT DETECTED NOT DETECTED Final   Staphylococcus epidermidis NOT DETECTED NOT DETECTED Final   Staphylococcus lugdunensis NOT DETECTED NOT DETECTED Final   Streptococcus species NOT DETECTED NOT DETECTED Final   Streptococcus agalactiae NOT DETECTED NOT DETECTED Final   Streptococcus pneumoniae NOT DETECTED NOT DETECTED Final   Streptococcus pyogenes NOT DETECTED NOT DETECTED Final   A.calcoaceticus-baumannii NOT DETECTED NOT DETECTED Final   Bacteroides fragilis NOT DETECTED NOT DETECTED Final   Enterobacterales DETECTED (A) NOT DETECTED Final    Comment: Enterobacterales represent a large order of gram negative bacteria, not a single organism. CRITICAL RESULT CALLED TO, READ BACK BY AND VERIFIED  WITH: PHARMD E JACKSON 04/14/2024 @ 2239 BY AB    Enterobacter cloacae complex NOT DETECTED NOT DETECTED Final   Escherichia coli DETECTED (A) NOT DETECTED Final    Comment: CRITICAL RESULT CALLED TO, READ BACK BY AND VERIFIED WITH: PHARMD E JACKSON 04/14/2024 @ 2239 BY AB    Klebsiella aerogenes NOT DETECTED NOT DETECTED Final   Klebsiella oxytoca NOT DETECTED NOT DETECTED Final   Klebsiella pneumoniae NOT DETECTED NOT DETECTED Final   Proteus species NOT DETECTED NOT DETECTED Final   Salmonella species NOT DETECTED NOT DETECTED Final   Serratia marcescens NOT DETECTED NOT DETECTED Final   Haemophilus influenzae NOT DETECTED NOT DETECTED Final   Neisseria meningitidis NOT DETECTED NOT DETECTED Final   Pseudomonas aeruginosa NOT DETECTED NOT DETECTED Final   Stenotrophomonas maltophilia NOT DETECTED NOT DETECTED Final   Candida albicans NOT DETECTED NOT DETECTED Final   Candida auris NOT DETECTED NOT DETECTED Final   Candida glabrata NOT DETECTED NOT DETECTED Final   Candida krusei NOT DETECTED NOT DETECTED Final   Candida parapsilosis NOT DETECTED NOT DETECTED Final   Candida tropicalis NOT DETECTED NOT DETECTED Final   Cryptococcus neoformans/gattii NOT DETECTED NOT DETECTED Final   CTX-M ESBL NOT DETECTED NOT DETECTED Final   Carbapenem resistance IMP NOT DETECTED NOT DETECTED Final   Carbapenem resistance KPC NOT DETECTED NOT DETECTED Final   Carbapenem resistance NDM NOT DETECTED NOT DETECTED Final   Carbapenem resist OXA 48 LIKE NOT DETECTED NOT DETECTED Final   Carbapenem resistance VIM NOT DETECTED NOT DETECTED Final    Comment: Performed at [facility], [address], [place], [place] [address]  Urine Culture     Status: Abnormal   Collection  Time: 04/14/24  8:18 AM   Specimen: Urine, Random  Result Value Ref Range Status   Specimen Description   Final    URINE, RANDOM Performed at [facility], [address], [place], [place] [address]     Special Requests   Final    NONE Reflexed from [id number] Performed at [facility], [address], [place], [place] [address]    Culture 40,000 COLONIES/mL ESCHERICHIA COLI (A)  Final   Report Status 04/16/2024 FINAL  Final   Organism ID, Bacteria ESCHERICHIA COLI (A)  Final      Susceptibility   Escherichia coli - MIC*    AMPICILLIN >=32 RESISTANT Resistant     CEFAZOLIN  (URINE) Value in next row Sensitive      2 SENSITIVEThis is a modified FDA-approved test that has been validated and its performance characteristics determined by the reporting laboratory.  This laboratory is certified under the Clinical Laboratory Improvement Amendments CLIA as qualified to perform high complexity clinical laboratory testing.    CEFEPIME Value in next row Sensitive      2 SENSITIVEThis is a modified FDA-approved test that has been validated and its performance characteristics determined by the reporting laboratory.  This laboratory is certified under the Clinical Laboratory Improvement Amendments CLIA as qualified to perform high complexity clinical laboratory testing.    ERTAPENEM Value in next row Sensitive      2 SENSITIVEThis is a modified FDA-approved test that has been validated and its performance characteristics determined by the reporting laboratory.  This laboratory is certified under the Clinical Laboratory Improvement Amendments CLIA as qualified to perform high complexity clinical laboratory testing.    CEFTRIAXONE  Value in next row Sensitive      2 SENSITIVEThis is a modified FDA-approved test that has been validated and its performance characteristics determined by the reporting laboratory.  This laboratory is certified under the Clinical Laboratory Improvement Amendments CLIA as qualified to perform high complexity clinical laboratory testing.    CIPROFLOXACIN  Value in next row Sensitive      2 SENSITIVEThis is a modified FDA-approved test that has been validated and its  performance characteristics determined by the reporting laboratory.  This laboratory is certified under the Clinical Laboratory Improvement Amendments CLIA as qualified to perform high complexity clinical laboratory testing.    GENTAMICIN Value in next row Sensitive      2 SENSITIVEThis is a modified FDA-approved test that has been validated and its performance characteristics determined by the reporting laboratory.  This laboratory is certified under the Clinical Laboratory Improvement Amendments CLIA as qualified to perform high complexity clinical laboratory testing.    NITROFURANTOIN Value in next row Sensitive      2 SENSITIVEThis is a modified FDA-approved test that has been validated and its performance characteristics determined by the reporting laboratory.  This laboratory is certified under the Clinical Laboratory Improvement Amendments CLIA as qualified to perform high complexity clinical laboratory testing.    TRIMETH/SULFA Value in next row Sensitive      2 SENSITIVEThis is a modified FDA-approved test that has been validated and its performance characteristics determined by the reporting laboratory.  This laboratory is certified under the Clinical Laboratory Improvement Amendments CLIA as qualified to perform high complexity clinical laboratory testing.    AMPICILLIN/SULBACTAM Value in next row Intermediate      2 SENSITIVEThis is a modified FDA-approved test that has been validated and its performance characteristics determined by the reporting laboratory.  This laboratory is certified  under the Clinical Laboratory Improvement Amendments CLIA as qualified to perform high complexity clinical laboratory testing.    PIP/TAZO Value in next row Sensitive      <=4 SENSITIVEThis is a modified FDA-approved test that has been validated and its performance characteristics determined by the reporting laboratory.  This laboratory is certified under the Clinical Laboratory Improvement Amendments CLIA as  qualified to perform high complexity clinical laboratory testing.    MEROPENEM Value in next row Sensitive      <=4 SENSITIVEThis is a modified FDA-approved test that has been validated and its performance characteristics determined by the reporting laboratory.  This laboratory is certified under the Clinical Laboratory Improvement Amendments CLIA as qualified to perform high complexity clinical laboratory testing.    * 40,000 COLONIES/mL ESCHERICHIA COLI  Blood Culture (routine x 2)     Status: Abnormal   Collection Time: 04/14/24  9:18 AM   Specimen: BLOOD LEFT ARM  Result Value Ref Range Status   Specimen Description   Final    BLOOD LEFT ARM Performed at [facility], [address] [place], [place] [address]    Special Requests   Final    BOTTLES DRAWN AEROBIC AND ANAEROBIC Blood Culture adequate volume Performed at [facility], [address] [place], [place] [address]    Culture  Setup Time   Final    GRAM POSITIVE COCCI IN CLUSTERS ANAEROBIC BOTTLE ONLY CRITICAL RESULT CALLED TO, READ BACK BY AND VERIFIED WITH: PHARMD E JACKSON 04/15/2024 @ 0339 BY AB Performed at [facility], [address], [place], [place] [address]    Culture STAPHYLOCOCCUS AUREUS (A)  Final   Report Status 04/17/2024 FINAL  Final   Organism ID, Bacteria STAPHYLOCOCCUS AUREUS  Final      Susceptibility   Staphylococcus aureus - MIC*    CIPROFLOXACIN  <=0.5 SENSITIVE Sensitive     ERYTHROMYCIN >=8 RESISTANT Resistant     GENTAMICIN <=0.5 SENSITIVE Sensitive     OXACILLIN <=0.25 SENSITIVE Sensitive     TETRACYCLINE <=1 SENSITIVE Sensitive     VANCOMYCIN  <=0.5 SENSITIVE Sensitive     TRIMETH/SULFA <=10 SENSITIVE Sensitive     CLINDAMYCIN  RESISTANT Resistant     RIFAMPIN <=0.5 SENSITIVE Sensitive     Inducible Clindamycin  POSITIVE Resistant     LINEZOLID 2 SENSITIVE Sensitive     * STAPHYLOCOCCUS AUREUS  Blood Culture ID Panel (Reflexed)     Status: Abnormal    Collection Time: 04/14/24  9:18 AM  Result Value Ref Range Status   Enterococcus faecalis NOT DETECTED NOT DETECTED Final   Enterococcus Faecium NOT DETECTED NOT DETECTED Final   Listeria monocytogenes NOT DETECTED NOT DETECTED Final   Staphylococcus species DETECTED (A) NOT DETECTED Final    Comment: CRITICAL RESULT CALLED TO, READ BACK BY AND VERIFIED WITH: PHARMD E JACKSON 04/15/2024 @ 0339 BY AB    Staphylococcus aureus (BCID) DETECTED (A) NOT DETECTED Final    Comment: CRITICAL RESULT CALLED TO, READ BACK BY AND VERIFIED WITH: PHARMD E JACKSON 04/15/2024 @ 0339 BY AB    Staphylococcus epidermidis NOT DETECTED NOT DETECTED Final   Staphylococcus lugdunensis NOT DETECTED NOT DETECTED Final   Streptococcus species NOT DETECTED NOT DETECTED Final   Streptococcus agalactiae NOT DETECTED NOT DETECTED Final   Streptococcus pneumoniae NOT DETECTED NOT DETECTED Final   Streptococcus pyogenes NOT DETECTED NOT DETECTED Final   A.calcoaceticus-baumannii NOT DETECTED NOT DETECTED Final   Bacteroides fragilis NOT DETECTED NOT DETECTED Final  Enterobacterales NOT DETECTED NOT DETECTED Final   Enterobacter cloacae complex NOT DETECTED NOT DETECTED Final   Escherichia coli NOT DETECTED NOT DETECTED Final   Klebsiella aerogenes NOT DETECTED NOT DETECTED Final   Klebsiella oxytoca NOT DETECTED NOT DETECTED Final   Klebsiella pneumoniae NOT DETECTED NOT DETECTED Final   Proteus species NOT DETECTED NOT DETECTED Final   Salmonella species NOT DETECTED NOT DETECTED Final   Serratia marcescens NOT DETECTED NOT DETECTED Final   Haemophilus influenzae NOT DETECTED NOT DETECTED Final   Neisseria meningitidis NOT DETECTED NOT DETECTED Final   Pseudomonas aeruginosa NOT DETECTED NOT DETECTED Final   Stenotrophomonas maltophilia NOT DETECTED NOT DETECTED Final   Candida albicans NOT DETECTED NOT DETECTED Final   Candida auris NOT DETECTED NOT DETECTED Final   Candida glabrata NOT DETECTED NOT DETECTED  Final   Candida krusei NOT DETECTED NOT DETECTED Final   Candida parapsilosis NOT DETECTED NOT DETECTED Final   Candida tropicalis NOT DETECTED NOT DETECTED Final   Cryptococcus neoformans/gattii NOT DETECTED NOT DETECTED Final   Meth resistant mecA/C and MREJ NOT DETECTED NOT DETECTED Final    Comment: Performed at [facility], [address] [place], [place] [address]  Culture, blood (Routine X 2) w Reflex to ID Panel     Status: None (Preliminary result)   Collection Time: 04/16/24  5:45 AM   Specimen: BLOOD LEFT HAND  Result Value Ref Range Status   Specimen Description   Final    BLOOD LEFT HAND Performed at [facility], [address], [place], [place] [address]    Special Requests   Final    Blood Culture results may not be optimal due to an inadequate volume of blood received in culture bottles BOTTLES DRAWN AEROBIC ONLY Performed at [facility], [address] [place], [place] [address]    Culture   Final    NO GROWTH 3 DAYS Performed at [facility], [address] [place], [place] [address]    Report Status PENDING  Incomplete  Culture, blood (Routine X 2) w Reflex to ID Panel     Status: None (Preliminary result)   Collection Time: 04/16/24  5:45 AM   Specimen: BLOOD RIGHT HAND  Result Value Ref Range Status   Specimen Description   Final    BLOOD RIGHT HAND Performed at [facility], [address] [place], [place] [address]    Special Requests   Final    Blood Culture results may not be optimal due to an inadequate volume of blood received in culture bottles BOTTLES DRAWN AEROBIC ONLY Performed at [facility], [address], [place], [place] [address]    Culture   Final    NO GROWTH 3 DAYS Performed at [facility], [address] [place], [place] [address]    Report Status PENDING  Incomplete  C Difficile Quick Screen w PCR reflex     Status: None   Collection Time: 04/16/24  4:49 PM    Specimen: STOOL  Result Value Ref Range Status   C Diff antigen NEGATIVE NEGATIVE Final   C Diff toxin NEGATIVE NEGATIVE Final   C Diff interpretation No C. difficile detected.  Final    Comment: Performed at [facility], [address] [place], [place] [address]     Labs: Basic Metabolic Panel: Recent Labs  Lab 04/15/24 [phone] 04/16/24 0545 04/17/24 1001 04/18/24 0716 04/19/24 0424  NA 138 137 137 138 138  K 3.6 3.0* 2.6* 3.4* 3.9  CL 102 104 102 105 105  CO2 21* 19* 22 20* 20*  GLUCOSE 83 90 110* 94 84  BUN 19 13 7* 8 7*  CREATININE 1.13* 0.89 0.85 0.82 0.85  CALCIUM 9.1 8.8* 9.0 8.9 9.1  MG  --  1.9 1.5* 2.0 1.8  PHOS  --  2.1* 2.1* 2.9 3.1   Liver Function Tests: Recent Labs  Lab 04/14/24 0415 04/16/24 0545 04/17/24 1001 04/18/24 0716 04/19/24 0424  AST 51*  --   --   --   --   ALT 28  --   --   --   --   ALKPHOS 183*  --   --   --   --   BILITOT 1.3*  --   --   --   --   PROT 7.7  --   --   --   --   ALBUMIN 3.7 2.9* 3.2* 3.0* 3.0*   No results for input(s): LIPASE, AMYLASE in the last 168 hours. No results for input(s): AMMONIA in the last 168 hours. CBC: Recent Labs  Lab 04/14/24 0415 04/15/24 0449 04/16/24 0545 04/17/24 1001 04/18/24 0716 04/19/24 0424  WBC 20.1* 16.4* 13.0* 10.6* 12.8* 13.5*  NEUTROABS 15.4*  --  8.1* 6.8 7.6  --   HGB 11.1* 10.7* 9.9* 10.7* 10.5* 10.7*  HCT 34.0* 31.4* 30.7* 32.4* 32.5* 32.0*  MCV 71.4* 70.6* 71.7* 70.4* 72.2* 71.4*  PLT 190 165 173 212 238 251   Cardiac Enzymes: No results for input(s): CKTOTAL, CKMB, CKMBINDEX, TROPONINI in the last 168 hours. BNP: BNP (last 3 results) No results for input(s): BNP in the last 8760 hours.  ProBNP (last 3 results) No results for input(s): PROBNP in the last 8760 hours.  CBG: No results for input(s): GLUCAP in the last 168 hours.     Signed:  Toribio Hummer MD.  [facility] 04/19/2024, 2:36 PM

## 2024-04-21 LAB — CULTURE, BLOOD (ROUTINE X 2)
Culture: NO GROWTH
Culture: NO GROWTH

## 2024-05-22 ENCOUNTER — Ambulatory Visit: Admitting: Podiatry

## 2024-07-11 ENCOUNTER — Encounter: Admitting: Podiatry

## 2024-07-15 NOTE — Progress Notes (Signed)
 This encounter was created in error - please disregard.
